# Patient Record
Sex: Male | Born: 1960
Health system: Southern US, Community
[De-identification: ages and names within clinical notes are randomized; demographics above are authoritative.]

## PROBLEM LIST (undated history)

## (undated) DIAGNOSIS — M1990 Unspecified osteoarthritis, unspecified site: Secondary | ICD-10-CM

## (undated) DIAGNOSIS — E119 Type 2 diabetes mellitus without complications: Secondary | ICD-10-CM

## (undated) DIAGNOSIS — F79 Unspecified intellectual disabilities: Secondary | ICD-10-CM

## (undated) DIAGNOSIS — F319 Bipolar disorder, unspecified: Secondary | ICD-10-CM

## (undated) DIAGNOSIS — S0990XA Unspecified injury of head, initial encounter: Secondary | ICD-10-CM

## (undated) DIAGNOSIS — F329 Major depressive disorder, single episode, unspecified: Secondary | ICD-10-CM

## (undated) DIAGNOSIS — F32A Depression, unspecified: Secondary | ICD-10-CM

## (undated) DIAGNOSIS — J189 Pneumonia, unspecified organism: Secondary | ICD-10-CM

## (undated) HISTORY — PX: BRAIN SURGERY: SHX531

## (undated) HISTORY — DX: Unspecified injury of head, initial encounter: S09.90XA

## (undated) HISTORY — PX: TRACHEOSTOMY: SUR1362

## (undated) HISTORY — PX: HERNIA REPAIR: SHX51

---

## 2004-01-03 ENCOUNTER — Emergency Department (HOSPITAL_COMMUNITY): Admission: EM | Admit: 2004-01-03 | Discharge: 2004-01-03 | Payer: Self-pay | Admitting: Emergency Medicine

## 2004-01-11 ENCOUNTER — Emergency Department (HOSPITAL_COMMUNITY): Admission: EM | Admit: 2004-01-11 | Discharge: 2004-01-11 | Payer: Self-pay | Admitting: Emergency Medicine

## 2004-01-14 ENCOUNTER — Emergency Department (HOSPITAL_COMMUNITY): Admission: EM | Admit: 2004-01-14 | Discharge: 2004-01-14 | Payer: Self-pay | Admitting: *Deleted

## 2004-01-18 ENCOUNTER — Emergency Department (HOSPITAL_COMMUNITY): Admission: EM | Admit: 2004-01-18 | Discharge: 2004-01-19 | Payer: Self-pay | Admitting: *Deleted

## 2004-02-04 ENCOUNTER — Emergency Department (HOSPITAL_COMMUNITY): Admission: EM | Admit: 2004-02-04 | Discharge: 2004-02-04 | Payer: Self-pay | Admitting: Emergency Medicine

## 2004-04-09 DIAGNOSIS — S0990XA Unspecified injury of head, initial encounter: Secondary | ICD-10-CM

## 2004-04-09 HISTORY — DX: Unspecified injury of head, initial encounter: S09.90XA

## 2004-11-08 ENCOUNTER — Emergency Department (HOSPITAL_COMMUNITY): Admission: EM | Admit: 2004-11-08 | Discharge: 2004-11-08 | Payer: Self-pay | Admitting: Emergency Medicine

## 2004-11-14 ENCOUNTER — Ambulatory Visit: Payer: Self-pay | Admitting: Psychiatry

## 2004-11-14 ENCOUNTER — Inpatient Hospital Stay (HOSPITAL_COMMUNITY): Admission: RE | Admit: 2004-11-14 | Discharge: 2004-11-17 | Payer: Self-pay | Admitting: Psychiatry

## 2004-11-26 ENCOUNTER — Emergency Department (HOSPITAL_COMMUNITY): Admission: EM | Admit: 2004-11-26 | Discharge: 2004-11-26 | Payer: Self-pay | Admitting: Emergency Medicine

## 2004-11-29 ENCOUNTER — Inpatient Hospital Stay (HOSPITAL_COMMUNITY): Admission: EM | Admit: 2004-11-29 | Discharge: 2004-11-30 | Payer: Self-pay | Admitting: Emergency Medicine

## 2004-12-06 ENCOUNTER — Inpatient Hospital Stay (HOSPITAL_COMMUNITY): Admission: RE | Admit: 2004-12-06 | Discharge: 2004-12-10 | Payer: Self-pay | Admitting: Orthopedic Surgery

## 2004-12-15 ENCOUNTER — Emergency Department (HOSPITAL_COMMUNITY): Admission: EM | Admit: 2004-12-15 | Discharge: 2004-12-15 | Payer: Self-pay | Admitting: Emergency Medicine

## 2004-12-22 ENCOUNTER — Emergency Department (HOSPITAL_COMMUNITY): Admission: EM | Admit: 2004-12-22 | Discharge: 2004-12-22 | Payer: Self-pay | Admitting: Emergency Medicine

## 2005-02-02 ENCOUNTER — Inpatient Hospital Stay (HOSPITAL_COMMUNITY): Admission: EM | Admit: 2005-02-02 | Discharge: 2005-02-16 | Payer: Self-pay | Admitting: Emergency Medicine

## 2005-03-20 ENCOUNTER — Inpatient Hospital Stay (HOSPITAL_COMMUNITY): Admission: AC | Admit: 2005-03-20 | Discharge: 2005-04-10 | Payer: Self-pay

## 2005-03-20 ENCOUNTER — Ambulatory Visit: Payer: Self-pay | Admitting: Physical Medicine & Rehabilitation

## 2005-03-28 ENCOUNTER — Ambulatory Visit: Payer: Self-pay | Admitting: Plastic Surgery

## 2005-04-10 ENCOUNTER — Ambulatory Visit: Payer: Self-pay | Admitting: Physical Medicine & Rehabilitation

## 2005-04-10 ENCOUNTER — Inpatient Hospital Stay (HOSPITAL_COMMUNITY)
Admission: RE | Admit: 2005-04-10 | Discharge: 2005-05-01 | Payer: Self-pay | Admitting: Physical Medicine & Rehabilitation

## 2005-05-01 ENCOUNTER — Inpatient Hospital Stay (HOSPITAL_COMMUNITY): Admission: AD | Admit: 2005-05-01 | Discharge: 2005-05-14 | Payer: Self-pay | Admitting: Plastic Surgery

## 2005-05-14 ENCOUNTER — Ambulatory Visit: Payer: Self-pay | Admitting: Physical Medicine & Rehabilitation

## 2005-05-14 ENCOUNTER — Inpatient Hospital Stay (HOSPITAL_COMMUNITY)
Admission: RE | Admit: 2005-05-14 | Discharge: 2005-05-24 | Payer: Self-pay | Admitting: Physical Medicine & Rehabilitation

## 2005-11-18 ENCOUNTER — Emergency Department (HOSPITAL_COMMUNITY): Admission: EM | Admit: 2005-11-18 | Discharge: 2005-11-18 | Payer: Self-pay | Admitting: Emergency Medicine

## 2005-11-19 ENCOUNTER — Emergency Department (HOSPITAL_COMMUNITY): Admission: EM | Admit: 2005-11-19 | Discharge: 2005-11-19 | Payer: Self-pay | Admitting: Emergency Medicine

## 2005-12-08 ENCOUNTER — Emergency Department (HOSPITAL_COMMUNITY): Admission: EM | Admit: 2005-12-08 | Discharge: 2005-12-08 | Payer: Self-pay | Admitting: Emergency Medicine

## 2006-06-29 ENCOUNTER — Emergency Department (HOSPITAL_COMMUNITY): Admission: EM | Admit: 2006-06-29 | Discharge: 2006-06-29 | Payer: Self-pay | Admitting: Emergency Medicine

## 2007-04-28 ENCOUNTER — Ambulatory Visit (HOSPITAL_COMMUNITY): Admission: RE | Admit: 2007-04-28 | Discharge: 2007-04-28 | Payer: Self-pay | Admitting: Internal Medicine

## 2007-05-05 ENCOUNTER — Emergency Department (HOSPITAL_COMMUNITY): Admission: EM | Admit: 2007-05-05 | Discharge: 2007-05-05 | Payer: Self-pay | Admitting: Emergency Medicine

## 2010-01-23 ENCOUNTER — Ambulatory Visit (HOSPITAL_COMMUNITY): Admission: RE | Admit: 2010-01-23 | Discharge: 2010-01-23 | Payer: Self-pay | Admitting: Internal Medicine

## 2010-01-23 IMAGING — CR DG CHEST 2V
3 series · 3 of 3 positions shown · non-contrast
Comparison: [DATE]

CLINICAL DATA: Cough, bronchitis.

CHEST - 2 VIEW

[view not recorded (1 of 3)]
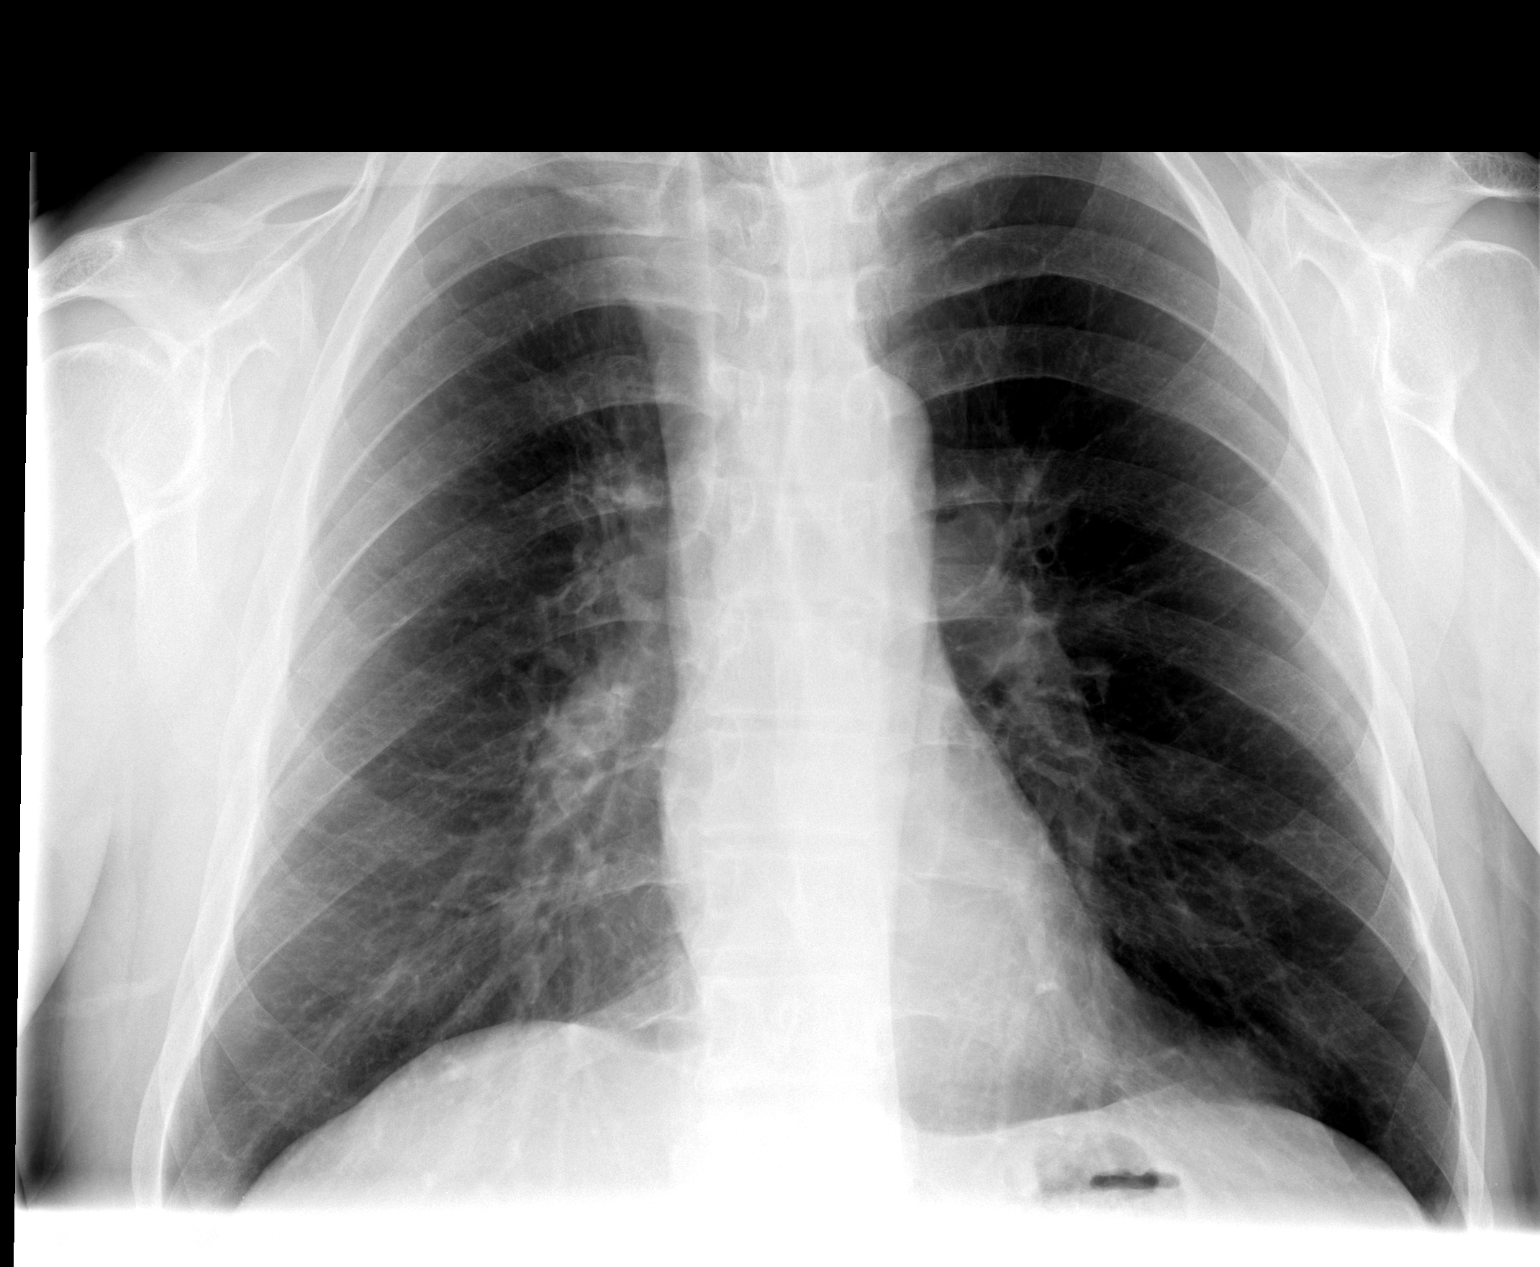

[view not recorded (2 of 3)]
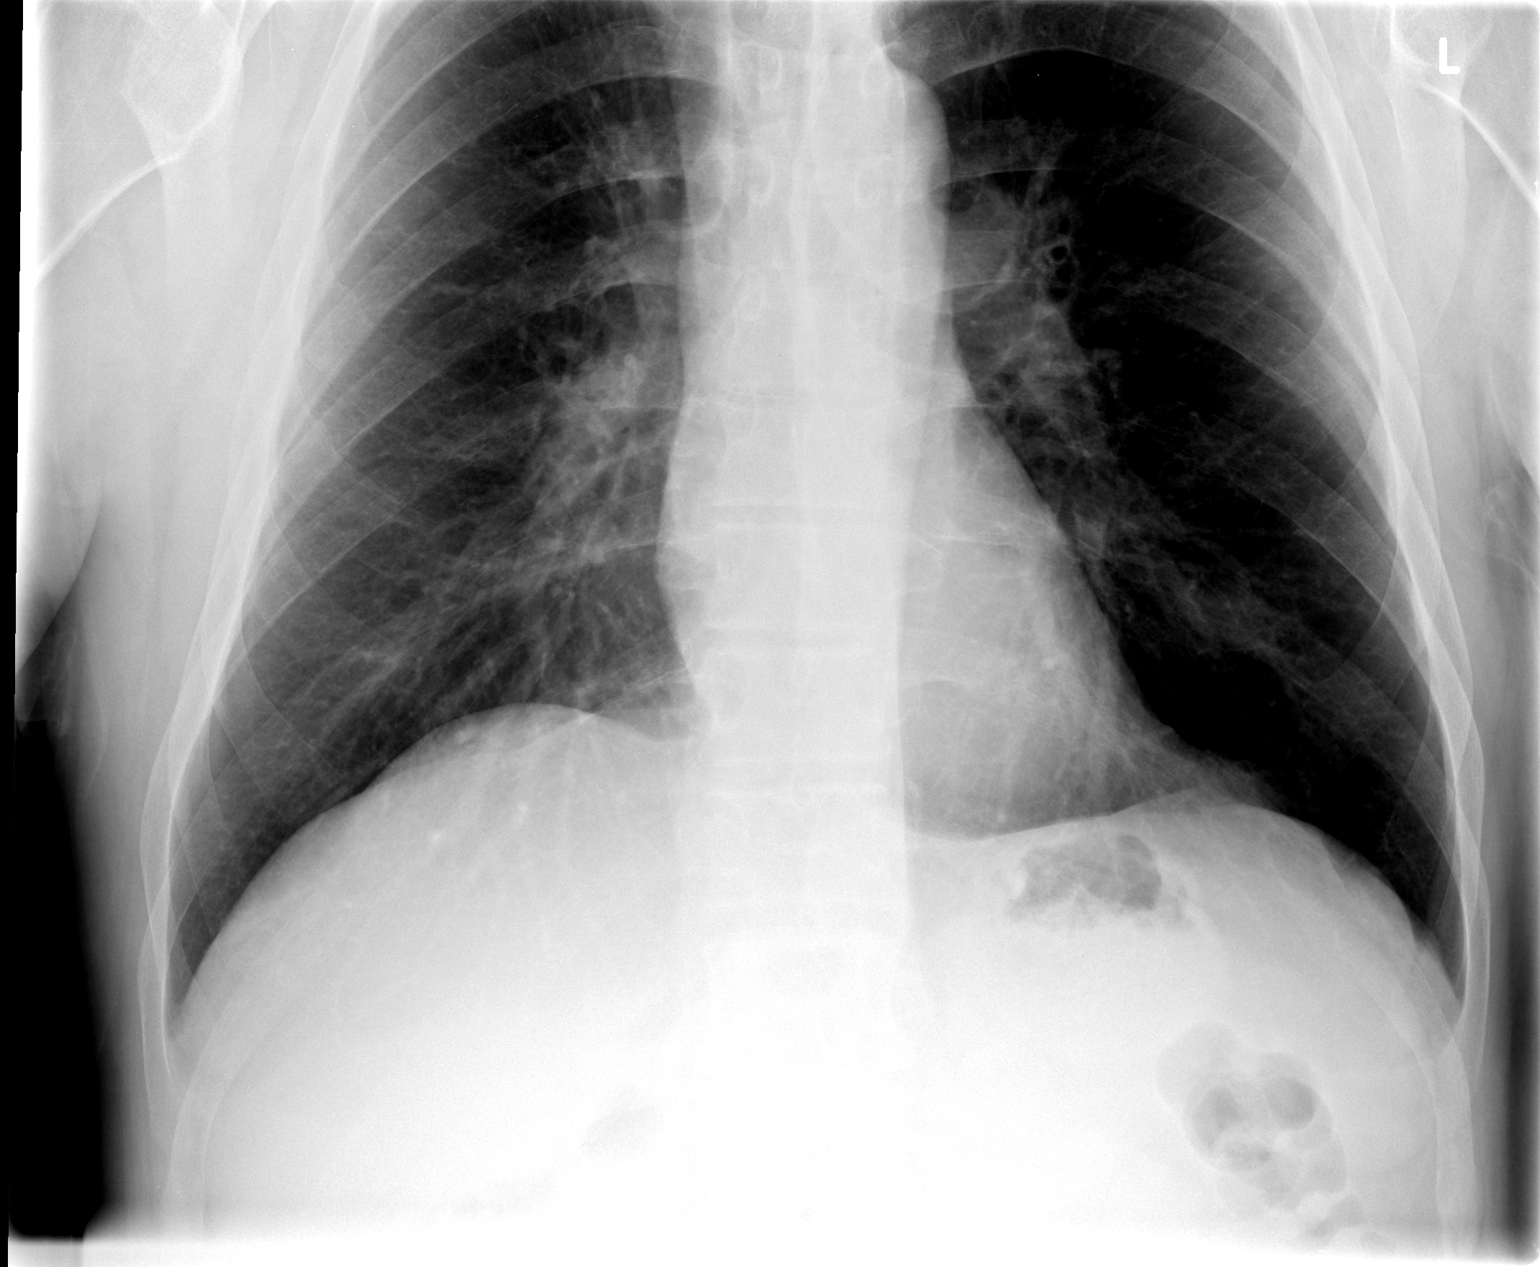

[view not recorded (3 of 3)]
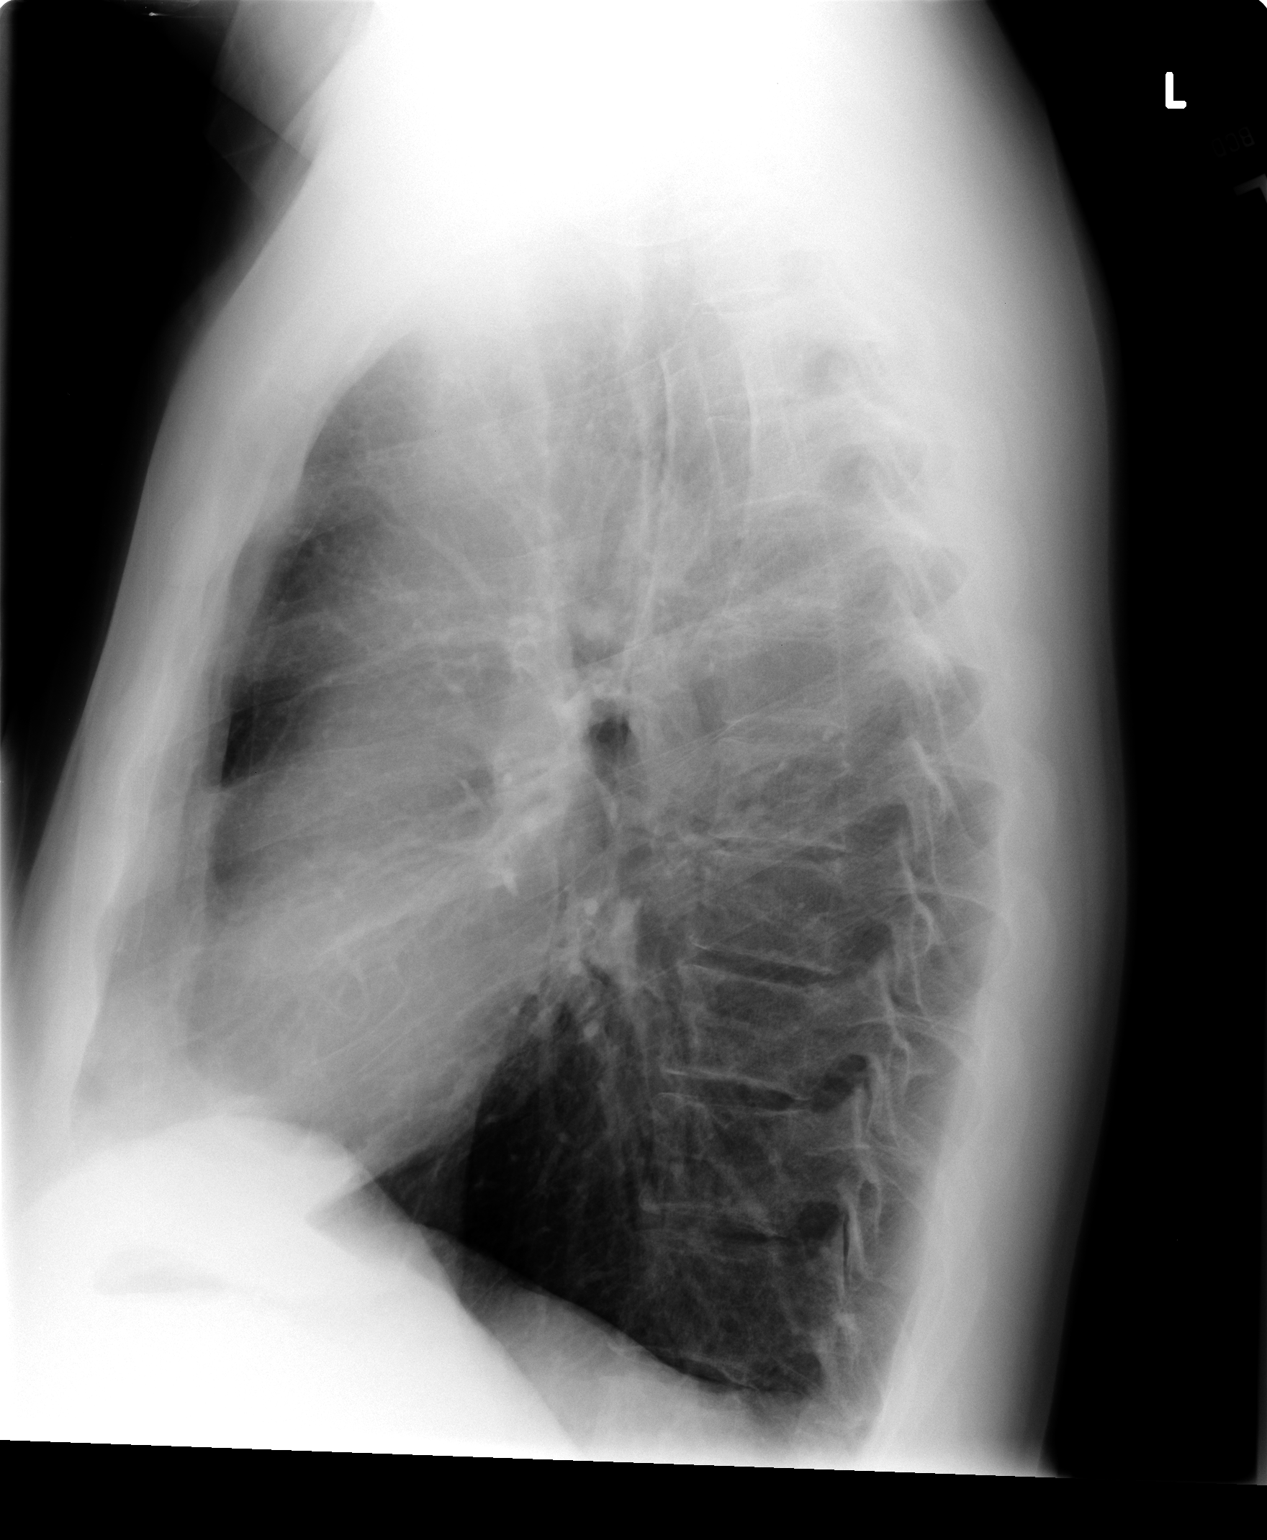

[3 of 3 positions shown; findings below may reference images not displayed]

FINDINGS: Tracheostomy tube no longer visualized.  Lungs are clear.
Heart is normal size.  No effusions or acute bony abnormality.
Scarring noted in the upper lobes bilaterally.
IMPRESSION: No active disease.  Biapical scarring.

## 2010-04-29 ENCOUNTER — Encounter: Payer: Self-pay | Admitting: Surgery

## 2010-04-30 ENCOUNTER — Encounter: Payer: Self-pay | Admitting: Physical Medicine & Rehabilitation
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
## 2010-08-25 NOTE — Op Note (Signed)
NAME:  Scott [name], Scott [name]               ACCOUNT NO.:  [id number]   MEDICAL RECORD NO.:  [id number]          PATIENT TYPE:  INP   LOCATION:  A320                          FACILITY:  [facility]   PHYSICIAN:  J. Darreld Mclean, M.D. DATE OF BIRTH:  [birth year]   DATE OF PROCEDURE:  02/09/2005  DATE OF DISCHARGE:                                 OPERATIVE REPORT   PREOPERATIVE DIAGNOSIS:  Infection, methicillin-resistant staphylococcus  aureus of the right prepatellar knee and right lateral thigh. Wound is  opened. Had been previously packed.   POSTOPERATIVE DIAGNOSIS:  Infection, methicillin-resistant staphylococcus  aureus of the right prepatellar knee and right lateral thigh. Wound is  opened. Had been previously packed.   PROCEDURE:  Irrigation and debridement, right knee prepatellar area and  right lateral thigh. Packing with Betadine-saturated gauze. This procedure  is staged procedure #3.   SURGEON:  J. Darreld Mclean, M.D.   ANESTHESIA:  Spinal.   No tourniquet.   This is the third procedure for methicillin-resistant infection. The patient  has a very serious infection to his right leg and prepatellar area of the  knee. It is prepatellar, it is not in the knee. The wounds today looked  improved. Probably will need one more time and then we can come and close it  next time. Had a very serious infection. He has progressed well with the  vancomycin. It does not appear to be spreading, and as stated, it appears to  look much better today.   DESCRIPTION OF PROCEDURE:  The patient was seen in the holding area.  Identified the right leg and knee as the correct surgical site. I placed a  mark on each site. The patient brought back to the operating room and given  spinal anesthesia. Was placed supine on the operating room table. Was  prepped and draped in the usual manner. Had a time out and again  reidentifying the patient, and then I took the packing that had previously  been removed, and we  used a water pick lavage system and irrigated, this  time only 3 liters of saline between the knee and the right lateral thigh.  The knee looked very well. Much of the redness had gone. We packed both  wounds with Betadine-saturated fine mesh gauze. The prepatellar area had not  as much put in, and I took large #5 Ethilon sutures and tried to bring back  some of the wound together as it had been separated for so long. Sterile  dressings applied. Bulky dressing applied. Ace bandage applied. Kerlix  applied and Ace.   Plan to bring him back on Monday and at that time hopefully we can close the  wounds. It all depends on how he does this weekend and how they look on  Monday. I have explained this to him and his wife.           ______________________________  Shela Commons. Darreld Mclean, M.D.     JWK/MEDQ  D:  02/09/2005  T:  02/09/2005  Job:  [id number]
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
## 2010-08-25 NOTE — Op Note (Signed)
NAME:  Scott [name], Scott [name]               ACCOUNT NO.:  [id number]   MEDICAL RECORD NO.:  [id number]          PATIENT TYPE:  INP   LOCATION:  A320                          FACILITY:  [facility]   PHYSICIAN:  J. Darreld Mclean, M.D. DATE OF BIRTH:  [birth year]   DATE OF PROCEDURE:  02/07/2005  DATE OF DISCHARGE:                                 OPERATIVE REPORT   PREOPERATIVE DIAGNOSIS:  Infection, right lateral thigh, right prepatellar  bursa.   POSTOPERATIVE DIAGNOSIS:  Infection, right lateral thigh, right prepatellar  bursa.   PROCEDURE:  1.  Staged procedure.  2.  Irrigation and debridement of infected areas of the right thigh and      prepatellar bursa, methicillin-resistant staphylococcus aureus.   ANESTHESIA:  Spinal.   SURGEON:  Dr. Hilda Lias.   DRAINS:  No drains.   Water pick was used.   INDICATIONS:  This is the second procedure for [name] for incision and  drainage of his wounds. There are not as red. They were cherry red the other  day, and they are not as red. We saw some discharge. There is still some  slight necrotic tissue present. Overall, is significantly improved and will  need further debridement. He is on IV vancomycin. Cultures have shown  methicillin-resistant staphylococcus aureus. Risks and imponderables of the  procedure were discussed with patient and his family, and they understood  and agreed.   DESCRIPTION OF PROCEDURE:  The patient was seen in the holding area.  Identified the right leg and knee as the correct surgical site. Marks were  placed by me and by the patient. He was brought back to the operating room  and given spinal anesthesia. He was placed supine. He was prepped and draped  in the usual manner. Another time out identified [name] as the patient and  the right leg as the correct surgical site. Previously Betadine impregnated  gauze rolls were removed and then he was irrigated with 6 liters of saline  with a water pick with peroxide in between  3 liter bags. Debridement was  carried out. Wounds were then repacked with fine mesh Betadine gauze.  Sterile dressing and bulky dressing applied. Wounds were of course left  open. I will bring him back on Friday for a third change and probable again  on Monday for the fourth, and maybe at that time, we can try to close the  wounds. He tolerated it well and will resume all other orders that he has  been on.           ______________________________  J. Darreld Mclean, M.D.     JWK/MEDQ  D:  02/07/2005  T:  02/07/2005  Job:  [id number]
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
## 2010-08-25 NOTE — Op Note (Signed)
NAME:  BRINDEN, KINCHELOE               ACCOUNT NO.:  [id number]   MEDICAL RECORD NO.:  [id number]           PATIENT TYPE:  INP   LOCATION:  A320                          FACILITY:  [facility]   PHYSICIAN:  J. Darreld Mclean, M.D. DATE OF BIRTH:  Oct 11, 1960   DATE OF PROCEDURE:  02/12/2005  DATE OF DISCHARGE:                                 OPERATIVE REPORT   PREOPERATIVE DIAGNOSIS:  Infection right lateral thigh, right prepatellar  area with methicillin-resistant Staphylococcus aureus.   POSTOPERATIVE DIAGNOSIS:  Infection right lateral thigh, right prepatellar  area with methicillin-resistant Staphylococcus aureus.   PROCEDURE:  Cleansing of wound and closure of wound right lateral thigh and  right prepatellar bursa area.   ANESTHESIA:  Spinal.   SURGEON:  J. Darreld Mclean, M.D.   INDICATIONS:  The patient is a 50 year old male who has had a significant  and severe infection of his right lateral thigh and prepatellar area which  has shown MRSA.  I have taken him to surgery 3 previous times for incision  and drainage, irrigation, and debridement.  Today I am bringing him back for  possible closure of the wound.  The risks and imponderables have been  discussed with the patient and his family.  He has been on IV vancomycin and  he has tolerated that well.  He has remained afebrile.  The wounds have  improved.   DESCRIPTION OF PROCEDURE:  The patient was seen in the holding area.  The  right thigh and knee were identified as the correct surgical sites.  I  placed a mark on these areas and so did the patient. The patient was brought  back to the operating room and given a spinal; then placed supine.  The  previously applied packings were removed.  The wounds looked very good  without any erythema or redness.  There was no odor.  The edges of the skin  looked good.  I elected, at this time, to close the wound.  It had been  prepped and draped.  I, again, reidentified the patient and the right  thigh  and knee as the correct surgical sites.   The wounds looked very good; and I elected to close the lateral thigh wound,  but first I cleaned it out and then I placed a Hemovac drain in and sewed it  with 2-0 silk.  The wound was reapproximated using 2-0 plain skin staples.  The patella was a jagged wound and I used some dental rolls with #2 Nylon  suture and then I did a running 3-0  Prolene suture for the skin edges.  There was one area of the skin on the  most lateral side that was jagged, and a separate wound and I closed this as  best as possible.  Sterile dressings were then applied.  The patient  tolerated the procedure well and went to recovery in good condition.           ______________________________  Shela Commons. Darreld Mclean, M.D.     JWK/MEDQ  D:  02/12/2005  T:  02/12/2005  Job:  [id number]

## 2010-08-25 NOTE — Discharge Summary (Signed)
NAME:  Scott [name], Scott [name]               ACCOUNT NO.:  [id number]   MEDICAL RECORD NO.:  [id number]          PATIENT TYPE:  INP   LOCATION:  A320                          FACILITY:  [facility]   PHYSICIAN:  J. Darreld Mclean, M.D. DATE OF BIRTH:  05-22-1960   DATE OF ADMISSION:  02/02/2005  DATE OF DISCHARGE:  11/10/2006LH                                 DISCHARGE SUMMARY   DISCHARGE DIAGNOSIS:  Cellulitis infection of the right prepatellar area and  the right lateral thigh.   CONDITION ON DISCHARGE:  Improved.  Prognosis good.   DISPOSITION:  To home.   DISCHARGE MEDICATIONS:  Vicodin ES one every 4 hours as needed for pain.   FOLLOW UP:  I will see him in the office on Monday, February 19, 2005.   WOUND CARE:  Keep the wound dry.   PROCEDURES:  Irrigation and drainage and irrigation and debridement of the  right prepatellar area and incision and drainage of right lateral thigh on  February 05, 2005.  These were repeated again on November 1, November 3 and  repeated again on November 6.   HISTORY OF PRESENT ILLNESS:  The patient was seen by me in the emergency  room on day of admission.  He is a 50 year old male who came in complaining  of pus coming out of his prepatellar area of his right knee.  It had been  coming out for several days.  The patient with serious, extremely poor,  unkempt hygiene and overall appearance.  He has a history of bipolar and  personality disorder and is seen in mental health by Dr. Thomasena Edis.  The  patient has multiple history of mental problems and has been hospitalized  several times at [facility].  The patient has Medicare and Social Security  Disability.  He had recently had a left lower knee surgery and seen by Dr.  Lequita Halt approximately 10 days to 2 weeks prior to this admission.   HOSPITAL COURSE:  The patient was admitted and begun on IV antibiotics.  It  was obvious that the wound needed debridement and would not improve.  He had  an elevated white  count and I had him seen by the hospitalist making sure he  was medically stable and he underwent the above-mentioned procedures.  The  first procedure was on October 30, which he tolerated well.  I packed the  wound and left it opened.  His antibiotics were controlled and regulated  with hospitalists and pharmacist.  He was taken back to surgery on November  1, and tolerated that well.  He was taken back to surgery on November 3, in  which he tolerated well.  The wound continued to improve.  He was taken back  on November 6, when it was much improved, looking much better.  He had a  Hemovac drain that was removed on November 8.  Arrangements were made for  discharge.  I had social services for adults to see him at home and arranged  home health.  Instructions and care were given.   LABORATORY DATA AND X-RAY FINDINGS:  Elevated  white count on admission and  slowly decreased by the time of discharge.   SPECIAL INSTRUCTIONS:  He was kept on his multiple medications for his  psychiatric condition.  He is to continue at home.  If there are any  difficulties, contact me through the office hospital beeper system.                                            ______________________________  Shela Commons. Darreld Mclean, M.D.     JWK/MEDQ  D:  03/12/2005  T:  03/12/2005  Job:  [id number]
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
## 2010-08-25 NOTE — Op Note (Signed)
NAME:  Scott [name], Scott [name]               ACCOUNT NO.:  [id number]   MEDICAL RECORD NO.:  [id number]          PATIENT TYPE:  INP   LOCATION:  A320                          FACILITY:  [facility]   PHYSICIAN:  J. Darreld Mclean, M.D. DATE OF BIRTH:  [birth year]   DATE OF PROCEDURE:  DATE OF DISCHARGE:                                 OPERATIVE REPORT   PREOPERATIVE DIAGNOSES:  1.  Septic right prepatellar bursa.  2.  Infection, right thigh, methicillin-resistant staphylococcus aureus      infection.   POSTOPERATIVE DIAGNOSES:  1.  Septic right prepatellar bursa.  2.  Infection, right thigh, methicillin-resistant staphylococcus aureus      infection.   PROCEDURE:  1.  Stage incision and drainage, irrigation and debridement, right      prepatellar bursa.  2.  Incision and drainage, right lateral thigh, packed open with Betadine      gauze.   SURGEON:  J. Darreld Mclean, M.D.   ANESTHESIA:  Spinal.   COMPLICATIONS:  None apparent.   BLOOD LOSS:  Minimal.   INDICATIONS:  Patient is a 50 year old male who presented to the ER with  infected prepatellar bursa that has been present for several days.  Has had  irrigation and debridement in the ER of that area.  Has since developed an  area on his lateral thigh.  Aspirated that.  It is purulent.  Cultures have  come back Staphylococcus aureus, methicillin-resistant.  Sensitive to  gentamicin, rifampin, and vancomycin.  Clindamycin cultures were not  available yet.   I have talked to the patient.  He has very, very poor hygiene.  I have  talked to his wife.  Recommended surgery.  I told him he is going to need  several procedures to get control of the infection.  This is the first of  several procedures of stage procedure.  He is at high risk.   Patient has psychiatric problems as well.   DESCRIPTION OF PROCEDURE:  The patient was seen in the holding area.  The  right knee and thigh were identified as the correct surgical sites.  Marks  were  placed on these.  The patient placed the marks.  The patient was  brought back to the operating room.  He was given spinal anesthesia.  Prepped and draped in the usual manner.  At the time of identifying the  patient as the correct patient and the right leg and knee as the correct  surgical site.  The leg was elevated.  It was prepped and draped.  I made an  incision over the prepatellar bursa and opened this up.  I took out a  tremendous amount of purulent material and just nasty, necrotic tissue.  I  used the water pick and used 3 liters of saline with the water pick and  irrigated this area out.  We packed the wound with fine mesh Betadine gauze.  Attention was directed to the lateral thigh.  He had a significant amount of  pus here, I would say a good 100 cc of purulent material, nasty, necrotic  tissue.  It  did not go past the fascial layers between the subcu and the  fascia of the tensor fascia lata.  I cleaned this area out, irrigated it,  and used a water pick.  Used 3 liters of saline.  I packed it with fine mesh  and Betadine gauze.  The wounds were left open.  Both of them were packed,  reinforced, and then had Kerlix and an Ace bandage applied loosely.  He will  be brought back to surgery on Wednesday for a repeat procedure.  He will  probably have another repeat procedure on Friday and maybe again next week.  Continue IV vancomycin.  Will continue the MRSA precautions.           ______________________________  Shela Commons. Darreld Mclean, M.D.     JWK/MEDQ  D:  02/05/2005  T:  02/05/2005  Job:  [id number]
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
## 2010-12-28 LAB — BASIC METABOLIC PANEL
BUN: 19
CO2: 25
Calcium: 8.4
Chloride: 101
Creatinine, Ser: 1.37
GFR calc non Af Amer: 56 — ABNORMAL LOW
Glucose, Bld: 108 — ABNORMAL HIGH
Potassium: 3.9

## 2010-12-28 LAB — DIFFERENTIAL
Eosinophils Relative: 0
Lymphocytes Relative: 27
Monocytes Absolute: 0.7

## 2010-12-28 LAB — URINALYSIS, ROUTINE W REFLEX MICROSCOPIC
Bilirubin Urine: NEGATIVE
Glucose, UA: NEGATIVE
Leukocytes, UA: NEGATIVE
Specific Gravity, Urine: 1.025
Urobilinogen, UA: 0.2
pH: 6

## 2010-12-28 LAB — CBC
Hemoglobin: 13.2
MCV: 92.4
RBC: 4.09 — ABNORMAL LOW
RDW: 14.8

## 2011-04-10 DIAGNOSIS — J189 Pneumonia, unspecified organism: Secondary | ICD-10-CM

## 2011-04-10 HISTORY — DX: Pneumonia, unspecified organism: J18.9

## 2011-07-17 ENCOUNTER — Encounter (HOSPITAL_COMMUNITY): Payer: Self-pay | Admitting: General Practice

## 2011-07-17 ENCOUNTER — Inpatient Hospital Stay (HOSPITAL_COMMUNITY)
Admission: AD | Admit: 2011-07-17 | Discharge: 2011-07-23 | DRG: 195 | Disposition: A | Discharge status: Nursing Home (Includes ICF) | Payer: Medicare Other | Source: Ambulatory Visit | Attending: Internal Medicine | Admitting: Internal Medicine

## 2011-07-17 ENCOUNTER — Other Ambulatory Visit (HOSPITAL_COMMUNITY): Payer: Self-pay | Admitting: Internal Medicine

## 2011-07-17 ENCOUNTER — Ambulatory Visit (HOSPITAL_COMMUNITY)
Admission: RE | Admit: 2011-07-17 | Discharge: 2011-07-17 | Disposition: A | Discharge status: Home / Self Care | Payer: Medicare Other | Source: Ambulatory Visit | Attending: Internal Medicine | Admitting: Internal Medicine

## 2011-07-17 DIAGNOSIS — F79 Unspecified intellectual disabilities: Secondary | ICD-10-CM | POA: Diagnosis present

## 2011-07-17 DIAGNOSIS — J40 Bronchitis, not specified as acute or chronic: Secondary | ICD-10-CM

## 2011-07-17 DIAGNOSIS — F17200 Nicotine dependence, unspecified, uncomplicated: Secondary | ICD-10-CM | POA: Diagnosis present

## 2011-07-17 DIAGNOSIS — J449 Chronic obstructive pulmonary disease, unspecified: Secondary | ICD-10-CM | POA: Diagnosis present

## 2011-07-17 DIAGNOSIS — Z7982 Long term (current) use of aspirin: Secondary | ICD-10-CM

## 2011-07-17 DIAGNOSIS — M1990 Unspecified osteoarthritis, unspecified site: Secondary | ICD-10-CM | POA: Diagnosis present

## 2011-07-17 DIAGNOSIS — J4489 Other specified chronic obstructive pulmonary disease: Secondary | ICD-10-CM | POA: Diagnosis present

## 2011-07-17 DIAGNOSIS — Z79899 Other long term (current) drug therapy: Secondary | ICD-10-CM

## 2011-07-17 DIAGNOSIS — J189 Pneumonia, unspecified organism: Principal | ICD-10-CM | POA: Diagnosis present

## 2011-07-17 DIAGNOSIS — F319 Bipolar disorder, unspecified: Secondary | ICD-10-CM | POA: Diagnosis present

## 2011-07-17 HISTORY — DX: Depression, unspecified: F32.A

## 2011-07-17 HISTORY — DX: Unspecified osteoarthritis, unspecified site: M19.90

## 2011-07-17 HISTORY — DX: Bipolar disorder, unspecified: F31.9

## 2011-07-17 HISTORY — DX: Unspecified intellectual disabilities: F79

## 2011-07-17 HISTORY — DX: Major depressive disorder, single episode, unspecified: F32.9

## 2011-07-17 LAB — URINALYSIS, ROUTINE W REFLEX MICROSCOPIC
Glucose, UA: 250 mg/dL — AB
Leukocytes, UA: NEGATIVE
pH: 6 (ref 5.0–8.0)

## 2011-07-17 LAB — CBC
Hemoglobin: 13.6 g/dL (ref 13.0–17.0)
MCV: 95.8 fL (ref 78.0–100.0)
Platelets: 143 10*3/uL — ABNORMAL LOW (ref 150–400)
RBC: 4.26 MIL/uL (ref 4.22–5.81)
WBC: 5.1 10*3/uL (ref 4.0–10.5)

## 2011-07-17 LAB — BASIC METABOLIC PANEL
CO2: 25 mEq/L (ref 19–32)
Chloride: 102 mEq/L (ref 96–112)
Creatinine, Ser: 1.1 mg/dL (ref 0.50–1.35)
Glucose, Bld: 138 mg/dL — ABNORMAL HIGH (ref 70–99)
Sodium: 138 mEq/L (ref 135–145)

## 2011-07-17 LAB — URINE MICROSCOPIC-ADD ON

## 2011-07-17 MED ORDER — DEXTROSE 5 % IV SOLN
1.0000 g | INTRAVENOUS | Status: DC
  Administered 2011-07-17 – 2011-07-22 (×6): 1 g via INTRAVENOUS
  Filled 2011-07-17 (×8): qty 10

## 2011-07-17 MED ORDER — NIACIN ER 500 MG PO TBCR
500.0000 mg | EXTENDED_RELEASE_TABLET | Freq: Every day | ORAL | Status: DC
  Administered 2011-07-17 – 2011-07-22 (×6): 500 mg via ORAL
  Filled 2011-07-17 (×8): qty 1

## 2011-07-17 MED ORDER — DIVALPROEX SODIUM ER 500 MG PO TB24
500.0000 mg | ORAL_TABLET | Freq: Every day | ORAL | Status: DC
  Administered 2011-07-18 – 2011-07-23 (×6): 500 mg via ORAL
  Filled 2011-07-17 (×6): qty 1

## 2011-07-17 MED ORDER — ALBUTEROL SULFATE (5 MG/ML) 0.5% IN NEBU
2.5000 mg | INHALATION_SOLUTION | RESPIRATORY_TRACT | Status: DC
  Administered 2011-07-17 – 2011-07-18 (×5): 2.5 mg via RESPIRATORY_TRACT
  Filled 2011-07-17 (×5): qty 0.5

## 2011-07-17 MED ORDER — ENOXAPARIN SODIUM 40 MG/0.4ML ~~LOC~~ SOLN
40.0000 mg | SUBCUTANEOUS | Status: DC
  Administered 2011-07-17 – 2011-07-22 (×6): 40 mg via SUBCUTANEOUS
  Filled 2011-07-17 (×7): qty 0.4

## 2011-07-17 MED ORDER — ASPIRIN EC 81 MG PO TBEC
81.0000 mg | DELAYED_RELEASE_TABLET | Freq: Every day | ORAL | Status: DC
  Administered 2011-07-18 – 2011-07-23 (×6): 81 mg via ORAL
  Filled 2011-07-17 (×6): qty 1

## 2011-07-17 MED ORDER — IPRATROPIUM BROMIDE 0.02 % IN SOLN
0.5000 mg | RESPIRATORY_TRACT | Status: DC
  Administered 2011-07-17 – 2011-07-18 (×5): 0.5 mg via RESPIRATORY_TRACT
  Filled 2011-07-17 (×5): qty 2.5

## 2011-07-17 MED ORDER — RISPERIDONE 1 MG PO TABS
1.0000 mg | ORAL_TABLET | Freq: Every day | ORAL | Status: DC
  Administered 2011-07-17 – 2011-07-22 (×6): 1 mg via ORAL
  Filled 2011-07-17 (×6): qty 1

## 2011-07-17 MED ORDER — DEXTROSE 5 % IV SOLN
500.0000 mg | INTRAVENOUS | Status: DC
  Administered 2011-07-17 – 2011-07-20 (×4): 500 mg via INTRAVENOUS
  Filled 2011-07-17 (×7): qty 500

## 2011-07-18 MED ORDER — ALBUTEROL SULFATE (5 MG/ML) 0.5% IN NEBU
2.5000 mg | INHALATION_SOLUTION | Freq: Three times a day (TID) | RESPIRATORY_TRACT | Status: DC
  Administered 2011-07-18 – 2011-07-23 (×14): 2.5 mg via RESPIRATORY_TRACT
  Filled 2011-07-18 (×15): qty 0.5

## 2011-07-18 MED ORDER — ALBUTEROL SULFATE (5 MG/ML) 0.5% IN NEBU: 2.5000 mg | INHALATION_SOLUTION | RESPIRATORY_TRACT | Status: DC

## 2011-07-18 MED ORDER — IPRATROPIUM BROMIDE 0.02 % IN SOLN
0.5000 mg | Freq: Three times a day (TID) | RESPIRATORY_TRACT | Status: DC
  Administered 2011-07-18 – 2011-07-23 (×14): 0.5 mg via RESPIRATORY_TRACT
  Filled 2011-07-18 (×14): qty 2.5

## 2011-07-18 MED ORDER — IPRATROPIUM BROMIDE 0.02 % IN SOLN: 0.5000 mg | Freq: Three times a day (TID) | RESPIRATORY_TRACT | Status: DC

## 2011-07-18 NOTE — Progress Notes (Signed)
UR Chart Review Completed  

## 2011-07-18 NOTE — Progress Notes (Signed)
Scott [name], Scott [name]               ACCOUNT NO.:  [id number]  MEDICAL RECORD NO.:  [id number]  LOCATION:  A341                          FACILITY:  [facility]  PHYSICIAN:  Ceola Para D. Felecia Shelling, MD   DATE OF BIRTH:  Feb 23, 1961  DATE OF PROCEDURE:  07/18/2011 DATE OF DISCHARGE:                                PROGRESS NOTE   SUBJECTIVE:  The patient feels slightly better today.  His cough and congestion is less.  No fever or chills.  OBJECTIVE:  GENERAL:  The patient is alert, awake, and sick looking. VITAL SIGNS:  Blood pressure 108/72, pulse 94, respiratory rate 16, temperature 98.3 degrees Fahrenheit. CHEST:  Poor air entry bilaterally, expiratory wheezes and rhonchi. CARDIOVASCULAR SYSTEM:  First and second heart sound heard.  No murmur. No gallop. ABDOMEN:  Soft and lax.  Bowel sound is positive.  No mass or organomegaly.  EXTREMITIES:  No leg edema.  ASSESSMENT: 1. Community-acquired pneumonia. 2. Possibly chronic obstructive pulmonary disease. 3. Bipolar disease.  PLAN:  We will continue the patient on IV antibiotics.  Continue nebulizer treatment.  Continue current treatment and supportive care.     Noelie Renfrow D. Felecia Shelling, MD     TDF/MEDQ  D:  07/18/2011  T:  07/18/2011  Job:  [id number]

## 2011-07-18 NOTE — H&P (Signed)
Scott [name], Scott [name]               ACCOUNT NO.:  [id number]  MEDICAL RECORD NO.:  [id number]  LOCATION:  A341                          FACILITY:  [facility]  PHYSICIAN:  Mantaj Chamberlin D. Felecia Shelling, MD   DATE OF BIRTH:  [birth year]  DATE OF ADMISSION:  07/17/2011 DATE OF DISCHARGE:  LH                             HISTORY & PHYSICAL   CHIEF COMPLAINT:  Cough, congestion, and shortness of breath.  HISTORY OF PRESENT ILLNESS:  This is a 51 year old male patient with history of mental retardation, bipolar disease, and depression disorder. We is currently a resident of [facility], brought to my office due to cough, congestion, and shortness of breath.  The patient smokes about 1 pack of cigarettes daily.  During the evaluation, the patient was found to have expiratory wheezes and poor air entry.  Chest x-ray was ordered which showed patchy infiltrates with a dense right upper lobe consolidation.  The patient was then directly admitted as a case of community-acquired pneumonia and possible acute exacerbation of chronic obstructive pulmonary disease.  He is started on IV antibiotics and nebulizer treatment.  REVIEW OF SYSTEMS:  The patient feels weak and his appetite is poor.  He had no fever or chills.  No chest pain, nausea, vomiting, abdominal pain, dysuria, urgency or frequency of urination.  PAST MEDICAL HISTORY: 1. Mental retardation. 2. Bipolar disease. 3. Depression disorder. 4. Osteoarthritis.  CURRENT MEDICATIONS: 1. Aspirin 81 mg daily. 2. Depakote 500 mg daily. 3. Niaspan 500 mg daily. 4. Risperdal 1 mg daily. 5. Albuterol inhaler daily. 6. Robitussin cough syrup p.r.n.  SOCIAL HISTORY:  The patient is currently a resident of [facility].  He smokes about 1 pack of cigarettes daily.  No history of alcohol or substance abuse.  FAMILY HISTORY:  Not available at this time.  PHYSICAL EXAMINATION:  GENERAL:  The patient is alert, awake, and acutely sick  looking. VITAL SIGNS:  Blood pressure 122/76, pulse 101, respiratory rate 22, temperature 98.9 degrees Fahrenheit. HEENT:  Pupils are equal and reactive. NECK:  Supple. CHEST:  Poor air entry.  Bilateral expiratory wheezes and rhonchi. CARDIOVASCULAR:  First and second heart sounds heard.  No murmur.  No gallop. ABDOMEN:  Soft and lax.  Bowel sound is positive.  No mass or organomegaly. EXTREMITIES:  No leg edema.  LABORATORY DATA:  WBC 5.4, hemoglobin 13.6, hematocrit 40.8, and platelet 143.  BMP:  Sodium 138, potassium 4.0, chloride 102, carbon dioxide 25, glucose 138, BUN 20, creatinine 1.0, and calcium 9.3.  ASSESSMENT: 1. Community-acquired pneumonia. 2. Chronic obstructive pulmonary disease. 3. History of tobacco abuse. 4. Bipolar disease. 5. History of depression disorder.  PLAN:  We will continue the patient on combination IV antibiotics.  We will continue nebulizer treatment.  Continue regular medications.     Nannette Zill D. Felecia Shelling, MD     TDF/MEDQ  D:  07/18/2011  T:  07/18/2011  Job:  [id number]

## 2011-07-19 NOTE — Progress Notes (Signed)
   CARE MANAGEMENT NOTE 07/19/2011  Patient:  SIR, MALLIS   Account Number:  [id number]  Date Initiated:  07/19/2011  Documentation initiated by:  Sharrie Rothman  Subjective/Objective Assessment:   Pt admitted from [facility]. Pt has a guardian at [facility] DSS- Cecilio Asper. Pt will return to [facility].     Action/Plan:   CM will monitor for any HH or discharge needs. CSW is aware that pt is here and will arrange discharge back to facility when pt is medically ready for discharge.   Anticipated DC Date:  07/24/2011   Anticipated DC Plan:  ASSISTED LIVING / REST HOME  In-house referral  Clinical Social Worker      DC Planning Services  CM consult      Choice offered to / List presented to:             Status of service:  In process, will continue to follow Medicare Important Message given?   (If response is "NO", the following Medicare IM given date fields will be blank) Date Medicare IM given:   Date Additional Medicare IM given:    Discharge Disposition:  ASSISTED LIVING  Per UR Regulation:    If discussed at Long Length of Stay Meetings, dates discussed:    Comments:  07/19/11 1406 Arlyss Queen, RN BSN CM

## 2011-07-19 NOTE — Progress Notes (Signed)
NAMETYRION, GLAUDE               ACCOUNT NO.:  [id number]  MEDICAL RECORD NO.:  [id number]  LOCATION:                                 FACILITY:  PHYSICIAN:  Parneet Glantz D. Felecia Shelling, MD   DATE OF BIRTH:  Oct 18, 1960  DATE OF PROCEDURE:  07/19/2011 DATE OF DISCHARGE:                                PROGRESS NOTE   SUBJECTIVE:  The patient is feeling slightly better.  His cough and congestion is improving.  No fever or chills.  OBJECTIVE:  GENERAL:  The patient is alert, awake, and chronically sick looking. VITAL SIGNS:  Blood pressure 95/62, pulse 86, respiratory rate 20, temperature 97 degrees Fahrenheit. CHEST:  Poor air entry, bilateral rhonchi and crackles. CARDIOVASCULAR SYSTEM:  First and second heart sounds heard.  No murmur. No gallop. ABDOMEN:  Soft and lax.  Bowel sound is positive.  No mass or organomegaly. EXTREMITIES:  No leg edema.  ASSESSMENT: 1. Community-acquired pneumonia. 2. Chronic obstructive pulmonary disease. 3. History of tobacco abuse. 4. Bipolar disease.  PLAN:  We will continue the patient on IV antibiotics.  Continue nebulizer treatment.  Continue current treatment and supportive care.     Natale Barba D. Felecia Shelling, MD     TDF/MEDQ  D:  07/19/2011  T:  07/19/2011  Job:  [id number]

## 2011-07-20 LAB — CBC
HCT: 40.8 % (ref 39.0–52.0)
Hemoglobin: 13.4 g/dL (ref 13.0–17.0)
RBC: 4.23 MIL/uL (ref 4.22–5.81)
RDW: 13.4 % (ref 11.5–15.5)
WBC: 6.2 10*3/uL (ref 4.0–10.5)

## 2011-07-20 LAB — BASIC METABOLIC PANEL
Chloride: 108 mEq/L (ref 96–112)
Creatinine, Ser: 0.81 mg/dL (ref 0.50–1.35)
GFR calc Af Amer: >90 mL/min (ref >90)
Potassium: 4.6 mEq/L (ref 3.5–5.1)
Sodium: 142 mEq/L (ref 135–145)

## 2011-07-20 NOTE — Progress Notes (Signed)
NAMEKHAMARION, BJELLAND               ACCOUNT NO.:  [id number]  MEDICAL RECORD NO.:  [id number]  LOCATION:  A341                          FACILITY:  [facility]  PHYSICIAN:  Race Latour D. Felecia Shelling, MD   DATE OF BIRTH:  [birth year]  DATE OF PROCEDURE:  07/20/2011 DATE OF DISCHARGE:                                PROGRESS NOTE   SUBJECTIVE:  The patient is still coughing and wheezing.  However, he is feeling better than before.  No fever or chills.  OBJECTIVE:  GENERAL:  The patient is alert, awake, and sick looking. VITAL SIGNS:  Blood pressure 125/86 pulse 85, respiratory rate 20, temperature 97.5 degrees Fahrenheit. CHEST:  Decreased air entry, few rhonchi. CARDIOVASCULAR SYSTEM:  First and second heart sounds heard.  No murmur. No gallop. ABDOMEN:  Soft and lax.  Bowel sound is positive.  No mass or organomegaly. EXTREMITIES:  No leg edema.  LABS:  Sodium 142, potassium 4.6, chloride 108, carbon dioxide 26, glucose 123, BUN 11, creatinine 0.81, calcium 9.2.  CBC:  WBC 6.2, hemoglobin 13.4, hematocrit 40.8, and platelets 176.  ASSESSMENT: 1. Community-acquired pneumonia. 2. Chronic obstructive pulmonary disease. 3. Mental retardation. 4. Bipolar disease.  PLAN:  We will continue the patient on IV antibiotics.  Continue nebulizer treatment.  Continue current treatment and supportive care.     Huie Ghuman D. Felecia Shelling, MD     TDF/MEDQ  D:  07/20/2011  T:  07/20/2011  Job:  [id number]

## 2011-07-20 NOTE — Progress Notes (Signed)
Clinical Social Work Department BRIEF PSYCHOSOCIAL ASSESSMENT 07/20/2011  Patient:  Scott [name], Scott [name]     Account Number:  [id number]     Admit date:  07/17/2011  Clinical Social Worker:  Robin Searing  Date/Time:  07/20/2011 12:20 PM  Referred by:  Physician  Date Referred:  07/19/2011 Referred for  ALF Placement   Other Referral:   Interview type:  Other - See comment Other interview type:    PSYCHOSOCIAL DATA Living Status:  FACILITY Admitted from facility:  [facility] Level of care:  Assisted Living Primary support name:  guardian/DSS Primary support relationship to patient:   Degree of support available:    CURRENT CONCERNS Current Concerns  Post-Acute Placement   Other Concerns:    SOCIAL WORK ASSESSMENT / PLAN Patient admitted from [facility] ALF- anticipate return at d/c.   Assessment/plan status:  Other - See comment Other assessment/ plan:   Patient admitted from [facility]F- anticipate return at d/c.   Information/referral to community resources:    PATIENT'S/FAMILY'S RESPONSE TO PLAN OF CARE: Guardian/DSS agree with plans for return to ALF at d/c.      Reece Levy, MSW, LCSWA [phone]

## 2011-07-21 MED ORDER — AZITHROMYCIN 250 MG PO TABS
500.0000 mg | ORAL_TABLET | Freq: Every day | ORAL | Status: DC
  Administered 2011-07-21 – 2011-07-23 (×3): 500 mg via ORAL
  Filled 2011-07-21 (×3): qty 2

## 2011-07-21 NOTE — Progress Notes (Signed)
PHARMACIST - PHYSICIAN COMMUNICATION DR:   Felecia Shelling CONCERNING: Antibiotic IV to Oral Route Change Policy  RECOMMENDATION: This patient is receiving Zithromax by the intravenous route.  Based on criteria approved by the Pharmacy and Therapeutics Committee, the antibiotic(s) is/are being converted to the equivalent oral dose form(s).   DESCRIPTION: These criteria include:  Patient being treated for a respiratory tract infection, urinary tract infection, or cellulitis  The patient is not neutropenic and does not exhibit a GI malabsorption state  The patient is eating (either orally or via tube) and/or has been taking other orally administered medications for a least 24 hours  The patient is improving clinically and has a Tmax < 100.5  If you have questions about this conversion, please contact the Pharmacy Department  [x]   [phone] )  [facility] []   [phone] )  [facility]  []   [phone] )  [facility] []   [phone] )  [facility]  Mady Gemma, [facility]  07/21/2011 2:11 PM

## 2011-07-22 NOTE — Progress Notes (Signed)
NAMEHENRIQUE, PAREKH               ACCOUNT NO.:  [id number]  MEDICAL RECORD NO.:  [id number]  LOCATION:  A341                          FACILITY:  [facility]  PHYSICIAN:  Braxxton Stoudt L. Juanetta Gosling, M.D.DATE OF BIRTH:  25-May-1960  DATE OF PROCEDURE: DATE OF DISCHARGE:                                PROGRESS NOTE   The patient of Dr. Felecia Shelling.  Mr. Scott [name] is admitted with pneumonia/COPD.  He also has mental retardation.  He seems to be better and says he feels better.  He has no other new complaints.  Exam shows that he is awake and alert.  He looks relatively comfortable.  His chest is with some rhonchi.  Heart is regular.  Abdomen is soft.  Extremities showed no edema.  Central nervous system exam is grossly intact.  Assessment then is that he is better.  He seems to be improving on a daily basis.  PLAN:  No change in treatment.  He will continue his current meds and follow.  I do not think he is quite ready for discharge yet.     Jekhi Bolin L. Juanetta Gosling, M.D.     ELH/MEDQ  D:  07/21/2011  T:  07/22/2011  Job:  [id number]

## 2011-07-22 NOTE — Progress Notes (Signed)
Per MD note- patient is not yet ready for d/c. Plan return to [facility] when stable per MD. Signed Fl2 placed in front of chart.   Darylene Price, BSW,07/22/2011 11:16 AM

## 2011-07-23 LAB — CULTURE, BLOOD (ROUTINE X 2): Culture: NO GROWTH

## 2011-07-23 MED ORDER — AMOXICILLIN-POT CLAVULANATE 500-125 MG PO TABS: 1.0000 | ORAL_TABLET | Freq: Three times a day (TID) | ORAL | Status: AC

## 2011-07-23 NOTE — Progress Notes (Signed)
[facility] staff here to pick up patient.  Assisted patient to door by nurse tech.  Schonewitz, Candelaria Stagers 07/23/2011

## 2011-07-23 NOTE — Progress Notes (Signed)
Clinical Social Work:  Following for patient who is from an ALF and will return today.  Spoke with Lupita Leash at [facility] [employer].  Agreeable for patient to return today and will pick patient up/provide transportation between 12:30-1:00pm.  Patient aware of discharge and also spoke with patient's guardian: Juliette Alcide who was made aware of discharge and was agreeable.  No other needs at this time. Will sign off.  Ashley Jacobs, MSW LCSW [phone]

## 2011-07-23 NOTE — Progress Notes (Signed)
NAMEMATTHAN, SLEDGE               ACCOUNT NO.:  [id number]  MEDICAL RECORD NO.:  [id number]  LOCATION:                                 FACILITY:  PHYSICIAN:  Bindu Docter L. Juanetta Gosling, M.D.DATE OF BIRTH:  06-16-1960  DATE OF PROCEDURE:  07/22/2011 DATE OF DISCHARGE:                                PROGRESS NOTE   Patient of Dr. Letitia Neri.  Scott [name] has pneumonia and has mental retardation.  He seems to be doing better.  He has no new complaints.  PHYSICAL EXAMINATION:  GENERAL:  He is awake and alert, looks comfortable. CHEST:  Much clearer. HEART:  Regular. ABDOMEN:  Soft. EXTREMITIES:  No edema.  ASSESSMENT:  He is improving.  PLAN:  I suspect he will probably be able to go home tomorrow.  I did not change any of his medicines today.     Dede Dobesh L. Juanetta Gosling, M.D.     ELH/MEDQ  D:  07/22/2011  T:  07/23/2011  Job:  [id number]

## 2011-07-23 NOTE — Discharge Summary (Signed)
Scott [name], Scott [name]               ACCOUNT NO.:  [id number]  MEDICAL RECORD NO.:  [id number]  LOCATION:  A340                          FACILITY:  [facility]  PHYSICIAN:  Caprisha Bridgett D. Felecia Shelling, MD   DATE OF BIRTH:  [birth year]  DATE OF ADMISSION:  07/17/2011 DATE OF DISCHARGE:  04/15/2013LH                              DISCHARGE SUMMARY   DISCHARGE DIAGNOSES: 1. Community-acquired pneumonia. 2. Chronic obstructive pulmonary disease. 3. History of bipolar disease. 4. History of tobacco abuse. 5. Depression disorder.  DISCHARGE MEDICATIONS: 1. Augmentin 500 mg p.o. t.i.d. for 5 more days. 2. Albuterol inhaler 2 puffs q.4 hours p.r.n. 3. Aspirin 81 mg daily. 4. Depakote 500 mg b.i.d. 5. Niacin 500 mg b.i.d. 6. Risperidone 1 mg daily.  DISPOSITION:  The patient will be discharged back to [facility].  DISCHARGE INSTRUCTION:  The patient will continue oral antibiotics.  He will be followed in the office in 1-week duration.  LABORATORY DATA ON DISCHARGE:  BMP:  Sodium 142, potassium 4.6 chloride 108, carbon dioxide 26, sodium 123, BUN 11, creatinine 0.8, and calcium 8.2.  CBC:  WBC 6.2, hemoglobin 13.4, hematocrit 40.8, and platelets 176.  HOSPITAL COURSE:  This is a 51 year old male patient who was a resident of [facility], was admitted due to pneumonia.  He was seen in the office due to cough and congestion.  His chest x-ray showed consolidation of his right lung.  He was also known case of bronchitis with ongoing tobacco use.  He was also treated as superimposed to chronic obstructive pulmonary disease.  The patient received a combination of IV antibiotics.  Over the hospital stay, the patient improved.  His respiratory symptoms have resolved. The patient will be discharged to rest home on oral antibiotics and will be followed in the office in 1-week duration.     Khoury Siemon D. Felecia Shelling, MD     TDF/MEDQ  D:  07/23/2011  T:  07/23/2011  Job:  [id number]

## 2011-07-23 NOTE — Progress Notes (Signed)
   CARE MANAGEMENT NOTE 07/23/2011  Patient:  Scott [name], Scott [name]   Account Number:  [id number]  Date Initiated:  07/19/2011  Documentation initiated by:  Sharrie Rothman  Subjective/Objective Assessment:   Pt admitted from [facility]. Pt has a guardian at [facility] DSS- Cecilio Asper. Pt will return to [facility].     Action/Plan:   CM will monitor for any HH or discharge needs. CSW is aware that pt is here and will arrange discharge back to facility when pt is medically ready for discharge.   Anticipated DC Date:  07/24/2011   Anticipated DC Plan:  ASSISTED LIVING / REST HOME  In-house referral  Clinical Social Worker      DC Planning Services  CM consult      Choice offered to / List presented to:             Status of service:  Completed, signed off Medicare Important Message given?   (If response is "NO", the following Medicare IM given date fields will be blank) Date Medicare IM given:   Date Additional Medicare IM given:    Discharge Disposition:  ASSISTED LIVING  Per UR Regulation:    If discussed at Long Length of Stay Meetings, dates discussed:    Comments:  07/23/11 1318 Arlyss Queen, RN BSN CM Pt discharged back to [facility]. No CM needs noted at this time. CSW is aware and is arranging transfer back to facility. Pt nurse aware of discharge arrangement. 07/19/11 1406 Arlyss Queen, RN BSN CM

## 2011-11-26 ENCOUNTER — Other Ambulatory Visit (HOSPITAL_COMMUNITY): Payer: Self-pay | Admitting: Internal Medicine

## 2011-11-26 ENCOUNTER — Ambulatory Visit (HOSPITAL_COMMUNITY)
Admission: RE | Admit: 2011-11-26 | Discharge: 2011-11-26 | Disposition: A | Discharge status: Home / Self Care | Payer: Medicare Other | Source: Ambulatory Visit | Attending: Internal Medicine | Admitting: Internal Medicine

## 2011-11-26 DIAGNOSIS — J42 Unspecified chronic bronchitis: Secondary | ICD-10-CM | POA: Insufficient documentation

## 2011-11-26 DIAGNOSIS — R05 Cough: Secondary | ICD-10-CM | POA: Insufficient documentation

## 2011-11-26 DIAGNOSIS — R059 Cough, unspecified: Secondary | ICD-10-CM | POA: Insufficient documentation

## 2011-11-26 DIAGNOSIS — F17200 Nicotine dependence, unspecified, uncomplicated: Secondary | ICD-10-CM | POA: Insufficient documentation

## 2011-11-26 DIAGNOSIS — J40 Bronchitis, not specified as acute or chronic: Secondary | ICD-10-CM

## 2012-01-17 ENCOUNTER — Other Ambulatory Visit (HOSPITAL_COMMUNITY): Payer: Self-pay | Admitting: Internal Medicine

## 2012-01-17 DIAGNOSIS — R109 Unspecified abdominal pain: Secondary | ICD-10-CM

## 2012-01-18 ENCOUNTER — Ambulatory Visit (HOSPITAL_COMMUNITY)
Admission: RE | Admit: 2012-01-18 | Discharge: 2012-01-18 | Disposition: A | Discharge status: Home / Self Care | Payer: Medicare Other | Source: Ambulatory Visit | Attending: Internal Medicine | Admitting: Internal Medicine

## 2012-01-18 DIAGNOSIS — R109 Unspecified abdominal pain: Secondary | ICD-10-CM | POA: Insufficient documentation

## 2013-11-19 ENCOUNTER — Ambulatory Visit (HOSPITAL_COMMUNITY)
Admission: RE | Admit: 2013-11-19 | Discharge: 2013-11-19 | Disposition: A | Discharge status: Home / Self Care | Payer: Medicare Other | Source: Ambulatory Visit | Attending: Internal Medicine | Admitting: Internal Medicine

## 2013-11-19 ENCOUNTER — Other Ambulatory Visit (HOSPITAL_COMMUNITY): Payer: Self-pay | Admitting: Internal Medicine

## 2013-11-19 DIAGNOSIS — M545 Low back pain, unspecified: Secondary | ICD-10-CM | POA: Insufficient documentation

## 2014-05-05 DIAGNOSIS — E1165 Type 2 diabetes mellitus with hyperglycemia: Secondary | ICD-10-CM | POA: Diagnosis not present

## 2014-05-05 DIAGNOSIS — J449 Chronic obstructive pulmonary disease, unspecified: Secondary | ICD-10-CM | POA: Diagnosis not present

## 2014-05-13 DIAGNOSIS — L110 Acquired keratosis follicularis: Secondary | ICD-10-CM | POA: Diagnosis not present

## 2014-05-13 DIAGNOSIS — E1142 Type 2 diabetes mellitus with diabetic polyneuropathy: Secondary | ICD-10-CM | POA: Diagnosis not present

## 2014-05-13 DIAGNOSIS — L609 Nail disorder, unspecified: Secondary | ICD-10-CM | POA: Diagnosis not present

## 2014-05-13 DIAGNOSIS — E1140 Type 2 diabetes mellitus with diabetic neuropathy, unspecified: Secondary | ICD-10-CM | POA: Diagnosis not present

## 2014-06-03 DIAGNOSIS — J449 Chronic obstructive pulmonary disease, unspecified: Secondary | ICD-10-CM | POA: Diagnosis not present

## 2014-06-03 DIAGNOSIS — E1165 Type 2 diabetes mellitus with hyperglycemia: Secondary | ICD-10-CM | POA: Diagnosis not present

## 2014-07-05 DIAGNOSIS — J449 Chronic obstructive pulmonary disease, unspecified: Secondary | ICD-10-CM | POA: Diagnosis not present

## 2014-07-05 DIAGNOSIS — E1165 Type 2 diabetes mellitus with hyperglycemia: Secondary | ICD-10-CM | POA: Diagnosis not present

## 2014-07-14 DIAGNOSIS — H25813 Combined forms of age-related cataract, bilateral: Secondary | ICD-10-CM | POA: Diagnosis not present

## 2014-07-14 DIAGNOSIS — E119 Type 2 diabetes mellitus without complications: Secondary | ICD-10-CM | POA: Diagnosis not present

## 2014-07-29 DIAGNOSIS — L110 Acquired keratosis follicularis: Secondary | ICD-10-CM | POA: Diagnosis not present

## 2014-07-29 DIAGNOSIS — E1142 Type 2 diabetes mellitus with diabetic polyneuropathy: Secondary | ICD-10-CM | POA: Diagnosis not present

## 2014-07-29 DIAGNOSIS — E1140 Type 2 diabetes mellitus with diabetic neuropathy, unspecified: Secondary | ICD-10-CM | POA: Diagnosis not present

## 2014-07-29 DIAGNOSIS — L609 Nail disorder, unspecified: Secondary | ICD-10-CM | POA: Diagnosis not present

## 2014-08-06 DIAGNOSIS — E1165 Type 2 diabetes mellitus with hyperglycemia: Secondary | ICD-10-CM | POA: Diagnosis not present

## 2014-08-06 DIAGNOSIS — J449 Chronic obstructive pulmonary disease, unspecified: Secondary | ICD-10-CM | POA: Diagnosis not present

## 2014-09-05 DIAGNOSIS — J449 Chronic obstructive pulmonary disease, unspecified: Secondary | ICD-10-CM | POA: Diagnosis not present

## 2014-09-05 DIAGNOSIS — E1165 Type 2 diabetes mellitus with hyperglycemia: Secondary | ICD-10-CM | POA: Diagnosis not present

## 2014-09-20 DIAGNOSIS — J449 Chronic obstructive pulmonary disease, unspecified: Secondary | ICD-10-CM | POA: Diagnosis not present

## 2014-09-20 DIAGNOSIS — E1165 Type 2 diabetes mellitus with hyperglycemia: Secondary | ICD-10-CM | POA: Diagnosis not present

## 2014-09-24 DIAGNOSIS — E1165 Type 2 diabetes mellitus with hyperglycemia: Secondary | ICD-10-CM | POA: Diagnosis not present

## 2014-09-24 DIAGNOSIS — I10 Essential (primary) hypertension: Secondary | ICD-10-CM | POA: Diagnosis not present

## 2014-10-13 DIAGNOSIS — L609 Nail disorder, unspecified: Secondary | ICD-10-CM | POA: Diagnosis not present

## 2014-10-13 DIAGNOSIS — E1142 Type 2 diabetes mellitus with diabetic polyneuropathy: Secondary | ICD-10-CM | POA: Diagnosis not present

## 2014-10-13 DIAGNOSIS — L110 Acquired keratosis follicularis: Secondary | ICD-10-CM | POA: Diagnosis not present

## 2014-10-13 DIAGNOSIS — E1140 Type 2 diabetes mellitus with diabetic neuropathy, unspecified: Secondary | ICD-10-CM | POA: Diagnosis not present

## 2014-10-28 DIAGNOSIS — Z0000 Encounter for general adult medical examination without abnormal findings: Secondary | ICD-10-CM | POA: Diagnosis not present

## 2014-10-28 DIAGNOSIS — J449 Chronic obstructive pulmonary disease, unspecified: Secondary | ICD-10-CM | POA: Diagnosis not present

## 2014-10-28 DIAGNOSIS — E1165 Type 2 diabetes mellitus with hyperglycemia: Secondary | ICD-10-CM | POA: Diagnosis not present

## 2014-11-28 DIAGNOSIS — J449 Chronic obstructive pulmonary disease, unspecified: Secondary | ICD-10-CM | POA: Diagnosis not present

## 2014-11-28 DIAGNOSIS — E1165 Type 2 diabetes mellitus with hyperglycemia: Secondary | ICD-10-CM | POA: Diagnosis not present

## 2014-12-17 DIAGNOSIS — J449 Chronic obstructive pulmonary disease, unspecified: Secondary | ICD-10-CM | POA: Diagnosis not present

## 2014-12-17 DIAGNOSIS — Z23 Encounter for immunization: Secondary | ICD-10-CM | POA: Diagnosis not present

## 2014-12-23 DIAGNOSIS — J449 Chronic obstructive pulmonary disease, unspecified: Secondary | ICD-10-CM | POA: Diagnosis not present

## 2014-12-23 DIAGNOSIS — E1165 Type 2 diabetes mellitus with hyperglycemia: Secondary | ICD-10-CM | POA: Diagnosis not present

## 2014-12-29 DIAGNOSIS — L609 Nail disorder, unspecified: Secondary | ICD-10-CM | POA: Diagnosis not present

## 2014-12-29 DIAGNOSIS — L110 Acquired keratosis follicularis: Secondary | ICD-10-CM | POA: Diagnosis not present

## 2014-12-29 DIAGNOSIS — E1142 Type 2 diabetes mellitus with diabetic polyneuropathy: Secondary | ICD-10-CM | POA: Diagnosis not present

## 2014-12-29 DIAGNOSIS — E1140 Type 2 diabetes mellitus with diabetic neuropathy, unspecified: Secondary | ICD-10-CM | POA: Diagnosis not present

## 2014-12-30 ENCOUNTER — Telehealth: Payer: Self-pay

## 2014-12-30 NOTE — Telephone Encounter (Signed)
Scott [name] from [facility] called to start the triage process for pt to have colonoscoy. She will fax list of meds and copies of insurance cards to me.

## 2015-01-06 NOTE — Telephone Encounter (Signed)
Info received. Pt has been scheduled an OV with Neil Crouch, PA on 01/27/2015 at 11:30 Am.

## 2015-01-10 NOTE — Telephone Encounter (Signed)
I called [place] [employer] and [name] is pt's Education officer, museum. She can come with him to his appt here on 01/27/2015 at 11:30 AM with Neil Crouch, PA. She is aware the facility will bring the pt and she just needs to meet him here.  She will be able to go with him to procedure.  Stacy at [facility] is aware the social worker will meet the pt here.

## 2015-01-22 DIAGNOSIS — E1165 Type 2 diabetes mellitus with hyperglycemia: Secondary | ICD-10-CM | POA: Diagnosis not present

## 2015-01-22 DIAGNOSIS — J449 Chronic obstructive pulmonary disease, unspecified: Secondary | ICD-10-CM | POA: Diagnosis not present

## 2015-01-27 ENCOUNTER — Other Ambulatory Visit: Payer: Self-pay

## 2015-01-27 ENCOUNTER — Encounter: Payer: Self-pay | Admitting: Gastroenterology

## 2015-01-27 ENCOUNTER — Ambulatory Visit (INDEPENDENT_AMBULATORY_CARE_PROVIDER_SITE_OTHER): Payer: Medicare Other | Admitting: Gastroenterology

## 2015-01-27 VITALS — BP 122/77 | HR 87 | Temp 97.9°F | Ht 74.0 in | Wt 210.4 lb

## 2015-01-27 DIAGNOSIS — Z7984 Long term (current) use of oral hypoglycemic drugs: Secondary | ICD-10-CM | POA: Diagnosis not present

## 2015-01-27 DIAGNOSIS — K760 Fatty (change of) liver, not elsewhere classified: Secondary | ICD-10-CM | POA: Insufficient documentation

## 2015-01-27 DIAGNOSIS — K824 Cholesterolosis of gallbladder: Secondary | ICD-10-CM

## 2015-01-27 DIAGNOSIS — E119 Type 2 diabetes mellitus without complications: Secondary | ICD-10-CM | POA: Diagnosis not present

## 2015-01-27 DIAGNOSIS — Z79899 Other long term (current) drug therapy: Secondary | ICD-10-CM | POA: Diagnosis not present

## 2015-01-27 DIAGNOSIS — Z1211 Encounter for screening for malignant neoplasm of colon: Secondary | ICD-10-CM | POA: Diagnosis not present

## 2015-01-27 MED ORDER — BISACODYL 5 MG PO TBEC: 5.0000 mg | DELAYED_RELEASE_TABLET | Freq: Every day | ORAL | Status: DC | PRN

## 2015-01-27 MED ORDER — PEG 3350-KCL-NA BICARB-NACL 420 G PO SOLR: 4000.0000 mL | Freq: Once | ORAL | Status: DC

## 2015-01-27 MED ORDER — PHOSPHATE LAXATIVE 2.7-7.2 GM/15ML PO SOLN: 15.0000 mL | Freq: Once | ORAL | Status: DC

## 2015-01-27 NOTE — Assessment & Plan Note (Signed)
Surveillance ultrasound for gallbladder polyp. Await preop labs.

## 2015-01-27 NOTE — Assessment & Plan Note (Signed)
Surveillance ultrasound

## 2015-01-27 NOTE — Patient Instructions (Addendum)
Colonoscopy as scheduled. See separate instructions.   Please have abdominal ultrasound done to follow up on fatty liver and gallbladder polyps.

## 2015-01-27 NOTE — Assessment & Plan Note (Signed)
Screening colonoscopy in the near future. Plan for deep sedation given polypharmacy. Patient has a history of prior tracheostomy.  I have discussed the risks, alternatives, benefits with regards to but not limited to the risk of reaction to medication, bleeding, infection, perforation and the patient is agreeable to proceed. Written consent to be obtained.
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
## 2015-01-27 NOTE — Progress Notes (Signed)
CC'D TO PCP °

## 2015-02-04 ENCOUNTER — Other Ambulatory Visit: Payer: Self-pay | Admitting: Gastroenterology

## 2015-02-04 ENCOUNTER — Ambulatory Visit (HOSPITAL_COMMUNITY)
Admission: RE | Admit: 2015-02-04 | Discharge: 2015-02-04 | Disposition: A | Discharge status: Home / Self Care | Payer: Medicare Other | Source: Ambulatory Visit | Attending: Gastroenterology | Admitting: Gastroenterology

## 2015-02-04 DIAGNOSIS — K760 Fatty (change of) liver, not elsewhere classified: Secondary | ICD-10-CM | POA: Diagnosis not present

## 2015-02-04 DIAGNOSIS — K824 Cholesterolosis of gallbladder: Secondary | ICD-10-CM

## 2015-02-08 NOTE — Patient Instructions (Signed)
Scott [name]  02/08/2015     @PREFPERIOPPHARMACY @   Your procedure is scheduled on 02/14/2015.  Report to [facility] at 8:30 A.M.  Call this number if you have problems the morning of surgery:  [phone]   Remember:  Do not eat food or drink liquids after midnight.  Take these medicines the morning of surgery with A SIP OF WATER Albuterol and bring with you, Depakote, Lisinopril, Tramadol   Do not wear jewelry, make-up or nail polish.  Do not wear lotions, powders, or perfumes.  You may wear deodorant.  Do not shave 48 hours prior to surgery.  Men may shave face and neck.  Do not bring valuables to the hospital.  [facility] is not responsible for any belongings or valuables.  Contacts, dentures or bridgework may not be worn into surgery.  Leave your suitcase in the car.  After surgery it may be brought to your room.  For patients admitted to the hospital, discharge time will be determined by your treatment team.  Patients discharged the day of surgery will not be allowed to drive home.    Please read over the following fact sheets that you were given. Anesthesia Post-op Instructions     PATIENT INSTRUCTIONS POST-ANESTHESIA  IMMEDIATELY FOLLOWING SURGERY:  Do not drive or operate machinery for the first twenty four hours after surgery.  Do not make any important decisions for twenty four hours after surgery or while taking narcotic pain medications or sedatives.  If you develop intractable nausea and vomiting or a severe headache please notify your doctor immediately.  FOLLOW-UP:  Please make an appointment with your surgeon as instructed. You do not need to follow up with anesthesia unless specifically instructed to do so.  WOUND CARE INSTRUCTIONS (if applicable):  Keep a dry clean dressing on the anesthesia/puncture wound site if there is drainage.  Once the wound has quit draining you may leave it open to air.  Generally you should leave the bandage intact for  twenty four hours unless there is drainage.  If the epidural site drains for more than 36-48 hours please call the anesthesia department.  QUESTIONS?:  Please feel free to call your physician or the hospital operator if you have any questions, and they will be happy to assist you.      Colonoscopy A colonoscopy is an exam to look at the entire large intestine (colon). This exam can help find problems such as tumors, polyps, inflammation, and areas of bleeding. The exam takes about 1 hour.  LET [facility] CARE PROVIDER KNOW ABOUT:   Any allergies you have.  All medicines you are taking, including vitamins, herbs, eye drops, creams, and over-the-counter medicines.  Previous problems you or members of your family have had with the use of anesthetics.  Any blood disorders you have.  Previous surgeries you have had.  Medical conditions you have. RISKS AND COMPLICATIONS  Generally, this is a safe procedure. However, as with any procedure, complications can occur. Possible complications include:  Bleeding.  Tearing or rupture of the colon wall.  Reaction to medicines given during the exam.  Infection (rare). BEFORE THE PROCEDURE   Ask your health care provider about changing or stopping your regular medicines.  You may be prescribed an oral bowel prep. This involves drinking a large amount of medicated liquid, starting the day before your procedure. The liquid will cause you to have multiple loose stools until your stool is almost clear or light green. This  cleans out your colon in preparation for the procedure.  Do not eat or drink anything else once you have started the bowel prep, unless your health care provider tells you it is safe to do so.  Arrange for someone to drive you home after the procedure. PROCEDURE   You will be given medicine to help you relax (sedative).  You will lie on your side with your knees bent.  A long, flexible tube with a light and camera on the end  (colonoscope) will be inserted through the rectum and into the colon. The camera sends video back to a computer screen as it moves through the colon. The colonoscope also releases carbon dioxide gas to inflate the colon. This helps your health care provider see the area better.  During the exam, your health care provider may take a small tissue sample (biopsy) to be examined under a microscope if any abnormalities are found.  The exam is finished when the entire colon has been viewed. AFTER THE PROCEDURE   Do not drive for 24 hours after the exam.  You may have a small amount of blood in your stool.  You may pass moderate amounts of gas and have mild abdominal cramping or bloating. This is caused by the gas used to inflate your colon during the exam.  Ask when your test results will be ready and how you will get your results. Make sure you get your test results.   This information is not intended to replace advice given to you by your health care provider. Make sure you discuss any questions you have with your health care provider.   Document Released: 03/23/2000 Document Revised: 01/14/2013 Document Reviewed: 12/01/2012 Elsevier Interactive Patient Education [employer].

## 2015-02-10 ENCOUNTER — Other Ambulatory Visit: Payer: Self-pay

## 2015-02-10 ENCOUNTER — Encounter (HOSPITAL_COMMUNITY)
Admission: RE | Admit: 2015-02-10 | Discharge: 2015-02-10 | Disposition: A | Discharge status: Home / Self Care | Payer: Medicare Other | Source: Ambulatory Visit | Attending: Internal Medicine | Admitting: Internal Medicine

## 2015-02-10 ENCOUNTER — Encounter (HOSPITAL_COMMUNITY): Payer: Self-pay

## 2015-02-10 DIAGNOSIS — Z01818 Encounter for other preprocedural examination: Secondary | ICD-10-CM | POA: Insufficient documentation

## 2015-02-10 HISTORY — DX: Type 2 diabetes mellitus without complications: E11.9

## 2015-02-10 HISTORY — DX: Pneumonia, unspecified organism: J18.9

## 2015-02-10 LAB — BASIC METABOLIC PANEL
ANION GAP: 10 (ref 5–15)
BUN: 22 mg/dL — ABNORMAL HIGH (ref 6–20)
CHLORIDE: 103 mmol/L (ref 101–111)
CO2: 25 mmol/L (ref 22–32)
Calcium: 9.5 mg/dL (ref 8.9–10.3)
Creatinine, Ser: 1.36 mg/dL — ABNORMAL HIGH (ref 0.61–1.24)
GFR calc Af Amer: >60 mL/min (ref >60)
GFR calc non Af Amer: 58 mL/min — ABNORMAL LOW (ref >60)
GLUCOSE: 120 mg/dL — AB (ref 65–99)
POTASSIUM: 4.8 mmol/L (ref 3.5–5.1)
Sodium: 138 mmol/L (ref 135–145)

## 2015-02-10 LAB — CBC WITH DIFFERENTIAL/PLATELET
BASOS ABS: 0 10*3/uL (ref 0.0–0.1)
BASOS PCT: 0 %
EOS PCT: 2 %
Eosinophils Absolute: 0.2 10*3/uL (ref 0.0–0.7)
HEMATOCRIT: 43.8 % (ref 39.0–52.0)
Hemoglobin: 14.4 g/dL (ref 13.0–17.0)
LYMPHS PCT: 39 %
Lymphs Abs: 4.8 10*3/uL — ABNORMAL HIGH (ref 0.7–4.0)
MCH: 31.2 pg (ref 26.0–34.0)
MCHC: 32.9 g/dL (ref 30.0–36.0)
MCV: 94.8 fL (ref 78.0–100.0)
MONO ABS: 0.7 10*3/uL (ref 0.1–1.0)
Monocytes Relative: 6 %
NEUTROS ABS: 6.5 10*3/uL (ref 1.7–7.7)
Neutrophils Relative %: 53 %
Platelets: 259 10*3/uL (ref 150–400)
RBC: 4.62 MIL/uL (ref 4.22–5.81)
RDW: 13.9 % (ref 11.5–15.5)
WBC: 12.2 10*3/uL — AB (ref 4.0–10.5)

## 2015-02-10 LAB — SURGICAL PCR SCREEN
MRSA, PCR: NEGATIVE
Staphylococcus aureus: NEGATIVE

## 2015-02-14 ENCOUNTER — Encounter (HOSPITAL_COMMUNITY)
Admission: RE | Disposition: A | Discharge status: Home / Self Care | Payer: Self-pay | Source: Ambulatory Visit | Attending: Internal Medicine

## 2015-02-14 ENCOUNTER — Ambulatory Visit (HOSPITAL_COMMUNITY): Payer: Medicare Other | Admitting: Anesthesiology

## 2015-02-14 ENCOUNTER — Encounter (HOSPITAL_COMMUNITY): Payer: Self-pay | Admitting: *Deleted

## 2015-02-14 ENCOUNTER — Ambulatory Visit (HOSPITAL_COMMUNITY)
Admission: RE | Admit: 2015-02-14 | Discharge: 2015-02-14 | Disposition: A | Discharge status: Home / Self Care | Payer: Medicare Other | Source: Ambulatory Visit | Attending: Internal Medicine | Admitting: Internal Medicine

## 2015-02-14 DIAGNOSIS — K5730 Diverticulosis of large intestine without perforation or abscess without bleeding: Secondary | ICD-10-CM | POA: Diagnosis not present

## 2015-02-14 DIAGNOSIS — K635 Polyp of colon: Secondary | ICD-10-CM | POA: Diagnosis not present

## 2015-02-14 DIAGNOSIS — K621 Rectal polyp: Secondary | ICD-10-CM | POA: Diagnosis not present

## 2015-02-14 DIAGNOSIS — F79 Unspecified intellectual disabilities: Secondary | ICD-10-CM | POA: Diagnosis not present

## 2015-02-14 DIAGNOSIS — D125 Benign neoplasm of sigmoid colon: Secondary | ICD-10-CM | POA: Insufficient documentation

## 2015-02-14 DIAGNOSIS — Z7982 Long term (current) use of aspirin: Secondary | ICD-10-CM | POA: Diagnosis not present

## 2015-02-14 DIAGNOSIS — F17210 Nicotine dependence, cigarettes, uncomplicated: Secondary | ICD-10-CM | POA: Insufficient documentation

## 2015-02-14 DIAGNOSIS — Z800 Family history of malignant neoplasm of digestive organs: Secondary | ICD-10-CM | POA: Insufficient documentation

## 2015-02-14 DIAGNOSIS — Z1211 Encounter for screening for malignant neoplasm of colon: Secondary | ICD-10-CM | POA: Diagnosis not present

## 2015-02-14 DIAGNOSIS — Z79899 Other long term (current) drug therapy: Secondary | ICD-10-CM | POA: Insufficient documentation

## 2015-02-14 DIAGNOSIS — F329 Major depressive disorder, single episode, unspecified: Secondary | ICD-10-CM | POA: Insufficient documentation

## 2015-02-14 DIAGNOSIS — D128 Benign neoplasm of rectum: Secondary | ICD-10-CM | POA: Diagnosis not present

## 2015-02-14 DIAGNOSIS — F319 Bipolar disorder, unspecified: Secondary | ICD-10-CM | POA: Diagnosis not present

## 2015-02-14 HISTORY — PX: POLYPECTOMY: SHX5525

## 2015-02-14 HISTORY — PX: COLONOSCOPY WITH PROPOFOL: SHX5780

## 2015-02-14 LAB — GLUCOSE, CAPILLARY: Glucose-Capillary: 118 mg/dL — ABNORMAL HIGH (ref 65–99)

## 2015-02-14 SURGERY — COLONOSCOPY WITH PROPOFOL
Anesthesia: Monitor Anesthesia Care

## 2015-02-14 MED ORDER — MIDAZOLAM HCL 2 MG/2ML IJ SOLN
1.0000 mg | INTRAMUSCULAR | Status: DC | PRN
  Administered 2015-02-14: 2 mg via INTRAVENOUS

## 2015-02-14 MED ORDER — PROPOFOL 10 MG/ML IV BOLUS
INTRAVENOUS | Status: AC
  Filled 2015-02-14: qty 20

## 2015-02-14 MED ORDER — MIDAZOLAM HCL 2 MG/2ML IJ SOLN
INTRAMUSCULAR | Status: AC
  Filled 2015-02-14: qty 4

## 2015-02-14 MED ORDER — ONDANSETRON HCL 4 MG/2ML IJ SOLN: 4.0000 mg | Freq: Once | INTRAMUSCULAR | Status: DC | PRN

## 2015-02-14 MED ORDER — FENTANYL CITRATE (PF) 100 MCG/2ML IJ SOLN
25.0000 ug | INTRAMUSCULAR | Status: AC
  Administered 2015-02-14 (×2): 25 ug via INTRAVENOUS

## 2015-02-14 MED ORDER — MIDAZOLAM HCL 2 MG/2ML IJ SOLN
INTRAMUSCULAR | Status: AC
  Filled 2015-02-14: qty 2

## 2015-02-14 MED ORDER — GLYCOPYRROLATE 0.2 MG/ML IJ SOLN
0.2000 mg | Freq: Once | INTRAMUSCULAR | Status: AC
  Administered 2015-02-14: 0.2 mg via INTRAVENOUS

## 2015-02-14 MED ORDER — GLYCOPYRROLATE 0.2 MG/ML IJ SOLN
INTRAMUSCULAR | Status: AC
  Filled 2015-02-14: qty 1

## 2015-02-14 MED ORDER — LIDOCAINE HCL (PF) 1 % IJ SOLN
INTRAMUSCULAR | Status: AC
  Filled 2015-02-14: qty 5

## 2015-02-14 MED ORDER — ONDANSETRON HCL 4 MG/2ML IJ SOLN
4.0000 mg | Freq: Once | INTRAMUSCULAR | Status: AC
  Administered 2015-02-14: 4 mg via INTRAVENOUS

## 2015-02-14 MED ORDER — PROPOFOL 500 MG/50ML IV EMUL
INTRAVENOUS | Status: DC | PRN
  Administered 2015-02-14: 150 ug/kg/min via INTRAVENOUS

## 2015-02-14 MED ORDER — FENTANYL CITRATE (PF) 100 MCG/2ML IJ SOLN
INTRAMUSCULAR | Status: AC
  Filled 2015-02-14: qty 2

## 2015-02-14 MED ORDER — FENTANYL CITRATE (PF) 100 MCG/2ML IJ SOLN: 25.0000 ug | INTRAMUSCULAR | Status: DC | PRN

## 2015-02-14 MED ORDER — LIDOCAINE HCL (CARDIAC) 10 MG/ML IV SOLN
INTRAVENOUS | Status: DC | PRN
  Administered 2015-02-14: 25 mg via INTRAVENOUS

## 2015-02-14 MED ORDER — LACTATED RINGERS IV SOLN
INTRAVENOUS | Status: DC
  Administered 2015-02-14: 10:00:00 via INTRAVENOUS

## 2015-02-14 MED ORDER — MIDAZOLAM HCL 5 MG/5ML IJ SOLN
INTRAMUSCULAR | Status: DC | PRN
  Administered 2015-02-14 (×2): 1 mg via INTRAVENOUS

## 2015-02-14 MED ORDER — ONDANSETRON HCL 4 MG/2ML IJ SOLN
INTRAMUSCULAR | Status: AC
Start: 2015-02-14 — End: 2015-02-14
  Filled 2015-02-14: qty 2

## 2015-02-14 MED ORDER — STERILE WATER FOR IRRIGATION IR SOLN
Status: DC | PRN
  Administered 2015-02-14: 1000 mL

## 2015-02-14 SURGICAL SUPPLY — 23 items
ELECT REM PT RETURN 9FT ADLT (ELECTROSURGICAL)
ELECTRODE REM PT RTRN 9FT ADLT (ELECTROSURGICAL) IMPLANT
FCP BXJMBJMB 240X2.8X (CUTTING FORCEPS)
FLOOR PAD 36X40 (MISCELLANEOUS) ×3
FORCEPS BIOP RAD 4 LRG CAP 4 (CUTTING FORCEPS) IMPLANT
FORCEPS BIOP RJ4 240 W/NDL (CUTTING FORCEPS)
FORCEPS BXJMBJMB 240X2.8X (CUTTING FORCEPS) IMPLANT
FORMALIN 10 PREFIL 20ML (MISCELLANEOUS) ×4 IMPLANT
INJECTOR/SNARE I SNARE (MISCELLANEOUS) IMPLANT
KIT ENDO PROCEDURE PEN (KITS) ×3 IMPLANT
MANIFOLD NEPTUNE II (INSTRUMENTS) ×2 IMPLANT
NDL SCLEROTHERAPY 25GX240 (NEEDLE) IMPLANT
NEEDLE SCLEROTHERAPY 25GX240 (NEEDLE) IMPLANT
PAD FLOOR 36X40 (MISCELLANEOUS) IMPLANT
PROBE APC STR FIRE (PROBE) IMPLANT
PROBE INJECTION GOLD (MISCELLANEOUS)
PROBE INJECTION GOLD 7FR (MISCELLANEOUS) IMPLANT
SNARE ROTATE MED OVAL 20MM (MISCELLANEOUS) ×2 IMPLANT
SNARE SHORT THROW 13M SML OVAL (MISCELLANEOUS) IMPLANT
SYR 50ML LL SCALE MARK (SYRINGE) ×2 IMPLANT
TRAP SPECIMEN MUCOUS 40CC (MISCELLANEOUS) IMPLANT
TUBING IRRIGATION ENDOGATOR (MISCELLANEOUS) ×2 IMPLANT
WATER STERILE IRR 1000ML POUR (IV SOLUTION) ×2 IMPLANT

## 2015-02-14 NOTE — Anesthesia Preprocedure Evaluation (Signed)
Anesthesia Evaluation  Patient identified by MRN, date of birth, ID band Patient awake    Reviewed: Allergy & Precautions, NPO status , Patient's Chart, lab work & pertinent test results  Airway Mallampati: I  TM Distance: >3 FB     Dental  (+) Edentulous Upper, Poor Dentition   Pulmonary Current Smoker,    breath sounds clear to auscultation       Cardiovascular hypertension, Pt. on medications negative cardio ROS   Rhythm:Regular Rate:Normal     Neuro/Psych PSYCHIATRIC DISORDERS Depression Bipolar Disorder Traumatic brain injury 2006    GI/Hepatic negative GI ROS,   Endo/Other  diabetes, Type 2, Oral Hypoglycemic Agents  Renal/GU      Musculoskeletal   Abdominal   Peds  Hematology   Anesthesia Other Findings   Reproductive/Obstetrics                             Anesthesia Physical Anesthesia Plan  ASA: III  Anesthesia Plan: MAC   Post-op Pain Management:    Induction: Intravenous  Airway Management Planned: Simple Face Mask  Additional Equipment:   Intra-op Plan:   Post-operative Plan:   Informed Consent: I have reviewed the patients History and Physical, chart, labs and discussed the procedure including the risks, benefits and alternatives for the proposed anesthesia with the patient or authorized representative who has indicated his/her understanding and acceptance.     Plan Discussed with:   Anesthesia Plan Comments:         Anesthesia Quick Evaluation
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
## 2015-02-14 NOTE — Anesthesia Postprocedure Evaluation (Signed)
  Anesthesia Post-op Note  Patient: Edras Wilford Cyran  Procedure(s) Performed: Procedure(s) with comments: COLONOSCOPY WITH PROPOFOL (N/A) - cecum time in 0957  time out   total time POLYPECTOMY (N/A) - sigmoid colon, rectal  Patient Location: PACU  Anesthesia Type:MAC  Level of Consciousness: awake, alert , oriented and patient cooperative  Airway and Oxygen Therapy: Patient Spontanous Breathing and Patient connected to face mask oxygen  Post-op Pain: none  Post-op Assessment: Post-op Vital signs reviewed, Patient's Cardiovascular Status Stable, Respiratory Function Stable, Patent Airway, No signs of Nausea or vomiting and Pain level controlled              Post-op Vital Signs: Reviewed and stable  Last Vitals:  Filed Vitals:   02/14/15 0940  BP: 137/88  Temp:   Resp: 17    Complications: No apparent anesthesia complications

## 2015-02-14 NOTE — Discharge Instructions (Signed)
Colonoscopy Discharge Instructions  Read the instructions outlined below and refer to this sheet in the next few weeks. These discharge instructions provide you with general information on caring for yourself after you leave the hospital. Your doctor may also give you specific instructions. While your treatment has been planned according to the most current medical practices available, unavoidable complications occasionally occur. If you have any problems or questions after discharge, call Dr. Gala Romney at [phone]. ACTIVITY  You may resume your regular activity, but move at a slower pace for the next 24 hours.   Take frequent rest periods for the next 24 hours.   Walking will help get rid of the air and reduce the bloated feeling in your belly (abdomen).   No driving for 24 hours (because of the medicine (anesthesia) used during the test).    Do not sign any important legal documents or operate any machinery for 24 hours (because of the anesthesia used during the test).  NUTRITION  Drink plenty of fluids.   You may resume your normal diet as instructed by your doctor.   Begin with a light meal and progress to your normal diet. Heavy or fried foods are harder to digest and may make you feel sick to your stomach (nauseated).   Avoid alcoholic beverages for 24 hours or as instructed.  MEDICATIONS  You may resume your normal medications unless your doctor tells you otherwise.  WHAT YOU CAN EXPECT TODAY  Some feelings of bloating in the abdomen.   Passage of more gas than usual.   Spotting of blood in your stool or on the toilet paper.  IF YOU HAD POLYPS REMOVED DURING THE COLONOSCOPY:  No aspirin products for 7 days or as instructed.   No alcohol for 7 days or as instructed.   Eat a soft diet for the next 24 hours.  FINDING OUT THE RESULTS OF YOUR TEST Not all test results are available during your visit. If your test results are not back during the visit, make an appointment  with your caregiver to find out the results. Do not assume everything is normal if you have not heard from your caregiver or the medical facility. It is important for you to follow up on all of your test results.  SEEK IMMEDIATE MEDICAL ATTENTION IF:  You have more than a spotting of blood in your stool.   Your belly is swollen (abdominal distention).   You are nauseated or vomiting.   You have a temperature over 101.   You have abdominal pain or discomfort that is severe or gets worse throughout the day.   Diverticulosis and colon polyp information provided  Further recommendations to follow pending review of pathology report Colon Polyps Polyps are lumps of extra tissue growing inside the body. Polyps can grow in the large intestine (colon). Most colon polyps are noncancerous (benign). However, some colon polyps can become cancerous over time. Polyps that are larger than a pea may be harmful. To be safe, caregivers remove and test all polyps. CAUSES  Polyps form when mutations in the genes cause your cells to grow and divide even though no more tissue is needed. RISK FACTORS There are a number of risk factors that can increase your chances of getting colon polyps. They include:  Being older than 50 years.  Family history of colon polyps or colon cancer.  Long-term colon diseases, such as colitis or Crohn disease.  Being overweight.  Smoking.  Being inactive.  Drinking too much alcohol.  SYMPTOMS  Most small polyps do not cause symptoms. If symptoms are present, they may include:  Blood in the stool. The stool may look dark red or black.  Constipation or diarrhea that lasts longer than 1 week. DIAGNOSIS People often do not know they have polyps until their caregiver finds them during a regular checkup. Your caregiver can use 4 tests to check for polyps:  Digital rectal exam. The caregiver wears gloves and feels inside the rectum. This test would find polyps only in the  rectum.  Barium enema. The caregiver puts a liquid called barium into your rectum before taking X-rays of your colon. Barium makes your colon look white. Polyps are dark, so they are easy to see in the X-ray pictures.  Sigmoidoscopy. A thin, flexible tube (sigmoidoscope) is placed into your rectum. The sigmoidoscope has a light and tiny camera in it. The caregiver uses the sigmoidoscope to look at the last third of your colon.  Colonoscopy. This test is like sigmoidoscopy, but the caregiver looks at the entire colon. This is the most common method for finding and removing polyps. TREATMENT  Any polyps will be removed during a sigmoidoscopy or colonoscopy. The polyps are then tested for cancer. PREVENTION  To help lower your risk of getting more colon polyps:  Eat plenty of fruits and vegetables. Avoid eating fatty foods.  Do not smoke.  Avoid drinking alcohol.  Exercise every day.  Lose weight if recommended by your caregiver.  Eat plenty of calcium and folate. Foods that are rich in calcium include milk, cheese, and broccoli. Foods that are rich in folate include chickpeas, kidney beans, and spinach. HOME CARE INSTRUCTIONS Keep all follow-up appointments as directed by your caregiver. You may need periodic exams to check for polyps. SEEK MEDICAL CARE IF: You notice bleeding during a bowel movement.   This information is not intended to replace advice given to you by your health care provider. Make sure you discuss any questions you have with your health care provider.   Document Released: 12/21/2003 Document Revised: 04/16/2014 Document Reviewed: 06/05/2011 Elsevier Interactive Patient Education 2016 [employer].   Diverticulosis Diverticulosis is the condition that develops when small pouches (diverticula) form in the wall of your colon. Your colon, or large intestine, is where water is absorbed and stool is formed. The pouches form when the inside layer of your colon pushes  through weak spots in the outer layers of your colon. CAUSES  No one knows exactly what causes diverticulosis. RISK FACTORS  Being older than 50. Your risk for this condition increases with age. Diverticulosis is rare in people younger than 40 years. By age 80, almost everyone has it.  Eating a low-fiber diet.  Being frequently constipated.  Being overweight.  Not getting enough exercise.  Smoking.  Taking over-the-counter pain medicines, like aspirin and ibuprofen. SYMPTOMS  Most people with diverticulosis do not have symptoms. DIAGNOSIS  Because diverticulosis often has no symptoms, health care providers often discover the condition during an exam for other colon problems. In many cases, a health care provider will diagnose diverticulosis while using a flexible scope to examine the colon (colonoscopy). TREATMENT  If you have never developed an infection related to diverticulosis, you may not need treatment. If you have had an infection before, treatment may include:  Eating more fruits, vegetables, and grains.  Taking a fiber supplement.  Taking a live bacteria supplement (probiotic).  Taking medicine to relax your colon. HOME CARE INSTRUCTIONS   Drink at least  6-8 glasses of water each day to prevent constipation.  Try not to strain when you have a bowel movement.  Keep all follow-up appointments. If you have had an infection before:  Increase the fiber in your diet as directed by your health care provider or dietitian.  Take a dietary fiber supplement if your health care provider approves.  Only take medicines as directed by your health care provider. SEEK MEDICAL CARE IF:   You have abdominal pain.  You have bloating.  You have cramps.  You have not gone to the bathroom in 3 days. SEEK IMMEDIATE MEDICAL CARE IF:   Your pain gets worse.  Yourbloating becomes very bad.  You have a fever or chills, and your symptoms suddenly get worse.  You begin  vomiting.  You have bowel movements that are bloody or black. MAKE SURE YOU:  Understand these instructions.  Will watch your condition.  Will get help right away if you are not doing well or get worse.   This information is not intended to replace advice given to you by your health care provider. Make sure you discuss any questions you have with your health care provider.   Document Released: 12/22/2003 Document Revised: 03/31/2013 Document Reviewed: 02/18/2013 Elsevier Interactive Patient Education [employer].

## 2015-02-14 NOTE — Transfer of Care (Signed)
Immediate Anesthesia Transfer of Care Note  Patient: Scott [name]  Procedure(s) Performed: Procedure(s) with comments: COLONOSCOPY WITH PROPOFOL (N/A) - cecum time in 0957  time out   total time POLYPECTOMY (N/A) - sigmoid colon, rectal  Patient Location: PACU  Anesthesia Type:MAC  Level of Consciousness: awake, alert , oriented and patient cooperative  Airway & Oxygen Therapy: Patient Spontanous Breathing and Patient connected to face mask oxygen  Post-op Assessment: Report given to RN and Post -op Vital signs reviewed and stable  Post vital signs: Reviewed and stable  Last Vitals:  Filed Vitals:   02/14/15 0940  BP: 137/88  Temp:   Resp: 17    Complications: No apparent anesthesia complications

## 2015-02-14 NOTE — Anesthesia Procedure Notes (Signed)
Procedure Name: MAC Date/Time: 02/14/2015 9:43 AM Performed by: Andree Elk, Trayshawn Durkin A Pre-anesthesia Checklist: Patient identified, Timeout performed, Emergency Drugs available, Suction available and Patient being monitored Oxygen Delivery Method: Simple face mask

## 2015-02-14 NOTE — Op Note (Signed)
[facility] [address] [place], [address]   COLONOSCOPY PROCEDURE REPORT  PATIENT: Scott [name], Scott [name]  MR#: [id number] BIRTHDATE: 09-Apr-1961 , 82  yrs. old GENDER: male ENDOSCOPIST: R.  Garfield Cornea, MD FACP [facility] REFERRED VH:QIONGEXB Legrand Rams, M.D. PROCEDURE DATE:  19-Feb-2015 PROCEDURE:   Colonoscopy with snare polypectomy INDICATIONS:First ever average risk screening colonoscopy. MEDICATIONS: Deep sedation per Dr.  [facility] ASA CLASS:       Class II  CONSENT: The risks, benefits, alternatives and imponderables including but not limited to bleeding, perforation as well as the possibility of a missed lesion have been reviewed.  The potential for biopsy, lesion removal, etc. have also been discussed. Questions have been answered.  All parties agreeable.  Please see the history and physical in the medical record for more information.  DESCRIPTION OF PROCEDURE:   After the risks benefits and alternatives of the procedure were thoroughly explained, informed consent was obtained.  The digital rectal exam revealed no abnormalities of the rectum.   The     endoscope was introduced through the anus and advanced to the cecum, which was identified by both the appendix and ileocecal valve. No adverse events experienced.   The quality of the prep was adequate  The instrument was then slowly withdrawn as the colon was fully examined. Estimated blood loss is zero unless otherwise noted in this procedure report.      COLON FINDINGS: (2) 5 mm polyps in the rectum at 10 cm from the anal verge.  Otherwise, the remainder of the rectum appeared normal. (1) 5 mm polyp in the mid sigmoid segment; scattered left-sided diverticula; otherwise, the remaining colonic mucosa appeared normal.  Retroflexion was performed. The above-mentioned popliteal pulse snare removed.  Withdrawal time=  .  The scope was withdrawn and the procedure completed. COMPLICATIONS: There  were no immediate complications. EBL 3 mL ENDOSCOPIC IMPRESSION: Rectal and colonic polyps?"removed as described above.    Colonic diverticulosis.  RECOMMENDATIONS: Follow up on pathology.  eSigned:  R. Garfield Cornea, MD Rosalita Chessman [facility] 02-19-15 10:19 AM   cc:  CPT CODES: ICD CODES:  The ICD and CPT codes recommended by this software are interpretations from the data that the clinical staff has captured with the software.  The verification of the translation of this report to the ICD and CPT codes and modifiers is the sole responsibility of the health care institution and practicing physician where this report was generated.  [employer] will not be held responsible for the validity of the ICD and CPT codes included on this report.  AMA assumes no liability for data contained or not contained herein. CPT is a Designer, television/film set of the [employer].  PATIENT NAME:  Scott [name], Scott [name] MR#: [id number]

## 2015-02-14 NOTE — Interval H&P Note (Signed)
History and Physical Interval Note:  02/14/2015 8:57 AM  Scott [name]  has presented today for surgery, with the diagnosis of screening  The various methods of treatment have been discussed with the patient and family. After consideration of risks, benefits and other options for treatment, the patient has consented to  Procedure(s) with comments: COLONOSCOPY WITH PROPOFOL (N/A) - 1000 as a surgical intervention .  The patient's history has been reviewed, patient examined, no change in status, stable for surgery.  I have reviewed the patient's chart and labs.  Questions were answered to the patient's satisfaction.     Sebastin Perlmutter  No change. Screening colonoscopy per plan.  The risks, benefits, limitations, alternatives and imponderables have been reviewed with the patient. Questions have been answered. All parties are agreeable.

## 2015-02-15 ENCOUNTER — Encounter (HOSPITAL_COMMUNITY): Payer: Self-pay | Admitting: Internal Medicine

## 2015-02-16 NOTE — Progress Notes (Signed)
Quick Note:  Please let patient know gb lesion stable ? Polyp and based on size should consider elective cholecystectomy.  Fatty liver.   #1 needs LFTs  #2 send to surgery for consideration of cholecystectomy if patient agreeable. ______

## 2015-02-17 ENCOUNTER — Encounter: Payer: Self-pay | Admitting: Internal Medicine

## 2015-02-22 ENCOUNTER — Other Ambulatory Visit: Payer: Self-pay | Admitting: Gastroenterology

## 2015-02-22 ENCOUNTER — Other Ambulatory Visit: Payer: Self-pay

## 2015-02-22 DIAGNOSIS — J449 Chronic obstructive pulmonary disease, unspecified: Secondary | ICD-10-CM | POA: Diagnosis not present

## 2015-02-22 DIAGNOSIS — K760 Fatty (change of) liver, not elsewhere classified: Secondary | ICD-10-CM

## 2015-02-22 DIAGNOSIS — E1165 Type 2 diabetes mellitus with hyperglycemia: Secondary | ICD-10-CM | POA: Diagnosis not present

## 2015-03-01 DIAGNOSIS — R300 Dysuria: Secondary | ICD-10-CM | POA: Diagnosis not present

## 2015-03-09 ENCOUNTER — Other Ambulatory Visit (HOSPITAL_COMMUNITY): Payer: Self-pay | Admitting: Internal Medicine

## 2015-03-09 ENCOUNTER — Ambulatory Visit (HOSPITAL_COMMUNITY)
Admission: RE | Admit: 2015-03-09 | Discharge: 2015-03-09 | Disposition: A | Discharge status: Home / Self Care | Payer: Medicare Other | Source: Ambulatory Visit | Attending: Internal Medicine | Admitting: Internal Medicine

## 2015-03-09 ENCOUNTER — Other Ambulatory Visit: Payer: Self-pay

## 2015-03-09 DIAGNOSIS — M549 Dorsalgia, unspecified: Secondary | ICD-10-CM | POA: Diagnosis not present

## 2015-03-09 DIAGNOSIS — M5136 Other intervertebral disc degeneration, lumbar region: Secondary | ICD-10-CM | POA: Insufficient documentation

## 2015-03-09 DIAGNOSIS — K824 Cholesterolosis of gallbladder: Secondary | ICD-10-CM

## 2015-03-09 DIAGNOSIS — Z719 Counseling, unspecified: Secondary | ICD-10-CM

## 2015-03-09 DIAGNOSIS — M545 Low back pain: Secondary | ICD-10-CM | POA: Diagnosis not present

## 2015-03-09 DIAGNOSIS — N390 Urinary tract infection, site not specified: Secondary | ICD-10-CM | POA: Diagnosis not present

## 2015-03-09 DIAGNOSIS — E1165 Type 2 diabetes mellitus with hyperglycemia: Secondary | ICD-10-CM | POA: Diagnosis not present

## 2015-03-16 DIAGNOSIS — L609 Nail disorder, unspecified: Secondary | ICD-10-CM | POA: Diagnosis not present

## 2015-03-16 DIAGNOSIS — L110 Acquired keratosis follicularis: Secondary | ICD-10-CM | POA: Diagnosis not present

## 2015-03-16 DIAGNOSIS — E1142 Type 2 diabetes mellitus with diabetic polyneuropathy: Secondary | ICD-10-CM | POA: Diagnosis not present

## 2015-03-16 DIAGNOSIS — E1140 Type 2 diabetes mellitus with diabetic neuropathy, unspecified: Secondary | ICD-10-CM | POA: Diagnosis not present

## 2015-03-22 DIAGNOSIS — J449 Chronic obstructive pulmonary disease, unspecified: Secondary | ICD-10-CM | POA: Diagnosis not present

## 2015-03-22 DIAGNOSIS — E1165 Type 2 diabetes mellitus with hyperglycemia: Secondary | ICD-10-CM | POA: Diagnosis not present

## 2015-03-22 DIAGNOSIS — N390 Urinary tract infection, site not specified: Secondary | ICD-10-CM | POA: Diagnosis not present

## 2015-03-22 DIAGNOSIS — R112 Nausea with vomiting, unspecified: Secondary | ICD-10-CM | POA: Diagnosis not present

## 2015-03-23 DIAGNOSIS — Z79899 Other long term (current) drug therapy: Secondary | ICD-10-CM | POA: Diagnosis not present

## 2015-03-24 ENCOUNTER — Emergency Department (HOSPITAL_COMMUNITY): Payer: Medicare Other

## 2015-03-24 ENCOUNTER — Encounter (HOSPITAL_COMMUNITY): Payer: Self-pay | Admitting: Emergency Medicine

## 2015-03-24 ENCOUNTER — Emergency Department (HOSPITAL_COMMUNITY)
Admission: EM | Admit: 2015-03-24 | Discharge: 2015-03-24 | Disposition: A | Discharge status: Home / Self Care | Payer: Medicare Other | Attending: Emergency Medicine | Admitting: Emergency Medicine

## 2015-03-24 DIAGNOSIS — G479 Sleep disorder, unspecified: Secondary | ICD-10-CM | POA: Diagnosis not present

## 2015-03-24 DIAGNOSIS — Z8701 Personal history of pneumonia (recurrent): Secondary | ICD-10-CM | POA: Diagnosis not present

## 2015-03-24 DIAGNOSIS — R509 Fever, unspecified: Secondary | ICD-10-CM | POA: Diagnosis not present

## 2015-03-24 DIAGNOSIS — E119 Type 2 diabetes mellitus without complications: Secondary | ICD-10-CM | POA: Insufficient documentation

## 2015-03-24 DIAGNOSIS — Z79899 Other long term (current) drug therapy: Secondary | ICD-10-CM | POA: Diagnosis not present

## 2015-03-24 DIAGNOSIS — R05 Cough: Secondary | ICD-10-CM | POA: Insufficient documentation

## 2015-03-24 DIAGNOSIS — R059 Cough, unspecified: Secondary | ICD-10-CM

## 2015-03-24 DIAGNOSIS — K824 Cholesterolosis of gallbladder: Secondary | ICD-10-CM | POA: Diagnosis not present

## 2015-03-24 DIAGNOSIS — Z87828 Personal history of other (healed) physical injury and trauma: Secondary | ICD-10-CM | POA: Diagnosis not present

## 2015-03-24 DIAGNOSIS — Z7984 Long term (current) use of oral hypoglycemic drugs: Secondary | ICD-10-CM | POA: Insufficient documentation

## 2015-03-24 DIAGNOSIS — F79 Unspecified intellectual disabilities: Secondary | ICD-10-CM | POA: Diagnosis not present

## 2015-03-24 DIAGNOSIS — F319 Bipolar disorder, unspecified: Secondary | ICD-10-CM | POA: Insufficient documentation

## 2015-03-24 DIAGNOSIS — F17210 Nicotine dependence, cigarettes, uncomplicated: Secondary | ICD-10-CM | POA: Insufficient documentation

## 2015-03-24 LAB — URINALYSIS, ROUTINE W REFLEX MICROSCOPIC
Bilirubin Urine: NEGATIVE
GLUCOSE, UA: NEGATIVE mg/dL
HGB URINE DIPSTICK: NEGATIVE
Ketones, ur: NEGATIVE mg/dL
Leukocytes, UA: NEGATIVE
Nitrite: NEGATIVE
Protein, ur: NEGATIVE mg/dL
SPECIFIC GRAVITY, URINE: 1.01 (ref 1.005–1.030)
pH: 6 (ref 5.0–8.0)

## 2015-03-24 LAB — BASIC METABOLIC PANEL
Anion gap: 7 (ref 5–15)
BUN: 14 mg/dL (ref 6–20)
CHLORIDE: 104 mmol/L (ref 101–111)
CO2: 25 mmol/L (ref 22–32)
Calcium: 8.8 mg/dL — ABNORMAL LOW (ref 8.9–10.3)
Creatinine, Ser: 1.04 mg/dL (ref 0.61–1.24)
GFR calc Af Amer: >60 mL/min (ref >60)
GFR calc non Af Amer: >60 mL/min (ref >60)
GLUCOSE: 129 mg/dL — AB (ref 65–99)
POTASSIUM: 4.1 mmol/L (ref 3.5–5.1)
SODIUM: 136 mmol/L (ref 135–145)

## 2015-03-24 LAB — CBC WITH DIFFERENTIAL/PLATELET
Basophils Absolute: 0 10*3/uL (ref 0.0–0.1)
Basophils Relative: 0 %
EOS PCT: 2 %
Eosinophils Absolute: 0.2 10*3/uL (ref 0.0–0.7)
HCT: 40.7 % (ref 39.0–52.0)
Hemoglobin: 13.6 g/dL (ref 13.0–17.0)
LYMPHS ABS: 4 10*3/uL (ref 0.7–4.0)
LYMPHS PCT: 40 %
MCH: 31.6 pg (ref 26.0–34.0)
MCHC: 33.4 g/dL (ref 30.0–36.0)
MCV: 94.4 fL (ref 78.0–100.0)
MONOS PCT: 10 %
Monocytes Absolute: 1 10*3/uL (ref 0.1–1.0)
Neutro Abs: 4.8 10*3/uL (ref 1.7–7.7)
Neutrophils Relative %: 48 %
PLATELETS: 201 10*3/uL (ref 150–400)
RBC: 4.31 MIL/uL (ref 4.22–5.81)
RDW: 14.4 % (ref 11.5–15.5)
WBC: 10 10*3/uL (ref 4.0–10.5)

## 2015-03-24 NOTE — ED Notes (Signed)
Pt from [facility] with CNA reports cough, fever, altered mental status.  Pt went to pcp 2 days ago and to mental health yesterday to be evaluated.  Pt reports they checked his urine and it was clear.

## 2015-03-24 NOTE — ED Notes (Signed)
[facility] left with pt without signing him out or contacting RN.

## 2015-03-24 NOTE — ED Notes (Signed)
[facility] contacted for pt pick up.

## 2015-03-24 NOTE — Discharge Instructions (Signed)
Workup here without any significant findings no evidence of pneumonia. Labs are normal. Would recommend recheck with the behavioral health provider because his symptoms of not sleeping well and the description of some agitation although not elicited here could be related to his bipolar disorder not being well managed. Return for any new or worse symptoms. Follow-up with his doctor as needed.
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
## 2015-03-29 ENCOUNTER — Emergency Department (HOSPITAL_COMMUNITY)
Admission: EM | Admit: 2015-03-29 | Discharge: 2015-03-29 | Disposition: A | Discharge status: Home / Self Care | Payer: Medicare Other | Attending: Emergency Medicine | Admitting: Emergency Medicine

## 2015-03-29 ENCOUNTER — Encounter (HOSPITAL_COMMUNITY): Payer: Self-pay

## 2015-03-29 ENCOUNTER — Emergency Department (HOSPITAL_COMMUNITY): Payer: Medicare Other

## 2015-03-29 DIAGNOSIS — E119 Type 2 diabetes mellitus without complications: Secondary | ICD-10-CM | POA: Insufficient documentation

## 2015-03-29 DIAGNOSIS — Z79899 Other long term (current) drug therapy: Secondary | ICD-10-CM | POA: Insufficient documentation

## 2015-03-29 DIAGNOSIS — F17210 Nicotine dependence, cigarettes, uncomplicated: Secondary | ICD-10-CM | POA: Insufficient documentation

## 2015-03-29 DIAGNOSIS — Z8709 Personal history of other diseases of the respiratory system: Secondary | ICD-10-CM | POA: Diagnosis not present

## 2015-03-29 DIAGNOSIS — M1990 Unspecified osteoarthritis, unspecified site: Secondary | ICD-10-CM | POA: Insufficient documentation

## 2015-03-29 DIAGNOSIS — M7989 Other specified soft tissue disorders: Secondary | ICD-10-CM | POA: Diagnosis not present

## 2015-03-29 DIAGNOSIS — F319 Bipolar disorder, unspecified: Secondary | ICD-10-CM | POA: Diagnosis not present

## 2015-03-29 DIAGNOSIS — Z87828 Personal history of other (healed) physical injury and trauma: Secondary | ICD-10-CM | POA: Diagnosis not present

## 2015-03-29 LAB — CBC WITH DIFFERENTIAL/PLATELET
Basophils Absolute: 0 10*3/uL (ref 0.0–0.1)
Basophils Relative: 0 %
EOS PCT: 2 %
Eosinophils Absolute: 0.2 10*3/uL (ref 0.0–0.7)
HEMATOCRIT: 37.5 % — AB (ref 39.0–52.0)
Hemoglobin: 12.4 g/dL — ABNORMAL LOW (ref 13.0–17.0)
LYMPHS ABS: 3.2 10*3/uL (ref 0.7–4.0)
LYMPHS PCT: 37 %
MCH: 31.2 pg (ref 26.0–34.0)
MCHC: 33.1 g/dL (ref 30.0–36.0)
MCV: 94.2 fL (ref 78.0–100.0)
MONO ABS: 0.8 10*3/uL (ref 0.1–1.0)
Monocytes Relative: 10 %
Neutro Abs: 4.4 10*3/uL (ref 1.7–7.7)
Neutrophils Relative %: 51 %
PLATELETS: 212 10*3/uL (ref 150–400)
RBC: 3.98 MIL/uL — AB (ref 4.22–5.81)
RDW: 14.5 % (ref 11.5–15.5)
WBC: 8.6 10*3/uL (ref 4.0–10.5)

## 2015-03-29 LAB — BASIC METABOLIC PANEL
Anion gap: 10 (ref 5–15)
BUN: 19 mg/dL (ref 6–20)
CO2: 24 mmol/L (ref 22–32)
Calcium: 8.7 mg/dL — ABNORMAL LOW (ref 8.9–10.3)
Chloride: 101 mmol/L (ref 101–111)
Creatinine, Ser: 0.97 mg/dL (ref 0.61–1.24)
GFR calc Af Amer: >60 mL/min (ref >60)
GLUCOSE: 107 mg/dL — AB (ref 65–99)
POTASSIUM: 4.1 mmol/L (ref 3.5–5.1)
Sodium: 135 mmol/L (ref 135–145)

## 2015-03-29 MED ORDER — OXYCODONE-ACETAMINOPHEN 5-325 MG PO TABS
1.0000 | ORAL_TABLET | Freq: Once | ORAL | Status: AC
  Administered 2015-03-29: 1 via ORAL
  Filled 2015-03-29: qty 1

## 2015-03-29 MED ORDER — SULFAMETHOXAZOLE-TRIMETHOPRIM 800-160 MG PO TABS: 1.0000 | ORAL_TABLET | Freq: Two times a day (BID) | ORAL | Status: DC

## 2015-03-29 NOTE — ED Notes (Signed)
Pt reports bilateral lower extremity swelling for 1-2 weeks - greater on the left - pt concerned today for LLE swelling, warmth, redness. Pt resides at [facility] and a staff member from there dropped him off and instructed [place] to call them when he is ready for discharge. Ulcer present to left 4th toe - pt reports it has been there since 2004.
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
## 2015-03-29 NOTE — ED Notes (Signed)
Report called to Fontaine No at [facility]. Transport to arrive shortly per staff.

## 2015-03-29 NOTE — ED Notes (Signed)
Report called to Wells Guiles, LPN at [facility]. Awaiting transport for patient.

## 2015-04-01 ENCOUNTER — Emergency Department (HOSPITAL_COMMUNITY)
Admission: EM | Admit: 2015-04-01 | Discharge: 2015-04-01 | Disposition: A | Discharge status: Home / Self Care | Payer: Medicare Other | Attending: Emergency Medicine | Admitting: Emergency Medicine

## 2015-04-01 ENCOUNTER — Encounter (HOSPITAL_COMMUNITY): Payer: Self-pay

## 2015-04-01 DIAGNOSIS — M1990 Unspecified osteoarthritis, unspecified site: Secondary | ICD-10-CM | POA: Diagnosis not present

## 2015-04-01 DIAGNOSIS — E119 Type 2 diabetes mellitus without complications: Secondary | ICD-10-CM | POA: Insufficient documentation

## 2015-04-01 DIAGNOSIS — Z8701 Personal history of pneumonia (recurrent): Secondary | ICD-10-CM | POA: Diagnosis not present

## 2015-04-01 DIAGNOSIS — Z79899 Other long term (current) drug therapy: Secondary | ICD-10-CM | POA: Diagnosis not present

## 2015-04-01 DIAGNOSIS — F319 Bipolar disorder, unspecified: Secondary | ICD-10-CM | POA: Insufficient documentation

## 2015-04-01 DIAGNOSIS — Z7984 Long term (current) use of oral hypoglycemic drugs: Secondary | ICD-10-CM | POA: Diagnosis not present

## 2015-04-01 DIAGNOSIS — M7989 Other specified soft tissue disorders: Secondary | ICD-10-CM | POA: Insufficient documentation

## 2015-04-01 DIAGNOSIS — Z87828 Personal history of other (healed) physical injury and trauma: Secondary | ICD-10-CM | POA: Insufficient documentation

## 2015-04-01 DIAGNOSIS — F17210 Nicotine dependence, cigarettes, uncomplicated: Secondary | ICD-10-CM | POA: Diagnosis not present

## 2015-04-01 DIAGNOSIS — Z792 Long term (current) use of antibiotics: Secondary | ICD-10-CM | POA: Insufficient documentation

## 2015-04-01 MED ORDER — HYDROCODONE-ACETAMINOPHEN 5-325 MG PO TABS
1.0000 | ORAL_TABLET | Freq: Once | ORAL | Status: AC
  Administered 2015-04-01: 1 via ORAL
  Filled 2015-04-01: qty 1

## 2015-04-01 MED ORDER — HYDROCODONE-ACETAMINOPHEN 5-325 MG PO TABS: 1.0000 | ORAL_TABLET | Freq: Four times a day (QID) | ORAL | Status: DC | PRN

## 2015-04-01 MED ORDER — CEPHALEXIN 500 MG PO CAPS: 500.0000 mg | ORAL_CAPSULE | Freq: Three times a day (TID) | ORAL | Status: DC

## 2015-04-01 MED ORDER — CEPHALEXIN 500 MG PO CAPS
500.0000 mg | ORAL_CAPSULE | Freq: Once | ORAL | Status: AC
  Administered 2015-04-01: 500 mg via ORAL
  Filled 2015-04-01: qty 1

## 2015-04-01 NOTE — ED Notes (Signed)
Patient from [place]. C/O bilateral leg swelling and redness left > right

## 2015-04-01 NOTE — Discharge Instructions (Signed)
As your leg swelling, redness and pain are similar to 3 days ago I don't feel further workup is needed at this time. Your DVT study was 3 days ago. Occasionally these can be wrong and sometimes need to be repeated in about a week if you're still having symptoms not responding to antibiotics. You have started taking Bactrim, continue that course of Bactrim and add on the Keflex that I am giving you a prescription for. I will also give you a prescription for short course of pain medication however further pain meds will need to be prescribed by your primary doctor at a follow-up appointment.
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
## 2015-04-05 ENCOUNTER — Inpatient Hospital Stay (HOSPITAL_COMMUNITY)
Admission: EM | Admit: 2015-04-05 | Discharge: 2015-04-09 | DRG: 603 | Disposition: A | Discharge status: Skilled Nursing Facility | Payer: Medicare Other | Attending: Internal Medicine | Admitting: Internal Medicine

## 2015-04-05 ENCOUNTER — Encounter (HOSPITAL_COMMUNITY): Payer: Self-pay | Admitting: *Deleted

## 2015-04-05 DIAGNOSIS — K760 Fatty (change of) liver, not elsewhere classified: Secondary | ICD-10-CM | POA: Diagnosis present

## 2015-04-05 DIAGNOSIS — E1142 Type 2 diabetes mellitus with diabetic polyneuropathy: Secondary | ICD-10-CM | POA: Diagnosis not present

## 2015-04-05 DIAGNOSIS — L03116 Cellulitis of left lower limb: Principal | ICD-10-CM | POA: Diagnosis present

## 2015-04-05 DIAGNOSIS — Z7984 Long term (current) use of oral hypoglycemic drugs: Secondary | ICD-10-CM

## 2015-04-05 DIAGNOSIS — Z8701 Personal history of pneumonia (recurrent): Secondary | ICD-10-CM | POA: Diagnosis not present

## 2015-04-05 DIAGNOSIS — M1990 Unspecified osteoarthritis, unspecified site: Secondary | ICD-10-CM | POA: Diagnosis present

## 2015-04-05 DIAGNOSIS — F319 Bipolar disorder, unspecified: Secondary | ICD-10-CM | POA: Diagnosis present

## 2015-04-05 DIAGNOSIS — Z800 Family history of malignant neoplasm of digestive organs: Secondary | ICD-10-CM

## 2015-04-05 DIAGNOSIS — I10 Essential (primary) hypertension: Secondary | ICD-10-CM | POA: Diagnosis not present

## 2015-04-05 DIAGNOSIS — F418 Other specified anxiety disorders: Secondary | ICD-10-CM | POA: Diagnosis present

## 2015-04-05 DIAGNOSIS — F79 Unspecified intellectual disabilities: Secondary | ICD-10-CM | POA: Diagnosis present

## 2015-04-05 DIAGNOSIS — M79662 Pain in left lower leg: Secondary | ICD-10-CM | POA: Diagnosis present

## 2015-04-05 DIAGNOSIS — E119 Type 2 diabetes mellitus without complications: Secondary | ICD-10-CM | POA: Diagnosis present

## 2015-04-05 DIAGNOSIS — F17210 Nicotine dependence, cigarettes, uncomplicated: Secondary | ICD-10-CM | POA: Diagnosis present

## 2015-04-05 DIAGNOSIS — E1165 Type 2 diabetes mellitus with hyperglycemia: Secondary | ICD-10-CM | POA: Diagnosis not present

## 2015-04-05 DIAGNOSIS — L02419 Cutaneous abscess of limb, unspecified: Secondary | ICD-10-CM | POA: Diagnosis not present

## 2015-04-05 DIAGNOSIS — F3130 Bipolar disorder, current episode depressed, mild or moderate severity, unspecified: Secondary | ICD-10-CM | POA: Diagnosis not present

## 2015-04-05 DIAGNOSIS — E08621 Diabetes mellitus due to underlying condition with foot ulcer: Secondary | ICD-10-CM | POA: Diagnosis not present

## 2015-04-05 LAB — CBC WITH DIFFERENTIAL/PLATELET
Basophils Absolute: 0 10*3/uL (ref 0.0–0.1)
Basophils Relative: 0 %
EOS ABS: 0.3 10*3/uL (ref 0.0–0.7)
Eosinophils Relative: 3 %
HEMATOCRIT: 36.6 % — AB (ref 39.0–52.0)
HEMOGLOBIN: 12.6 g/dL — AB (ref 13.0–17.0)
LYMPHS ABS: 3.8 10*3/uL (ref 0.7–4.0)
LYMPHS PCT: 42 %
MCH: 32.3 pg (ref 26.0–34.0)
MCHC: 34.4 g/dL (ref 30.0–36.0)
MCV: 93.8 fL (ref 78.0–100.0)
MONOS PCT: 10 %
Monocytes Absolute: 1 10*3/uL (ref 0.1–1.0)
NEUTROS ABS: 4.1 10*3/uL (ref 1.7–7.7)
NEUTROS PCT: 45 %
Platelets: 318 10*3/uL (ref 150–400)
RBC: 3.9 MIL/uL — AB (ref 4.22–5.81)
RDW: 14.5 % (ref 11.5–15.5)
WBC: 9.2 10*3/uL (ref 4.0–10.5)

## 2015-04-05 LAB — COMPREHENSIVE METABOLIC PANEL
ALK PHOS: 49 U/L (ref 38–126)
ALT: 21 U/L (ref 17–63)
ANION GAP: 8 (ref 5–15)
AST: 25 U/L (ref 15–41)
Albumin: 3.3 g/dL — ABNORMAL LOW (ref 3.5–5.0)
BILIRUBIN TOTAL: 0.4 mg/dL (ref 0.3–1.2)
BUN: 16 mg/dL (ref 6–20)
CALCIUM: 9 mg/dL (ref 8.9–10.3)
CO2: 24 mmol/L (ref 22–32)
CREATININE: 0.94 mg/dL (ref 0.61–1.24)
Chloride: 105 mmol/L (ref 101–111)
Glucose, Bld: 126 mg/dL — ABNORMAL HIGH (ref 65–99)
Potassium: 4.5 mmol/L (ref 3.5–5.1)
Sodium: 137 mmol/L (ref 135–145)
TOTAL PROTEIN: 6.8 g/dL (ref 6.5–8.1)

## 2015-04-05 LAB — GLUCOSE, CAPILLARY: GLUCOSE-CAPILLARY: 127 mg/dL — AB (ref 65–99)

## 2015-04-05 LAB — MRSA PCR SCREENING: MRSA by PCR: NEGATIVE

## 2015-04-05 LAB — CBG MONITORING, ED: Glucose-Capillary: 149 mg/dL — ABNORMAL HIGH (ref 65–99)

## 2015-04-05 MED ORDER — RISPERIDONE 1 MG PO TABS
1.0000 mg | ORAL_TABLET | Freq: Two times a day (BID) | ORAL | Status: DC
  Administered 2015-04-05 – 2015-04-09 (×8): 1 mg via ORAL
  Filled 2015-04-05 (×4): qty 1
  Filled 2015-04-05: qty 2
  Filled 2015-04-05 (×3): qty 1

## 2015-04-05 MED ORDER — ALBUTEROL SULFATE (2.5 MG/3ML) 0.083% IN NEBU
3.0000 mL | INHALATION_SOLUTION | RESPIRATORY_TRACT | Status: DC | PRN
  Administered 2015-04-06: 3 mL via RESPIRATORY_TRACT
  Filled 2015-04-05: qty 3

## 2015-04-05 MED ORDER — VANCOMYCIN HCL 10 G IV SOLR
1500.0000 mg | Freq: Once | INTRAVENOUS | Status: AC
  Administered 2015-04-05: 1500 mg via INTRAVENOUS
  Filled 2015-04-05: qty 1500

## 2015-04-05 MED ORDER — ACETAMINOPHEN 500 MG PO TABS
500.0000 mg | ORAL_TABLET | Freq: Every day | ORAL | Status: DC
  Administered 2015-04-05 – 2015-04-09 (×5): 500 mg via ORAL
  Filled 2015-04-05 (×5): qty 1

## 2015-04-05 MED ORDER — METFORMIN HCL ER 500 MG PO TB24: 1000.0000 mg | ORAL_TABLET | Freq: Two times a day (BID) | ORAL | Status: DC

## 2015-04-05 MED ORDER — ONDANSETRON HCL 4 MG/2ML IJ SOLN
4.0000 mg | Freq: Four times a day (QID) | INTRAMUSCULAR | Status: DC | PRN
  Administered 2015-04-07: 4 mg via INTRAVENOUS
  Filled 2015-04-05: qty 2

## 2015-04-05 MED ORDER — LINAGLIPTIN 5 MG PO TABS
5.0000 mg | ORAL_TABLET | Freq: Every day | ORAL | Status: DC
  Administered 2015-04-06 – 2015-04-09 (×4): 5 mg via ORAL
  Filled 2015-04-05 (×4): qty 1

## 2015-04-05 MED ORDER — LISINOPRIL 5 MG PO TABS
5.0000 mg | ORAL_TABLET | Freq: Every day | ORAL | Status: DC
  Administered 2015-04-05 – 2015-04-09 (×5): 5 mg via ORAL
  Filled 2015-04-05 (×5): qty 1

## 2015-04-05 MED ORDER — ATORVASTATIN CALCIUM 10 MG PO TABS
10.0000 mg | ORAL_TABLET | Freq: Every day | ORAL | Status: DC
Start: 2015-04-05 — End: 2015-04-09
  Administered 2015-04-05 – 2015-04-09 (×5): 10 mg via ORAL
  Filled 2015-04-05 (×5): qty 1

## 2015-04-05 MED ORDER — RISPERIDONE 0.5 MG PO TABS: 0.5000 mg | ORAL_TABLET | Freq: Two times a day (BID) | ORAL | Status: DC

## 2015-04-05 MED ORDER — TRAZODONE HCL 50 MG PO TABS
50.0000 mg | ORAL_TABLET | Freq: Every day | ORAL | Status: DC
  Administered 2015-04-05 – 2015-04-08 (×4): 50 mg via ORAL
  Filled 2015-04-05 (×4): qty 1

## 2015-04-05 MED ORDER — CLINDAMYCIN PHOSPHATE 600 MG/50ML IV SOLN
600.0000 mg | Freq: Once | INTRAVENOUS | Status: AC
  Administered 2015-04-05: 600 mg via INTRAVENOUS
  Filled 2015-04-05: qty 50

## 2015-04-05 MED ORDER — ENOXAPARIN SODIUM 60 MG/0.6ML ~~LOC~~ SOLN
50.0000 mg | SUBCUTANEOUS | Status: DC
  Administered 2015-04-05 – 2015-04-08 (×4): 50 mg via SUBCUTANEOUS
  Filled 2015-04-05 (×4): qty 0.6

## 2015-04-05 MED ORDER — METFORMIN HCL ER 500 MG PO TB24
1000.0000 mg | ORAL_TABLET | Freq: Two times a day (BID) | ORAL | Status: DC
  Administered 2015-04-05 – 2015-04-09 (×8): 1000 mg via ORAL
  Filled 2015-04-05 (×8): qty 2

## 2015-04-05 MED ORDER — INSULIN ASPART 100 UNIT/ML ~~LOC~~ SOLN
0.0000 [IU] | Freq: Three times a day (TID) | SUBCUTANEOUS | Status: DC
  Administered 2015-04-07 – 2015-04-08 (×3): 1 [IU] via SUBCUTANEOUS

## 2015-04-05 MED ORDER — ONDANSETRON HCL 4 MG PO TABS: 4.0000 mg | ORAL_TABLET | Freq: Four times a day (QID) | ORAL | Status: DC | PRN

## 2015-04-05 MED ORDER — TIOTROPIUM BROMIDE MONOHYDRATE 18 MCG IN CAPS
18.0000 ug | ORAL_CAPSULE | Freq: Every day | RESPIRATORY_TRACT | Status: DC
  Administered 2015-04-06 – 2015-04-09 (×4): 18 ug via RESPIRATORY_TRACT
  Filled 2015-04-05: qty 5

## 2015-04-05 MED ORDER — HYDROCODONE-ACETAMINOPHEN 5-325 MG PO TABS
1.0000 | ORAL_TABLET | Freq: Four times a day (QID) | ORAL | Status: DC | PRN
  Administered 2015-04-06 – 2015-04-08 (×4): 1 via ORAL
  Filled 2015-04-05 (×4): qty 1

## 2015-04-05 MED ORDER — DIVALPROEX SODIUM 250 MG PO DR TAB
500.0000 mg | DELAYED_RELEASE_TABLET | Freq: Two times a day (BID) | ORAL | Status: DC
  Administered 2015-04-05 – 2015-04-09 (×8): 500 mg via ORAL
  Filled 2015-04-05 (×8): qty 2

## 2015-04-05 MED ORDER — VANCOMYCIN HCL IN DEXTROSE 1-5 GM/200ML-% IV SOLN
1000.0000 mg | Freq: Two times a day (BID) | INTRAVENOUS | Status: DC
  Administered 2015-04-06 – 2015-04-09 (×7): 1000 mg via INTRAVENOUS
  Filled 2015-04-05 (×5): qty 200

## 2015-04-05 MED ORDER — INSULIN ASPART 100 UNIT/ML ~~LOC~~ SOLN: 0.0000 [IU] | Freq: Every day | SUBCUTANEOUS | Status: DC
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
## 2015-04-05 NOTE — ED Notes (Signed)
Pt reports leg swelling to bilateral legs, states he has been here several times this month for same, no new problems but not improving.

## 2015-04-05 NOTE — Progress Notes (Signed)
Pharmacy Clarification  Adjusted lovenox to 50 mg sq q24h based on BMI>25 (BMI = 27.5 with ht 6'2" and wt 98 kg).  Plan - Lovenox 0.5 mg/kg sq q24h for DVT pxl = 50 mg sq q24h   ThanksAntionette Char PharmD BCPS 04/05/2015 6:28 PM
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
## 2015-04-06 LAB — GLUCOSE, CAPILLARY
GLUCOSE-CAPILLARY: 109 mg/dL — AB (ref 65–99)
GLUCOSE-CAPILLARY: 87 mg/dL (ref 65–99)
GLUCOSE-CAPILLARY: 111 mg/dL — AB (ref 65–99)
Glucose-Capillary: 115 mg/dL — ABNORMAL HIGH (ref 65–99)
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
## 2015-04-06 NOTE — Progress Notes (Signed)
Subjective: Patient was admitted with extensive cellulitis of left leg. He was to ER several times and oral antibiotics was tried but the symptom got worse. He is started on combinations on admission. He feels better this morning.  Objective: Vital signs in last 24 hours: Temp:  [97.7 F (36.5 C)-98.4 F (36.9 C)] 98.4 F (36.9 C) (12/28 0511) Pulse Rate:  [88-101] 90 (12/28 0511) Resp:  [14-18] 18 (12/28 0511) BP: (118-139)/(71-85) 130/85 mmHg (12/28 0511) SpO2:  [94 %-100 %] 99 % (12/28 0511) Weight:  [93.123 kg (205 lb 4.8 oz)-97.977 kg (216 lb)] 93.123 kg (205 lb 4.8 oz) (12/27 1828) Weight change:  Last BM Date: 04/05/15  Intake/Output from previous day: 12/27 0701 - 12/28 0700 In: 1220 [P.O.:720; IV Piggyback:500] Out: 3200 [Urine:3200]  PHYSICAL EXAM General appearance: alert and no distress Resp: clear to auscultation bilaterally Cardio: S1, S2 normal GI: soft, non-tender; bowel sounds normal; no masses,  no organomegaly Extremities: diffuse redness, swelling and tenderness of the left leg from ankl;e to the knee  Lab Results:  Results for orders placed or performed during the hospital encounter of 04/05/15 (from the past 48 hour(s))  CBC with Differential     Status: Abnormal   Collection Time: 04/05/15  1:45 PM  Result Value Ref Range   WBC 9.2 4.0 - 10.5 K/uL   RBC 3.90 (L) 4.22 - 5.81 MIL/uL   Hemoglobin 12.6 (L) 13.0 - 17.0 g/dL   HCT 36.6 (L) 39.0 - 52.0 %   MCV 93.8 78.0 - 100.0 fL   MCH 32.3 26.0 - 34.0 pg   MCHC 34.4 30.0 - 36.0 g/dL   RDW 14.5 11.5 - 15.5 %   Platelets 318 150 - 400 K/uL   Neutrophils Relative % 45 %   Neutro Abs 4.1 1.7 - 7.7 K/uL   Lymphocytes Relative 42 %   Lymphs Abs 3.8 0.7 - 4.0 K/uL   Monocytes Relative 10 %   Monocytes Absolute 1.0 0.1 - 1.0 K/uL   Eosinophils Relative 3 %   Eosinophils Absolute 0.3 0.0 - 0.7 K/uL   Basophils Relative 0 %   Basophils Absolute 0.0 0.0 - 0.1 K/uL  Comprehensive metabolic panel     Status:  Abnormal   Collection Time: 04/05/15  1:45 PM  Result Value Ref Range   Sodium 137 135 - 145 mmol/L   Potassium 4.5 3.5 - 5.1 mmol/L   Chloride 105 101 - 111 mmol/L   CO2 24 22 - 32 mmol/L   Glucose, Bld 126 (H) 65 - 99 mg/dL   BUN 16 6 - 20 mg/dL   Creatinine, Ser 0.94 0.61 - 1.24 mg/dL   Calcium 9.0 8.9 - 10.3 mg/dL   Total Protein 6.8 6.5 - 8.1 g/dL   Albumin 3.3 (L) 3.5 - 5.0 g/dL   AST 25 15 - 41 U/L   ALT 21 17 - 63 U/L   Alkaline Phosphatase 49 38 - 126 U/L   Total Bilirubin 0.4 0.3 - 1.2 mg/dL   GFR calc non Af Amer >60 >60 mL/min   GFR calc Af Amer >60 >60 mL/min    Comment: (NOTE) The eGFR has been calculated using the CKD EPI equation. This calculation has not been validated in all clinical situations. eGFR's persistently <60 mL/min signify possible Chronic Kidney Disease.    Anion gap 8 5 - 15  CBG monitoring, ED     Status: Abnormal   Collection Time: 04/05/15  5:43 PM  Result Value Ref Range  Glucose-Capillary 149 (H) 65 - 99 mg/dL  MRSA PCR Screening     Status: None   Collection Time: 04/05/15  6:30 PM  Result Value Ref Range   MRSA by PCR NEGATIVE NEGATIVE    Comment:        The GeneXpert MRSA Assay (FDA approved for NASAL specimens only), is one component of a comprehensive MRSA colonization surveillance program. It is not intended to diagnose MRSA infection nor to guide or monitor treatment for MRSA infections.   Glucose, capillary     Status: Abnormal   Collection Time: 04/05/15  8:27 PM  Result Value Ref Range   Glucose-Capillary 127 (H) 65 - 99 mg/dL   Comment 1 Notify RN    Comment 2 Document in Chart   Glucose, capillary     Status: Abnormal   Collection Time: 04/06/15  7:47 AM  Result Value Ref Range   Glucose-Capillary 109 (H) 65 - 99 mg/dL   Comment 1 Notify RN     ABGS No results for input(s): PHART, PO2ART, TCO2, HCO3 in the last 72 hours.  Invalid input(s): PCO2 CULTURES Recent Results (from the past 240 hour(s))  MRSA PCR  Screening     Status: None   Collection Time: 04/05/15  6:30 PM  Result Value Ref Range Status   MRSA by PCR NEGATIVE NEGATIVE Final    Comment:        The GeneXpert MRSA Assay (FDA approved for NASAL specimens only), is one component of a comprehensive MRSA colonization surveillance program. It is not intended to diagnose MRSA infection nor to guide or monitor treatment for MRSA infections.    Studies/Results: No results found.  Medications: I have reviewed the patient's current medications.  Assesment:   Principal Problem:   Left leg cellulitis Active Problems:   Fatty liver   Mental retardation   Cellulitis Dibetes Mellitus type 2   Plan:  Medications reviewed Continue combinations IV antibiotics Continue insulin therapy to achieve glycemic control    LOS: 1 day   Vyla Pint 04/06/2015, 7:51 AM

## 2015-04-06 NOTE — Care Management Note (Signed)
Case Management Note  Patient Details  Name: Scott [name] MRN: [id number] Date of Birth: 1960/06/10  Subjective/Objective:                  Pt admitted from [facility] with cellulitis. Pt will return to facility at discharge.  Action/Plan: CSW is aware and will arrange discharge to facility when medically stable.  Expected Discharge Date:                  Expected Discharge Plan:  Assisted Living / Rest Home  In-House Referral:  Clinical Social Work  Discharge planning Services  CM Consult  Post Acute Care Choice:  NA Choice offered to:  NA  DME Arranged:    DME Agency:     HH Arranged:    [place] Agency:     Status of Service:  Completed, signed off  Medicare Important Message Given:    Date Medicare IM Given:    Medicare IM give by:    Date Additional Medicare IM Given:    Additional Medicare Important Message give by:     If discussed at [place] of Stay Meetings, dates discussed:    Additional Comments:  Joylene Draft, RN 04/06/2015, 8:30 AM

## 2015-04-07 LAB — GLUCOSE, CAPILLARY
GLUCOSE-CAPILLARY: 114 mg/dL — AB (ref 65–99)
Glucose-Capillary: 138 mg/dL — ABNORMAL HIGH (ref 65–99)
Glucose-Capillary: 129 mg/dL — ABNORMAL HIGH (ref 65–99)
Glucose-Capillary: 127 mg/dL — ABNORMAL HIGH (ref 65–99)

## 2015-04-07 NOTE — Progress Notes (Signed)
Clothes in med room in a bag with name, in burgundy paper clothes, ambulating in hall, being moved to Room 319 with sitter.

## 2015-04-07 NOTE — Clinical Social Work Note (Signed)
Clinical Social Work Assessment  Patient Details  Name: RAYWOOD AXON MRN: [id number] Date of Birth: [birth year]  Date of referral:  04/07/15               Reason for consult:  Discharge Planning                Permission sought to share information with:  Guardian Permission granted to share information::     Name::     [name]  Agency::  [facility]  Relationship::  guardian  Contact Information:     Housing/Transportation Living arrangements for the past 2 months:  [place] of Information:  Guardian Patient Interpreter Needed:  None Criminal Activity/Legal Involvement Pertinent to Current Situation/Hospitalization:  No - Comment as needed Significant Relationships:  Siblings Lives with:  Self Do you feel safe going back to the place where you live?  Yes Need for family participation in patient care:  No (Coment)  Care giving concerns:  Pt is long term resident at [place].    Social Worker assessment / plan:  CSW spoke with Butch Penny at [place] regarding pt. She reports pt has been a resident there for several years. His guardian is [name] at [place] [phone], [phone]). Pt requires assist with bathing and dressing. He ambulates independently. Okay for return. Per Rip Harbour, they have been guardian since 2009 and placed pt at  [facility] at that time. He has a sister, Helene Kelp who can have information.   Employment status:  Retired Nurse, adult PT Recommendations:  Not assessed at this time Information / Referral to community resources:  Other (Comment Required) (return to [facility])  Patient/Family's Response to care:  Pt's guardian requests return to [facility] when medically stable.   Patient/Family's Understanding of and Emotional Response to Diagnosis, Current Treatment, and Prognosis:  Pt's guardian is aware of medical history.   Emotional Assessment Appearance:  Appears stated  age Attitude/Demeanor/Rapport:  Unable to Assess Affect (typically observed):  Unable to Assess Orientation:    Alcohol / Substance use:  Not Applicable Psych involvement (Current and /or in the community):  No (Comment)  Discharge Needs  Concerns to be addressed:  Discharge Planning Concerns Readmission within the last 30 days:  No Current discharge risk:  None Barriers to Discharge:  Continued Medical Work up   [name], [place] 04/07/2015, 9:42 AM [phone]

## 2015-04-07 NOTE — Progress Notes (Signed)
Subjective: Patient is receiving combinations of IV antibiotics. He is complaining of pain of the left leg.  Objective: Vital signs in last 24 hours: Temp:  [98.4 F (36.9 C)-99.1 F (37.3 C)] 98.4 F (36.9 C) (12/29 0557) Pulse Rate:  [74-101] 74 (12/29 0557) Resp:  [16-18] 18 (12/29 0557) BP: (120-128)/(68-83) 120/74 mmHg (12/29 0557) SpO2:  [94 %-95 %] 94 % (12/29 0557) Weight change:  Last BM Date: 04/05/15  Intake/Output from previous day: 12/28 0701 - 12/29 0700 In: 1320 [P.O.:1320] Out: 4350 [Urine:4350]  PHYSICAL EXAM General appearance: alert and no distress Resp: clear to auscultation bilaterally Cardio: S1, S2 normal GI: soft, non-tender; bowel sounds normal; no masses,  no organomegaly Extremities: diffuse redness, swelling and tenderness of the left leg from ankl;e to the knee  Lab Results:  Results for orders placed or performed during the hospital encounter of 04/05/15 (from the past 48 hour(s))  CBC with Differential     Status: Abnormal   Collection Time: 04/05/15  1:45 PM  Result Value Ref Range   WBC 9.2 4.0 - 10.5 K/uL   RBC 3.90 (L) 4.22 - 5.81 MIL/uL   Hemoglobin 12.6 (L) 13.0 - 17.0 g/dL   HCT 36.6 (L) 39.0 - 52.0 %   MCV 93.8 78.0 - 100.0 fL   MCH 32.3 26.0 - 34.0 pg   MCHC 34.4 30.0 - 36.0 g/dL   RDW 14.5 11.5 - 15.5 %   Platelets 318 150 - 400 K/uL   Neutrophils Relative % 45 %   Neutro Abs 4.1 1.7 - 7.7 K/uL   Lymphocytes Relative 42 %   Lymphs Abs 3.8 0.7 - 4.0 K/uL   Monocytes Relative 10 %   Monocytes Absolute 1.0 0.1 - 1.0 K/uL   Eosinophils Relative 3 %   Eosinophils Absolute 0.3 0.0 - 0.7 K/uL   Basophils Relative 0 %   Basophils Absolute 0.0 0.0 - 0.1 K/uL  Comprehensive metabolic panel     Status: Abnormal   Collection Time: 04/05/15  1:45 PM  Result Value Ref Range   Sodium 137 135 - 145 mmol/L   Potassium 4.5 3.5 - 5.1 mmol/L   Chloride 105 101 - 111 mmol/L   CO2 24 22 - 32 mmol/L   Glucose, Bld 126 (H) 65 - 99 mg/dL   BUN 16 6 - 20 mg/dL   Creatinine, Ser 0.94 0.61 - 1.24 mg/dL   Calcium 9.0 8.9 - 10.3 mg/dL   Total Protein 6.8 6.5 - 8.1 g/dL   Albumin 3.3 (L) 3.5 - 5.0 g/dL   AST 25 15 - 41 U/L   ALT 21 17 - 63 U/L   Alkaline Phosphatase 49 38 - 126 U/L   Total Bilirubin 0.4 0.3 - 1.2 mg/dL   GFR calc non Af Amer >60 >60 mL/min   GFR calc Af Amer >60 >60 mL/min    Comment: (NOTE) The eGFR has been calculated using the CKD EPI equation. This calculation has not been validated in all clinical situations. eGFR's persistently <60 mL/min signify possible Chronic Kidney Disease.    Anion gap 8 5 - 15  CBG monitoring, ED     Status: Abnormal   Collection Time: 04/05/15  5:43 PM  Result Value Ref Range   Glucose-Capillary 149 (H) 65 - 99 mg/dL  MRSA PCR Screening     Status: None   Collection Time: 04/05/15  6:30 PM  Result Value Ref Range   MRSA by PCR NEGATIVE NEGATIVE    Comment:  The GeneXpert MRSA Assay (FDA approved for NASAL specimens only), is one component of a comprehensive MRSA colonization surveillance program. It is not intended to diagnose MRSA infection nor to guide or monitor treatment for MRSA infections.   Glucose, capillary     Status: Abnormal   Collection Time: 04/05/15  8:27 PM  Result Value Ref Range   Glucose-Capillary 127 (H) 65 - 99 mg/dL   Comment 1 Notify RN    Comment 2 Document in Chart   Glucose, capillary     Status: Abnormal   Collection Time: 04/06/15  7:47 AM  Result Value Ref Range   Glucose-Capillary 109 (H) 65 - 99 mg/dL   Comment 1 Notify RN   Glucose, capillary     Status: None   Collection Time: 04/06/15 11:08 AM  Result Value Ref Range   Glucose-Capillary 87 65 - 99 mg/dL   Comment 1 Notify RN   Glucose, capillary     Status: Abnormal   Collection Time: 04/06/15  4:41 PM  Result Value Ref Range   Glucose-Capillary 111 (H) 65 - 99 mg/dL   Comment 1 Notify RN   Glucose, capillary     Status: Abnormal   Collection Time: 04/06/15  8:36  PM  Result Value Ref Range   Glucose-Capillary 115 (H) 65 - 99 mg/dL   Comment 1 Notify RN    Comment 2 Document in Chart   Glucose, capillary     Status: Abnormal   Collection Time: 04/07/15  7:31 AM  Result Value Ref Range   Glucose-Capillary 138 (H) 65 - 99 mg/dL   Comment 1 Notify RN     ABGS No results for input(s): PHART, PO2ART, TCO2, HCO3 in the last 72 hours.  Invalid input(s): PCO2 CULTURES Recent Results (from the past 240 hour(s))  MRSA PCR Screening     Status: None   Collection Time: 04/05/15  6:30 PM  Result Value Ref Range Status   MRSA by PCR NEGATIVE NEGATIVE Final    Comment:        The GeneXpert MRSA Assay (FDA approved for NASAL specimens only), is one component of a comprehensive MRSA colonization surveillance program. It is not intended to diagnose MRSA infection nor to guide or monitor treatment for MRSA infections.    Studies/Results: No results found.  Medications: I have reviewed the patient's current medications.  Assesment:   Principal Problem:   Left leg cellulitis Active Problems:   Fatty liver   Mental retardation   Cellulitis Dibetes Mellitus type 2   Plan:  Medications reviewed Continue combinations IV antibiotics Will start on norco 5/325 1 tab po q 6 hours PRN continue regular medications.    LOS: 2 days   Bonham Zingale 04/07/2015, 8:05 AM

## 2015-04-07 NOTE — Clinical Social Work Note (Signed)
CSW received call from [facility] who is requesting psych consult due to some recent behaviors. Unsure if related to medical condition. MD notified.   Benay Pike, [place]

## 2015-04-08 DIAGNOSIS — F3130 Bipolar disorder, current episode depressed, mild or moderate severity, unspecified: Secondary | ICD-10-CM

## 2015-04-08 LAB — GLUCOSE, CAPILLARY
GLUCOSE-CAPILLARY: 116 mg/dL — AB (ref 65–99)
GLUCOSE-CAPILLARY: 119 mg/dL — AB (ref 65–99)
Glucose-Capillary: 99 mg/dL (ref 65–99)
Glucose-Capillary: 148 mg/dL — ABNORMAL HIGH (ref 65–99)

## 2015-04-08 LAB — VANCOMYCIN, TROUGH: Vancomycin Tr: 14 ug/mL (ref 10.0–20.0)

## 2015-04-08 NOTE — Progress Notes (Signed)
Subjective: Patient is alert and awake but confused and disoriented. He is receiving IV antibiotics. He is complaining of pain in his legs.  Objective: Vital signs in last 24 hours: Temp:  [97.5 F (36.4 C)-98.6 F (37 C)] 97.5 F (36.4 C) (12/30 0440) Pulse Rate:  [101-115] 102 (12/30 0440) Resp:  [18] 18 (12/30 0440) BP: (132-139)/(79-86) 135/79 mmHg (12/30 0440) SpO2:  [93 %-97 %] 96 % (12/30 0440) Weight change:  Last BM Date: 04/06/15  Intake/Output from previous day: 12/29 0701 - 12/30 0700 In: 2280 [P.O.:2280] Out: 1050 [Urine:1050]  PHYSICAL EXAM General appearance: alert and no distress Resp: clear to auscultation bilaterally Cardio: S1, S2 normal GI: soft, non-tender; bowel sounds normal; no masses,  no organomegaly Extremities: diffuse redness, swelling and tenderness of the left leg from ankl;e to the knee  Lab Results:  Results for orders placed or performed during the hospital encounter of 04/05/15 (from the past 48 hour(s))  Glucose, capillary     Status: None   Collection Time: 04/06/15 11:08 AM  Result Value Ref Range   Glucose-Capillary 87 65 - 99 mg/dL   Comment 1 Notify RN   Glucose, capillary     Status: Abnormal   Collection Time: 04/06/15  4:41 PM  Result Value Ref Range   Glucose-Capillary 111 (H) 65 - 99 mg/dL   Comment 1 Notify RN   Glucose, capillary     Status: Abnormal   Collection Time: 04/06/15  8:36 PM  Result Value Ref Range   Glucose-Capillary 115 (H) 65 - 99 mg/dL   Comment 1 Notify RN    Comment 2 Document in Chart   Glucose, capillary     Status: Abnormal   Collection Time: 04/07/15  7:31 AM  Result Value Ref Range   Glucose-Capillary 138 (H) 65 - 99 mg/dL   Comment 1 Notify RN   Glucose, capillary     Status: Abnormal   Collection Time: 04/07/15 11:32 AM  Result Value Ref Range   Glucose-Capillary 114 (H) 65 - 99 mg/dL   Comment 1 Notify RN   Glucose, capillary     Status: Abnormal   Collection Time: 04/07/15  4:59 PM   Result Value Ref Range   Glucose-Capillary 129 (H) 65 - 99 mg/dL  Glucose, capillary     Status: Abnormal   Collection Time: 04/07/15  8:34 PM  Result Value Ref Range   Glucose-Capillary 127 (H) 65 - 99 mg/dL   Comment 1 Notify RN    Comment 2 Document in Chart   Vancomycin, trough     Status: None   Collection Time: 04/08/15  7:27 AM  Result Value Ref Range   Vancomycin Tr 14 10.0 - 20.0 ug/mL  Glucose, capillary     Status: Abnormal   Collection Time: 04/08/15  7:49 AM  Result Value Ref Range   Glucose-Capillary 116 (H) 65 - 99 mg/dL   Comment 1 Document in Chart     ABGS No results for input(s): PHART, PO2ART, TCO2, HCO3 in the last 72 hours.  Invalid input(s): PCO2 CULTURES Recent Results (from the past 240 hour(s))  MRSA PCR Screening     Status: None   Collection Time: 04/05/15  6:30 PM  Result Value Ref Range Status   MRSA by PCR NEGATIVE NEGATIVE Final    Comment:        The GeneXpert MRSA Assay (FDA approved for NASAL specimens only), is one component of a comprehensive MRSA colonization surveillance program. It is not  intended to diagnose MRSA infection nor to guide or monitor treatment for MRSA infections.    Studies/Results: No results found.  Medications: I have reviewed the patient's current medications.  Assesment:   Principal Problem:   Left leg cellulitis Active Problems:   Fatty liver   Mental retardation   Cellulitis Dibetes Mellitus type 2   Plan:  Medications reviewed Continue combinations IV antibiotics Psychiatry consuilt continue regular medications.    LOS: 3 days   Laurenashley Viar 04/08/2015, 8:06 AM

## 2015-04-08 NOTE — Clinical Social Work Placement (Signed)
   CLINICAL SOCIAL WORK PLACEMENT  NOTE  Date:  04/08/2015  Patient Details  Name: Scott [name] MRN: [id number] Date of Birth: April 16, 1960  Clinical Social Work is seeking post-discharge placement for this patient at the [place] level of care (*CSW will initial, date and re-position this form in  chart as items are completed):  Yes   Patient/family provided with [place] Work Department's list of facilities offering this level of care within the geographic area requested by the patient (or if unable, by the patient's family).  Yes   Patient/family informed of their freedom to choose among providers that offer the needed level of care, that participate in Medicare, Medicaid or managed care program needed by the patient, have an available bed and are willing to accept the patient.  Yes   Patient/family informed of [facility]'s ownership interest in [facility] and [facility], as well as of the fact that they are under no obligation to receive care at these facilities.  PASRR submitted to EDS on       PASRR number received on       Existing PASRR number confirmed on 04/08/15     FL2 transmitted to all facilities in geographic area requested by pt/family on 04/08/15     FL2 transmitted to all facilities within larger geographic area on       Patient informed that his/her managed care company has contracts with or will negotiate with certain facilities, including the following:            Patient/family informed of bed offers received.  Patient chooses bed at       Physician recommends and patient chooses bed at      Patient to be transferred to   on  .  Patient to be transferred to facility by       Patient family notified on   of transfer.  Name of family member notified:        PHYSICIAN       Additional Comment:    _______________________________________________ [name], LCSW 04/08/2015, 9:28  AM [phone]

## 2015-04-08 NOTE — Clinical Social Work Note (Signed)
Per CSW disposition, pt is psychiatrically cleared. CSW notified guardian, [name]. Also discussed SNF for IV antibiotics. Referral faxed to SNFs and Rip Harbour chooses bed at [facility]. Facility aware of above and that pt has sitter as he left floor yesterday. Anticipate d/c tomorrow per MD.   Benay Pike, [place]

## 2015-04-08 NOTE — NC FL2 (Signed)
[place] LEVEL OF CARE SCREENING TOOL     IDENTIFICATION  Patient Name: Scott [name] Birthdate: 02-11-1961 Sex: male Admission Date (Current Location): 04/05/2015  [facility] and [place] Number:      Facility and Address:  [facility] [address], [place]      Provider Number: [id number]  Attending Physician Name and Address:  Rosita Fire, MD  Relative Name and Phone Number:       Current Level of Care: Hospital Recommended Level of Care: [place] Prior Approval Number:    Date Approved/Denied:   PASRR Number: [id number]  Discharge Plan: SNF    Current Diagnoses: Patient Active Problem List   Diagnosis Date Noted  . Left leg cellulitis 04/05/2015  . Mental retardation 04/05/2015  . Cellulitis 04/05/2015  . History of colonic polyps   . Diverticulosis of colon without hemorrhage   . Polypharmacy 01/27/2015  . Encounter for screening colonoscopy 01/27/2015  . Fatty liver 01/27/2015  . Gallbladder polyp 01/27/2015    Orientation RESPIRATION BLADDER Height & Weight    Self, Place, Time  Normal Continent 6\' 2"  (188 cm) 205 lbs.  BEHAVIORAL SYMPTOMS/MOOD NEUROLOGICAL BOWEL NUTRITION STATUS  Other (Comment) (n/a)  (n/a) Continent Diet (Heart healthy)  AMBULATORY STATUS COMMUNICATION OF NEEDS Skin   Limited Assist Verbally Other (Comment) (Left leg/foot cellulitis)                       Personal Care Assistance Level of Assistance  Bathing, Feeding, Dressing Bathing Assistance: Limited assistance Feeding assistance: Limited assistance Dressing Assistance: Limited assistance     Functional Limitations Info  Sight, Hearing, Speech Sight Info: Impaired Hearing Info: Adequate Speech Info: Adequate    SPECIAL CARE FACTORS FREQUENCY                       Contractures Contractures Info: Not present    Additional Factors Info  Psychotropic, Insulin Sliding Scale Code Status Info:  Full code Allergies Info: No known allergies Psychotropic Info: Depakote, Risperdal, Trazodone.  Insulin Sliding Scale Info: 4x daily Isolation Precautions Info: 05/09/10 MRSA. 04/05/15 Patient has MRSA in wound from office culture per Dr. Arvil Persons note.     Current Medications (04/08/2015):  This is the current hospital active medication list Current Facility-Administered Medications  Medication Dose Route Frequency Provider Last Rate Last Dose  . acetaminophen (TYLENOL) tablet 500 mg  500 mg Oral Daily Orvan Falconer, MD   500 mg at 04/08/15 0820  . albuterol (PROVENTIL) (2.5 MG/3ML) 0.083% nebulizer solution 3 mL  3 mL Inhalation Q4H PRN Orvan Falconer, MD   3 mL at 04/06/15 [id number]  . atorvastatin (LIPITOR) tablet 10 mg  10 mg Oral Daily Orvan Falconer, MD   10 mg at 04/08/15 [id number]  . divalproex (DEPAKOTE) DR tablet 500 mg  500 mg Oral Q12H Orvan Falconer, MD   500 mg at 04/08/15 0820  . enoxaparin (LOVENOX) injection 50 mg  50 mg Subcutaneous Q24H Orvan Falconer, MD   50 mg at 04/07/15 2223  . HYDROcodone-acetaminophen (NORCO/VICODIN) 5-325 MG per tablet 1 tablet  1 tablet Oral Q6H PRN Orvan Falconer, MD   1 tablet at 04/07/15 2246  . insulin aspart (novoLOG) injection 0-5 Units  0-5 Units Subcutaneous QHS Orvan Falconer, MD   0 Units at 04/05/15 2210  . insulin aspart (novoLOG) injection 0-9 Units  0-9 Units Subcutaneous TID WC Orvan Falconer, MD   1 Units  at 04/07/15 1741  . linagliptin (TRADJENTA) tablet 5 mg  5 mg Oral Daily Orvan Falconer, MD   5 mg at 04/08/15 0820  . lisinopril (PRINIVIL,ZESTRIL) tablet 5 mg  5 mg Oral Daily Orvan Falconer, MD   5 mg at 04/08/15 0820  . metFORMIN (GLUCOPHAGE-XR) 24 hr tablet 1,000 mg  1,000 mg Oral BID WC Rosita Fire, MD   1,000 mg at 04/08/15 0820  . ondansetron (ZOFRAN) tablet 4 mg  4 mg Oral Q6H PRN Orvan Falconer, MD       Or  . ondansetron [facility]) injection 4 mg  4 mg Intravenous Q6H PRN Orvan Falconer, MD   4 mg at 04/07/15 1534  . risperiDONE (RISPERDAL) tablet 1 mg  1 mg Oral BID Rosita Fire, MD   1 mg at  04/08/15 0820  . tiotropium (SPIRIVA) inhalation capsule 18 mcg  18 mcg Inhalation Daily Orvan Falconer, MD   18 mcg at 04/07/15 0800  . traZODone (DESYREL) tablet 50 mg  50 mg Oral QHS Orvan Falconer, MD   50 mg at 04/07/15 2223  . vancomycin (VANCOCIN) IVPB 1000 mg/200 mL premix  1,000 mg Intravenous Q12H Rosita Fire, MD   1,000 mg at 04/08/15 [id number]     Discharge Medications: Please see discharge summary for a list of discharge medications.  Relevant Imaging Results:  Relevant Lab Results:   Additional Information Will have PICC line for about 10 days antibiotics. Pt has [place] guardian- [name] [phone], [phone])  [name], [place]

## 2015-04-08 NOTE — Care Management Important Message (Signed)
Important Message  Patient Details  Name: Scott [name] MRN: [id number] Date of Birth: [birth year]   Medicare Important Message Given:  Yes    Joylene Draft, RN 04/08/2015, 11:09 AM

## 2015-04-08 NOTE — Care Management Note (Signed)
Case Management Note  Patient Details  Name: Scott [name] MRN: [id number] Date of Birth: 02-06-1961  Subjective/Objective:                    Action/Plan:   Expected Discharge Date:                  Expected Discharge Plan:  Skilled Nursing Facility  In-House Referral:  Clinical Social Work  Discharge planning Services  CM Consult  Post Acute Care Choice:  NA Choice offered to:  NA  DME Arranged:    DME Agency:     HH Arranged:    [place] Agency:     Status of Service:  Completed, signed off  Medicare Important Message Given:  Yes Date Medicare IM Given:    Medicare IM give by:    Date Additional Medicare IM Given:    Additional Medicare Important Message give by:     If discussed at [place] of Stay Meetings, dates discussed:    Additional Comments: Pt will need IV AB at discharge. PICC line placed today. Telepsych eval scheduled today. CSW looking for SNF bed for IV AB. Anticipate discharge over the weekend. [name], RN 04/08/2015, 11:09 AM
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
## 2015-04-08 NOTE — Clinical Social Work Placement (Signed)
   CLINICAL SOCIAL WORK PLACEMENT  NOTE  Date:  04/08/2015  Patient Details  Name: Scott [name] MRN: [id number] Date of Birth: [birth year]  Clinical Social Work is seeking post-discharge placement for this patient at the [place] level of care (*CSW will initial, date and re-position this form in  chart as items are completed):  Yes   Patient/family provided with [facility] Work Department's list of facilities offering this level of care within the geographic area requested by the patient (or if unable, by the patient's family).  Yes   Patient/family informed of their freedom to choose among providers that offer the needed level of care, that participate in Medicare, Medicaid or managed care program needed by the patient, have an available bed and are willing to accept the patient.  Yes   Patient/family informed of [facility]'s ownership interest in [facility] and [facility], as well as of the fact that they are under no obligation to receive care at these facilities.  PASRR submitted to EDS on       PASRR number received on       Existing PASRR number confirmed on 04/08/15     FL2 transmitted to all facilities in geographic area requested by pt/family on 04/08/15     FL2 transmitted to all facilities within larger geographic area on       Patient informed that his/her managed care company has contracts with or will negotiate with certain facilities, including the following:        Yes   Patient/family informed of bed offers received.  Patient chooses bed at [facility]     Physician recommends and patient chooses bed at      Patient to be transferred to [facility] on  .  Patient to be transferred to facility by       Patient family notified on   of transfer.  Name of family member notified:        PHYSICIAN       Additional Comment:    _______________________________________________ [name], LCSW 04/08/2015, 12:18 PM [phone]
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
## 2015-04-09 DIAGNOSIS — I10 Essential (primary) hypertension: Secondary | ICD-10-CM | POA: Diagnosis not present

## 2015-04-09 DIAGNOSIS — E1142 Type 2 diabetes mellitus with diabetic polyneuropathy: Secondary | ICD-10-CM | POA: Diagnosis not present

## 2015-04-09 DIAGNOSIS — E1165 Type 2 diabetes mellitus with hyperglycemia: Secondary | ICD-10-CM | POA: Diagnosis not present

## 2015-04-09 DIAGNOSIS — L02419 Cutaneous abscess of limb, unspecified: Secondary | ICD-10-CM | POA: Diagnosis not present

## 2015-04-09 DIAGNOSIS — E08621 Diabetes mellitus due to underlying condition with foot ulcer: Secondary | ICD-10-CM | POA: Diagnosis not present

## 2015-04-09 DIAGNOSIS — L03116 Cellulitis of left lower limb: Secondary | ICD-10-CM | POA: Diagnosis not present

## 2015-04-09 LAB — GLUCOSE, CAPILLARY
GLUCOSE-CAPILLARY: 118 mg/dL — AB (ref 65–99)
Glucose-Capillary: 96 mg/dL (ref 65–99)

## 2015-04-09 MED ORDER — HYDROCODONE-ACETAMINOPHEN 5-325 MG PO TABS: 1.0000 | ORAL_TABLET | Freq: Four times a day (QID) | ORAL | Status: DC | PRN

## 2015-04-09 MED ORDER — VANCOMYCIN HCL IN DEXTROSE 1-5 GM/200ML-% IV SOLN: 1000.0000 mg | Freq: Two times a day (BID) | INTRAVENOUS | Status: DC

## 2015-04-09 NOTE — Progress Notes (Signed)
1400 Report called to Jeani Hawking, Librarian, academic at [facility].  Patient to discharge to room 157 today.

## 2015-04-09 NOTE — Discharge Summary (Signed)
Physician Discharge Summary  Patient ID: JAHOD RADDE MRN: [id number] DOB/AGE: October 15, 1960 54 y.o. Primary Care Physician:Rebekha Diveley, MD Admit date: 04/05/2015 Discharge date: 04/09/2015    Discharge Diagnoses:     Principal Problem:   Bipolar I disorder, most recent episode depressed ([place]) Active Problems:   Fatty liver   Left leg cellulitis   Mental retardation   Cellulitis     Medication List    STOP taking these medications        cephALEXin 500 MG capsule  Commonly known as:  KEFLEX     sulfamethoxazole-trimethoprim 800-160 MG tablet  Commonly known as:  BACTRIM DS,SEPTRA DS      TAKE these medications        acetaminophen 500 MG tablet  Commonly known as:  TYLENOL  Take 500 mg by mouth daily.     albuterol 108 (90 Base) MCG/ACT inhaler  Commonly known as:  PROVENTIL HFA;VENTOLIN HFA  Inhale 1 puff into the lungs every 4 (four) hours as needed. Shortness of breath and wheezing     atorvastatin 10 MG tablet  Commonly known as:  LIPITOR  Take 10 mg by mouth daily.     divalproex 500 MG DR tablet  Commonly known as:  DEPAKOTE  Take 500 mg by mouth 2 (two) times daily. Given every 12 hrs at 0800 hrs and 2000 hrs     HYDROcodone-acetaminophen 5-325 MG tablet  Commonly known as:  NORCO/VICODIN  Take 1 tablet by mouth every 6 (six) hours as needed for moderate pain.     lisinopril 5 MG tablet  Commonly known as:  PRINIVIL,ZESTRIL  Take 5 mg by mouth daily.     metFORMIN 1000 MG (MOD) 24 hr tablet  Commonly known as:  GLUMETZA  Take 1,000 mg by mouth 2 (two) times daily with a meal.     ondansetron 4 MG tablet  Commonly known as:  ZOFRAN  Take 4 mg by mouth every 6 (six) hours as needed for nausea or vomiting.     risperiDONE 1 MG tablet  Commonly known as:  RISPERDAL  Take 1 mg by mouth 2 (two) times daily.     tiotropium 18 MCG inhalation capsule  Commonly known as:  SPIRIVA  Place 18 mcg into inhaler and inhale daily.     TRADJENTA 5 MG  Tabs tablet  Generic drug:  linagliptin  Take 5 mg by mouth daily.     traMADol 50 MG tablet  Commonly known as:  ULTRAM  Take 50 mg by mouth 2 (two) times daily.     traZODone 50 MG tablet  Commonly known as:  DESYREL  Take 50 mg by mouth at bedtime.     vancomycin 1 GM/200ML Soln  Commonly known as:  VANCOCIN  Inject 200 mLs (1,000 mg total) into the vein every 12 (twelve) hours.        Discharged Condition: improving    Consults: psychiatry  Significant Diagnostic Studies: Dg Chest 2 View  03/24/2015  CLINICAL DATA:  Cough, fever. EXAM: CHEST  2 VIEW COMPARISON:  November 26, 2011. FINDINGS: The heart size and mediastinal contours are within normal limits. Both lungs are clear. No pneumothorax or pleural effusion is noted. The visualized skeletal structures are unremarkable. IMPRESSION: No active cardiopulmonary disease. Electronically Signed   By: Marijo Conception, M.D.   On: 03/24/2015 14:19   US Venous Img Lower Unilateral Left  03/29/2015  CLINICAL DATA:  Left lower extremity pain and swelling for the past  2 weeks. Evaluate for DVT. EXAM: LEFT LOWER EXTREMITY VENOUS DOPPLER ULTRASOUND TECHNIQUE: Gray-scale sonography with graded compression, as well as color Doppler and duplex ultrasound were performed to evaluate the lower extremity deep venous systems from the level of the common femoral vein and including the common femoral, femoral, profunda femoral, popliteal and calf veins including the posterior tibial, peroneal and gastrocnemius veins when visible. The superficial great saphenous vein was also interrogated. Spectral Doppler was utilized to evaluate flow at rest and with distal augmentation maneuvers in the common femoral, femoral and popliteal veins. COMPARISON:  None. FINDINGS: Contralateral Common Femoral Vein: Respiratory phasicity is normal and symmetric with the symptomatic side. No evidence of thrombus. Normal compressibility. Common Femoral Vein: No evidence of  thrombus. Normal compressibility, respiratory phasicity and response to augmentation. Saphenofemoral Junction: No evidence of thrombus. Normal compressibility and flow on color Doppler imaging. Profunda Femoral Vein: No evidence of thrombus. Normal compressibility and flow on color Doppler imaging. Femoral Vein: No evidence of thrombus. Normal compressibility, respiratory phasicity and response to augmentation. Popliteal Vein: No evidence of thrombus. Normal compressibility, respiratory phasicity and response to augmentation. Calf Veins: No evidence of thrombus. Normal compressibility and flow on color Doppler imaging. Superficial Great Saphenous Vein: No evidence of thrombus. Normal compressibility and flow on color Doppler imaging. Venous Reflux:  None. Other Findings:  None. IMPRESSION: No evidence of DVT within the left lower extremity. Electronically Signed   By: Sandi Mariscal M.D.   On: 03/29/2015 16:32    Lab Results: Basic Metabolic Panel: No results for input(s): NA, K, CL, CO2, GLUCOSE, BUN, CREATININE, CALCIUM, MG, PHOS in the last 72 hours. Liver Function Tests: No results for input(s): AST, ALT, ALKPHOS, BILITOT, PROT, ALBUMIN in the last 72 hours.   CBC: No results for input(s): WBC, NEUTROABS, HGB, HCT, MCV, PLT in the last 72 hours.  Recent Results (from the past 240 hour(s))  MRSA PCR Screening     Status: None   Collection Time: 04/05/15  6:30 PM  Result Value Ref Range Status   MRSA by PCR NEGATIVE NEGATIVE Final    Comment:        The GeneXpert MRSA Assay (FDA approved for NASAL specimens only), is one component of a comprehensive MRSA colonization surveillance program. It is not intended to diagnose MRSA infection nor to guide or monitor treatment for MRSA infections.      Hospital Course:   This is a 54 years old male with history of multiple medical illnesses was admitted with extensive left leg cellulitis. Patient is gradually improving. He needs 10 days course  IV Vancomycin. He is being transfurred to continue his antibiotic and regular treatment.  Discharge Exam: Blood pressure 122/87, pulse 100, temperature 98 F (36.7 C), temperature source Oral, resp. rate 18, height 6\' 2"  (1.88 m), weight 93.123 kg (205 lb 4.8 oz), SpO2 97 %.    Disposition:  Skilled nursing home.      Signed: Geonna Lockyer   04/09/2015, 9:57 AM

## 2015-04-10 ENCOUNTER — Inpatient Hospital Stay
Admission: RE | Admit: 2015-04-10 | Discharge: 2015-04-21 | Disposition: A | Discharge status: Skilled Nursing Facility | Payer: Medicare Other | Source: Ambulatory Visit | Attending: Internal Medicine | Admitting: Internal Medicine

## 2015-04-10 ENCOUNTER — Emergency Department (HOSPITAL_COMMUNITY)
Admission: EM | Admit: 2015-04-10 | Discharge: 2015-04-10 | Disposition: A | Discharge status: Home / Self Care | Payer: Medicare Other | Attending: Emergency Medicine | Admitting: Emergency Medicine

## 2015-04-10 ENCOUNTER — Encounter (HOSPITAL_COMMUNITY): Payer: Self-pay | Admitting: *Deleted

## 2015-04-10 DIAGNOSIS — L03116 Cellulitis of left lower limb: Secondary | ICD-10-CM | POA: Diagnosis not present

## 2015-04-10 DIAGNOSIS — Z8701 Personal history of pneumonia (recurrent): Secondary | ICD-10-CM | POA: Insufficient documentation

## 2015-04-10 DIAGNOSIS — F319 Bipolar disorder, unspecified: Secondary | ICD-10-CM | POA: Insufficient documentation

## 2015-04-10 DIAGNOSIS — Z87828 Personal history of other (healed) physical injury and trauma: Secondary | ICD-10-CM | POA: Insufficient documentation

## 2015-04-10 DIAGNOSIS — E119 Type 2 diabetes mellitus without complications: Secondary | ICD-10-CM | POA: Insufficient documentation

## 2015-04-10 DIAGNOSIS — Z79899 Other long term (current) drug therapy: Secondary | ICD-10-CM | POA: Diagnosis not present

## 2015-04-10 DIAGNOSIS — M1990 Unspecified osteoarthritis, unspecified site: Secondary | ICD-10-CM | POA: Insufficient documentation

## 2015-04-10 DIAGNOSIS — F17210 Nicotine dependence, cigarettes, uncomplicated: Secondary | ICD-10-CM | POA: Diagnosis not present

## 2015-04-10 DIAGNOSIS — F79 Unspecified intellectual disabilities: Secondary | ICD-10-CM | POA: Insufficient documentation

## 2015-04-10 DIAGNOSIS — M79601 Pain in right arm: Principal | ICD-10-CM

## 2015-04-10 DIAGNOSIS — Z008 Encounter for other general examination: Secondary | ICD-10-CM | POA: Diagnosis present

## 2015-04-10 DIAGNOSIS — Z7984 Long term (current) use of oral hypoglycemic drugs: Secondary | ICD-10-CM | POA: Insufficient documentation

## 2015-04-10 NOTE — ED Notes (Signed)
Pt. Escorted to [facility] in care of sitter.

## 2015-04-10 NOTE — Discharge Instructions (Signed)
We performed a psychiatric evaluation via behavioral health. It was their opinion that he did not meet any criteria for a psychiatric admission.

## 2015-04-10 NOTE — ED Notes (Signed)
Pt sent over for psych eval from [facility]. States pt. Is wandering around looking for clothes. [facility] for cellulits. Pt. Alert and oriented presently. States he did not get his clothes when he left and wants to find them.

## 2015-04-10 NOTE — Progress Notes (Signed)
Patient's PICC line flushed prior to discharge.  Site WNL.  Patient discharged to [facility] via wheelchair escorted by NT.  Packet and belongings sent with patient.
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
## 2015-04-10 NOTE — ED Notes (Signed)
Discharge called to [facility]. Sitter to walk pt. Back to [facility], to his new room 107.
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
## 2015-04-10 NOTE — ED Notes (Signed)
AC called to give update. Pt. Is difficult for staff at [facility], due to taking off his Wander Guard and wandering around without staff knowledge. AC is calling to get a sitter for patient if he goes back to [facility]. Director at [facility] is calling to see if patient's meds can be changed to PO meds and be sent back to [facility] where he is originally from.

## 2015-04-10 NOTE — ED Notes (Signed)
Pt. Standing at door way, easily directed back to room.

## 2015-04-10 NOTE — ED Notes (Signed)
Pt to be discharged back to [facility] after eating his lunch.

## 2015-04-10 NOTE — ED Notes (Signed)
MD Cook at bedside. 

## 2015-04-10 NOTE — ED Notes (Signed)
Safety sitter from [facility] arrived and is at bedside.

## 2015-04-10 NOTE — ED Notes (Signed)
[facility] RN called and states pt. Took off his Building surveyor and left facility without staff knowledge. Also states pulled out his PICC line. Pt. States PICC line came out in his sleep.

## 2015-04-10 NOTE — ED Notes (Signed)
Consult in progress.

## 2015-04-10 NOTE — ED Notes (Signed)
Nurse called from [facility] center, states they will not accept Scott [name] back. EDP notified. AC called.

## 2015-04-10 NOTE — ED Notes (Signed)
EDP updated to patient status.

## 2015-04-11 ENCOUNTER — Encounter (HOSPITAL_COMMUNITY)
Admission: AD | Admit: 2015-04-11 | Discharge: 2015-04-11 | Disposition: A | Discharge status: Home / Self Care | Payer: Medicare Other | Source: Skilled Nursing Facility | Attending: Internal Medicine | Admitting: Internal Medicine

## 2015-04-11 ENCOUNTER — Non-Acute Institutional Stay (SKILLED_NURSING_FACILITY): Payer: Medicare Other | Admitting: Internal Medicine

## 2015-04-11 DIAGNOSIS — E08621 Diabetes mellitus due to underlying condition with foot ulcer: Secondary | ICD-10-CM

## 2015-04-11 DIAGNOSIS — L02419 Cutaneous abscess of limb, unspecified: Secondary | ICD-10-CM | POA: Diagnosis not present

## 2015-04-11 DIAGNOSIS — E1142 Type 2 diabetes mellitus with diabetic polyneuropathy: Secondary | ICD-10-CM

## 2015-04-11 DIAGNOSIS — Z79899 Other long term (current) drug therapy: Secondary | ICD-10-CM | POA: Insufficient documentation

## 2015-04-11 DIAGNOSIS — L97529 Non-pressure chronic ulcer of other part of left foot with unspecified severity: Secondary | ICD-10-CM | POA: Diagnosis not present

## 2015-04-11 DIAGNOSIS — I10 Essential (primary) hypertension: Secondary | ICD-10-CM | POA: Insufficient documentation

## 2015-04-11 DIAGNOSIS — L03119 Cellulitis of unspecified part of limb: Secondary | ICD-10-CM | POA: Diagnosis not present

## 2015-04-11 DIAGNOSIS — F319 Bipolar disorder, unspecified: Secondary | ICD-10-CM | POA: Diagnosis not present

## 2015-04-11 DIAGNOSIS — F79 Unspecified intellectual disabilities: Secondary | ICD-10-CM | POA: Diagnosis not present

## 2015-04-11 DIAGNOSIS — L0390 Cellulitis, unspecified: Secondary | ICD-10-CM | POA: Insufficient documentation

## 2015-04-11 LAB — CREATININE, SERUM
Creatinine, Ser: 1.02 mg/dL (ref 0.61–1.24)
GFR calc Af Amer: >60 mL/min (ref >60)
GFR calc non Af Amer: >60 mL/min (ref >60)

## 2015-04-11 LAB — BUN: BUN: 20 mg/dL (ref 6–20)

## 2015-04-11 LAB — VANCOMYCIN, TROUGH: Vancomycin Tr: 10 ug/mL (ref 10.0–20.0)
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
## 2015-04-13 ENCOUNTER — Encounter (HOSPITAL_COMMUNITY)
Admission: AD | Admit: 2015-04-13 | Discharge: 2015-04-13 | Disposition: A | Discharge status: Home / Self Care | Payer: Medicare Other | Source: Skilled Nursing Facility | Attending: Internal Medicine | Admitting: Internal Medicine

## 2015-04-13 ENCOUNTER — Non-Acute Institutional Stay (SKILLED_NURSING_FACILITY): Payer: Medicare Other | Admitting: Internal Medicine

## 2015-04-13 DIAGNOSIS — L02419 Cutaneous abscess of limb, unspecified: Secondary | ICD-10-CM

## 2015-04-13 DIAGNOSIS — E08621 Diabetes mellitus due to underlying condition with foot ulcer: Secondary | ICD-10-CM | POA: Diagnosis not present

## 2015-04-13 DIAGNOSIS — Z79899 Other long term (current) drug therapy: Secondary | ICD-10-CM | POA: Diagnosis present

## 2015-04-13 DIAGNOSIS — L97521 Non-pressure chronic ulcer of other part of left foot limited to breakdown of skin: Secondary | ICD-10-CM

## 2015-04-13 DIAGNOSIS — I10 Essential (primary) hypertension: Secondary | ICD-10-CM | POA: Diagnosis present

## 2015-04-13 DIAGNOSIS — F319 Bipolar disorder, unspecified: Secondary | ICD-10-CM | POA: Diagnosis not present

## 2015-04-13 DIAGNOSIS — L03119 Cellulitis of unspecified part of limb: Secondary | ICD-10-CM

## 2015-04-13 DIAGNOSIS — L97529 Non-pressure chronic ulcer of other part of left foot with unspecified severity: Secondary | ICD-10-CM

## 2015-04-13 DIAGNOSIS — L0390 Cellulitis, unspecified: Secondary | ICD-10-CM | POA: Diagnosis present

## 2015-04-13 LAB — CBC WITH DIFFERENTIAL/PLATELET
BASOS ABS: 0 10*3/uL (ref 0.0–0.1)
Basophils Relative: 0 %
Eosinophils Absolute: 0.1 10*3/uL (ref 0.0–0.7)
Eosinophils Relative: 1 %
HEMATOCRIT: 35.6 % — AB (ref 39.0–52.0)
Hemoglobin: 11.5 g/dL — ABNORMAL LOW (ref 13.0–17.0)
LYMPHS PCT: 25 %
Lymphs Abs: 2.2 10*3/uL (ref 0.7–4.0)
MCH: 30.7 pg (ref 26.0–34.0)
MCHC: 32.3 g/dL (ref 30.0–36.0)
MCV: 95.2 fL (ref 78.0–100.0)
Monocytes Absolute: 1 10*3/uL (ref 0.1–1.0)
Monocytes Relative: 11 %
NEUTROS ABS: 5.6 10*3/uL (ref 1.7–7.7)
Neutrophils Relative %: 63 %
PLATELETS: 351 10*3/uL (ref 150–400)
RBC: 3.74 MIL/uL — AB (ref 4.22–5.81)
RDW: 14 % (ref 11.5–15.5)
WBC: 8.9 10*3/uL (ref 4.0–10.5)

## 2015-04-13 LAB — TSH: TSH: 1.712 u[IU]/mL (ref 0.350–4.500)

## 2015-04-13 LAB — BASIC METABOLIC PANEL
ANION GAP: 8 (ref 5–15)
BUN: 19 mg/dL (ref 6–20)
CO2: 28 mmol/L (ref 22–32)
Calcium: 9.2 mg/dL (ref 8.9–10.3)
Chloride: 104 mmol/L (ref 101–111)
Creatinine, Ser: 0.96 mg/dL (ref 0.61–1.24)
GFR calc Af Amer: >60 mL/min (ref >60)
Glucose, Bld: 136 mg/dL — ABNORMAL HIGH (ref 65–99)
POTASSIUM: 4.2 mmol/L (ref 3.5–5.1)
SODIUM: 140 mmol/L (ref 135–145)

## 2015-04-13 LAB — VALPROIC ACID LEVEL: VALPROIC ACID LVL: 29 ug/mL — AB (ref 50.0–100.0)

## 2015-04-13 LAB — CREATININE, SERUM
Creatinine, Ser: 0.97 mg/dL (ref 0.61–1.24)
GFR calc Af Amer: >60 mL/min (ref >60)

## 2015-04-13 LAB — VANCOMYCIN, TROUGH: Vancomycin Tr: 12 ug/mL (ref 10.0–20.0)

## 2015-04-14 LAB — HEMOGLOBIN A1C
Hgb A1c MFr Bld: 6.5 % — ABNORMAL HIGH (ref 4.8–5.6)
MEAN PLASMA GLUCOSE: 140 mg/dL
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
## 2015-04-15 ENCOUNTER — Other Ambulatory Visit (HOSPITAL_COMMUNITY)
Admission: RE | Admit: 2015-04-15 | Discharge: 2015-04-15 | Disposition: A | Discharge status: Home / Self Care | Payer: Medicare Other | Source: Skilled Nursing Facility | Attending: Internal Medicine | Admitting: Internal Medicine

## 2015-04-15 DIAGNOSIS — L97529 Non-pressure chronic ulcer of other part of left foot with unspecified severity: Secondary | ICD-10-CM | POA: Insufficient documentation

## 2015-04-15 DIAGNOSIS — L02419 Cutaneous abscess of limb, unspecified: Secondary | ICD-10-CM | POA: Insufficient documentation

## 2015-04-15 LAB — BASIC METABOLIC PANEL
Anion gap: 8 (ref 5–15)
BUN: 17 mg/dL (ref 6–20)
CHLORIDE: 101 mmol/L (ref 101–111)
CO2: 27 mmol/L (ref 22–32)
CREATININE: 0.94 mg/dL (ref 0.61–1.24)
Calcium: 8.7 mg/dL — ABNORMAL LOW (ref 8.9–10.3)
GFR calc Af Amer: >60 mL/min (ref >60)
Glucose, Bld: 217 mg/dL — ABNORMAL HIGH (ref 65–99)
Potassium: 4.5 mmol/L (ref 3.5–5.1)
Sodium: 136 mmol/L (ref 135–145)

## 2015-04-15 LAB — VANCOMYCIN, TROUGH: VANCOMYCIN TR: 12 ug/mL (ref 10.0–20.0)

## 2015-04-17 ENCOUNTER — Inpatient Hospital Stay (HOSPITAL_COMMUNITY): Payer: Medicare Other | Attending: Internal Medicine

## 2015-04-18 ENCOUNTER — Other Ambulatory Visit: Payer: Self-pay | Admitting: *Deleted

## 2015-04-18 MED ORDER — HYDROCODONE-ACETAMINOPHEN 5-325 MG PO TABS: 1.0000 | ORAL_TABLET | Freq: Four times a day (QID) | ORAL | Status: DC | PRN

## 2015-04-18 NOTE — Telephone Encounter (Signed)
[facility] 

## 2015-04-19 ENCOUNTER — Non-Acute Institutional Stay (SKILLED_NURSING_FACILITY): Payer: Medicare Other | Admitting: Internal Medicine

## 2015-04-19 ENCOUNTER — Encounter: Payer: Self-pay | Admitting: Internal Medicine

## 2015-04-19 DIAGNOSIS — F3130 Bipolar disorder, current episode depressed, mild or moderate severity, unspecified: Secondary | ICD-10-CM

## 2015-04-19 DIAGNOSIS — F79 Unspecified intellectual disabilities: Secondary | ICD-10-CM

## 2015-04-19 DIAGNOSIS — L03116 Cellulitis of left lower limb: Secondary | ICD-10-CM | POA: Diagnosis not present

## 2015-04-19 DIAGNOSIS — I10 Essential (primary) hypertension: Secondary | ICD-10-CM
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
## 2015-04-20 ENCOUNTER — Encounter (HOSPITAL_COMMUNITY)
Admission: AD | Admit: 2015-04-20 | Discharge: 2015-04-20 | Disposition: A | Discharge status: Home / Self Care | Payer: Medicare Other | Source: Skilled Nursing Facility | Attending: Internal Medicine | Admitting: Internal Medicine

## 2015-04-20 LAB — CBC WITH DIFFERENTIAL/PLATELET
BASOS PCT: 0 %
Basophils Absolute: 0 10*3/uL (ref 0.0–0.1)
Eosinophils Absolute: 0.2 10*3/uL (ref 0.0–0.7)
Eosinophils Relative: 2 %
HCT: 36.5 % — ABNORMAL LOW (ref 39.0–52.0)
HEMOGLOBIN: 11.9 g/dL — AB (ref 13.0–17.0)
Lymphocytes Relative: 26 %
Lymphs Abs: 2.5 10*3/uL (ref 0.7–4.0)
MCH: 31 pg (ref 26.0–34.0)
MCHC: 32.6 g/dL (ref 30.0–36.0)
MCV: 95.1 fL (ref 78.0–100.0)
MONO ABS: 0.9 10*3/uL (ref 0.1–1.0)
Monocytes Relative: 9 %
NEUTROS ABS: 6.2 10*3/uL (ref 1.7–7.7)
NEUTROS PCT: 63 %
Platelets: 309 10*3/uL (ref 150–400)
RBC: 3.84 MIL/uL — AB (ref 4.22–5.81)
RDW: 14.2 % (ref 11.5–15.5)
WBC: 9.8 10*3/uL (ref 4.0–10.5)

## 2015-04-20 LAB — BASIC METABOLIC PANEL
ANION GAP: 10 (ref 5–15)
BUN: 25 mg/dL — ABNORMAL HIGH (ref 6–20)
CALCIUM: 9.3 mg/dL (ref 8.9–10.3)
CO2: 29 mmol/L (ref 22–32)
Chloride: 103 mmol/L (ref 101–111)
Creatinine, Ser: 1.01 mg/dL (ref 0.61–1.24)
GLUCOSE: 144 mg/dL — AB (ref 65–99)
Potassium: 4.5 mmol/L (ref 3.5–5.1)
Sodium: 142 mmol/L (ref 135–145)

## 2015-04-26 ENCOUNTER — Encounter: Payer: Self-pay | Admitting: Emergency Medicine

## 2015-04-26 ENCOUNTER — Emergency Department
Admission: EM | Admit: 2015-04-26 | Discharge: 2015-04-27 | Disposition: A | Discharge status: Other Acute Inpatient Hospital | Payer: Medicare Other | Attending: Emergency Medicine | Admitting: Emergency Medicine

## 2015-04-26 ENCOUNTER — Emergency Department (HOSPITAL_COMMUNITY)
Admission: EM | Admit: 2015-04-26 | Discharge: 2015-04-26 | Disposition: A | Discharge status: Home / Self Care | Payer: Medicare Other | Attending: Emergency Medicine | Admitting: Emergency Medicine

## 2015-04-26 DIAGNOSIS — Z008 Encounter for other general examination: Secondary | ICD-10-CM | POA: Diagnosis present

## 2015-04-26 DIAGNOSIS — Z7984 Long term (current) use of oral hypoglycemic drugs: Secondary | ICD-10-CM | POA: Diagnosis not present

## 2015-04-26 DIAGNOSIS — Z79899 Other long term (current) drug therapy: Secondary | ICD-10-CM | POA: Diagnosis not present

## 2015-04-26 DIAGNOSIS — L03116 Cellulitis of left lower limb: Secondary | ICD-10-CM | POA: Diagnosis not present

## 2015-04-26 DIAGNOSIS — E119 Type 2 diabetes mellitus without complications: Secondary | ICD-10-CM

## 2015-04-26 DIAGNOSIS — F319 Bipolar disorder, unspecified: Secondary | ICD-10-CM | POA: Insufficient documentation

## 2015-04-26 DIAGNOSIS — F17210 Nicotine dependence, cigarettes, uncomplicated: Secondary | ICD-10-CM | POA: Diagnosis not present

## 2015-04-26 DIAGNOSIS — F3160 Bipolar disorder, current episode mixed, unspecified: Secondary | ICD-10-CM | POA: Diagnosis not present

## 2015-04-26 LAB — CBC
HEMATOCRIT: 34.3 % — AB (ref 40.0–52.0)
Hemoglobin: 11.4 g/dL — ABNORMAL LOW (ref 13.0–18.0)
MCH: 30.1 pg (ref 26.0–34.0)
MCHC: 33.1 g/dL (ref 32.0–36.0)
MCV: 91.1 fL (ref 80.0–100.0)
PLATELETS: 228 10*3/uL (ref 150–440)
RBC: 3.77 MIL/uL — AB (ref 4.40–5.90)
RDW: 14.8 % — ABNORMAL HIGH (ref 11.5–14.5)
WBC: 8.1 10*3/uL (ref 3.8–10.6)

## 2015-04-26 LAB — COMPREHENSIVE METABOLIC PANEL
ALT: 14 U/L — ABNORMAL LOW (ref 17–63)
ANION GAP: 9 (ref 5–15)
AST: 15 U/L (ref 15–41)
Albumin: 3.2 g/dL — ABNORMAL LOW (ref 3.5–5.0)
Alkaline Phosphatase: 58 U/L (ref 38–126)
BUN: 14 mg/dL (ref 6–20)
CHLORIDE: 105 mmol/L (ref 101–111)
CO2: 24 mmol/L (ref 22–32)
Calcium: 8.9 mg/dL (ref 8.9–10.3)
Creatinine, Ser: 0.95 mg/dL (ref 0.61–1.24)
Glucose, Bld: 133 mg/dL — ABNORMAL HIGH (ref 65–99)
Potassium: 4.3 mmol/L (ref 3.5–5.1)
SODIUM: 138 mmol/L (ref 135–145)
Total Bilirubin: 0.5 mg/dL (ref 0.3–1.2)
Total Protein: 7 g/dL (ref 6.5–8.1)

## 2015-04-26 LAB — VALPROIC ACID LEVEL: VALPROIC ACID LVL: 56 ug/mL (ref 50.0–100.0)

## 2015-04-26 LAB — URINE DRUG SCREEN, QUALITATIVE (ARMC ONLY)
AMPHETAMINES, UR SCREEN: NOT DETECTED
BENZODIAZEPINE, UR SCRN: NOT DETECTED
Barbiturates, Ur Screen: NOT DETECTED
Cannabinoid 50 Ng, Ur ~~LOC~~: NOT DETECTED
Cocaine Metabolite,Ur ~~LOC~~: NOT DETECTED
MDMA (ECSTASY) UR SCREEN: NOT DETECTED
METHADONE SCREEN, URINE: NOT DETECTED
OPIATE, UR SCREEN: NOT DETECTED
PHENCYCLIDINE (PCP) UR S: NOT DETECTED
Tricyclic, Ur Screen: NOT DETECTED

## 2015-04-26 LAB — ETHANOL: ALCOHOL ETHYL (B): 11 mg/dL — AB (ref <5)

## 2015-04-26 MED ORDER — CLINDAMYCIN HCL 300 MG PO CAPS: 300.0000 mg | ORAL_CAPSULE | Freq: Three times a day (TID) | ORAL | Status: DC

## 2015-04-26 NOTE — ED Notes (Signed)
Called name for triage.  Unable to find pt.

## 2015-04-26 NOTE — ED Notes (Signed)
Pt reminded to remain in room.

## 2015-04-26 NOTE — ED Notes (Signed)
Lab called and notified about add on valproic acid level. States they will run it.

## 2015-04-26 NOTE — ED Notes (Signed)
No answer when called name for triage

## 2015-04-26 NOTE — ED Notes (Addendum)
[facility] called. This RN spoke with Charleston Ropes who states patient was brought it because he was jittery, "walking around", and c/o pain to his leg. Daisy suggested to call Tammy the supervisor as regards to sending patient back to facility.   Home: [phone] Cell: [phone]

## 2015-04-26 NOTE — ED Notes (Signed)
Patient states he is here to "get his leg looked at". Patient states he has a mental health hx of anxiety and depression. Patient denies either of these feelings at this time. Denies SI or HI.

## 2015-04-26 NOTE — ED Notes (Signed)
Attempted to call both numbers to reach Tammy, supervisor of [facility]. No answer at this time. Will try again later.
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
## 2015-04-26 NOTE — ED Notes (Signed)
TTS made this RN aware that Behavioral Medicine is not going to accept patient until patient is seen by psychiatry.

## 2015-04-26 NOTE — ED Notes (Signed)
Attempted to call [facility] to speak with caregiver in regards to why patient was sent for behavioral concerns. Line is ringing busy at this time. Will call back later.

## 2015-04-26 NOTE — Discharge Instructions (Signed)
Please seek medical attention for any high fevers, chest pain, shortness of breath, change in behavior, persistent vomiting, bloody stool or any other new or concerning symptoms. ° ° °Cellulitis °Cellulitis is an infection of the skin and the tissue beneath it. The infected area is usually red and tender. Cellulitis occurs most often in the arms and lower legs.  °CAUSES  °Cellulitis is caused by bacteria that enter the skin through cracks or cuts in the skin. The most common types of bacteria that cause cellulitis are staphylococci and streptococci. °SIGNS AND SYMPTOMS  °· Redness and warmth. °· Swelling. °· Tenderness or pain. °· Fever. °DIAGNOSIS  °Your health care provider can usually determine what is wrong based on a physical exam. Blood tests may also be done. °TREATMENT  °Treatment usually involves taking an antibiotic medicine. °HOME CARE INSTRUCTIONS  °· Take your antibiotic medicine as directed by your health care provider. Finish the antibiotic even if you start to feel better. °· Keep the infected arm or leg elevated to reduce swelling. °· Apply a warm cloth to the affected area up to 4 times per day to relieve pain. °· Take medicines only as directed by your health care provider. °· Keep all follow-up visits as directed by your health care provider. °SEEK MEDICAL CARE IF:  °· You notice red streaks coming from the infected area. °· Your red area gets larger or turns dark in color. °· Your bone or joint underneath the infected area becomes painful after the skin has healed. °· Your infection returns in the same area or another area. °· You notice a swollen bump in the infected area. °· You develop new symptoms. °· You have a fever. °SEEK IMMEDIATE MEDICAL CARE IF:  °· You feel very sleepy. °· You develop vomiting or diarrhea. °· You have a general ill feeling (malaise) with muscle aches and pains. °  °This information is not intended to replace advice given to you by your health care provider. Make sure  you discuss any questions you have with your health care provider. °  °Document Released: 01/03/2005 Document Revised: 12/15/2014 Document Reviewed: 06/11/2011 °Elsevier Interactive Patient Education ©2016 Elsevier Inc. ° °

## 2015-04-27 ENCOUNTER — Inpatient Hospital Stay
Admission: EM | Admit: 2015-04-27 | Discharge: 2015-05-04 | DRG: 885 | Disposition: A | Discharge status: Home / Self Care | Payer: 59 | Source: Intra-hospital | Attending: Psychiatry | Admitting: Psychiatry

## 2015-04-27 ENCOUNTER — Inpatient Hospital Stay: Admission: EM | Admit: 2015-04-27 | Payer: 59 | Source: Intra-hospital | Attending: Psychiatry | Admitting: Psychiatry

## 2015-04-27 DIAGNOSIS — F17210 Nicotine dependence, cigarettes, uncomplicated: Secondary | ICD-10-CM | POA: Diagnosis present

## 2015-04-27 DIAGNOSIS — Z9889 Other specified postprocedural states: Secondary | ICD-10-CM

## 2015-04-27 DIAGNOSIS — I10 Essential (primary) hypertension: Secondary | ICD-10-CM | POA: Diagnosis present

## 2015-04-27 DIAGNOSIS — G4700 Insomnia, unspecified: Secondary | ICD-10-CM | POA: Diagnosis present

## 2015-04-27 DIAGNOSIS — Z79899 Other long term (current) drug therapy: Secondary | ICD-10-CM | POA: Diagnosis not present

## 2015-04-27 DIAGNOSIS — F79 Unspecified intellectual disabilities: Secondary | ICD-10-CM | POA: Diagnosis present

## 2015-04-27 DIAGNOSIS — S069XAA Unspecified intracranial injury with loss of consciousness status unknown, initial encounter: Secondary | ICD-10-CM

## 2015-04-27 DIAGNOSIS — Z87820 Personal history of traumatic brain injury: Secondary | ICD-10-CM | POA: Diagnosis not present

## 2015-04-27 DIAGNOSIS — J449 Chronic obstructive pulmonary disease, unspecified: Secondary | ICD-10-CM

## 2015-04-27 DIAGNOSIS — F419 Anxiety disorder, unspecified: Secondary | ICD-10-CM | POA: Diagnosis present

## 2015-04-27 DIAGNOSIS — Z794 Long term (current) use of insulin: Secondary | ICD-10-CM

## 2015-04-27 DIAGNOSIS — E785 Hyperlipidemia, unspecified: Secondary | ICD-10-CM | POA: Diagnosis present

## 2015-04-27 DIAGNOSIS — F17200 Nicotine dependence, unspecified, uncomplicated: Secondary | ICD-10-CM

## 2015-04-27 DIAGNOSIS — I872 Venous insufficiency (chronic) (peripheral): Secondary | ICD-10-CM | POA: Diagnosis present

## 2015-04-27 DIAGNOSIS — M1990 Unspecified osteoarthritis, unspecified site: Secondary | ICD-10-CM | POA: Diagnosis present

## 2015-04-27 DIAGNOSIS — Z800 Family history of malignant neoplasm of digestive organs: Secondary | ICD-10-CM | POA: Diagnosis not present

## 2015-04-27 DIAGNOSIS — Z8701 Personal history of pneumonia (recurrent): Secondary | ICD-10-CM | POA: Diagnosis not present

## 2015-04-27 DIAGNOSIS — E11622 Type 2 diabetes mellitus with other skin ulcer: Secondary | ICD-10-CM | POA: Diagnosis present

## 2015-04-27 DIAGNOSIS — B9562 Methicillin resistant Staphylococcus aureus infection as the cause of diseases classified elsewhere: Secondary | ICD-10-CM | POA: Diagnosis present

## 2015-04-27 DIAGNOSIS — F3160 Bipolar disorder, current episode mixed, unspecified: Secondary | ICD-10-CM | POA: Diagnosis present

## 2015-04-27 DIAGNOSIS — S069X9A Unspecified intracranial injury with loss of consciousness of unspecified duration, initial encounter: Secondary | ICD-10-CM

## 2015-04-27 DIAGNOSIS — L03116 Cellulitis of left lower limb: Secondary | ICD-10-CM | POA: Diagnosis present

## 2015-04-27 DIAGNOSIS — E119 Type 2 diabetes mellitus without complications: Secondary | ICD-10-CM

## 2015-04-27 DIAGNOSIS — L98499 Non-pressure chronic ulcer of skin of other sites with unspecified severity: Secondary | ICD-10-CM | POA: Diagnosis present

## 2015-04-27 LAB — URINALYSIS COMPLETE WITH MICROSCOPIC (ARMC ONLY)
BILIRUBIN URINE: NEGATIVE
Bacteria, UA: NONE SEEN
Glucose, UA: NEGATIVE mg/dL
Hgb urine dipstick: NEGATIVE
KETONES UR: NEGATIVE mg/dL
Leukocytes, UA: NEGATIVE
Nitrite: NEGATIVE
PROTEIN: NEGATIVE mg/dL
SPECIFIC GRAVITY, URINE: 1.005 (ref 1.005–1.030)
SQUAMOUS EPITHELIAL / LPF: NONE SEEN
pH: 6 (ref 5.0–8.0)

## 2015-04-27 LAB — LIPID PANEL
CHOL/HDL RATIO: 2.3 ratio
Cholesterol: 85 mg/dL (ref 0–200)
HDL: 37 mg/dL — ABNORMAL LOW (ref >40)
LDL CALC: 27 mg/dL (ref 0–99)
Triglycerides: 104 mg/dL (ref <150)
VLDL: 21 mg/dL (ref 0–40)

## 2015-04-27 LAB — TSH: TSH: 2.1 u[IU]/mL (ref 0.350–4.500)

## 2015-04-27 MED ORDER — LISINOPRIL 10 MG PO TABS
ORAL_TABLET | ORAL | Status: AC
  Administered 2015-04-27: 5 mg via ORAL
  Filled 2015-04-27: qty 1

## 2015-04-27 MED ORDER — MUPIROCIN CALCIUM 2 % EX CREA
TOPICAL_CREAM | Freq: Two times a day (BID) | CUTANEOUS | Status: DC
  Administered 2015-04-27: 1 via TOPICAL
  Administered 2015-04-28: 10:00:00 via TOPICAL
  Administered 2015-04-28: 1 via TOPICAL
  Administered 2015-04-29 – 2015-05-01 (×6): via TOPICAL
  Administered 2015-05-02: 1 via TOPICAL
  Administered 2015-05-03: 21:00:00 via TOPICAL
  Filled 2015-04-27 (×2): qty 15

## 2015-04-27 MED ORDER — TIOTROPIUM BROMIDE MONOHYDRATE 18 MCG IN CAPS
18.0000 ug | ORAL_CAPSULE | Freq: Every day | RESPIRATORY_TRACT | Status: DC
  Administered 2015-04-28 – 2015-05-04 (×7): 18 ug via RESPIRATORY_TRACT
  Filled 2015-04-27 (×2): qty 5

## 2015-04-27 MED ORDER — METFORMIN HCL 500 MG PO TABS
1000.0000 mg | ORAL_TABLET | Freq: Two times a day (BID) | ORAL | Status: DC
  Administered 2015-04-28: 1000 mg via ORAL
  Filled 2015-04-27: qty 2

## 2015-04-27 MED ORDER — ATORVASTATIN CALCIUM 20 MG PO TABS
10.0000 mg | ORAL_TABLET | Freq: Every day | ORAL | Status: DC
  Administered 2015-04-27 – 2015-05-03 (×7): 10 mg via ORAL
  Filled 2015-04-27 (×4): qty 1
  Filled 2015-04-27: qty 2
  Filled 2015-04-27: qty 1

## 2015-04-27 MED ORDER — ACETAMINOPHEN 325 MG PO TABS
650.0000 mg | ORAL_TABLET | Freq: Four times a day (QID) | ORAL | Status: DC | PRN
  Administered 2015-04-28 – 2015-05-03 (×5): 650 mg via ORAL
  Filled 2015-04-27 (×7): qty 2

## 2015-04-27 MED ORDER — TIOTROPIUM BROMIDE MONOHYDRATE 18 MCG IN CAPS
18.0000 ug | ORAL_CAPSULE | Freq: Every day | RESPIRATORY_TRACT | Status: DC
  Administered 2015-04-27: 18 ug via RESPIRATORY_TRACT
  Filled 2015-04-27: qty 5

## 2015-04-27 MED ORDER — ACETAMINOPHEN 500 MG PO TABS
500.0000 mg | ORAL_TABLET | Freq: Every day | ORAL | Status: DC
Start: 2015-04-27 — End: 2015-04-27
  Administered 2015-04-27: 500 mg via ORAL
  Filled 2015-04-27: qty 1

## 2015-04-27 MED ORDER — TRAZODONE HCL 50 MG PO TABS: 50.0000 mg | ORAL_TABLET | Freq: Every day | ORAL | Status: DC

## 2015-04-27 MED ORDER — DIVALPROEX SODIUM 500 MG PO DR TAB
750.0000 mg | DELAYED_RELEASE_TABLET | Freq: Two times a day (BID) | ORAL | Status: DC
  Administered 2015-04-27 – 2015-04-28 (×2): 750 mg via ORAL
  Filled 2015-04-27 (×2): qty 1

## 2015-04-27 MED ORDER — LORAZEPAM 2 MG PO TABS
2.0000 mg | ORAL_TABLET | Freq: Four times a day (QID) | ORAL | Status: DC | PRN
  Administered 2015-05-03: 2 mg via ORAL
  Filled 2015-04-27: qty 1

## 2015-04-27 MED ORDER — ALUM & MAG HYDROXIDE-SIMETH 200-200-20 MG/5ML PO SUSP: 30.0000 mL | ORAL | Status: DC | PRN

## 2015-04-27 MED ORDER — MUPIROCIN CALCIUM 2 % EX CREA
TOPICAL_CREAM | Freq: Two times a day (BID) | CUTANEOUS | Status: DC
  Administered 2015-04-27: 12:00:00 via TOPICAL
  Filled 2015-04-27: qty 15

## 2015-04-27 MED ORDER — DIVALPROEX SODIUM 500 MG PO DR TAB
500.0000 mg | DELAYED_RELEASE_TABLET | Freq: Two times a day (BID) | ORAL | Status: DC
  Administered 2015-04-27: 500 mg via ORAL
  Filled 2015-04-27: qty 1

## 2015-04-27 MED ORDER — LINAGLIPTIN 5 MG PO TABS
5.0000 mg | ORAL_TABLET | Freq: Every day | ORAL | Status: DC
  Administered 2015-04-28 – 2015-05-04 (×7): 5 mg via ORAL
  Filled 2015-04-27 (×9): qty 1

## 2015-04-27 MED ORDER — RISPERIDONE 1 MG PO TABS
1.5000 mg | ORAL_TABLET | Freq: Once | ORAL | Status: AC
  Administered 2015-04-27: 1.5 mg via ORAL
  Filled 2015-04-27: qty 2

## 2015-04-27 MED ORDER — RISPERIDONE 1 MG PO TABS
2.0000 mg | ORAL_TABLET | Freq: Two times a day (BID) | ORAL | Status: DC
  Administered 2015-04-27: 2 mg via ORAL
  Filled 2015-04-27: qty 2

## 2015-04-27 MED ORDER — DIVALPROEX SODIUM 500 MG PO DR TAB: 750.0000 mg | DELAYED_RELEASE_TABLET | Freq: Two times a day (BID) | ORAL | Status: DC

## 2015-04-27 MED ORDER — TRAMADOL HCL 50 MG PO TABS
50.0000 mg | ORAL_TABLET | Freq: Two times a day (BID) | ORAL | Status: DC
  Administered 2015-04-27: 50 mg via ORAL
  Filled 2015-04-27: qty 1

## 2015-04-27 MED ORDER — TRAMADOL HCL 50 MG PO TABS
50.0000 mg | ORAL_TABLET | Freq: Two times a day (BID) | ORAL | Status: DC
  Administered 2015-04-27 – 2015-05-04 (×12): 50 mg via ORAL
  Filled 2015-04-27 (×13): qty 1

## 2015-04-27 MED ORDER — CLINDAMYCIN HCL 150 MG PO CAPS
300.0000 mg | ORAL_CAPSULE | Freq: Three times a day (TID) | ORAL | Status: DC
  Administered 2015-04-27 – 2015-04-29 (×5): 300 mg via ORAL
  Filled 2015-04-27: qty 2
  Filled 2015-04-27 (×5): qty 1
  Filled 2015-04-27: qty 2
  Filled 2015-04-27: qty 1

## 2015-04-27 MED ORDER — DIVALPROEX SODIUM 500 MG PO DR TAB
DELAYED_RELEASE_TABLET | ORAL | Status: AC
Start: 2015-04-27 — End: 2015-04-27
  Administered 2015-04-27: 500 mg via ORAL
  Filled 2015-04-27: qty 1

## 2015-04-27 MED ORDER — MAGNESIUM HYDROXIDE 400 MG/5ML PO SUSP: 30.0000 mL | Freq: Every day | ORAL | Status: DC | PRN

## 2015-04-27 MED ORDER — METFORMIN HCL 500 MG PO TABS: 1000.0000 mg | ORAL_TABLET | Freq: Two times a day (BID) | ORAL | Status: DC

## 2015-04-27 MED ORDER — ATORVASTATIN CALCIUM 20 MG PO TABS: 10.0000 mg | ORAL_TABLET | Freq: Every day | ORAL | Status: DC

## 2015-04-27 MED ORDER — LORAZEPAM 2 MG PO TABS: 2.0000 mg | ORAL_TABLET | Freq: Four times a day (QID) | ORAL | Status: DC | PRN

## 2015-04-27 MED ORDER — TRAMADOL HCL 50 MG PO TABS
50.0000 mg | ORAL_TABLET | Freq: Once | ORAL | Status: AC
  Administered 2015-04-27: 50 mg via ORAL
  Filled 2015-04-27: qty 1

## 2015-04-27 MED ORDER — CLINDAMYCIN HCL 150 MG PO CAPS
300.0000 mg | ORAL_CAPSULE | Freq: Three times a day (TID) | ORAL | Status: DC
Start: 2015-04-27 — End: 2015-04-27
  Administered 2015-04-27: 300 mg via ORAL
  Filled 2015-04-27 (×2): qty 1

## 2015-04-27 MED ORDER — RISPERIDONE 1 MG PO TABS
2.0000 mg | ORAL_TABLET | Freq: Two times a day (BID) | ORAL | Status: DC
  Administered 2015-04-27 – 2015-05-04 (×14): 2 mg via ORAL
  Filled 2015-04-27 (×15): qty 2

## 2015-04-27 MED ORDER — TRAZODONE HCL 50 MG PO TABS
50.0000 mg | ORAL_TABLET | Freq: Every day | ORAL | Status: DC
  Administered 2015-04-27 – 2015-04-28 (×2): 50 mg via ORAL
  Filled 2015-04-27 (×2): qty 1

## 2015-04-27 MED ORDER — LISINOPRIL 5 MG PO TABS
5.0000 mg | ORAL_TABLET | Freq: Every day | ORAL | Status: DC
  Administered 2015-04-27: 5 mg via ORAL

## 2015-04-27 MED ORDER — LISINOPRIL 5 MG PO TABS
5.0000 mg | ORAL_TABLET | Freq: Every day | ORAL | Status: DC
  Administered 2015-04-28: 5 mg via ORAL
  Filled 2015-04-27: qty 1

## 2015-04-27 MED ORDER — LINAGLIPTIN 5 MG PO TABS
5.0000 mg | ORAL_TABLET | Freq: Every day | ORAL | Status: DC
  Administered 2015-04-27: 5 mg via ORAL
  Filled 2015-04-27: qty 1

## 2015-04-27 MED ORDER — RISPERIDONE 1 MG PO TABS: 1.0000 mg | ORAL_TABLET | Freq: Every day | ORAL | Status: DC

## 2015-04-27 MED ORDER — ACETAMINOPHEN 500 MG PO TABS
500.0000 mg | ORAL_TABLET | Freq: Every day | ORAL | Status: DC
  Filled 2015-04-27: qty 1
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
## 2015-04-27 NOTE — ED Notes (Signed)
Pt continues to be ambulatory at this time. Pt cooperative but noted to be mildly irritable at this time. Pt able to be redirected to his room by staff.

## 2015-04-27 NOTE — ED Notes (Signed)
Attempted to call report to [facility] regarding patients discharge. Daisy, staff at nursing facility states, "I dont know what you want me to do. I'm the only one here, taking care of 60 patients. Call Tammy again." Tammy called, no answer at this time. Will call back later.

## 2015-04-27 NOTE — ED Notes (Signed)
This RN was notified by [employer] that they tried to reach [place], the supervisor, but were unsuccessful. [employer] notified staff at [facility] that by law they are obligated to pick up the patient. Staff told [employer] they would call Tammy and call back.

## 2015-04-27 NOTE — Progress Notes (Signed)
°   04/26/15 1630  Clinical Encounter Type  Visited With Patient;Health care provider  Visit Type Initial  Consult/Referral To Other (Comment)  Spiritual Encounters  Spiritual Needs Emotional  Stress Factors  Patient Stress Factors Health changes;Loss of control  Family Stress Factors Not reviewed  Assisted patient and long-term care facility nurse with navigating hospital and completing registration. [place] [phone]

## 2015-04-27 NOTE — ED Notes (Signed)
Pt waiting for adm to beh med, he is cooperative , i explained to him that he is waiting here for his bed to be ready and he is agreeable with plan, sat down and watched tv , then was talking to a peer . He appears in no acute distress

## 2015-04-27 NOTE — ED Notes (Signed)
Tammy, nursing facility supervisor called. Tammy answered the phone and instructed this RN that they do not have transportation at 3am. When asked what time they would be able to pick up the patient so we can have him ready for discharge she states, "I have no idea. I will call you in the morning when I get into work". When asked what time she gets into work she states, "I don't know. It changes every day. I will call you when I go in to work in the morning". Charge nurse aware.
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
## 2015-04-27 NOTE — Progress Notes (Signed)
Patient transferred to unit in w/c.  Immediately began wailing in discomfort.  Skin search complete.  Patient denies SI/HI. Speech impediment noted.  Walker given and orientation to room and unit.  Report to oncoming shift of need to complete admission.

## 2015-04-27 NOTE — ED Notes (Signed)
Pt alert and standing in the hall at this time. Pt redirected back to his room by security and staff. NAD Noted at this time.
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
## 2015-04-27 NOTE — ED Notes (Signed)
Report given to Ruthie, Therapist, sports.

## 2015-04-27 NOTE — ED Notes (Signed)
NAD noted at this time. Pt alert in room. Pt continues to walk back and forth to the bathroom without reason at this time. Will continue to monitor.

## 2015-04-27 NOTE — ED Notes (Signed)
Pt. Is c/o pain in his leg and he is requesting pain medication.    Told pt that this RN will discuss with provider.   Pt was in agreement with that plan

## 2015-04-27 NOTE — ED Notes (Signed)
Pt instructed to stay in his room at this time. NAD noted. Pt ambulatory without difficulty at this time.

## 2015-04-27 NOTE — ED Notes (Signed)
Pt adm ivc to beh med

## 2015-04-27 NOTE — ED Provider Notes (Signed)
-----------------------------------------   7:33 AM on 04/27/2015 -----------------------------------------   Blood pressure 134/92, pulse 102, temperature 98.3 F (36.8 C), temperature source Oral, resp. rate 20, height 6\' 2"  (1.88 m), weight 200 lb (90.719 kg), SpO2 97 %.  The patient had no acute events since last update.  Calm and cooperative at this time.  Disposition is pending per Psychiatry/Behavioral Medicine team recommendations.     Nena Polio, MD 04/27/15 [phone]

## 2015-04-27 NOTE — ED Notes (Signed)
Tammy called and requesting to speak with Dr. Archie Balboa.

## 2015-04-27 NOTE — ED Notes (Signed)
Pt continues to ask about his medications, MD notified at this time. Pt ambulatory in the hallway. Pt repeatedly states that he "wants to go home" at this time.

## 2015-04-27 NOTE — ED Notes (Signed)
Pt ambulatory to the trashcan and to the bathroom. NAD Noted at this time. Respirations even and unlabored. Pt continues to be irritable at this time.

## 2015-04-27 NOTE — ED Notes (Signed)

## 2015-04-28 DIAGNOSIS — S069XAA Unspecified intracranial injury with loss of consciousness status unknown, initial encounter: Secondary | ICD-10-CM

## 2015-04-28 DIAGNOSIS — E785 Hyperlipidemia, unspecified: Secondary | ICD-10-CM

## 2015-04-28 DIAGNOSIS — I10 Essential (primary) hypertension: Secondary | ICD-10-CM

## 2015-04-28 DIAGNOSIS — J449 Chronic obstructive pulmonary disease, unspecified: Secondary | ICD-10-CM

## 2015-04-28 DIAGNOSIS — S069X9A Unspecified intracranial injury with loss of consciousness of unspecified duration, initial encounter: Secondary | ICD-10-CM

## 2015-04-28 DIAGNOSIS — F17200 Nicotine dependence, unspecified, uncomplicated: Secondary | ICD-10-CM

## 2015-04-28 LAB — CBC WITH DIFFERENTIAL/PLATELET
BASOS ABS: 0.1 10*3/uL (ref 0–0.1)
BASOS PCT: 1 %
EOS ABS: 0.2 10*3/uL (ref 0–0.7)
Eosinophils Relative: 2 %
HCT: 34.9 % — ABNORMAL LOW (ref 40.0–52.0)
HEMOGLOBIN: 11.4 g/dL — AB (ref 13.0–18.0)
Lymphocytes Relative: 27 %
Lymphs Abs: 2.5 10*3/uL (ref 1.0–3.6)
MCH: 29.8 pg (ref 26.0–34.0)
MCHC: 32.6 g/dL (ref 32.0–36.0)
MCV: 91.5 fL (ref 80.0–100.0)
Monocytes Absolute: 1 10*3/uL (ref 0.2–1.0)
Monocytes Relative: 11 %
NEUTROS ABS: 5.4 10*3/uL (ref 1.4–6.5)
NEUTROS PCT: 59 %
Platelets: 245 10*3/uL (ref 150–440)
RBC: 3.81 MIL/uL — AB (ref 4.40–5.90)
RDW: 14.8 % — ABNORMAL HIGH (ref 11.5–14.5)
WBC: 9.2 10*3/uL (ref 3.8–10.6)

## 2015-04-28 LAB — GLUCOSE, CAPILLARY
GLUCOSE-CAPILLARY: 107 mg/dL — AB (ref 65–99)
GLUCOSE-CAPILLARY: 141 mg/dL — AB (ref 65–99)
GLUCOSE-CAPILLARY: 168 mg/dL — AB (ref 65–99)

## 2015-04-28 LAB — AMMONIA: Ammonia: 23 umol/L (ref 9–35)

## 2015-04-28 LAB — HEMOGLOBIN A1C: Hgb A1c MFr Bld: 6.3 % — ABNORMAL HIGH (ref 4.0–6.0)

## 2015-04-28 MED ORDER — ALBUTEROL SULFATE HFA 108 (90 BASE) MCG/ACT IN AERS
2.0000 | INHALATION_SPRAY | RESPIRATORY_TRACT | Status: DC | PRN
  Administered 2015-05-01: 2 via RESPIRATORY_TRACT
  Filled 2015-04-28: qty 6.7

## 2015-04-28 MED ORDER — METOPROLOL TARTRATE 25 MG PO TABS
12.5000 mg | ORAL_TABLET | Freq: Two times a day (BID) | ORAL | Status: DC
  Administered 2015-04-28 – 2015-05-01 (×7): 12.5 mg via ORAL
  Administered 2015-05-01: 09:00:00 via ORAL
  Administered 2015-05-02 – 2015-05-04 (×5): 12.5 mg via ORAL
  Filled 2015-04-28 (×9): qty 1
  Filled 2015-04-28: qty 0.5
  Filled 2015-04-28 (×4): qty 1

## 2015-04-28 MED ORDER — LITHIUM CARBONATE ER 300 MG PO TBCR
300.0000 mg | EXTENDED_RELEASE_TABLET | Freq: Two times a day (BID) | ORAL | Status: DC
  Administered 2015-04-28 – 2015-05-04 (×11): 300 mg via ORAL
  Filled 2015-04-28 (×14): qty 1

## 2015-04-28 MED ORDER — METFORMIN HCL ER 500 MG PO TB24
1000.0000 mg | ORAL_TABLET | Freq: Two times a day (BID) | ORAL | Status: DC
  Administered 2015-04-28 – 2015-05-04 (×12): 1000 mg via ORAL
  Filled 2015-04-28 (×10): qty 2
  Filled 2015-04-28: qty 1
  Filled 2015-04-28 (×2): qty 2

## 2015-04-28 MED ORDER — DIVALPROEX SODIUM 500 MG PO DR TAB
500.0000 mg | DELAYED_RELEASE_TABLET | Freq: Two times a day (BID) | ORAL | Status: DC
  Administered 2015-04-28 – 2015-05-04 (×12): 500 mg via ORAL
  Filled 2015-04-28 (×12): qty 1

## 2015-04-28 NOTE — [facility] Suicide Risk Assessment (Signed)
[facility] INPATIENT:  Family/Significant Other Suicide Prevention Education  Suicide Prevention Education:  Patient Refusal for Family/Significant Other Suicide Prevention Education: The patient Scott [name] has refused to provide written consent for family/significant other to be provided Family/Significant Other Suicide Prevention Education during admission and/or prior to discharge.  Physician notified.  CSW attempted to complete SPE with pt, pt received written SPE, but refused verbal SPE.  Alphonse Guild Kimoni Pickerill 04/28/2015, 2:35 PM

## 2015-04-28 NOTE — Tx Team (Deleted)
Initial Interdisciplinary Treatment Plan   PATIENT STRESSORS: Health problems   PATIENT STRENGTHS: Communication skills Religious Affiliation   PROBLEM LIST: Problem List/Patient Goals Date to be addressed Date deferred Reason deferred Estimated date of resolution                                                         DISCHARGE CRITERIA:  Adequate post-discharge living arrangements Improved stabilization in mood, thinking, and/or behavior Medical problems require only outpatient monitoring  PRELIMINARY DISCHARGE PLAN: Placement in alternative living arrangements  PATIENT/FAMIILY INVOLVEMENT: This treatment plan has been presented to and reviewed with the patient, Scott [name],d make suggestions.  Scott [name] 04/28/2015, 5:14 AM

## 2015-04-28 NOTE — Progress Notes (Signed)
Patient right leg with reddened, dry skin and foot with +2 edema. Cream applied to leg as ordered and MD aware. MD orders consult and consult into assess and considers orders. Patient informed to keep feet elevated in wheelchair and bed. Patient states his legs " have been like prior to admit". Patient non compliant with MD recommendations and noted to ambulate through unit. No distress, no complaint.
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
## 2015-04-28 NOTE — Progress Notes (Signed)
Recreation Therapy Notes  Date: 01.19.17 Time: 3:00 pm Location: Craft Room  Group Topic: Leisure Education  Goal Area(s) Addresses:  Patient will identify activities for each letter of the alphabet. Patient will verbalize ability to integrate positive leisure into life post d/c. Patient will verbalize ability to use leisure as a Technical sales engineer.  Behavioral Response: Did not attend   Intervention: Leisure Alphabet  Activity: Patients were given a Leisure Air traffic controller and instructed to list healthy leisure activities for each letter of the alphabet.  Education: LRT educated patients on what they needed to participate in leisure.  Education Outcome: Patient did not attend group.   Clinical Observations/Feedback: Patient did not attend group.  Leonette Monarch, LRT/CTRS 04/28/2015 4:25 PM

## 2015-04-28 NOTE — Progress Notes (Signed)
Patient denies SI/HI/AVH. Patient is pleasant and cooperative. Patient is medication compliant. Infectious disease paged for consult, still waiting on reply. Patient utilizes walker during shift. No unsafe behaviors noted. Will continue to monitor.
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
## 2015-04-28 NOTE — BHH Group Notes (Signed)
[place] LCSW Group Therapy  04/28/2015 12:57 PM  Type of Therapy:  Group Therapy  Participation Level:  Did Not Attend  Modes of Intervention:  Discussion, Education, Socialization and Support  Summary of Progress/Problems: Emotional Regulation: Patients will identify both negative and positive emotions. They will discuss emotions they have difficulty regulating and how they impact their lives. Patients will be asked to identify healthy coping skills to combat unhealthy reactions to negative emotions.     [place] MSW, [place]  04/28/2015, 12:57 PM

## 2015-04-28 NOTE — Tx Team (Signed)
Initial Interdisciplinary Treatment Plan   PATIENT STRESSORS: Health problems   PATIENT STRENGTHS: Communication skills General fund of knowledge   PROBLEM LIST: Problem List/Patient Goals Date to be addressed Date deferred Reason deferred Estimated date of resolution  Cellulitis 04/28/2015     Bipolar disorder 04/28/2015     Mental Retardation 1/192017                                          DISCHARGE CRITERIA:  Adequate post-discharge living arrangements Improved stabilization in mood, thinking, and/or behavior Medical problems require only outpatient monitoring  PRELIMINARY DISCHARGE PLAN: Placement in alternative living arrangements  PATIENT/FAMIILY INVOLVEMENT: This treatment plan has been presented to and reviewed with the patient, Scott [name].  The patient and family have been given the opportunity to ask questions and make suggestions.  Pete Glatter 04/28/2015, 5:17 AM

## 2015-04-28 NOTE — BHH Group Notes (Signed)
[place] Group Notes:  (Nursing/MHT/Case Management/Adjunct)  Date:  04/28/2015  Time:  12:04 AM  Type of Therapy:  Evening Wrap-up Group  Participation Level:  Active  Participation Quality:  Attentive  Affect:  Appropriate  Cognitive:  Appropriate  Insight:  Appropriate  Engagement in Group:  Developing/Improving and Engaged  Modes of Intervention:  Discussion  Summary of Progress/Problems:  Levonne Spiller 04/28/2015, 12:04 AM

## 2015-04-28 NOTE — BHH Suicide Risk Assessment (Signed)
[facility] Admission Suicide Risk Assessment   Nursing information obtained from:    Demographic factors:    Current Mental Status:    Loss Factors:    Historical Factors:    Risk Reduction Factors:     Total Time spent with patient: 1 hour Principal Problem: Bipolar 1 disorder, mixed ([place]) Diagnosis:   Patient Active Problem List   Diagnosis Date Noted  . Tobacco use disorder [F17.200] 04/28/2015  . Dyslipidemia [E78.5] 04/28/2015  . Essential hypertension [I10] 04/28/2015  . COPD (chronic obstructive pulmonary disease) ([place]) [J44.9] 04/28/2015  . Bipolar 1 disorder, mixed ([place]) [F31.60] 04/27/2015  . Diabetes ([place]) [E11.9] 04/27/2015  . Left leg cellulitis [id number] 04/05/2015  . Mental retardation [F79] 04/05/2015  . Diverticulosis of colon without hemorrhage [K57.30]   . Fatty liver [K76.0] 01/27/2015  . Gallbladder polyp [K82.4] 01/27/2015   Subjective Data: see admission  Continued Clinical Symptoms:  Alcohol Use Disorder Identification Test Final Score (AUDIT): 0 The "Alcohol Use Disorders Identification Test", Guidelines for Use in Primary Care, Second Edition.  World Pharmacologist [facility]). Score between 0-7:  no or low risk or alcohol related problems. Score between 8-15:  moderate risk of alcohol related problems. Score between 16-19:  high risk of alcohol related problems. Score 20 or above:  warrants further diagnostic evaluation for alcohol dependence and treatment.   CLINICAL FACTORS:   Currently Psychotic Previous Psychiatric Diagnoses and Treatments Medical Diagnoses and Treatments/Surgeries    Psychiatric Specialty Exam: ROS                                                          COGNITIVE FEATURES THAT CONTRIBUTE TO RISK:  Closed-mindedness    SUICIDE RISK:   Mild:  Suicidal ideation of limited frequency, intensity, duration, and specificity.  There are no identifiable plans, no associated intent, mild dysphoria and  related symptoms, good self-control (both objective and subjective assessment), few other risk factors, and identifiable protective factors, including available and accessible social support.  PLAN OF CARE: admit to [facility]  I certify that inpatient services furnished can reasonably be expected to improve the patient's condition.   Hildred Priest, MD 04/28/2015, 2:04 PM
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
## 2015-04-28 NOTE — Progress Notes (Signed)
Patient with depressed affect, cooperative behavior with meals, meds and plan of care. No SI/HI/AVH at this time. Verbalizes needs appropriately with staff. Encouraged to attend therapy group to learn and initiate coping skills for management of stressors. No distress, no complaint. Safety maintained.

## 2015-04-28 NOTE — BHH Group Notes (Signed)
[facility] Group Notes:  (Nursing/MHT/Case Management/Adjunct)  Date:  04/28/2015  Time:  1:52 PM  Type of Therapy:  Group Therapy  Participation Level:  Minimal  Participation Quality:  Inattentive  Affect:  Flat  Cognitive:  Lacking  Insight:  Lacking  Engagement in Group:  Lacking  Modes of Intervention:  Activity  Summary of Progress/Problems:  Scott [name] 04/28/2015, 1:52 PM

## 2015-04-28 NOTE — Plan of Care (Signed)
Problem: Consults Goal: [facility] General Treatment Patient Education Outcome: Progressing Calm and cooperative with plan of care.

## 2015-04-29 LAB — GLUCOSE, CAPILLARY
Glucose-Capillary: 117 mg/dL — ABNORMAL HIGH (ref 65–99)
Glucose-Capillary: 118 mg/dL — ABNORMAL HIGH (ref 65–99)

## 2015-04-29 LAB — PROLACTIN: Prolactin: 38 ng/mL — ABNORMAL HIGH (ref 4.0–15.2)

## 2015-04-29 MED ORDER — LINEZOLID 600 MG PO TABS: 600.0000 mg | ORAL_TABLET | Freq: Two times a day (BID) | ORAL | Status: DC

## 2015-04-29 MED ORDER — CEPHALEXIN 500 MG PO CAPS
500.0000 mg | ORAL_CAPSULE | Freq: Two times a day (BID) | ORAL | Status: DC
  Administered 2015-04-29 – 2015-05-04 (×10): 500 mg via ORAL
  Filled 2015-04-29 (×12): qty 1

## 2015-04-29 MED ORDER — LORAZEPAM 2 MG PO TABS
2.0000 mg | ORAL_TABLET | Freq: Every day | ORAL | Status: DC
  Administered 2015-04-29 – 2015-05-01 (×3): 2 mg via ORAL
  Filled 2015-04-29 (×3): qty 1

## 2015-04-29 NOTE — BHH Group Notes (Signed)
[facility] Group Notes:  (Nursing/MHT/Case Management/Adjunct)  Date:  04/29/2015  Time:  12:32 PM  Type of Therapy:  Psychoeducational Skills  Participation Level:  Did Not Attend  Jaivon Vanbeek De'Chelle Kris Burd 04/29/2015, 12:32 PM

## 2015-04-29 NOTE — Plan of Care (Signed)
Problem: Ineffective individual coping Goal: STG: Patient will participate in after care plan Outcome: Not Progressing Patient is not participating in treatment plan.
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
## 2015-04-29 NOTE — Progress Notes (Signed)
Patient was crying this morning for pain,pain medicine given,no pain verbalized later.Denies SI,HI & AV hallucination.Appropriate with staff & peers.Attended groups.Compliant with medications.Infectious disease consulted.Appetite good.

## 2015-04-29 NOTE — Progress Notes (Signed)
Recreation Therapy Notes  INPATIENT RECREATION THERAPY ASSESSMENT  Patient Details Name: Scott [name] MRN: [id number] DOB: March 29, 1961 Today's Date: 2015/05/19  Patient Stressors: Death, Friends, Other (Comment) (Son died 2 years ago; lack of supportive friends; no cigarettes, no sleep)  Coping Skills:   Isolate, Arguments, Exercise, Art/Dance, Music, Sports  Personal Challenges: Anger, Concentration, Social Interaction, Stress Management, Trusting Others  Leisure Interests (2+):  Sports - Basketball, Sports - Other (Comment) (Football)  Awareness of Community Resources:  Yes  Community Resources:  Other (Comment) (Places to do bingo and arts and crafts)  Current Use: Yes  If no, Barriers?:    Patient Strengths:  Living, God loves him  Patient Identified Areas of Improvement:  No  Current Recreation Participation:  Playing basketball  Patient Goal for Hospitalization:  To do better when he is out  [place] of Residence:  [place] of Residence:  [place]   Current [place] (including self-harm):  No  Current HI:  Yes  Consent to Intern Participation: N/A   Leonette Monarch, LRT/CTRS 19-May-2015, 4:47 PM

## 2015-04-29 NOTE — Progress Notes (Signed)
Recreation Therapy Notes  Date: 01.20.17 Time: 3:00 pm Location: Craft Room  Group Topic: Coping Skills  Goal Area(s) Addresses:  Patient will participate in healthy coping skill.  Patient will verbalize one emotion experienced in group.  Behavioral Response: Did not attend  Intervention: Coloring  Activity: Patients were given coloring sheets and instructed to color.  Education: LRT educated patients to color.  Education Outcome: Patient did not attend group.  Clinical Observations/Feedback: Patient did not attend group.  Leonette Monarch, LRT/CTRS 04/29/2015 4:33 PM
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
## 2015-04-29 NOTE — Progress Notes (Signed)
PT Cancellation Note  Patient Details Name: Scott [name] MRN: [id number] DOB: 12-28-1960   Cancelled Treatment:    Reason Eval/Treat Not Completed:  (spoke with NA & charge nurse, has been walking w/o issue) Spoke with NA who has been with pt and charge nurse and they both confirm that the patient has been able to ambulate around the unit w/o assist, w/o AD and generally appears to be safe and at his baseline.  He is not described as having instability and it does not appear that he requires PT intervention at this time.  Will d/c PT orders per this conversation.  If pt is significantly different than his baseline or exhibits unstable gait that would cause safety concerns please feel free to re-order PT.  Will sign off.   Wayne Both, PT, DPT [phone]  Kreg Shropshire 04/29/2015, 1:23 PM
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
## 2015-04-29 NOTE — Progress Notes (Signed)
D: Pt denies SI/HI/AVH. Pt is angry, irritable, unwillingly to participate and cooperative with plan of care. Patient  appears apprehensive and suspicious of staff. As the evening approached patient was noted to have increased confusion and verbal aggression towards staff. Patient is not interacting appropriately with peers and staff.  A: Pt was offered support and encouragement. Pt was given scheduled medications. Pt was encouraged to attend groups. Q 15 minute checks were done for safety.  R:Pt did not attend group.Marland Kitchen Pt is taking medication. Pt is not  receptive to treatment. Safety maintained on unit.

## 2015-04-29 NOTE — Plan of Care (Signed)
Problem: Ineffective individual coping Goal: STG: Patient will remain free from self harm Outcome: Progressing Denies suicidal ideations.     

## 2015-04-30 DIAGNOSIS — F3160 Bipolar disorder, current episode mixed, unspecified: Principal | ICD-10-CM

## 2015-04-30 LAB — GLUCOSE, CAPILLARY
GLUCOSE-CAPILLARY: 110 mg/dL — AB (ref 65–99)
Glucose-Capillary: 112 mg/dL — ABNORMAL HIGH (ref 65–99)

## 2015-04-30 NOTE — Plan of Care (Signed)
Problem: Ineffective individual coping Goal: STG-Increase in ability to manage activities of daily living Outcome: Progressing Patient is managing his activities of daily living on his own

## 2015-04-30 NOTE — Plan of Care (Signed)
Problem: Ineffective individual coping Goal: LTG: Patient will report a decrease in negative feelings Outcome: Progressing Patient reports feeling better today than he did yesterday

## 2015-04-30 NOTE — BHH Group Notes (Signed)
[facility] LCSW Aftercare Discharge Planning Group Note   04/30/2015 10:51 AM Late entry for 04/29/15  Participation Quality:  Did not attend   [place] MSW, [place]

## 2015-04-30 NOTE — BHH Group Notes (Signed)
[place] LCSW Group Therapy  04/30/2015 1:57 PM  Type of Therapy: Group Therapy  Participation Level: Minimal  Participation Quality: Attentive  Affect: Flat  Cognitive: Alert  Insight: Limited  Engagement in Therapy: Limited  Modes of Intervention: Discussion, Education, Socialization and Support  Summary of Progress/Problems: Feelings around Relapse. Group members discussed the meaning of relapse and shared personal stories of relapse, how it affected them and others, and how they perceived themselves during this time. Group members were encouraged to identify triggers, warning signs and coping skills used when facing the possibility of relapse. Social supports were discussed and explored in detail. Scott [name] attended group and stayed the entire time. He sat quietly and listened to other group members.   [place] MSW, [place]  04/30/2015, 1:57 PM

## 2015-04-30 NOTE — Plan of Care (Signed)
Problem: Ineffective individual coping Goal: STG: Patient will remain free from self harm Outcome: Progressing Patient denies SI/HI .      

## 2015-04-30 NOTE — Progress Notes (Signed)
D:  Patient is alert and oriented on the unit this shift.  Patient attends groups and minimally participates today.  Patient denies suicidal ideation, homicidal ideation, auditory or visual hallucinations currently.   A:  Scheduled medications administered to patient per MD orders.  Emotional support and encouragement were provided.  Patient is maintained on q.15 minute safety checks.  Patient informed to notify staff with any questions or concerns.  Unna boot was placed on patient's left foot. R:  No adverse medication reactions were noted.  Patient was cooperative with medication administration and treatment plan.  Patient was receptive, calm and cooperative today.  Patient interacts with others on the unit.  Patient contracts for safety on the unit today.  Patient remains safe at this time.

## 2015-04-30 NOTE — BHH Group Notes (Signed)
[place] LCSW Group Therapy  04/30/2015 10:51 AM Late entry for 04/29/15  Type of Therapy:  Group Therapy  Participation Level:  Did Not Attend  Modes of Intervention:  Discussion, Education, Socialization and Support  Summary of Progress/Problems: Balance in life: Patients will discuss the concept of balance and how it looks and feels to be unbalanced. Pt will identify areas in their life that is unbalanced and ways to become more balanced.    [place] MSW, LCSWA  04/30/2015, 10:51 AM
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
## 2015-04-30 NOTE — Progress Notes (Signed)
D: Pt denies SI/HI/AVH. Pt is irritable and angry towards staff.  Pt's thoughts are disorganized, and he is interacting not with peers and staff appropriately.  A: Pt was offered support and encouragement. Pt was given scheduled medications. Pt was encouraged to attend groups. Q 15 minute checks were done for safety.  R:Pt attends groups and does not interacts well with peers and staff. Pt is taking medication. Pt is not receptive to treatment,  safety maintained on unit.

## 2015-05-01 LAB — GLUCOSE, CAPILLARY
GLUCOSE-CAPILLARY: 122 mg/dL — AB (ref 65–99)
Glucose-Capillary: 113 mg/dL — ABNORMAL HIGH (ref 65–99)
Glucose-Capillary: 155 mg/dL — ABNORMAL HIGH (ref 65–99)
Glucose-Capillary: 118 mg/dL — ABNORMAL HIGH (ref 65–99)

## 2015-05-01 NOTE — BHH Group Notes (Signed)
[place] LCSW Group Therapy  05/01/2015 3:05 PM  Type of Therapy:  Group Therapy  Participation Level:  Minimal  Participation Quality:  Attentive  Affect:  Appropriate  Cognitive:  Alert  Insight:  Limited  Engagement in Therapy:  Limited  Modes of Intervention:  Discussion, Education, Socialization and Support  Summary of Progress/Problems:  Mindfulness: Patient discussed mindfulness and relaxing techniques and why they are beneficial. Pt discussed ways to incorporate mindfulness in their lives. Pt practiced a mindfulness techique and discussed how it made them feel. Pt attended group and stayed most of the time. Pt sat quietly and listened to group members share.   [place] MSW, [place]  05/01/2015, 3:05 PM

## 2015-05-01 NOTE — BHH Group Notes (Signed)
[place] Group Notes:  (Nursing/MHT/Case Management/Adjunct)  Date:  05/01/2015  Time:  12:06 AM  Type of Therapy:  Group Therapy  Participation Level:  Active  Participation Quality:  Appropriate  Affect:  Appropriate  Cognitive:  Appropriate  Insight:  Appropriate  Engagement in Group:  Engaged  Modes of Intervention:  Support  Summary of Progress/Problems:  Lavera Guise [place] 05/01/2015, 12:06 AM
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
## 2015-05-01 NOTE — Progress Notes (Signed)
Patient with sad affect, cooperative with meals and meds. Resistive to recommendations. Patient noted to be drowsy this am. Recommended patient rests in bed while drowsy. Patient noted to be ambulating in hall to dayroom. Recommended patient keeps legs elevated while resting in bed or when in wheelchair. Patient noted to ambulate hall and be in dayroom. RLE remains with ace wrap by MD, patient states MD to assess tomorrow and does not want ace wrap removed. Cream applied to slight redness to right knee cap and LLE. Takes tylenol, refuses Ultram stating maybe it is making me drowsy. No distress, no complaint. Safety maintained.

## 2015-05-01 NOTE — Plan of Care (Signed)
Problem: Ineffective individual coping Goal: LTG: Patient will report a decrease in negative feelings Outcome: Progressing Patient denies SI/HI.      
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
## 2015-05-01 NOTE — Plan of Care (Signed)
Problem: Consults Goal: [facility] General Treatment Patient Education Outcome: Progressing Patient cooperative with plan of care. Not compliant with recommendations. Patient not compliant with keeping legs elevated, using wheelchair and resting in bed when drowsy.

## 2015-05-01 NOTE — Progress Notes (Signed)
D: Pt denies SI/HI/AVH. Pt is blunted and sometimes irritable,but cooperative with treatment plan. Patient appears less anxious and he is interacting with peers and staff appropriately.  A: Pt was offered support and encouragement. Pt was given scheduled medications. Pt was encouraged to attend groups. Q 15 minute checks were done for safety.  R:Pt attends groups and interacts well with peers and staff. Pt is taking medication. Pt has no complaints.Pt receptive to treatment and safety maintained on unit.

## 2015-05-02 LAB — BASIC METABOLIC PANEL
ANION GAP: 8 (ref 5–15)
BUN: 25 mg/dL — ABNORMAL HIGH (ref 6–20)
CHLORIDE: 105 mmol/L (ref 101–111)
CO2: 24 mmol/L (ref 22–32)
Calcium: 9 mg/dL (ref 8.9–10.3)
Creatinine, Ser: 1 mg/dL (ref 0.61–1.24)
GFR calc non Af Amer: >60 mL/min (ref >60)
GLUCOSE: 109 mg/dL — AB (ref 65–99)
POTASSIUM: 4.5 mmol/L (ref 3.5–5.1)
Sodium: 137 mmol/L (ref 135–145)

## 2015-05-02 LAB — GLUCOSE, CAPILLARY
GLUCOSE-CAPILLARY: 116 mg/dL — AB (ref 65–99)
GLUCOSE-CAPILLARY: 101 mg/dL — AB (ref 65–99)

## 2015-05-02 LAB — LITHIUM LEVEL: Lithium Lvl: 0.43 mmol/L — ABNORMAL LOW (ref 0.60–1.20)

## 2015-05-02 LAB — VALPROIC ACID LEVEL: Valproic Acid Lvl: 46 ug/mL — ABNORMAL LOW (ref 50.0–100.0)

## 2015-05-02 MED ORDER — TEMAZEPAM 15 MG PO CAPS
30.0000 mg | ORAL_CAPSULE | Freq: Every evening | ORAL | Status: DC | PRN
  Administered 2015-05-02 – 2015-05-04 (×2): 30 mg via ORAL
  Filled 2015-05-02 (×2): qty 2

## 2015-05-02 NOTE — Evaluation (Signed)
Physical Therapy Evaluation Patient Details Name: Scott [name] MRN: [id number] DOB: 08/21/1960 Today's Date: 05/02/2015   History of Present Illness  Patient is a 55 y/o male admitted from a group home with a personal history of TBI and bipolar disorder. Apparently he has been more verbally aggressive with staff recently, with concern regarding his behavior.   Clinical Impression  Patient presents from group home with history of TBI and bipolar disorder. He is noted to have unna boot on LLE for cellulitis and has been using a wheelchair intermittently for mobility while at [facility] for pain control. Patient reports no falls in his recent past and use of SPC while at home, in this session he ambulates both independently and with RW. Modified DGI of 11/12 with both, without AD his major deficit is narrow BOS during gait and decreased step lengths bilaterally though per patient this is baseline for him. Patient reports he feels more comfortable ambulating with RW in this session and would be appropriate for RW use at home if he desires. Otherwise patient appears to be at his baseline level of mobility and does not require any further PT services.     Follow Up Recommendations No PT follow up    Equipment Recommendations  Rolling walker with 5" wheels    Recommendations for Other Services       Precautions / Restrictions Precautions Precautions: None Restrictions Weight Bearing Restrictions: No      Mobility  Bed Mobility               General bed mobility comments: Pt received in wheelchair.   Transfers Overall transfer level: Independent               General transfer comment: No deficits in sit to stand transfer aside from trunk flexed during transfer, no loss of balance noted.   Ambulation/Gait Ambulation/Gait assistance: Supervision Ambulation Distance (Feet): 150 Feet   Gait Pattern/deviations: Decreased step length - left;Decreased step length - right;Narrow base  of support;Trunk flexed   Gait velocity interpretation: Below normal speed for age/gender General Gait Details: Patient ambulates with decreased step length and floor clearance bilaterally which appears to be his baseline. He demonstrates decreased gait speed, though it appears to be within his normal limits. Patient asked to ambulate with RW and demonstrated no deficits (perhaps mild deficit in stride length).   Stairs            Wheelchair Mobility    Modified Rankin (Stroke Patients Only)       Balance Overall balance assessment: Modified Independent (Modified DGI of 11/12 both with and without RW)                                           Pertinent Vitals/Pain Pain Assessment:  (Patient reports pain in L foot with extended ambulation secondary to cellulitis related pain. )    Home Living Family/patient expects to be discharged to:: Group home Living Arrangements: Group Home             Home Equipment: Kasandra Knudsen - single point      Prior Function Level of Independence: Independent with assistive device(s)         Comments: Patient denies any falls stating he typically uses a SPC at home.      Hand Dominance        Extremity/Trunk Assessment  Upper Extremity Assessment: Overall WFL for tasks assessed           Lower Extremity Assessment: Overall WFL for tasks assessed         Communication   Communication: Expressive difficulties  Cognition Arousal/Alertness: Awake/alert Behavior During Therapy: WFL for tasks assessed/performed Overall Cognitive Status: History of cognitive impairments - at baseline (Patient is able to follow all commands and demonstrates appropriate insight into situation. )                      General Comments      Exercises        Assessment/Plan    PT Assessment Patent does not need any further PT services  PT Diagnosis Difficulty walking   PT Problem List    PT Treatment Interventions      PT Goals (Current goals can be found in the Care Plan section) Acute Rehab PT Goals PT Goal Formulation: Patient unable to participate in goal setting Time For Goal Achievement: 05/16/15 Potential to Achieve Goals: Good    Frequency     Barriers to discharge        Co-evaluation               End of Session Equipment Utilized During Treatment: Gait belt Activity Tolerance: Patient tolerated treatment well Patient left: in bed (In wheelchair in hallway as received) Nurse Communication: Mobility status         Time: 1525-1540 PT Time Calculation (min) (ACUTE ONLY): 15 min   Charges:   PT Evaluation $PT Eval Low Complexity: 1 Procedure     PT G Codes:       Kerman Passey, PT, DPT    05/02/2015, 4:32 PM

## 2015-05-02 NOTE — Progress Notes (Signed)
Recreation Therapy Notes  Date: 01.23.17 Time: 3:00 pm Location: Craft Room  Group Topic: Self-expression  Goal Area(s) Addresses:  Patient will effectively use art as a means of self-expression. Patient will recognize positive benefit of self-expression. Patient will be able to identify one emotion experienced during group session. Patient will identify use of art as a coping skill.  Behavioral Response: Arrived late, Inattentive, Left early  Intervention: Two Faces of Me  Activity: Patients were given a blank face worksheet and instructed to draw or write on one half of the face how they were feeling when they were admitted and draw or write on the other half how they want to feel when they are d/c.  Education: LRT educated patients on different forms of self-expression.  Education Outcome: Patient left before LRT educated group.  Clinical Observations/Feedback: Patient arrived to group at approximately 3:25 pm. Patient put his dentures in and appeared to be writing on his hand. Patient left at approximately 3:30 pm when he was called to the nurse's station.  Leonette Monarch, LRT/CTRS 05/02/2015 4:07 PM

## 2015-05-02 NOTE — Plan of Care (Signed)
Problem: [facility] Participation in Recreation Therapeutic Interventions Goal: STG-Other Recreation Therapy Goal (Specify) STG: Stress Management - Within 4 treatment sessions, patient will verbalize understanding of the stress management techniques in each of 2 treatment sessions to increase stress management skills post d/c.  Outcome: Progressing Treatment Session 1; Completed 1 out of 2: At approximately 2:25 pm, LRT met with patient in patient room. LRT educated and provided patient with handouts on stress management techniques. Patient verbalized understanding. LRT encouraged patient to read over and practice the stress management techniques. Intervention Used: Stress Management handouts  Leonette Monarch, LRT/CTRS 01.23.17 2:32 pm
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
## 2015-05-02 NOTE — Progress Notes (Signed)
Patient calm and cooperative during shift. Patient compliant with medications. Medication cream applied to knee and leg exposed from ace bandage on LLE. Patient has changed scrubs several times because he has incontinence or spilling urine on himself while trying to use the urinal. This writer provides assistance with toileting and changing scrubs. Patient states that his legs feel much better and that they are less swollen. Will continue to monitor.

## 2015-05-02 NOTE — Plan of Care (Signed)
Problem: Ineffective individual coping Goal: STG: Patient will remain free from self harm Outcome: Progressing Denies suicidal ideations.     
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
## 2015-05-02 NOTE — Progress Notes (Signed)
Patient is pleasant & cooperative on approach.Denies suicidal or homicidal ideation and AV hallucinations.Visible in the milieu.Move around with wheelchair & without wheelchair at times.Stated that he could not sleep good last night.Compliant with medications.Appropriate with staff & peers.Appetite good.
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
## 2015-05-03 LAB — GLUCOSE, CAPILLARY
GLUCOSE-CAPILLARY: 116 mg/dL — AB (ref 65–99)
GLUCOSE-CAPILLARY: 110 mg/dL — AB (ref 65–99)
Glucose-Capillary: 121 mg/dL — ABNORMAL HIGH (ref 65–99)

## 2015-05-03 LAB — CULTURE, BLOOD (ROUTINE X 2)
CULTURE: NO GROWTH
Culture: NO GROWTH

## 2015-05-03 MED ORDER — LINAGLIPTIN 5 MG PO TABS: 5.0000 mg | ORAL_TABLET | Freq: Every day | ORAL | Status: DC

## 2015-05-03 MED ORDER — CEPHALEXIN 500 MG PO CAPS: 500.0000 mg | ORAL_CAPSULE | Freq: Two times a day (BID) | ORAL | Status: DC

## 2015-05-03 MED ORDER — LITHIUM CARBONATE ER 300 MG PO TBCR: 300.0000 mg | EXTENDED_RELEASE_TABLET | Freq: Two times a day (BID) | ORAL | Status: DC

## 2015-05-03 MED ORDER — DIVALPROEX SODIUM 500 MG PO DR TAB: 500.0000 mg | DELAYED_RELEASE_TABLET | Freq: Two times a day (BID) | ORAL | Status: DC

## 2015-05-03 MED ORDER — LISINOPRIL 5 MG PO TABS: 5.0000 mg | ORAL_TABLET | Freq: Every day | ORAL | Status: DC

## 2015-05-03 MED ORDER — TEMAZEPAM 30 MG PO CAPS: 30.0000 mg | ORAL_CAPSULE | Freq: Every day | ORAL | Status: DC

## 2015-05-03 MED ORDER — TRAMADOL HCL 50 MG PO TABS: 50.0000 mg | ORAL_TABLET | Freq: Two times a day (BID) | ORAL | Status: DC

## 2015-05-03 MED ORDER — MUPIROCIN CALCIUM 2 % EX CREA: TOPICAL_CREAM | Freq: Two times a day (BID) | CUTANEOUS | Status: DC

## 2015-05-03 MED ORDER — ATORVASTATIN CALCIUM 10 MG PO TABS: 10.0000 mg | ORAL_TABLET | Freq: Every day | ORAL | Status: DC

## 2015-05-03 MED ORDER — METOPROLOL TARTRATE 25 MG PO TABS: 12.5000 mg | ORAL_TABLET | Freq: Two times a day (BID) | ORAL | Status: DC

## 2015-05-03 MED ORDER — TIOTROPIUM BROMIDE MONOHYDRATE 18 MCG IN CAPS: 18.0000 ug | ORAL_CAPSULE | Freq: Every day | RESPIRATORY_TRACT | Status: DC

## 2015-05-03 MED ORDER — METOPROLOL TARTRATE 25 MG PO TABS: 12.5000 mg | ORAL_TABLET | Freq: Two times a day (BID) | ORAL | Status: AC

## 2015-05-03 MED ORDER — RISPERIDONE 2 MG PO TABS: 2.0000 mg | ORAL_TABLET | Freq: Two times a day (BID) | ORAL | Status: DC

## 2015-05-03 MED ORDER — ALUM & MAG HYDROXIDE-SIMETH 200-200-20 MG/5ML PO SUSP: 30.0000 mL | ORAL | Status: DC | PRN

## 2015-05-03 MED ORDER — METFORMIN HCL ER (MOD) 1000 MG PO TB24: 1000.0000 mg | ORAL_TABLET | Freq: Two times a day (BID) | ORAL | Status: DC

## 2015-05-03 MED ORDER — ACETAMINOPHEN 500 MG PO TABS: 500.0000 mg | ORAL_TABLET | Freq: Every day | ORAL | Status: DC

## 2015-05-03 MED ORDER — TRIAMCINOLONE ACETONIDE 0.5 % EX OINT
1.0000 "application " | TOPICAL_OINTMENT | Freq: Two times a day (BID) | CUTANEOUS | Status: DC
  Administered 2015-05-03: 1 via TOPICAL
  Filled 2015-05-03: qty 15

## 2015-05-03 MED ORDER — ALBUTEROL SULFATE HFA 108 (90 BASE) MCG/ACT IN AERS: 2.0000 | INHALATION_SPRAY | RESPIRATORY_TRACT | Status: DC | PRN

## 2015-05-03 NOTE — Plan of Care (Signed)
Problem: [facility] Participation in Recreation Therapeutic Interventions Goal: STG-Other Recreation Therapy Goal (Specify) STG: Stress Management - Within 4 treatment sessions, patient will verbalize understanding of the stress management techniques in each of 2 treatment sessions to increase stress management skills post d/c.  Outcome: Completed/Met Date Met:  05/03/15 Treatment Session 2; Completed 2 out of 2: At approximately 2:05 pm, LRT met with patient in hallway. Patient verbalized understanding of the stress management techniques. Intervention Used: Stress Management handouts   M , LRT/CTRS 01.24.17 2:14 pm     

## 2015-05-03 NOTE — Progress Notes (Addendum)
  [facility] Adult Case Management Discharge Plan :  Will you be returning to the same living situation after discharge:  Yes,  pt will be returning to [facility] in [place], [place] At discharge, do you have transportation home?: Yes,  pt will be picked up by [facility] Do you have the ability to pay for your medications: Yes,  pt will be provided precscriptions at discharge  Release of information consent forms completed and in the chart;  Patient's signature needed at discharge.  Patient to Follow up at: Follow-up Information    Follow up with [facility].   Why:  Please bring a copy of your discharge papers and your prescriptions with you in order to be re-admitted   Contact information:   [address] [place], [place] [address] Ph: [phone] Fax: [phone]      Follow up with Dr. Durward Fortes, MD's Internal Medicine clinic.   Why:  Please arrive on Monday January 30th at 9:00am for your hospital follow up with Dr. Durward Fortes, MD.   Contact information:   [address] [place], [place] [address] Ph: [phone] Fax: [phone]      Follow up with [facility] ACT Team.   Why:  Please arrive to to the walk-in clinic on Friday January 27th at 8am for your hospital follow up appointment.   Contact information:   [place], [place] [address] Ph: [place] [phone] Fax: [phone]       Next level of care provider has access to [place] and Suicide Prevention discussed: Yes,  completed with pt  Have you used any form of tobacco in the last 30 days? (Cigarettes, Smokeless Tobacco, Cigars, and/or Pipes): Yes  Has patient been referred to the Quitline?: Patient refused referral  Patient has been referred for addiction treatment: Pt. refused referral  Scott [name] 05/03/2015, 11:58 AM

## 2015-05-03 NOTE — [facility] Group Notes (Signed)
[facility] LCSW Group Therapy  05/03/2015 4:59 PM  Type of Therapy:  Group Therapy  Participation Level:  Did Not Attend   August Saucer, MSW, LCSW 05/03/2015, 4:59 PM
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
## 2015-05-03 NOTE — Progress Notes (Signed)
Patient denies depression & suicidal ideation.Appropriate with staff & peers.Compliant with medications.Attended groups.Infectious Doctor consulted.Patient discharged for tomorrow.

## 2015-05-03 NOTE — BHH Suicide Risk Assessment (Signed)
[facility] Discharge Suicide Risk Assessment   Principal Problem: Bipolar 1 disorder, mixed [facility]) Discharge Diagnoses:  Patient Active Problem List   Diagnosis Date Noted  . Tobacco use disorder [F17.200] 04/28/2015  . Dyslipidemia [E78.5] 04/28/2015  . Essential hypertension [I10] 04/28/2015  . COPD (chronic obstructive pulmonary disease) ([place]) [J44.9] 04/28/2015  . TBI (traumatic brain injury) ([place]) 2009 [place]  [S06.9X9A] 04/28/2015  . Bipolar 1 disorder, mixed ([place]) [F31.60] 04/27/2015  . Diabetes ([place]) [E11.9] 04/27/2015  . Left leg cellulitis [id number] 04/05/2015  . Diverticulosis of colon without hemorrhage [K57.30]   . Fatty liver [K76.0] 01/27/2015  . Gallbladder polyp [K82.4] 01/27/2015    Total Time spent with patient: 30 minutes  Musculoskeletal: Strength & Muscle Tone: within normal limits Gait & Station: normal Patient leans: N/A  Psychiatric Specialty Exam: Review of Systems  Skin:       cellulitis  All other systems reviewed and are negative.   Blood pressure 125/89, pulse 100, temperature 98.3 F (36.8 C), temperature source Oral, resp. rate 18, height 6\' 2"  (1.88 m), weight 90.719 kg (200 lb), SpO2 94 %.Body mass index is 25.67 kg/(m^2).  General Appearance: Casual  Eye Contact::  Good  Speech:  Clear and [id number]  Volume:  Normal  Mood:  Euthymic  Affect:  Appropriate  Thought Process:  Goal Directed  Orientation:  Full (Time, Place, and Person)  Thought Content:  WDL  Suicidal Thoughts:  No  Homicidal Thoughts:  No  Memory:  Immediate;   Fair Recent;   Fair Remote;   Fair  Judgement:  Fair  Insight:  Fair  Psychomotor Activity:  Normal  Concentration:  Fair  Recall:  [employer] [place]  Language: Fair  Akathisia:  No  Handed:  Right  AIMS (if indicated):     Assets:  Communication Skills Desire for Improvement Financial Resources/Insurance Housing Resilience Social Support  Sleep:  Number of Hours: 5.25  Cognition: WNL  ADL's:   Intact   Mental Status Per Nursing Assessment::   On Admission:     Demographic Factors:  Male and Caucasian  Loss Factors: Decline in physical health  Historical Factors: Impulsivity  Risk Reduction Factors:   Sense of responsibility to family, Living with another person, especially a relative, Positive social support and Positive therapeutic relationship  Continued Clinical Symptoms:  Bipolar Disorder:   Depressive phase Chronic Pain  Cognitive Features That Contribute To Risk:  None    Suicide Risk:  Minimal: No identifiable suicidal ideation.  Patients presenting with no risk factors but with morbid ruminations; may be classified as minimal risk based on the severity of the depressive symptoms  Follow-up Information    Follow up with [facility].   Why:  Please bring a copy of your discharge papers and your prescriptions with you in order to be re-admitted   Contact information:   [address] [place], [place] [address] Ph: [phone] Fax: [phone]      Follow up with Dr. Durward Fortes, MD's Internal Medicine clinic.   Why:  Please arrive on Monday January 30th at 9:00am for your hospital follow up with Dr. Durward Fortes, MD.   Contact information:   [address] [place], [place] [address] Ph: [phone] Fax: [phone]      Follow up with [facility] ACT Team.   Why:  Please call Keane Scrape and aftearrive to to the walk-in clinic upon discharge and schedule your assessment your hospital follow up for medication managment  and therapy.  Pleas call Doug Sou to set up your appointment with the doctor ahead of time.   Contact information:   [place], [place] [address] Ph: [place] [phone] [place]      Plan Of Care/Follow-up recommendations:  Activity:  as tolerated. Diet:  low sodium heart healthy. Other:  keep follow up appointments.  Orson Slick, MD 05/03/2015, 3:00 PM
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
## 2015-05-03 NOTE — Progress Notes (Signed)
Recreation Therapy Notes  Date: 01.24.17 Time: 3:00 pm Location: Craft Room  Group Topic: Goal Setting  Goal Area(s) Addresses:  Patient will write at least one goal. Patient will write at least one obstacle.  Behavioral Response: Did not attend  Intervention: Recovery Goal Chart  Activity: Patients were instructed to make a Recovery Goal Chart including goals, obstacles, the date they started working on their goals, and the date they achieved their goal.  Education: LRT educated patients on healthy ways they can celebrate reaching their goals.  Education Outcome: Patient did not attend group.   Clinical Observations/Feedback: Patient did not attend group.  Leonette Monarch, LRT/CTRS 05/03/2015 4:23 PM

## 2015-05-03 NOTE — BHH Group Notes (Signed)
[place] Group Notes:  (Nursing/MHT/Case Management/Adjunct)  Date:  05/03/2015  Time:  1:47 PM  Type of Therapy:  Psychoeducational Skills  Participation Level:  Minimal  Participation Quality:  Drowsy and Inattentive  Affect:  Flat  Cognitive:  Lacking  Insight:  None  Engagement in Group:  Poor  Modes of Intervention:  Discussion, Education and Support  Summary of Progress/Problems:  Scott [name] 05/03/2015, 1:47 PM
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
## 2015-05-04 LAB — GLUCOSE, CAPILLARY: Glucose-Capillary: 119 mg/dL — ABNORMAL HIGH (ref 65–99)

## 2015-05-04 MED ORDER — CEPHALEXIN 500 MG PO CAPS: 500.0000 mg | ORAL_CAPSULE | Freq: Two times a day (BID) | ORAL | Status: DC

## 2015-05-04 MED ORDER — TRIAMCINOLONE ACETONIDE 0.5 % EX OINT: 1.0000 "application " | TOPICAL_OINTMENT | Freq: Two times a day (BID) | CUTANEOUS | Status: DC

## 2015-05-04 NOTE — Progress Notes (Signed)
Pt is pleasant upon approach. Appropriate with staff and peers. Walking and using wheelchair at times. Denies SI, HI and AVH. Seen in milieu and was laughing with staff during med pass. Med compliant. PRN Restoril given at 0037 for sleep; effective. Medications given as prescribed, encouragement and support provided. Q15 minute checks maintained for safety. Will continue to monitor. Pleasant and cooperative with medications and unit routine.  Affect flat but brightens with interactions. Denies Pain, states that his leg is feeling a lot better.

## 2015-05-04 NOTE — BHH Group Notes (Signed)
[facility] LCSW Aftercare Discharge Planning Group Note   05/04/2015 10:56 AM  Participation Quality:  Did not attend   [place] MSW, [place]
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
## 2015-05-04 NOTE — Progress Notes (Signed)
Patient discharged at this time, with transportation provided by group home. Patient acknowledges return of all belongings. Group home staff and patient verbalizes understanding rt recommended discharge plan of care. Safety maintained. Ted hose provided for application.
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
## 2015-05-04 NOTE — BHH Suicide Risk Assessment (Signed)
[facility] Discharge Suicide Risk Assessment   Principal Problem: Bipolar 1 disorder, mixed [facility]) Discharge Diagnoses:  Patient Active Problem List   Diagnosis Date Noted  . Tobacco use disorder [F17.200] 04/28/2015  . Dyslipidemia [E78.5] 04/28/2015  . Essential hypertension [I10] 04/28/2015  . COPD (chronic obstructive pulmonary disease) ([place]) [J44.9] 04/28/2015  . TBI (traumatic brain injury) ([place]) 2009 [place]  [S06.9X9A] 04/28/2015  . Bipolar 1 disorder, mixed ([place]) [F31.60] 04/27/2015  . Diabetes ([place]) [E11.9] 04/27/2015  . Left leg cellulitis [id number] 04/05/2015  . Diverticulosis of colon without hemorrhage [K57.30]   . Fatty liver [K76.0] 01/27/2015  . Gallbladder polyp [K82.4] 01/27/2015    Total Time spent with patient: 30 minutes  Musculoskeletal: Strength & Muscle Tone: within normal limits Gait & Station: normal Patient leans: N/A  Psychiatric Specialty Exam: Review of Systems  Skin:       cellulitis    Blood pressure 118/78, pulse 116, temperature 98.7 F (37.1 C), temperature source Oral, resp. rate 18, height 6\' 2"  (1.88 m), weight 90.719 kg (200 lb), SpO2 94 %.Body mass index is 25.67 kg/(m^2).  General Appearance: Casual  Eye Contact::  Good  Speech:  Clear and [id number]  Volume:  Normal  Mood:  Euthymic  Affect:  Appropriate  Thought Process:  Goal Directed  Orientation:  Full (Time, Place, and Person)  Thought Content:  WDL  Suicidal Thoughts:  No  Homicidal Thoughts:  No  Memory:  Immediate;   Fair Recent;   Fair Remote;   Fair  Judgement:  Fair  Insight:  Fair  Psychomotor Activity:  Normal  Concentration:  Fair  Recall:  [employer] [place]  Language: Fair  Akathisia:  No  Handed:  Right  AIMS (if indicated):     Assets:  Communication Skills Desire for Improvement Financial Resources/Insurance Housing Resilience Social Support  Sleep:  Number of Hours: 4.5  Cognition: WNL  ADL's:  Intact   Mental Status Per Nursing  Assessment::   On Admission:     Demographic Factors:  Male and Caucasian  Loss Factors: NA  Historical Factors: Impulsivity  Risk Reduction Factors:   Sense of responsibility to family, Living with another person, especially a relative, Positive social support and Positive therapeutic relationship  Continued Clinical Symptoms:  Bipolar Disorder:   Depressive phase  Cognitive Features That Contribute To Risk:  None    Suicide Risk:  Minimal: No identifiable suicidal ideation.  Patients presenting with no risk factors but with morbid ruminations; may be classified as minimal risk based on the severity of the depressive symptoms  Follow-up Information    Follow up with [facility].   Why:  Please bring a copy of your discharge papers and your prescriptions with you in order to be re-admitted   Contact information:   [address], [place] [address] Ph: [phone] Fax: [phone]      Follow up with Dr. Durward Fortes, MD's Internal Medicine clinic.   Why:  Please arrive on Monday January 30th at 9:00am for your hospital follow up with Dr. Durward Fortes, MD.   Contact information:   [address] [place], [place] [address] Ph: [phone] Fax: [phone]      Follow up with [facility].   Why:  Please arrive to to the walk-in clinic on Friday January 27th at 8am for your hospital follow up appointment.   Contact information:   [place], [place] [address] Ph: [phone] Fax: [phone]       Plan Of Care/Follow-up recommendations:  Activity:  As tolerated. Diet:  Low sodium heart healthy. Other:  Keep follow-up appointments.  Orson Slick, MD 05/04/2015, 3:46 PM

## 2015-05-04 NOTE — Progress Notes (Signed)
Patient with appropriate affect, cooperative behavior with meals, meds and plan of care. No SI/HI/AVH at this time. Ambulates with slow and steady gait. BLE with redness, dry skin and +2 edema. Patient refuses cream application this am. TEDS hose ordered at discharge and writer ordering from supply chain. Supply chain can not deliver and writer to pick up Hose at time of discharge. Transportation for discharge has arrived. Ambulates with slow steady gait. Safety maintained.

## 2015-05-04 NOTE — Progress Notes (Signed)
Recreation Therapy Notes  INPATIENT RECREATION TR PLAN  Patient Details Name: Scott [name] MRN: [id number] DOB: 1960/08/08 Today's Date: 05/04/2015  Rec Therapy Plan Is patient appropriate for Therapeutic Recreation?: Yes Treatment times per week: At least once a week TR Treatment/Interventions: 1:1 session, Group participation (Comment) (Appropriate participation in daily recreation therapy tx)  Discharge Criteria Pt will be discharged from therapy if:: Treatment goals are met, Discharged Treatment plan/goals/alternatives discussed and agreed upon by:: Patient/family  Discharge Summary Short term goals set: See Care Plan Short term goals met: Complete Progress toward goals comments: One-to-one attended One-to-one attended: Stress Management Reason goals not met: N/A Therapeutic equipment acquired: None Reason patient discharged from therapy: Discharge from hospital Pt/family agrees with progress & goals achieved: Yes Date patient discharged from therapy: 05/04/15   Leonette Monarch, LRT/CTRS 05/04/2015, 1:41 PM

## 2015-05-07 LAB — GLUCOSE, CAPILLARY: Glucose-Capillary: 123 mg/dL — ABNORMAL HIGH (ref 65–99)

## 2016-01-05 ENCOUNTER — Other Ambulatory Visit (HOSPITAL_COMMUNITY): Payer: Self-pay | Admitting: Internal Medicine

## 2016-01-05 DIAGNOSIS — I739 Peripheral vascular disease, unspecified: Secondary | ICD-10-CM

## 2016-01-10 ENCOUNTER — Ambulatory Visit (HOSPITAL_COMMUNITY)
Admission: RE | Admit: 2016-01-10 | Discharge: 2016-01-10 | Disposition: A | Discharge status: Home / Self Care | Payer: Medicare Other | Source: Ambulatory Visit | Attending: Internal Medicine | Admitting: Internal Medicine

## 2016-01-10 DIAGNOSIS — I739 Peripheral vascular disease, unspecified: Secondary | ICD-10-CM | POA: Insufficient documentation

## 2016-01-24 ENCOUNTER — Emergency Department (HOSPITAL_COMMUNITY)
Admission: EM | Admit: 2016-01-24 | Discharge: 2016-01-24 | Disposition: A | Discharge status: Home / Self Care | Payer: Medicare Other | Attending: Emergency Medicine | Admitting: Emergency Medicine

## 2016-01-24 ENCOUNTER — Emergency Department (HOSPITAL_COMMUNITY): Payer: Medicare Other

## 2016-01-24 ENCOUNTER — Encounter (HOSPITAL_COMMUNITY): Payer: Self-pay | Admitting: Emergency Medicine

## 2016-01-24 DIAGNOSIS — J449 Chronic obstructive pulmonary disease, unspecified: Secondary | ICD-10-CM | POA: Diagnosis not present

## 2016-01-24 DIAGNOSIS — Z7984 Long term (current) use of oral hypoglycemic drugs: Secondary | ICD-10-CM | POA: Diagnosis not present

## 2016-01-24 DIAGNOSIS — Z79899 Other long term (current) drug therapy: Secondary | ICD-10-CM | POA: Insufficient documentation

## 2016-01-24 DIAGNOSIS — J0100 Acute maxillary sinusitis, unspecified: Secondary | ICD-10-CM | POA: Insufficient documentation

## 2016-01-24 DIAGNOSIS — F17210 Nicotine dependence, cigarettes, uncomplicated: Secondary | ICD-10-CM | POA: Diagnosis not present

## 2016-01-24 DIAGNOSIS — R220 Localized swelling, mass and lump, head: Secondary | ICD-10-CM

## 2016-01-24 DIAGNOSIS — E119 Type 2 diabetes mellitus without complications: Secondary | ICD-10-CM | POA: Diagnosis not present

## 2016-01-24 DIAGNOSIS — I10 Essential (primary) hypertension: Secondary | ICD-10-CM | POA: Diagnosis not present

## 2016-01-24 MED ORDER — PREDNISONE 10 MG PO TABS
60.0000 mg | ORAL_TABLET | Freq: Once | ORAL | Status: AC
  Administered 2016-01-24: 60 mg via ORAL
  Filled 2016-01-24: qty 1

## 2016-01-24 MED ORDER — ALBUTEROL SULFATE (2.5 MG/3ML) 0.083% IN NEBU
5.0000 mg | INHALATION_SOLUTION | Freq: Once | RESPIRATORY_TRACT | Status: AC
  Administered 2016-01-24: 5 mg via RESPIRATORY_TRACT
  Filled 2016-01-24: qty 6

## 2016-01-24 MED ORDER — LORATADINE 10 MG PO TABS: 10.0000 mg | ORAL_TABLET | Freq: Every day | ORAL | 0 refills | Status: DC

## 2016-01-24 MED ORDER — OXYMETAZOLINE HCL 0.05 % NA SOLN
2.0000 | Freq: Once | NASAL | Status: AC
  Administered 2016-01-24: 2 via NASAL
  Filled 2016-01-24: qty 15

## 2016-01-24 MED ORDER — MOMETASONE FUROATE 50 MCG/ACT NA SUSP: 2.0000 | Freq: Every day | NASAL | 12 refills | Status: DC

## 2016-01-24 MED ORDER — PREDNISONE 20 MG PO TABS: ORAL_TABLET | ORAL | 0 refills | Status: DC

## 2016-01-24 MED ORDER — LORATADINE 10 MG PO TABS
10.0000 mg | ORAL_TABLET | Freq: Every day | ORAL | Status: DC
  Administered 2016-01-24: 10 mg via ORAL
  Filled 2016-01-24: qty 1

## 2016-01-24 NOTE — ED Triage Notes (Signed)
Pt c/o edema to his L face just inferior to his eye, onset today. Pt reports nasal pain.
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
## 2016-01-24 NOTE — ED Notes (Signed)
Called [facility] and gave report to rebecca, rn.  SHe will be here soon to pick up pt.

## 2016-01-24 NOTE — ED Notes (Signed)
Pt made aware to return if symptoms worsen or if any life threatening symptoms occur.   

## 2016-05-03 DIAGNOSIS — J441 Chronic obstructive pulmonary disease with (acute) exacerbation: Secondary | ICD-10-CM | POA: Diagnosis not present

## 2016-06-03 DIAGNOSIS — J449 Chronic obstructive pulmonary disease, unspecified: Secondary | ICD-10-CM | POA: Diagnosis not present

## 2016-06-03 DIAGNOSIS — E1165 Type 2 diabetes mellitus with hyperglycemia: Secondary | ICD-10-CM | POA: Diagnosis not present

## 2016-06-06 DIAGNOSIS — E1140 Type 2 diabetes mellitus with diabetic neuropathy, unspecified: Secondary | ICD-10-CM | POA: Diagnosis not present

## 2016-06-06 DIAGNOSIS — E1142 Type 2 diabetes mellitus with diabetic polyneuropathy: Secondary | ICD-10-CM | POA: Diagnosis not present

## 2016-06-06 DIAGNOSIS — L609 Nail disorder, unspecified: Secondary | ICD-10-CM | POA: Diagnosis not present

## 2016-06-06 DIAGNOSIS — L110 Acquired keratosis follicularis: Secondary | ICD-10-CM | POA: Diagnosis not present

## 2016-07-01 DIAGNOSIS — J449 Chronic obstructive pulmonary disease, unspecified: Secondary | ICD-10-CM | POA: Diagnosis not present

## 2016-07-01 DIAGNOSIS — E1165 Type 2 diabetes mellitus with hyperglycemia: Secondary | ICD-10-CM | POA: Diagnosis not present

## 2016-08-01 DIAGNOSIS — E1165 Type 2 diabetes mellitus with hyperglycemia: Secondary | ICD-10-CM | POA: Diagnosis not present

## 2016-08-01 DIAGNOSIS — J449 Chronic obstructive pulmonary disease, unspecified: Secondary | ICD-10-CM | POA: Diagnosis not present

## 2016-08-10 DIAGNOSIS — E119 Type 2 diabetes mellitus without complications: Secondary | ICD-10-CM | POA: Diagnosis not present

## 2016-08-10 DIAGNOSIS — Z7984 Long term (current) use of oral hypoglycemic drugs: Secondary | ICD-10-CM | POA: Diagnosis not present

## 2016-08-10 DIAGNOSIS — H25813 Combined forms of age-related cataract, bilateral: Secondary | ICD-10-CM | POA: Diagnosis not present

## 2016-08-22 DIAGNOSIS — E1142 Type 2 diabetes mellitus with diabetic polyneuropathy: Secondary | ICD-10-CM | POA: Diagnosis not present

## 2016-08-22 DIAGNOSIS — L609 Nail disorder, unspecified: Secondary | ICD-10-CM | POA: Diagnosis not present

## 2016-08-22 DIAGNOSIS — L110 Acquired keratosis follicularis: Secondary | ICD-10-CM | POA: Diagnosis not present

## 2016-08-22 DIAGNOSIS — E1140 Type 2 diabetes mellitus with diabetic neuropathy, unspecified: Secondary | ICD-10-CM | POA: Diagnosis not present

## 2016-08-31 DIAGNOSIS — E1165 Type 2 diabetes mellitus with hyperglycemia: Secondary | ICD-10-CM | POA: Diagnosis not present

## 2016-08-31 DIAGNOSIS — J449 Chronic obstructive pulmonary disease, unspecified: Secondary | ICD-10-CM | POA: Diagnosis not present

## 2016-09-28 DIAGNOSIS — I10 Essential (primary) hypertension: Secondary | ICD-10-CM | POA: Diagnosis not present

## 2016-09-28 DIAGNOSIS — E1165 Type 2 diabetes mellitus with hyperglycemia: Secondary | ICD-10-CM | POA: Diagnosis not present

## 2016-09-28 DIAGNOSIS — J449 Chronic obstructive pulmonary disease, unspecified: Secondary | ICD-10-CM | POA: Diagnosis not present

## 2016-09-28 DIAGNOSIS — Z1389 Encounter for screening for other disorder: Secondary | ICD-10-CM | POA: Diagnosis not present

## 2016-09-28 DIAGNOSIS — E785 Hyperlipidemia, unspecified: Secondary | ICD-10-CM | POA: Diagnosis not present

## 2016-09-28 DIAGNOSIS — E119 Type 2 diabetes mellitus without complications: Secondary | ICD-10-CM | POA: Diagnosis not present

## 2016-10-28 DIAGNOSIS — E1165 Type 2 diabetes mellitus with hyperglycemia: Secondary | ICD-10-CM | POA: Diagnosis not present

## 2016-10-28 DIAGNOSIS — J449 Chronic obstructive pulmonary disease, unspecified: Secondary | ICD-10-CM | POA: Diagnosis not present

## 2016-11-07 DIAGNOSIS — E1140 Type 2 diabetes mellitus with diabetic neuropathy, unspecified: Secondary | ICD-10-CM | POA: Diagnosis not present

## 2016-11-07 DIAGNOSIS — E1142 Type 2 diabetes mellitus with diabetic polyneuropathy: Secondary | ICD-10-CM | POA: Diagnosis not present

## 2016-11-07 DIAGNOSIS — L110 Acquired keratosis follicularis: Secondary | ICD-10-CM | POA: Diagnosis not present

## 2016-11-07 DIAGNOSIS — L609 Nail disorder, unspecified: Secondary | ICD-10-CM | POA: Diagnosis not present

## 2016-11-28 DIAGNOSIS — E1165 Type 2 diabetes mellitus with hyperglycemia: Secondary | ICD-10-CM | POA: Diagnosis not present

## 2016-11-28 DIAGNOSIS — J449 Chronic obstructive pulmonary disease, unspecified: Secondary | ICD-10-CM | POA: Diagnosis not present

## 2017-01-08 DIAGNOSIS — Z23 Encounter for immunization: Secondary | ICD-10-CM | POA: Diagnosis not present

## 2017-01-10 DIAGNOSIS — E1165 Type 2 diabetes mellitus with hyperglycemia: Secondary | ICD-10-CM | POA: Diagnosis not present

## 2017-01-10 DIAGNOSIS — J449 Chronic obstructive pulmonary disease, unspecified: Secondary | ICD-10-CM | POA: Diagnosis not present

## 2017-01-10 DIAGNOSIS — M549 Dorsalgia, unspecified: Secondary | ICD-10-CM | POA: Diagnosis not present

## 2017-02-06 ENCOUNTER — Encounter (HOSPITAL_COMMUNITY): Payer: Self-pay | Admitting: Emergency Medicine

## 2017-02-06 ENCOUNTER — Emergency Department (HOSPITAL_COMMUNITY)
Admission: EM | Admit: 2017-02-06 | Discharge: 2017-02-06 | Disposition: A | Discharge status: Home / Self Care | Payer: Medicare Other | Attending: Emergency Medicine | Admitting: Emergency Medicine

## 2017-02-06 DIAGNOSIS — F71 Moderate intellectual disabilities: Secondary | ICD-10-CM | POA: Diagnosis not present

## 2017-02-06 DIAGNOSIS — E119 Type 2 diabetes mellitus without complications: Secondary | ICD-10-CM | POA: Diagnosis not present

## 2017-02-06 DIAGNOSIS — Z7984 Long term (current) use of oral hypoglycemic drugs: Secondary | ICD-10-CM | POA: Diagnosis not present

## 2017-02-06 DIAGNOSIS — Z79899 Other long term (current) drug therapy: Secondary | ICD-10-CM | POA: Diagnosis not present

## 2017-02-06 DIAGNOSIS — J449 Chronic obstructive pulmonary disease, unspecified: Secondary | ICD-10-CM | POA: Diagnosis not present

## 2017-02-06 DIAGNOSIS — F17210 Nicotine dependence, cigarettes, uncomplicated: Secondary | ICD-10-CM | POA: Diagnosis not present

## 2017-02-06 DIAGNOSIS — F319 Bipolar disorder, unspecified: Secondary | ICD-10-CM | POA: Insufficient documentation

## 2017-02-06 DIAGNOSIS — R040 Epistaxis: Secondary | ICD-10-CM | POA: Diagnosis not present

## 2017-02-06 LAB — CBC
HCT: 41.6 % (ref 39.0–52.0)
Hemoglobin: 13.4 g/dL (ref 13.0–17.0)
MCH: 31.2 pg (ref 26.0–34.0)
MCHC: 32.2 g/dL (ref 30.0–36.0)
MCV: 97 fL (ref 78.0–100.0)
PLATELETS: 222 10*3/uL (ref 150–400)
RBC: 4.29 MIL/uL (ref 4.22–5.81)
RDW: 14 % (ref 11.5–15.5)
WBC: 10.6 10*3/uL — ABNORMAL HIGH (ref 4.0–10.5)

## 2017-02-06 MED ORDER — AMOXICILLIN-POT CLAVULANATE 875-125 MG PO TABS: 1.0000 | ORAL_TABLET | Freq: Two times a day (BID) | ORAL | 0 refills | Status: DC

## 2017-02-06 MED ORDER — OXYMETAZOLINE HCL 0.05 % NA SOLN
NASAL | Status: AC
  Administered 2017-02-06: 13:00:00
  Filled 2017-02-06: qty 15

## 2017-02-06 NOTE — ED Notes (Signed)
Report called to [facility]. Transportation initiated for Pts return to facility. Pt stable for transportation.

## 2017-02-06 NOTE — Discharge Instructions (Signed)
Take the prescription as directed. Keep the nasal balloon in place for the next 3 days. Call the ENT today to schedule a follow up appointment within the next 3 days to remove the nasal balloon.  Return to the Emergency Department immediately sooner if worsening.
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
## 2017-02-06 NOTE — ED Triage Notes (Signed)
Per [facility] staff, pt has had nosebleed x2 hours. Minimal bleeding noted at this time. Pt denies pain or known injury.

## 2017-02-10 DIAGNOSIS — E1165 Type 2 diabetes mellitus with hyperglycemia: Secondary | ICD-10-CM | POA: Diagnosis not present

## 2017-02-10 DIAGNOSIS — J449 Chronic obstructive pulmonary disease, unspecified: Secondary | ICD-10-CM | POA: Diagnosis not present

## 2017-02-11 ENCOUNTER — Ambulatory Visit (INDEPENDENT_AMBULATORY_CARE_PROVIDER_SITE_OTHER): Payer: Medicare Other | Admitting: Otolaryngology

## 2017-02-11 DIAGNOSIS — R040 Epistaxis: Secondary | ICD-10-CM

## 2017-02-21 DIAGNOSIS — E119 Type 2 diabetes mellitus without complications: Secondary | ICD-10-CM | POA: Diagnosis not present

## 2017-02-21 DIAGNOSIS — H25813 Combined forms of age-related cataract, bilateral: Secondary | ICD-10-CM | POA: Diagnosis not present

## 2017-02-21 DIAGNOSIS — Z7984 Long term (current) use of oral hypoglycemic drugs: Secondary | ICD-10-CM | POA: Diagnosis not present

## 2017-03-28 DIAGNOSIS — J449 Chronic obstructive pulmonary disease, unspecified: Secondary | ICD-10-CM | POA: Diagnosis not present

## 2017-03-28 DIAGNOSIS — E1165 Type 2 diabetes mellitus with hyperglycemia: Secondary | ICD-10-CM | POA: Diagnosis not present

## 2017-04-10 DIAGNOSIS — F310 Bipolar disorder, current episode hypomanic: Secondary | ICD-10-CM | POA: Diagnosis not present

## 2017-04-28 DIAGNOSIS — J449 Chronic obstructive pulmonary disease, unspecified: Secondary | ICD-10-CM | POA: Diagnosis not present

## 2017-04-28 DIAGNOSIS — E1165 Type 2 diabetes mellitus with hyperglycemia: Secondary | ICD-10-CM | POA: Diagnosis not present

## 2017-05-22 DIAGNOSIS — E1142 Type 2 diabetes mellitus with diabetic polyneuropathy: Secondary | ICD-10-CM | POA: Diagnosis not present

## 2017-05-22 DIAGNOSIS — L110 Acquired keratosis follicularis: Secondary | ICD-10-CM | POA: Diagnosis not present

## 2017-05-22 DIAGNOSIS — E1140 Type 2 diabetes mellitus with diabetic neuropathy, unspecified: Secondary | ICD-10-CM | POA: Diagnosis not present

## 2017-05-22 DIAGNOSIS — L609 Nail disorder, unspecified: Secondary | ICD-10-CM | POA: Diagnosis not present

## 2017-05-29 DIAGNOSIS — E1165 Type 2 diabetes mellitus with hyperglycemia: Secondary | ICD-10-CM | POA: Diagnosis not present

## 2017-05-29 DIAGNOSIS — J449 Chronic obstructive pulmonary disease, unspecified: Secondary | ICD-10-CM | POA: Diagnosis not present

## 2017-06-26 DIAGNOSIS — J449 Chronic obstructive pulmonary disease, unspecified: Secondary | ICD-10-CM | POA: Diagnosis not present

## 2017-06-26 DIAGNOSIS — E1165 Type 2 diabetes mellitus with hyperglycemia: Secondary | ICD-10-CM | POA: Diagnosis not present

## 2017-06-26 DIAGNOSIS — F17200 Nicotine dependence, unspecified, uncomplicated: Secondary | ICD-10-CM | POA: Diagnosis not present

## 2017-06-26 DIAGNOSIS — F3163 Bipolar disorder, current episode mixed, severe, without psychotic features: Secondary | ICD-10-CM | POA: Diagnosis not present

## 2017-07-04 DIAGNOSIS — M549 Dorsalgia, unspecified: Secondary | ICD-10-CM | POA: Diagnosis not present

## 2017-07-04 DIAGNOSIS — R300 Dysuria: Secondary | ICD-10-CM | POA: Diagnosis not present

## 2017-07-04 DIAGNOSIS — F3163 Bipolar disorder, current episode mixed, severe, without psychotic features: Secondary | ICD-10-CM | POA: Diagnosis not present

## 2017-07-04 DIAGNOSIS — I10 Essential (primary) hypertension: Secondary | ICD-10-CM | POA: Diagnosis not present

## 2017-07-04 DIAGNOSIS — E1165 Type 2 diabetes mellitus with hyperglycemia: Secondary | ICD-10-CM | POA: Diagnosis not present

## 2017-07-04 DIAGNOSIS — E785 Hyperlipidemia, unspecified: Secondary | ICD-10-CM | POA: Diagnosis not present

## 2017-07-04 DIAGNOSIS — J449 Chronic obstructive pulmonary disease, unspecified: Secondary | ICD-10-CM | POA: Diagnosis not present

## 2017-07-04 DIAGNOSIS — E119 Type 2 diabetes mellitus without complications: Secondary | ICD-10-CM | POA: Diagnosis not present

## 2017-07-31 DIAGNOSIS — F310 Bipolar disorder, current episode hypomanic: Secondary | ICD-10-CM | POA: Diagnosis not present

## 2017-08-04 DIAGNOSIS — J449 Chronic obstructive pulmonary disease, unspecified: Secondary | ICD-10-CM | POA: Diagnosis not present

## 2017-08-04 DIAGNOSIS — E1165 Type 2 diabetes mellitus with hyperglycemia: Secondary | ICD-10-CM | POA: Diagnosis not present

## 2017-08-13 DIAGNOSIS — Z79899 Other long term (current) drug therapy: Secondary | ICD-10-CM | POA: Diagnosis not present

## 2017-08-14 DIAGNOSIS — E1140 Type 2 diabetes mellitus with diabetic neuropathy, unspecified: Secondary | ICD-10-CM | POA: Diagnosis not present

## 2017-08-14 DIAGNOSIS — E1142 Type 2 diabetes mellitus with diabetic polyneuropathy: Secondary | ICD-10-CM | POA: Diagnosis not present

## 2017-08-14 DIAGNOSIS — L110 Acquired keratosis follicularis: Secondary | ICD-10-CM | POA: Diagnosis not present

## 2017-08-14 DIAGNOSIS — L609 Nail disorder, unspecified: Secondary | ICD-10-CM | POA: Diagnosis not present

## 2017-08-29 DIAGNOSIS — E119 Type 2 diabetes mellitus without complications: Secondary | ICD-10-CM | POA: Diagnosis not present

## 2017-08-29 DIAGNOSIS — H25813 Combined forms of age-related cataract, bilateral: Secondary | ICD-10-CM | POA: Diagnosis not present

## 2017-08-29 DIAGNOSIS — Z7984 Long term (current) use of oral hypoglycemic drugs: Secondary | ICD-10-CM | POA: Diagnosis not present

## 2017-10-03 DIAGNOSIS — E785 Hyperlipidemia, unspecified: Secondary | ICD-10-CM | POA: Diagnosis not present

## 2017-10-03 DIAGNOSIS — J449 Chronic obstructive pulmonary disease, unspecified: Secondary | ICD-10-CM | POA: Diagnosis not present

## 2017-10-03 DIAGNOSIS — F17200 Nicotine dependence, unspecified, uncomplicated: Secondary | ICD-10-CM | POA: Diagnosis not present

## 2017-10-03 DIAGNOSIS — F17210 Nicotine dependence, cigarettes, uncomplicated: Secondary | ICD-10-CM | POA: Diagnosis not present

## 2017-10-03 DIAGNOSIS — F3163 Bipolar disorder, current episode mixed, severe, without psychotic features: Secondary | ICD-10-CM | POA: Diagnosis not present

## 2017-10-03 DIAGNOSIS — I10 Essential (primary) hypertension: Secondary | ICD-10-CM | POA: Diagnosis not present

## 2017-10-03 DIAGNOSIS — Z1331 Encounter for screening for depression: Secondary | ICD-10-CM | POA: Diagnosis not present

## 2017-10-03 DIAGNOSIS — R300 Dysuria: Secondary | ICD-10-CM | POA: Diagnosis not present

## 2017-10-03 DIAGNOSIS — E119 Type 2 diabetes mellitus without complications: Secondary | ICD-10-CM | POA: Diagnosis not present

## 2017-10-03 DIAGNOSIS — M549 Dorsalgia, unspecified: Secondary | ICD-10-CM | POA: Diagnosis not present

## 2017-10-03 DIAGNOSIS — Z0000 Encounter for general adult medical examination without abnormal findings: Secondary | ICD-10-CM | POA: Diagnosis not present

## 2017-10-03 DIAGNOSIS — E1165 Type 2 diabetes mellitus with hyperglycemia: Secondary | ICD-10-CM | POA: Diagnosis not present

## 2017-10-03 DIAGNOSIS — Z1389 Encounter for screening for other disorder: Secondary | ICD-10-CM | POA: Diagnosis not present

## 2017-10-30 DIAGNOSIS — E1142 Type 2 diabetes mellitus with diabetic polyneuropathy: Secondary | ICD-10-CM | POA: Diagnosis not present

## 2017-10-30 DIAGNOSIS — E1140 Type 2 diabetes mellitus with diabetic neuropathy, unspecified: Secondary | ICD-10-CM | POA: Diagnosis not present

## 2017-10-30 DIAGNOSIS — L609 Nail disorder, unspecified: Secondary | ICD-10-CM | POA: Diagnosis not present

## 2017-10-30 DIAGNOSIS — L110 Acquired keratosis follicularis: Secondary | ICD-10-CM | POA: Diagnosis not present

## 2017-11-02 DIAGNOSIS — J449 Chronic obstructive pulmonary disease, unspecified: Secondary | ICD-10-CM | POA: Diagnosis not present

## 2017-11-02 DIAGNOSIS — E1165 Type 2 diabetes mellitus with hyperglycemia: Secondary | ICD-10-CM | POA: Diagnosis not present

## 2017-12-03 DIAGNOSIS — J449 Chronic obstructive pulmonary disease, unspecified: Secondary | ICD-10-CM | POA: Diagnosis not present

## 2017-12-03 DIAGNOSIS — E1165 Type 2 diabetes mellitus with hyperglycemia: Secondary | ICD-10-CM | POA: Diagnosis not present

## 2018-01-08 DIAGNOSIS — Z23 Encounter for immunization: Secondary | ICD-10-CM | POA: Diagnosis not present

## 2018-01-08 DIAGNOSIS — F310 Bipolar disorder, current episode hypomanic: Secondary | ICD-10-CM | POA: Diagnosis not present

## 2018-01-15 DIAGNOSIS — L110 Acquired keratosis follicularis: Secondary | ICD-10-CM | POA: Diagnosis not present

## 2018-01-15 DIAGNOSIS — E1140 Type 2 diabetes mellitus with diabetic neuropathy, unspecified: Secondary | ICD-10-CM | POA: Diagnosis not present

## 2018-01-15 DIAGNOSIS — E1142 Type 2 diabetes mellitus with diabetic polyneuropathy: Secondary | ICD-10-CM | POA: Diagnosis not present

## 2018-01-15 DIAGNOSIS — L609 Nail disorder, unspecified: Secondary | ICD-10-CM | POA: Diagnosis not present

## 2018-02-04 DIAGNOSIS — E1165 Type 2 diabetes mellitus with hyperglycemia: Secondary | ICD-10-CM | POA: Diagnosis not present

## 2018-02-04 DIAGNOSIS — F3163 Bipolar disorder, current episode mixed, severe, without psychotic features: Secondary | ICD-10-CM | POA: Diagnosis not present

## 2018-02-04 DIAGNOSIS — R251 Tremor, unspecified: Secondary | ICD-10-CM | POA: Diagnosis not present

## 2018-02-14 DIAGNOSIS — R269 Unspecified abnormalities of gait and mobility: Secondary | ICD-10-CM | POA: Diagnosis not present

## 2018-02-14 DIAGNOSIS — R296 Repeated falls: Secondary | ICD-10-CM | POA: Diagnosis not present

## 2018-02-18 DIAGNOSIS — R269 Unspecified abnormalities of gait and mobility: Secondary | ICD-10-CM | POA: Diagnosis not present

## 2018-02-18 DIAGNOSIS — R296 Repeated falls: Secondary | ICD-10-CM | POA: Diagnosis not present

## 2018-02-20 DIAGNOSIS — F310 Bipolar disorder, current episode hypomanic: Secondary | ICD-10-CM | POA: Diagnosis not present

## 2018-02-21 DIAGNOSIS — R269 Unspecified abnormalities of gait and mobility: Secondary | ICD-10-CM | POA: Diagnosis not present

## 2018-02-21 DIAGNOSIS — R296 Repeated falls: Secondary | ICD-10-CM | POA: Diagnosis not present

## 2018-02-25 DIAGNOSIS — R269 Unspecified abnormalities of gait and mobility: Secondary | ICD-10-CM | POA: Diagnosis not present

## 2018-02-25 DIAGNOSIS — R296 Repeated falls: Secondary | ICD-10-CM | POA: Diagnosis not present

## 2018-02-26 DIAGNOSIS — H25813 Combined forms of age-related cataract, bilateral: Secondary | ICD-10-CM | POA: Diagnosis not present

## 2018-02-26 DIAGNOSIS — Z7984 Long term (current) use of oral hypoglycemic drugs: Secondary | ICD-10-CM | POA: Diagnosis not present

## 2018-02-26 DIAGNOSIS — E119 Type 2 diabetes mellitus without complications: Secondary | ICD-10-CM | POA: Diagnosis not present

## 2018-02-27 DIAGNOSIS — R269 Unspecified abnormalities of gait and mobility: Secondary | ICD-10-CM | POA: Diagnosis not present

## 2018-02-27 DIAGNOSIS — R296 Repeated falls: Secondary | ICD-10-CM | POA: Diagnosis not present

## 2018-03-04 DIAGNOSIS — R296 Repeated falls: Secondary | ICD-10-CM | POA: Diagnosis not present

## 2018-03-04 DIAGNOSIS — R269 Unspecified abnormalities of gait and mobility: Secondary | ICD-10-CM | POA: Diagnosis not present

## 2018-03-07 DIAGNOSIS — J449 Chronic obstructive pulmonary disease, unspecified: Secondary | ICD-10-CM | POA: Diagnosis not present

## 2018-03-07 DIAGNOSIS — E1165 Type 2 diabetes mellitus with hyperglycemia: Secondary | ICD-10-CM | POA: Diagnosis not present

## 2018-03-07 DIAGNOSIS — R296 Repeated falls: Secondary | ICD-10-CM | POA: Diagnosis not present

## 2018-03-07 DIAGNOSIS — R269 Unspecified abnormalities of gait and mobility: Secondary | ICD-10-CM | POA: Diagnosis not present

## 2018-03-11 DIAGNOSIS — R269 Unspecified abnormalities of gait and mobility: Secondary | ICD-10-CM | POA: Diagnosis not present

## 2018-03-11 DIAGNOSIS — R296 Repeated falls: Secondary | ICD-10-CM | POA: Diagnosis not present

## 2018-03-13 DIAGNOSIS — R296 Repeated falls: Secondary | ICD-10-CM | POA: Diagnosis not present

## 2018-03-13 DIAGNOSIS — R269 Unspecified abnormalities of gait and mobility: Secondary | ICD-10-CM | POA: Diagnosis not present

## 2018-03-15 DIAGNOSIS — H524 Presbyopia: Secondary | ICD-10-CM | POA: Diagnosis not present

## 2018-03-15 DIAGNOSIS — H5213 Myopia, bilateral: Secondary | ICD-10-CM | POA: Diagnosis not present

## 2018-03-18 DIAGNOSIS — R296 Repeated falls: Secondary | ICD-10-CM | POA: Diagnosis not present

## 2018-03-18 DIAGNOSIS — R269 Unspecified abnormalities of gait and mobility: Secondary | ICD-10-CM | POA: Diagnosis not present

## 2018-03-20 DIAGNOSIS — R269 Unspecified abnormalities of gait and mobility: Secondary | ICD-10-CM | POA: Diagnosis not present

## 2018-03-20 DIAGNOSIS — R296 Repeated falls: Secondary | ICD-10-CM | POA: Diagnosis not present

## 2018-03-25 DIAGNOSIS — R269 Unspecified abnormalities of gait and mobility: Secondary | ICD-10-CM | POA: Diagnosis not present

## 2018-03-25 DIAGNOSIS — R296 Repeated falls: Secondary | ICD-10-CM | POA: Diagnosis not present

## 2018-03-26 DIAGNOSIS — E1140 Type 2 diabetes mellitus with diabetic neuropathy, unspecified: Secondary | ICD-10-CM | POA: Diagnosis not present

## 2018-03-26 DIAGNOSIS — L609 Nail disorder, unspecified: Secondary | ICD-10-CM | POA: Diagnosis not present

## 2018-03-26 DIAGNOSIS — L110 Acquired keratosis follicularis: Secondary | ICD-10-CM | POA: Diagnosis not present

## 2018-03-26 DIAGNOSIS — E1142 Type 2 diabetes mellitus with diabetic polyneuropathy: Secondary | ICD-10-CM | POA: Diagnosis not present

## 2018-03-27 DIAGNOSIS — R269 Unspecified abnormalities of gait and mobility: Secondary | ICD-10-CM | POA: Diagnosis not present

## 2018-03-27 DIAGNOSIS — R296 Repeated falls: Secondary | ICD-10-CM | POA: Diagnosis not present

## 2018-03-31 DIAGNOSIS — R269 Unspecified abnormalities of gait and mobility: Secondary | ICD-10-CM | POA: Diagnosis not present

## 2018-03-31 DIAGNOSIS — R296 Repeated falls: Secondary | ICD-10-CM | POA: Diagnosis not present

## 2018-04-03 DIAGNOSIS — R269 Unspecified abnormalities of gait and mobility: Secondary | ICD-10-CM | POA: Diagnosis not present

## 2018-04-03 DIAGNOSIS — R296 Repeated falls: Secondary | ICD-10-CM | POA: Diagnosis not present

## 2018-04-08 DIAGNOSIS — I10 Essential (primary) hypertension: Secondary | ICD-10-CM | POA: Diagnosis not present

## 2018-04-08 DIAGNOSIS — R251 Tremor, unspecified: Secondary | ICD-10-CM | POA: Diagnosis not present

## 2018-04-08 DIAGNOSIS — F3163 Bipolar disorder, current episode mixed, severe, without psychotic features: Secondary | ICD-10-CM | POA: Diagnosis not present

## 2018-04-08 DIAGNOSIS — E1165 Type 2 diabetes mellitus with hyperglycemia: Secondary | ICD-10-CM | POA: Diagnosis not present

## 2018-04-21 DIAGNOSIS — Z79899 Other long term (current) drug therapy: Secondary | ICD-10-CM | POA: Diagnosis not present

## 2018-04-21 DIAGNOSIS — G20 Parkinson's disease: Secondary | ICD-10-CM | POA: Diagnosis not present

## 2018-04-21 DIAGNOSIS — F315 Bipolar disorder, current episode depressed, severe, with psychotic features: Secondary | ICD-10-CM | POA: Diagnosis not present

## 2018-04-21 DIAGNOSIS — R430 Anosmia: Secondary | ICD-10-CM | POA: Diagnosis not present

## 2018-04-29 DIAGNOSIS — R296 Repeated falls: Secondary | ICD-10-CM | POA: Diagnosis not present

## 2018-04-29 DIAGNOSIS — R269 Unspecified abnormalities of gait and mobility: Secondary | ICD-10-CM | POA: Diagnosis not present

## 2018-05-06 DIAGNOSIS — R296 Repeated falls: Secondary | ICD-10-CM | POA: Diagnosis not present

## 2018-05-06 DIAGNOSIS — R269 Unspecified abnormalities of gait and mobility: Secondary | ICD-10-CM | POA: Diagnosis not present

## 2018-05-08 DIAGNOSIS — R269 Unspecified abnormalities of gait and mobility: Secondary | ICD-10-CM | POA: Diagnosis not present

## 2018-05-08 DIAGNOSIS — R296 Repeated falls: Secondary | ICD-10-CM | POA: Diagnosis not present

## 2018-05-13 DIAGNOSIS — R296 Repeated falls: Secondary | ICD-10-CM | POA: Diagnosis not present

## 2018-05-13 DIAGNOSIS — R269 Unspecified abnormalities of gait and mobility: Secondary | ICD-10-CM | POA: Diagnosis not present

## 2018-05-15 DIAGNOSIS — R269 Unspecified abnormalities of gait and mobility: Secondary | ICD-10-CM | POA: Diagnosis not present

## 2018-05-15 DIAGNOSIS — R296 Repeated falls: Secondary | ICD-10-CM | POA: Diagnosis not present

## 2018-05-17 DIAGNOSIS — J449 Chronic obstructive pulmonary disease, unspecified: Secondary | ICD-10-CM | POA: Diagnosis not present

## 2018-05-17 DIAGNOSIS — E1165 Type 2 diabetes mellitus with hyperglycemia: Secondary | ICD-10-CM | POA: Diagnosis not present

## 2018-05-20 DIAGNOSIS — R269 Unspecified abnormalities of gait and mobility: Secondary | ICD-10-CM | POA: Diagnosis not present

## 2018-05-20 DIAGNOSIS — R296 Repeated falls: Secondary | ICD-10-CM | POA: Diagnosis not present

## 2018-05-22 DIAGNOSIS — R296 Repeated falls: Secondary | ICD-10-CM | POA: Diagnosis not present

## 2018-05-22 DIAGNOSIS — R269 Unspecified abnormalities of gait and mobility: Secondary | ICD-10-CM | POA: Diagnosis not present

## 2018-05-27 DIAGNOSIS — R269 Unspecified abnormalities of gait and mobility: Secondary | ICD-10-CM | POA: Diagnosis not present

## 2018-05-27 DIAGNOSIS — R296 Repeated falls: Secondary | ICD-10-CM | POA: Diagnosis not present

## 2018-05-29 DIAGNOSIS — R296 Repeated falls: Secondary | ICD-10-CM | POA: Diagnosis not present

## 2018-05-29 DIAGNOSIS — R269 Unspecified abnormalities of gait and mobility: Secondary | ICD-10-CM | POA: Diagnosis not present

## 2018-06-03 DIAGNOSIS — F315 Bipolar disorder, current episode depressed, severe, with psychotic features: Secondary | ICD-10-CM | POA: Diagnosis not present

## 2018-06-03 DIAGNOSIS — R430 Anosmia: Secondary | ICD-10-CM | POA: Diagnosis not present

## 2018-06-03 DIAGNOSIS — G20 Parkinson's disease: Secondary | ICD-10-CM | POA: Diagnosis not present

## 2018-06-03 DIAGNOSIS — Z79899 Other long term (current) drug therapy: Secondary | ICD-10-CM | POA: Diagnosis not present

## 2018-06-03 DIAGNOSIS — R269 Unspecified abnormalities of gait and mobility: Secondary | ICD-10-CM | POA: Diagnosis not present

## 2018-06-03 DIAGNOSIS — R296 Repeated falls: Secondary | ICD-10-CM | POA: Diagnosis not present

## 2018-06-05 DIAGNOSIS — R269 Unspecified abnormalities of gait and mobility: Secondary | ICD-10-CM | POA: Diagnosis not present

## 2018-06-05 DIAGNOSIS — R296 Repeated falls: Secondary | ICD-10-CM | POA: Diagnosis not present

## 2018-06-05 DIAGNOSIS — F310 Bipolar disorder, current episode hypomanic: Secondary | ICD-10-CM | POA: Diagnosis not present

## 2018-06-10 DIAGNOSIS — R269 Unspecified abnormalities of gait and mobility: Secondary | ICD-10-CM | POA: Diagnosis not present

## 2018-06-10 DIAGNOSIS — R296 Repeated falls: Secondary | ICD-10-CM | POA: Diagnosis not present

## 2018-06-12 DIAGNOSIS — R269 Unspecified abnormalities of gait and mobility: Secondary | ICD-10-CM | POA: Diagnosis not present

## 2018-06-12 DIAGNOSIS — R296 Repeated falls: Secondary | ICD-10-CM | POA: Diagnosis not present

## 2018-06-15 DIAGNOSIS — E1165 Type 2 diabetes mellitus with hyperglycemia: Secondary | ICD-10-CM | POA: Diagnosis not present

## 2018-06-15 DIAGNOSIS — J449 Chronic obstructive pulmonary disease, unspecified: Secondary | ICD-10-CM | POA: Diagnosis not present

## 2018-06-17 DIAGNOSIS — L609 Nail disorder, unspecified: Secondary | ICD-10-CM | POA: Diagnosis not present

## 2018-06-17 DIAGNOSIS — L110 Acquired keratosis follicularis: Secondary | ICD-10-CM | POA: Diagnosis not present

## 2018-06-17 DIAGNOSIS — E1142 Type 2 diabetes mellitus with diabetic polyneuropathy: Secondary | ICD-10-CM | POA: Diagnosis not present

## 2018-06-17 DIAGNOSIS — E1140 Type 2 diabetes mellitus with diabetic neuropathy, unspecified: Secondary | ICD-10-CM | POA: Diagnosis not present

## 2018-06-19 DIAGNOSIS — F17200 Nicotine dependence, unspecified, uncomplicated: Secondary | ICD-10-CM | POA: Diagnosis not present

## 2018-06-19 DIAGNOSIS — F17210 Nicotine dependence, cigarettes, uncomplicated: Secondary | ICD-10-CM | POA: Diagnosis not present

## 2018-06-19 DIAGNOSIS — J441 Chronic obstructive pulmonary disease with (acute) exacerbation: Secondary | ICD-10-CM | POA: Diagnosis not present

## 2018-06-19 DIAGNOSIS — E1165 Type 2 diabetes mellitus with hyperglycemia: Secondary | ICD-10-CM | POA: Diagnosis not present

## 2018-06-19 DIAGNOSIS — F3163 Bipolar disorder, current episode mixed, severe, without psychotic features: Secondary | ICD-10-CM | POA: Diagnosis not present

## 2018-07-11 DIAGNOSIS — J441 Chronic obstructive pulmonary disease with (acute) exacerbation: Secondary | ICD-10-CM | POA: Diagnosis not present

## 2018-08-10 DIAGNOSIS — J449 Chronic obstructive pulmonary disease, unspecified: Secondary | ICD-10-CM | POA: Diagnosis not present

## 2018-08-10 DIAGNOSIS — E1165 Type 2 diabetes mellitus with hyperglycemia: Secondary | ICD-10-CM | POA: Diagnosis not present

## 2018-09-02 DIAGNOSIS — L609 Nail disorder, unspecified: Secondary | ICD-10-CM | POA: Diagnosis not present

## 2018-09-02 DIAGNOSIS — E1140 Type 2 diabetes mellitus with diabetic neuropathy, unspecified: Secondary | ICD-10-CM | POA: Diagnosis not present

## 2018-09-02 DIAGNOSIS — E1142 Type 2 diabetes mellitus with diabetic polyneuropathy: Secondary | ICD-10-CM | POA: Diagnosis not present

## 2018-09-02 DIAGNOSIS — L110 Acquired keratosis follicularis: Secondary | ICD-10-CM | POA: Diagnosis not present

## 2018-10-02 DIAGNOSIS — F3163 Bipolar disorder, current episode mixed, severe, without psychotic features: Secondary | ICD-10-CM | POA: Diagnosis not present

## 2018-10-02 DIAGNOSIS — I739 Peripheral vascular disease, unspecified: Secondary | ICD-10-CM | POA: Diagnosis not present

## 2018-10-02 DIAGNOSIS — E1165 Type 2 diabetes mellitus with hyperglycemia: Secondary | ICD-10-CM | POA: Diagnosis not present

## 2018-10-02 DIAGNOSIS — J449 Chronic obstructive pulmonary disease, unspecified: Secondary | ICD-10-CM | POA: Diagnosis not present

## 2018-10-03 DIAGNOSIS — Z0001 Encounter for general adult medical examination with abnormal findings: Secondary | ICD-10-CM | POA: Diagnosis not present

## 2018-10-03 DIAGNOSIS — F3163 Bipolar disorder, current episode mixed, severe, without psychotic features: Secondary | ICD-10-CM | POA: Diagnosis not present

## 2018-10-03 DIAGNOSIS — E1165 Type 2 diabetes mellitus with hyperglycemia: Secondary | ICD-10-CM | POA: Diagnosis not present

## 2018-10-03 DIAGNOSIS — I739 Peripheral vascular disease, unspecified: Secondary | ICD-10-CM | POA: Diagnosis not present

## 2018-10-03 DIAGNOSIS — J449 Chronic obstructive pulmonary disease, unspecified: Secondary | ICD-10-CM | POA: Diagnosis not present

## 2018-10-24 DIAGNOSIS — Z7984 Long term (current) use of oral hypoglycemic drugs: Secondary | ICD-10-CM | POA: Diagnosis not present

## 2018-10-24 DIAGNOSIS — E119 Type 2 diabetes mellitus without complications: Secondary | ICD-10-CM | POA: Diagnosis not present

## 2018-10-24 DIAGNOSIS — H25813 Combined forms of age-related cataract, bilateral: Secondary | ICD-10-CM | POA: Diagnosis not present

## 2018-11-01 DIAGNOSIS — J449 Chronic obstructive pulmonary disease, unspecified: Secondary | ICD-10-CM | POA: Diagnosis not present

## 2018-11-01 DIAGNOSIS — E1165 Type 2 diabetes mellitus with hyperglycemia: Secondary | ICD-10-CM | POA: Diagnosis not present

## 2018-11-05 DIAGNOSIS — F310 Bipolar disorder, current episode hypomanic: Secondary | ICD-10-CM | POA: Diagnosis not present

## 2018-11-18 DIAGNOSIS — L609 Nail disorder, unspecified: Secondary | ICD-10-CM | POA: Diagnosis not present

## 2018-11-18 DIAGNOSIS — E1140 Type 2 diabetes mellitus with diabetic neuropathy, unspecified: Secondary | ICD-10-CM | POA: Diagnosis not present

## 2018-11-18 DIAGNOSIS — L110 Acquired keratosis follicularis: Secondary | ICD-10-CM | POA: Diagnosis not present

## 2018-11-18 DIAGNOSIS — E1142 Type 2 diabetes mellitus with diabetic polyneuropathy: Secondary | ICD-10-CM | POA: Diagnosis not present

## 2018-11-19 ENCOUNTER — Other Ambulatory Visit: Payer: Self-pay

## 2018-11-19 ENCOUNTER — Ambulatory Visit (INDEPENDENT_AMBULATORY_CARE_PROVIDER_SITE_OTHER): Payer: Medicare Other | Admitting: "Endocrinology

## 2018-11-19 ENCOUNTER — Encounter: Payer: Self-pay | Admitting: "Endocrinology

## 2018-11-19 VITALS — BP 125/86 | HR 75 | Ht 69.0 in | Wt 212.0 lb

## 2018-11-19 DIAGNOSIS — E1165 Type 2 diabetes mellitus with hyperglycemia: Secondary | ICD-10-CM | POA: Diagnosis not present

## 2018-11-19 DIAGNOSIS — I10 Essential (primary) hypertension: Secondary | ICD-10-CM | POA: Diagnosis not present

## 2018-11-19 LAB — HEMOGLOBIN A1C: Hemoglobin A1C: 9.4

## 2018-11-19 MED ORDER — LANTUS SOLOSTAR 100 UNIT/ML ~~LOC~~ SOPN: 30.0000 [IU] | PEN_INJECTOR | Freq: Every day | SUBCUTANEOUS | 2 refills | Status: DC
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
## 2018-11-19 NOTE — Patient Instructions (Signed)

## 2018-11-27 DIAGNOSIS — J449 Chronic obstructive pulmonary disease, unspecified: Secondary | ICD-10-CM | POA: Diagnosis not present

## 2018-11-27 DIAGNOSIS — E1165 Type 2 diabetes mellitus with hyperglycemia: Secondary | ICD-10-CM | POA: Diagnosis not present

## 2018-12-03 ENCOUNTER — Encounter: Payer: Self-pay | Admitting: "Endocrinology

## 2018-12-03 ENCOUNTER — Other Ambulatory Visit: Payer: Self-pay

## 2018-12-03 ENCOUNTER — Ambulatory Visit (INDEPENDENT_AMBULATORY_CARE_PROVIDER_SITE_OTHER): Payer: Medicare Other | Admitting: "Endocrinology

## 2018-12-03 DIAGNOSIS — E1165 Type 2 diabetes mellitus with hyperglycemia: Secondary | ICD-10-CM | POA: Diagnosis not present

## 2018-12-03 DIAGNOSIS — I10 Essential (primary) hypertension: Secondary | ICD-10-CM

## 2018-12-03 MED ORDER — LANTUS SOLOSTAR 100 UNIT/ML ~~LOC~~ SOPN: 40.0000 [IU] | PEN_INJECTOR | Freq: Every day | SUBCUTANEOUS | 2 refills | Status: DC
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
## 2018-12-04 ENCOUNTER — Ambulatory Visit: Payer: Medicare Other | Admitting: "Endocrinology

## 2018-12-13 DIAGNOSIS — E1165 Type 2 diabetes mellitus with hyperglycemia: Secondary | ICD-10-CM | POA: Diagnosis not present

## 2018-12-13 DIAGNOSIS — J449 Chronic obstructive pulmonary disease, unspecified: Secondary | ICD-10-CM | POA: Diagnosis not present

## 2019-01-12 DIAGNOSIS — M549 Dorsalgia, unspecified: Secondary | ICD-10-CM | POA: Diagnosis not present

## 2019-01-12 DIAGNOSIS — Z23 Encounter for immunization: Secondary | ICD-10-CM | POA: Diagnosis not present

## 2019-01-12 DIAGNOSIS — E1165 Type 2 diabetes mellitus with hyperglycemia: Secondary | ICD-10-CM | POA: Diagnosis not present

## 2019-01-20 DIAGNOSIS — Z79899 Other long term (current) drug therapy: Secondary | ICD-10-CM | POA: Diagnosis not present

## 2019-01-26 DIAGNOSIS — R269 Unspecified abnormalities of gait and mobility: Secondary | ICD-10-CM | POA: Diagnosis not present

## 2019-01-26 DIAGNOSIS — R296 Repeated falls: Secondary | ICD-10-CM | POA: Diagnosis not present

## 2019-01-28 DIAGNOSIS — R296 Repeated falls: Secondary | ICD-10-CM | POA: Diagnosis not present

## 2019-01-28 DIAGNOSIS — R269 Unspecified abnormalities of gait and mobility: Secondary | ICD-10-CM | POA: Diagnosis not present

## 2019-02-02 DIAGNOSIS — R269 Unspecified abnormalities of gait and mobility: Secondary | ICD-10-CM | POA: Diagnosis not present

## 2019-02-02 DIAGNOSIS — R296 Repeated falls: Secondary | ICD-10-CM | POA: Diagnosis not present

## 2019-02-03 DIAGNOSIS — L110 Acquired keratosis follicularis: Secondary | ICD-10-CM | POA: Diagnosis not present

## 2019-02-03 DIAGNOSIS — L609 Nail disorder, unspecified: Secondary | ICD-10-CM | POA: Diagnosis not present

## 2019-02-03 DIAGNOSIS — E1142 Type 2 diabetes mellitus with diabetic polyneuropathy: Secondary | ICD-10-CM | POA: Diagnosis not present

## 2019-02-03 DIAGNOSIS — E1140 Type 2 diabetes mellitus with diabetic neuropathy, unspecified: Secondary | ICD-10-CM | POA: Diagnosis not present

## 2019-02-04 DIAGNOSIS — R269 Unspecified abnormalities of gait and mobility: Secondary | ICD-10-CM | POA: Diagnosis not present

## 2019-02-04 DIAGNOSIS — R296 Repeated falls: Secondary | ICD-10-CM | POA: Diagnosis not present

## 2019-02-09 DIAGNOSIS — R269 Unspecified abnormalities of gait and mobility: Secondary | ICD-10-CM | POA: Diagnosis not present

## 2019-02-09 DIAGNOSIS — R296 Repeated falls: Secondary | ICD-10-CM | POA: Diagnosis not present

## 2019-02-11 DIAGNOSIS — R269 Unspecified abnormalities of gait and mobility: Secondary | ICD-10-CM | POA: Diagnosis not present

## 2019-02-11 DIAGNOSIS — R296 Repeated falls: Secondary | ICD-10-CM | POA: Diagnosis not present

## 2019-02-17 DIAGNOSIS — R269 Unspecified abnormalities of gait and mobility: Secondary | ICD-10-CM | POA: Diagnosis not present

## 2019-02-17 DIAGNOSIS — R296 Repeated falls: Secondary | ICD-10-CM | POA: Diagnosis not present

## 2019-02-19 DIAGNOSIS — R269 Unspecified abnormalities of gait and mobility: Secondary | ICD-10-CM | POA: Diagnosis not present

## 2019-02-19 DIAGNOSIS — R296 Repeated falls: Secondary | ICD-10-CM | POA: Diagnosis not present

## 2019-03-09 ENCOUNTER — Emergency Department (HOSPITAL_COMMUNITY): Payer: Medicare Other

## 2019-03-09 ENCOUNTER — Inpatient Hospital Stay (HOSPITAL_COMMUNITY)
Admission: EM | Admit: 2019-03-09 | Discharge: 2019-03-13 | DRG: 177 | Disposition: A | Discharge status: Nursing Home (Includes ICF) | Payer: Medicare Other | Attending: Internal Medicine | Admitting: Internal Medicine

## 2019-03-09 ENCOUNTER — Other Ambulatory Visit: Payer: Self-pay

## 2019-03-09 ENCOUNTER — Encounter (HOSPITAL_COMMUNITY): Payer: Self-pay | Admitting: Emergency Medicine

## 2019-03-09 DIAGNOSIS — J449 Chronic obstructive pulmonary disease, unspecified: Secondary | ICD-10-CM | POA: Diagnosis not present

## 2019-03-09 DIAGNOSIS — E1165 Type 2 diabetes mellitus with hyperglycemia: Secondary | ICD-10-CM | POA: Diagnosis present

## 2019-03-09 DIAGNOSIS — Z79899 Other long term (current) drug therapy: Secondary | ICD-10-CM | POA: Diagnosis not present

## 2019-03-09 DIAGNOSIS — Z794 Long term (current) use of insulin: Secondary | ICD-10-CM | POA: Diagnosis not present

## 2019-03-09 DIAGNOSIS — Z79891 Long term (current) use of opiate analgesic: Secondary | ICD-10-CM

## 2019-03-09 DIAGNOSIS — J1289 Other viral pneumonia: Secondary | ICD-10-CM | POA: Diagnosis present

## 2019-03-09 DIAGNOSIS — I10 Essential (primary) hypertension: Secondary | ICD-10-CM | POA: Diagnosis present

## 2019-03-09 DIAGNOSIS — F17200 Nicotine dependence, unspecified, uncomplicated: Secondary | ICD-10-CM | POA: Diagnosis present

## 2019-03-09 DIAGNOSIS — E785 Hyperlipidemia, unspecified: Secondary | ICD-10-CM | POA: Diagnosis present

## 2019-03-09 DIAGNOSIS — R05 Cough: Secondary | ICD-10-CM | POA: Diagnosis not present

## 2019-03-09 DIAGNOSIS — R0602 Shortness of breath: Secondary | ICD-10-CM | POA: Diagnosis not present

## 2019-03-09 DIAGNOSIS — Z87820 Personal history of traumatic brain injury: Secondary | ICD-10-CM | POA: Diagnosis not present

## 2019-03-09 DIAGNOSIS — S069X9A Unspecified intracranial injury with loss of consciousness of unspecified duration, initial encounter: Secondary | ICD-10-CM | POA: Diagnosis present

## 2019-03-09 DIAGNOSIS — J9601 Acute respiratory failure with hypoxia: Secondary | ICD-10-CM | POA: Diagnosis present

## 2019-03-09 DIAGNOSIS — K760 Fatty (change of) liver, not elsewhere classified: Secondary | ICD-10-CM | POA: Diagnosis not present

## 2019-03-09 DIAGNOSIS — F17210 Nicotine dependence, cigarettes, uncomplicated: Secondary | ICD-10-CM | POA: Diagnosis present

## 2019-03-09 DIAGNOSIS — F3160 Bipolar disorder, current episode mixed, unspecified: Secondary | ICD-10-CM | POA: Diagnosis present

## 2019-03-09 DIAGNOSIS — F79 Unspecified intellectual disabilities: Secondary | ICD-10-CM | POA: Diagnosis not present

## 2019-03-09 DIAGNOSIS — E875 Hyperkalemia: Secondary | ICD-10-CM | POA: Diagnosis present

## 2019-03-09 DIAGNOSIS — U071 COVID-19: Secondary | ICD-10-CM | POA: Diagnosis not present

## 2019-03-09 DIAGNOSIS — B349 Viral infection, unspecified: Secondary | ICD-10-CM | POA: Diagnosis present

## 2019-03-09 DIAGNOSIS — R509 Fever, unspecified: Secondary | ICD-10-CM | POA: Diagnosis not present

## 2019-03-09 DIAGNOSIS — S069XAA Unspecified intracranial injury with loss of consciousness status unknown, initial encounter: Secondary | ICD-10-CM | POA: Diagnosis present

## 2019-03-09 LAB — COMPREHENSIVE METABOLIC PANEL
ALT: 13 U/L (ref 0–44)
ALT: 13 U/L (ref 0–44)
AST: 27 U/L (ref 15–41)
AST: 27 U/L (ref 15–41)
Albumin: 3.2 g/dL — ABNORMAL LOW (ref 3.5–5.0)
Albumin: 3.1 g/dL — ABNORMAL LOW (ref 3.5–5.0)
Alkaline Phosphatase: 98 U/L (ref 38–126)
Alkaline Phosphatase: 91 U/L (ref 38–126)
Anion gap: 11 (ref 5–15)
Anion gap: 11 (ref 5–15)
BUN: 17 mg/dL (ref 6–20)
BUN: 17 mg/dL (ref 6–20)
CO2: 17 mmol/L — ABNORMAL LOW (ref 22–32)
CO2: 18 mmol/L — ABNORMAL LOW (ref 22–32)
Calcium: 8.9 mg/dL (ref 8.9–10.3)
Calcium: 8.9 mg/dL (ref 8.9–10.3)
Chloride: 108 mmol/L (ref 98–111)
Chloride: 108 mmol/L (ref 98–111)
Creatinine, Ser: 1.1 mg/dL (ref 0.61–1.24)
Creatinine, Ser: 1.02 mg/dL (ref 0.61–1.24)
GFR calc Af Amer: >60 mL/min (ref >60)
GFR calc Af Amer: >60 mL/min (ref >60)
GFR calc non Af Amer: >60 mL/min (ref >60)
GFR calc non Af Amer: >60 mL/min (ref >60)
Glucose, Bld: 129 mg/dL — ABNORMAL HIGH (ref 70–99)
Glucose, Bld: 119 mg/dL — ABNORMAL HIGH (ref 70–99)
Potassium: 4.6 mmol/L (ref 3.5–5.1)
Potassium: 4.7 mmol/L (ref 3.5–5.1)
Sodium: 136 mmol/L (ref 135–145)
Sodium: 137 mmol/L (ref 135–145)
Total Bilirubin: 0.5 mg/dL (ref 0.3–1.2)
Total Bilirubin: 0.4 mg/dL (ref 0.3–1.2)
Total Protein: 7.4 g/dL (ref 6.5–8.1)
Total Protein: 7.2 g/dL (ref 6.5–8.1)

## 2019-03-09 LAB — GLUCOSE, CAPILLARY
Glucose-Capillary: 319 mg/dL — ABNORMAL HIGH (ref 70–99)
Glucose-Capillary: 415 mg/dL — ABNORMAL HIGH (ref 70–99)

## 2019-03-09 LAB — CBC WITH DIFFERENTIAL/PLATELET
Abs Immature Granulocytes: 0.03 10*3/uL (ref 0.00–0.07)
Basophils Absolute: 0 10*3/uL (ref 0.0–0.1)
Basophils Relative: 0 %
Eosinophils Absolute: 0 10*3/uL (ref 0.0–0.5)
Eosinophils Relative: 0 %
HCT: 38.1 % — ABNORMAL LOW (ref 39.0–52.0)
Hemoglobin: 12.1 g/dL — ABNORMAL LOW (ref 13.0–17.0)
Immature Granulocytes: 1 %
Lymphocytes Relative: 29 %
Lymphs Abs: 1.6 10*3/uL (ref 0.7–4.0)
MCH: 30.9 pg (ref 26.0–34.0)
MCHC: 31.8 g/dL (ref 30.0–36.0)
MCV: 97.4 fL (ref 80.0–100.0)
Monocytes Absolute: 0.6 10*3/uL (ref 0.1–1.0)
Monocytes Relative: 11 %
Neutro Abs: 3.3 10*3/uL (ref 1.7–7.7)
Neutrophils Relative %: 59 %
Platelets: 227 10*3/uL (ref 150–400)
RBC: 3.91 MIL/uL — ABNORMAL LOW (ref 4.22–5.81)
RDW: 14.5 % (ref 11.5–15.5)
WBC: 5.5 10*3/uL (ref 4.0–10.5)
nRBC: 0 % (ref 0.0–0.2)

## 2019-03-09 LAB — PROCALCITONIN: Procalcitonin: 0.24 ng/mL

## 2019-03-09 LAB — FIBRINOGEN: Fibrinogen: 711 mg/dL — ABNORMAL HIGH (ref 210–475)

## 2019-03-09 LAB — MAGNESIUM: Magnesium: 1.8 mg/dL (ref 1.7–2.4)

## 2019-03-09 LAB — FERRITIN: Ferritin: 221 ng/mL (ref 24–336)

## 2019-03-09 LAB — D-DIMER, QUANTITATIVE: D-Dimer, Quant: 0.49 ug/mL-FEU (ref 0.00–0.50)

## 2019-03-09 LAB — SARS CORONAVIRUS 2 BY RT PCR (HOSPITAL ORDER, PERFORMED IN ~~LOC~~ HOSPITAL LAB): SARS Coronavirus 2: POSITIVE — AB

## 2019-03-09 LAB — LACTATE DEHYDROGENASE: LDH: 124 U/L (ref 98–192)

## 2019-03-09 LAB — C-REACTIVE PROTEIN: CRP: 9.6 mg/dL — ABNORMAL HIGH (ref <1.0)

## 2019-03-09 LAB — PHOSPHORUS: Phosphorus: 2.7 mg/dL (ref 2.5–4.6)

## 2019-03-09 LAB — CBC
HCT: 37.7 % — ABNORMAL LOW (ref 39.0–52.0)
Hemoglobin: 11.9 g/dL — ABNORMAL LOW (ref 13.0–17.0)
MCH: 30.7 pg (ref 26.0–34.0)
MCHC: 31.6 g/dL (ref 30.0–36.0)
MCV: 97.4 fL (ref 80.0–100.0)
Platelets: 234 10*3/uL (ref 150–400)
RBC: 3.87 MIL/uL — ABNORMAL LOW (ref 4.22–5.81)
RDW: 14.4 % (ref 11.5–15.5)
WBC: 5.6 10*3/uL (ref 4.0–10.5)
nRBC: 0 % (ref 0.0–0.2)

## 2019-03-09 LAB — TRIGLYCERIDES: Triglycerides: 177 mg/dL — ABNORMAL HIGH (ref <150)

## 2019-03-09 LAB — ABO/RH: ABO/RH(D): A POS

## 2019-03-09 LAB — HEMOGLOBIN A1C
Hgb A1c MFr Bld: 7 % — ABNORMAL HIGH (ref 4.8–5.6)
Mean Plasma Glucose: 154.2 mg/dL

## 2019-03-09 LAB — LACTIC ACID, PLASMA
Lactic Acid, Venous: 3.1 mmol/L (ref 0.5–1.9)
Lactic Acid, Venous: 2.4 mmol/L (ref 0.5–1.9)
Lactic Acid, Venous: 2.6 mmol/L (ref 0.5–1.9)

## 2019-03-09 LAB — POC SARS CORONAVIRUS 2 AG -  ED: SARS Coronavirus 2 Ag: NEGATIVE

## 2019-03-09 LAB — BRAIN NATRIURETIC PEPTIDE: B Natriuretic Peptide: 43 pg/mL (ref 0.0–100.0)

## 2019-03-09 MED ORDER — SODIUM CHLORIDE 0.9 % IV BOLUS
500.0000 mL | Freq: Once | INTRAVENOUS | Status: AC
  Administered 2019-03-09: 500 mL via INTRAVENOUS

## 2019-03-09 MED ORDER — LACTATED RINGERS IV SOLN
INTRAVENOUS | Status: AC
  Administered 2019-03-09: 16:00:00 via INTRAVENOUS

## 2019-03-09 MED ORDER — TRAZODONE HCL 50 MG PO TABS
50.0000 mg | ORAL_TABLET | Freq: Every day | ORAL | Status: DC
  Administered 2019-03-09 – 2019-03-12 (×4): 50 mg via ORAL
  Filled 2019-03-09 (×4): qty 1

## 2019-03-09 MED ORDER — ACETAMINOPHEN 325 MG PO TABS
650.0000 mg | ORAL_TABLET | Freq: Four times a day (QID) | ORAL | Status: DC | PRN
  Filled 2019-03-09: qty 2

## 2019-03-09 MED ORDER — LISINOPRIL 10 MG PO TABS: 5.0000 mg | ORAL_TABLET | Freq: Every day | ORAL | Status: DC

## 2019-03-09 MED ORDER — METHYLPREDNISOLONE SODIUM SUCC 125 MG IJ SOLR: 0.5000 mg/kg | Freq: Two times a day (BID) | INTRAMUSCULAR | Status: DC

## 2019-03-09 MED ORDER — UMECLIDINIUM BROMIDE 62.5 MCG/INH IN AEPB
1.0000 | INHALATION_SPRAY | Freq: Every day | RESPIRATORY_TRACT | Status: DC
  Administered 2019-03-10 – 2019-03-13 (×4): 1 via RESPIRATORY_TRACT
  Filled 2019-03-09: qty 7

## 2019-03-09 MED ORDER — ENOXAPARIN SODIUM 40 MG/0.4ML ~~LOC~~ SOLN
40.0000 mg | SUBCUTANEOUS | Status: DC
  Administered 2019-03-09: 40 mg via SUBCUTANEOUS
  Filled 2019-03-09: qty 0.4

## 2019-03-09 MED ORDER — ENOXAPARIN SODIUM 40 MG/0.4ML ~~LOC~~ SOLN
40.0000 mg | Freq: Every day | SUBCUTANEOUS | Status: DC
  Administered 2019-03-10 – 2019-03-13 (×4): 40 mg via SUBCUTANEOUS
  Filled 2019-03-09 (×4): qty 0.4

## 2019-03-09 MED ORDER — METHYLPREDNISOLONE SODIUM SUCC 125 MG IJ SOLR
125.0000 mg | Freq: Once | INTRAMUSCULAR | Status: AC
  Administered 2019-03-09: 125 mg via INTRAVENOUS
  Filled 2019-03-09: qty 2

## 2019-03-09 MED ORDER — INSULIN ASPART 100 UNIT/ML ~~LOC~~ SOLN
0.0000 [IU] | Freq: Three times a day (TID) | SUBCUTANEOUS | Status: DC
  Administered 2019-03-09: 7 [IU] via SUBCUTANEOUS
  Administered 2019-03-10: 09:00:00 5 [IU] via SUBCUTANEOUS

## 2019-03-09 MED ORDER — ACETAMINOPHEN 325 MG PO TABS
650.0000 mg | ORAL_TABLET | Freq: Once | ORAL | Status: AC
  Administered 2019-03-09: 650 mg via ORAL
  Filled 2019-03-09: qty 2

## 2019-03-09 MED ORDER — BENZTROPINE MESYLATE 0.5 MG PO TABS
1.0000 mg | ORAL_TABLET | Freq: Two times a day (BID) | ORAL | Status: DC
  Administered 2019-03-09 – 2019-03-13 (×8): 1 mg via ORAL
  Filled 2019-03-09 (×2): qty 1
  Filled 2019-03-09: qty 2
  Filled 2019-03-09: qty 1
  Filled 2019-03-09: qty 2
  Filled 2019-03-09: qty 1
  Filled 2019-03-09: qty 2
  Filled 2019-03-09 (×2): qty 1

## 2019-03-09 MED ORDER — IPRATROPIUM-ALBUTEROL 20-100 MCG/ACT IN AERS
2.0000 | INHALATION_SPRAY | Freq: Four times a day (QID) | RESPIRATORY_TRACT | Status: DC
  Administered 2019-03-09 – 2019-03-13 (×11): 2 via RESPIRATORY_TRACT
  Filled 2019-03-09 (×2): qty 4

## 2019-03-09 MED ORDER — METHYLPREDNISOLONE SODIUM SUCC 125 MG IJ SOLR
60.0000 mg | Freq: Two times a day (BID) | INTRAMUSCULAR | Status: DC
  Administered 2019-03-09 – 2019-03-10 (×2): 60 mg via INTRAVENOUS
  Filled 2019-03-09 (×2): qty 2

## 2019-03-09 MED ORDER — DIVALPROEX SODIUM 500 MG PO DR TAB: 500.0000 mg | DELAYED_RELEASE_TABLET | Freq: Two times a day (BID) | ORAL | Status: DC

## 2019-03-09 MED ORDER — TIOTROPIUM BROMIDE MONOHYDRATE 18 MCG IN CAPS: 18.0000 ug | ORAL_CAPSULE | Freq: Every day | RESPIRATORY_TRACT | Status: DC

## 2019-03-09 MED ORDER — METOPROLOL TARTRATE 25 MG PO TABS
12.5000 mg | ORAL_TABLET | Freq: Two times a day (BID) | ORAL | Status: DC
  Administered 2019-03-09 – 2019-03-13 (×8): 12.5 mg via ORAL
  Filled 2019-03-09 (×8): qty 1

## 2019-03-09 MED ORDER — PROCHLORPERAZINE EDISYLATE 10 MG/2ML IJ SOLN: 10.0000 mg | Freq: Four times a day (QID) | INTRAMUSCULAR | Status: DC | PRN

## 2019-03-09 MED ORDER — INSULIN GLARGINE 100 UNIT/ML SOLOSTAR PEN: 40.0000 [IU] | PEN_INJECTOR | Freq: Every day | SUBCUTANEOUS | Status: DC

## 2019-03-09 MED ORDER — INSULIN ASPART 100 UNIT/ML ~~LOC~~ SOLN
0.0000 [IU] | Freq: Every day | SUBCUTANEOUS | Status: DC
  Administered 2019-03-09: 9 [IU] via SUBCUTANEOUS

## 2019-03-09 MED ORDER — ACETAMINOPHEN 650 MG RE SUPP: 650.0000 mg | Freq: Four times a day (QID) | RECTAL | Status: DC | PRN

## 2019-03-09 MED ORDER — DIVALPROEX SODIUM 500 MG PO DR TAB
500.0000 mg | DELAYED_RELEASE_TABLET | Freq: Every day | ORAL | Status: DC
  Administered 2019-03-10 – 2019-03-13 (×4): 500 mg via ORAL
  Filled 2019-03-09 (×4): qty 1

## 2019-03-09 MED ORDER — INSULIN GLARGINE 100 UNIT/ML ~~LOC~~ SOLN
40.0000 [IU] | Freq: Every day | SUBCUTANEOUS | Status: DC
  Administered 2019-03-09: 40 [IU] via SUBCUTANEOUS
  Filled 2019-03-09: qty 0.4

## 2019-03-09 MED ORDER — RISPERIDONE 2 MG PO TABS
2.0000 mg | ORAL_TABLET | Freq: Every day | ORAL | Status: DC
  Administered 2019-03-09 – 2019-03-13 (×5): 2 mg via ORAL
  Filled 2019-03-09 (×5): qty 1

## 2019-03-09 MED ORDER — ENOXAPARIN SODIUM 40 MG/0.4ML ~~LOC~~ SOLN: 40.0000 mg | SUBCUTANEOUS | Status: DC

## 2019-03-09 MED ORDER — SODIUM CHLORIDE 0.9 % IV SOLN
200.0000 mg | Freq: Once | INTRAVENOUS | Status: AC
  Administered 2019-03-09: 200 mg via INTRAVENOUS
  Filled 2019-03-09: qty 40

## 2019-03-09 MED ORDER — SODIUM CHLORIDE 0.9 % IV SOLN
100.0000 mg | INTRAVENOUS | Status: AC
  Administered 2019-03-10 – 2019-03-13 (×4): 100 mg via INTRAVENOUS
  Filled 2019-03-09 (×3): qty 100
  Filled 2019-03-09: qty 20

## 2019-03-09 MED ORDER — LITHIUM CARBONATE ER 300 MG PO TBCR
300.0000 mg | EXTENDED_RELEASE_TABLET | Freq: Two times a day (BID) | ORAL | Status: DC
  Administered 2019-03-09 – 2019-03-13 (×8): 300 mg via ORAL
  Filled 2019-03-09 (×9): qty 1

## 2019-03-09 MED ORDER — DIVALPROEX SODIUM 500 MG PO DR TAB
1000.0000 mg | DELAYED_RELEASE_TABLET | Freq: Every day | ORAL | Status: DC
  Administered 2019-03-09 – 2019-03-12 (×4): 1000 mg via ORAL
  Filled 2019-03-09 (×5): qty 2

## 2019-03-09 MED ORDER — MELATONIN 3 MG PO TABS
3.0000 mg | ORAL_TABLET | Freq: Every day | ORAL | Status: DC
  Administered 2019-03-09 – 2019-03-12 (×4): 3 mg via ORAL
  Filled 2019-03-09 (×5): qty 1
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
## 2019-03-09 NOTE — ED Notes (Signed)
Date and time results received: 03/09/19 05:20 (use smartphrase ".now" to insert current time)  Test: covid Critical Value: positive  Name of Provider Notified: Dr Betsey Holiday  Orders Received? Or Actions Taken?: see emr

## 2019-03-09 NOTE — ED Triage Notes (Signed)
Pt from [facility] with c/o sore throat, shob, fever x3 days.

## 2019-03-09 NOTE — ED Notes (Signed)
Pt placed on 4 L [place]  

## 2019-03-09 NOTE — ED Notes (Signed)
CRITICAL VALUE ALERT  Critical Value: Lactic Acid 3.1  Date & Time Notied: 03/09/19 [id number]  Provider Notified: Dr. Betsey Holiday  Orders Received/Actions taken: n/a

## 2019-03-09 NOTE — Progress Notes (Signed)
CRITICAL VALUE ALERT  Critical Value:  CBG 438  Date & Time Notied:  11/30 2042  Provider Notified: Dr. Vanita Ingles  Orders Received/Actions taken: will order lantus and 9 units SSI

## 2019-03-09 NOTE — Progress Notes (Signed)
  Patient seen and evaluated, chart reviewed, please see EMR for updated orders. Please see full H&P dictated by admitting physician Dr Olevia Bowens for same date of service.   Pt was transferred to [facility] prior to being seen here at [facility]   Discussed with Dr. Candiss Norse.....  Who is the attending at [place], MD

## 2019-03-09 NOTE — Progress Notes (Signed)
Actemra off label use - patient was told that if COVID-19 pneumonitis gets worse we might potentially use Actemra off label, she denies any known history of tuberculosis or hepatitis, understands the risks and benefits and wants to proceed with Actemra treatment if required.  Patient was also given information on convalescent plasma and he agreed for its use if needed.  Nurse [place] witnessed this conversation over the phone.  I also talked to patient's assisted living facility who said that patient was in good spirits and is able to make his basic decisions.   Signature  Lala Lund M.D on 03/09/2019 at 1:27 PM   -  To page go to [email or web address]

## 2019-03-10 DIAGNOSIS — E785 Hyperlipidemia, unspecified: Secondary | ICD-10-CM | POA: Diagnosis present

## 2019-03-10 DIAGNOSIS — I10 Essential (primary) hypertension: Secondary | ICD-10-CM | POA: Diagnosis present

## 2019-03-10 DIAGNOSIS — Z743 Need for continuous supervision: Secondary | ICD-10-CM | POA: Diagnosis not present

## 2019-03-10 DIAGNOSIS — J449 Chronic obstructive pulmonary disease, unspecified: Secondary | ICD-10-CM | POA: Diagnosis present

## 2019-03-10 DIAGNOSIS — F3160 Bipolar disorder, current episode mixed, unspecified: Secondary | ICD-10-CM | POA: Diagnosis present

## 2019-03-10 DIAGNOSIS — Z7401 Bed confinement status: Secondary | ICD-10-CM | POA: Diagnosis not present

## 2019-03-10 DIAGNOSIS — E1165 Type 2 diabetes mellitus with hyperglycemia: Secondary | ICD-10-CM | POA: Diagnosis present

## 2019-03-10 DIAGNOSIS — F79 Unspecified intellectual disabilities: Secondary | ICD-10-CM | POA: Diagnosis present

## 2019-03-10 DIAGNOSIS — E875 Hyperkalemia: Secondary | ICD-10-CM | POA: Diagnosis present

## 2019-03-10 DIAGNOSIS — Z794 Long term (current) use of insulin: Secondary | ICD-10-CM | POA: Diagnosis not present

## 2019-03-10 DIAGNOSIS — U071 COVID-19: Secondary | ICD-10-CM | POA: Diagnosis present

## 2019-03-10 DIAGNOSIS — J189 Pneumonia, unspecified organism: Secondary | ICD-10-CM | POA: Diagnosis not present

## 2019-03-10 DIAGNOSIS — K760 Fatty (change of) liver, not elsewhere classified: Secondary | ICD-10-CM | POA: Diagnosis present

## 2019-03-10 DIAGNOSIS — R5381 Other malaise: Secondary | ICD-10-CM | POA: Diagnosis not present

## 2019-03-10 DIAGNOSIS — J9601 Acute respiratory failure with hypoxia: Secondary | ICD-10-CM | POA: Diagnosis present

## 2019-03-10 DIAGNOSIS — Z79891 Long term (current) use of opiate analgesic: Secondary | ICD-10-CM | POA: Diagnosis not present

## 2019-03-10 DIAGNOSIS — F17210 Nicotine dependence, cigarettes, uncomplicated: Secondary | ICD-10-CM | POA: Diagnosis present

## 2019-03-10 DIAGNOSIS — Z87820 Personal history of traumatic brain injury: Secondary | ICD-10-CM | POA: Diagnosis not present

## 2019-03-10 DIAGNOSIS — Z79899 Other long term (current) drug therapy: Secondary | ICD-10-CM | POA: Diagnosis not present

## 2019-03-10 DIAGNOSIS — M2550 Pain in unspecified joint: Secondary | ICD-10-CM | POA: Diagnosis not present

## 2019-03-10 DIAGNOSIS — J1289 Other viral pneumonia: Secondary | ICD-10-CM | POA: Diagnosis present

## 2019-03-10 LAB — CBC WITH DIFFERENTIAL/PLATELET
Abs Immature Granulocytes: 0.03 10*3/uL (ref 0.00–0.07)
Basophils Absolute: 0 10*3/uL (ref 0.0–0.1)
Basophils Relative: 0 %
Eosinophils Absolute: 0 10*3/uL (ref 0.0–0.5)
Eosinophils Relative: 0 %
HCT: 36.2 % — ABNORMAL LOW (ref 39.0–52.0)
Hemoglobin: 11.6 g/dL — ABNORMAL LOW (ref 13.0–17.0)
Immature Granulocytes: 1 %
Lymphocytes Relative: 21 %
Lymphs Abs: 0.9 10*3/uL (ref 0.7–4.0)
MCH: 30.8 pg (ref 26.0–34.0)
MCHC: 32 g/dL (ref 30.0–36.0)
MCV: 96 fL (ref 80.0–100.0)
Monocytes Absolute: 0.4 10*3/uL (ref 0.1–1.0)
Monocytes Relative: 8 %
Neutro Abs: 3.1 10*3/uL (ref 1.7–7.7)
Neutrophils Relative %: 70 %
Platelets: 250 10*3/uL (ref 150–400)
RBC: 3.77 MIL/uL — ABNORMAL LOW (ref 4.22–5.81)
RDW: 13.9 % (ref 11.5–15.5)
WBC: 4.5 10*3/uL (ref 4.0–10.5)
nRBC: 0 % (ref 0.0–0.2)

## 2019-03-10 LAB — COMPREHENSIVE METABOLIC PANEL
ALT: 9 U/L (ref 0–44)
AST: 25 U/L (ref 15–41)
Albumin: 3 g/dL — ABNORMAL LOW (ref 3.5–5.0)
Alkaline Phosphatase: 82 U/L (ref 38–126)
Anion gap: 10 (ref 5–15)
BUN: 32 mg/dL — ABNORMAL HIGH (ref 6–20)
CO2: 16 mmol/L — ABNORMAL LOW (ref 22–32)
Calcium: 8.8 mg/dL — ABNORMAL LOW (ref 8.9–10.3)
Chloride: 112 mmol/L — ABNORMAL HIGH (ref 98–111)
Creatinine, Ser: 1 mg/dL (ref 0.61–1.24)
GFR calc Af Amer: >60 mL/min (ref >60)
GFR calc non Af Amer: >60 mL/min (ref >60)
Glucose, Bld: 343 mg/dL — ABNORMAL HIGH (ref 70–99)
Potassium: 5.4 mmol/L — ABNORMAL HIGH (ref 3.5–5.1)
Sodium: 138 mmol/L (ref 135–145)
Total Bilirubin: 1 mg/dL (ref 0.3–1.2)
Total Protein: 6.9 g/dL (ref 6.5–8.1)

## 2019-03-10 LAB — TYPE AND SCREEN
ABO/RH(D): A POS
Antibody Screen: NEGATIVE

## 2019-03-10 LAB — MAGNESIUM: Magnesium: 2.4 mg/dL (ref 1.7–2.4)

## 2019-03-10 LAB — GLUCOSE, CAPILLARY
Glucose-Capillary: 270 mg/dL — ABNORMAL HIGH (ref 70–99)
Glucose-Capillary: 283 mg/dL — ABNORMAL HIGH (ref 70–99)
Glucose-Capillary: 401 mg/dL — ABNORMAL HIGH (ref 70–99)
Glucose-Capillary: 455 mg/dL — ABNORMAL HIGH (ref 70–99)
Glucose-Capillary: 460 mg/dL — ABNORMAL HIGH (ref 70–99)

## 2019-03-10 LAB — PROCALCITONIN: Procalcitonin: 0.1 ng/mL

## 2019-03-10 LAB — HIV ANTIBODY (ROUTINE TESTING W REFLEX): HIV Screen 4th Generation wRfx: NONREACTIVE

## 2019-03-10 LAB — BRAIN NATRIURETIC PEPTIDE: B Natriuretic Peptide: 83.7 pg/mL (ref 0.0–100.0)

## 2019-03-10 LAB — D-DIMER, QUANTITATIVE: D-Dimer, Quant: 0.44 ug/mL-FEU (ref 0.00–0.50)

## 2019-03-10 LAB — C-REACTIVE PROTEIN: CRP: 10.1 mg/dL — ABNORMAL HIGH (ref <1.0)

## 2019-03-10 LAB — ABO/RH: ABO/RH(D): A POS

## 2019-03-10 MED ORDER — INSULIN ASPART 100 UNIT/ML ~~LOC~~ SOLN
4.0000 [IU] | Freq: Three times a day (TID) | SUBCUTANEOUS | Status: DC
  Administered 2019-03-10 – 2019-03-11 (×2): 4 [IU] via SUBCUTANEOUS

## 2019-03-10 MED ORDER — SODIUM POLYSTYRENE SULFONATE 15 GM/60ML PO SUSP
30.0000 g | Freq: Once | ORAL | Status: AC
  Administered 2019-03-10: 09:00:00 30 g via ORAL
  Filled 2019-03-10: qty 120

## 2019-03-10 MED ORDER — INSULIN GLARGINE 100 UNIT/ML ~~LOC~~ SOLN
25.0000 [IU] | Freq: Two times a day (BID) | SUBCUTANEOUS | Status: DC
  Filled 2019-03-10: qty 0.25

## 2019-03-10 MED ORDER — INSULIN ASPART 100 UNIT/ML ~~LOC~~ SOLN
20.0000 [IU] | Freq: Once | SUBCUTANEOUS | Status: AC
  Administered 2019-03-10: 17:00:00 20 [IU] via SUBCUTANEOUS

## 2019-03-10 MED ORDER — INSULIN GLARGINE 100 UNIT/ML ~~LOC~~ SOLN
25.0000 [IU] | Freq: Two times a day (BID) | SUBCUTANEOUS | Status: DC
  Administered 2019-03-11: 09:00:00 25 [IU] via SUBCUTANEOUS
  Filled 2019-03-10 (×2): qty 0.25

## 2019-03-10 MED ORDER — INSULIN ASPART 100 UNIT/ML ~~LOC~~ SOLN
30.0000 [IU] | Freq: Once | SUBCUTANEOUS | Status: AC
  Administered 2019-03-10: 13:00:00 30 [IU] via SUBCUTANEOUS

## 2019-03-10 MED ORDER — INSULIN GLARGINE 100 UNIT/ML ~~LOC~~ SOLN
25.0000 [IU] | Freq: Two times a day (BID) | SUBCUTANEOUS | Status: DC
  Administered 2019-03-10: 13:00:00 25 [IU] via SUBCUTANEOUS
  Filled 2019-03-10 (×2): qty 0.25

## 2019-03-10 MED ORDER — SODIUM CHLORIDE 0.9 % IV SOLN
INTRAVENOUS | Status: DC | PRN
  Administered 2019-03-10: 10:00:00 250 mL via INTRAVENOUS

## 2019-03-10 MED ORDER — AMLODIPINE BESYLATE 10 MG PO TABS: 10.0000 mg | ORAL_TABLET | Freq: Every day | ORAL | Status: DC

## 2019-03-10 MED ORDER — LACTATED RINGERS IV SOLN
INTRAVENOUS | Status: AC
  Administered 2019-03-10 (×3): via INTRAVENOUS

## 2019-03-10 MED ORDER — INSULIN ASPART 100 UNIT/ML ~~LOC~~ SOLN
0.0000 [IU] | Freq: Three times a day (TID) | SUBCUTANEOUS | Status: DC
  Administered 2019-03-11: 17:00:00 11 [IU] via SUBCUTANEOUS
  Administered 2019-03-11: 7 [IU] via SUBCUTANEOUS
  Administered 2019-03-12 (×2): 20 [IU] via SUBCUTANEOUS
  Administered 2019-03-13: 11 [IU] via SUBCUTANEOUS

## 2019-03-10 MED ORDER — INSULIN ASPART 100 UNIT/ML ~~LOC~~ SOLN
0.0000 [IU] | Freq: Every day | SUBCUTANEOUS | Status: DC
  Administered 2019-03-10 – 2019-03-11 (×2): 3 [IU] via SUBCUTANEOUS

## 2019-03-10 MED ORDER — METHYLPREDNISOLONE SODIUM SUCC 40 MG IJ SOLR
40.0000 mg | Freq: Two times a day (BID) | INTRAMUSCULAR | Status: DC
  Administered 2019-03-10 – 2019-03-12 (×4): 40 mg via INTRAVENOUS
  Filled 2019-03-10 (×4): qty 1

## 2019-03-10 NOTE — Progress Notes (Addendum)
   03/10/19 0513  MEWS Assessment  Is this an acute change? Yes  MEWS guidelines implemented *See Row Information* Yellow  Provider Notification  Provider Name/Title Peyton Bottoms MD  Date Provider Notified 03/10/19  Time Provider Notified [phone]  Notification Type Page  Notification Reason Change in status (mews yellow BP soft)     Vital Signs MEWS/VS Documentation      03/10/2019 0200 03/10/2019 0300 03/10/2019 0400 03/10/2019 0458   MEWS Score:  0  0  0  2   MEWS Score Color:  Green  Green  Green  Yellow   Resp:  -  -  -  11   Pulse:  69  75  65  75   BP:  -  -  -  99/64   Temp:  -  -  -  (!) 97.2 F (36.2 C)   O2 Device:  Nasal Cannula  Nasal Cannula  Nasal Cannula  Nasal Cannula   O2 Flow Rate (L/min):  3 L/min  3 L/min  3 L/min  -           Duanne Limerick Layne 03/10/2019,5:14 AM

## 2019-03-10 NOTE — Progress Notes (Signed)
PROGRESS NOTE                                                                                                                                                                                                             Patient Demographics:    Scott [name], is a 58 y.o. male, DOB - 04-12-1960, YBF:[id number]  Outpatient Primary MD for the patient is Rosita Fire, MD    LOS - 0  Admit date - 03/09/2019    CC - Cough    Brief Narrative  Scott [name] is a 58 y.o. male with medical history significant of osteoarthritis, bipolar 1 disorder, depression, type 2 diabetes, MR/traumatic brain injury, history of pneumonia who is sent from [place] home for evaluation of fever, sore throat and shortness of breath for a few days.  He presented to [facility] ER from where he was sent to [facility] for COVID-19 pneumonia.   Subjective:    Scott [name] today has, No headache, No chest pain, No abdominal pain - No Nausea, No new weakness tingling or numbness, he denies any cough or shortness of breath at this time.   Assessment  & Plan :     1. Acute Hypoxic Resp. Failure due to Acute Covid 19 Viral Pneumonitis during the ongoing 2020 Covid 19 Pandemic - mild to moderate disease clinically, stable on RA in bed, symptom-free, denies any distress.  Continue on IV steroids and remdesivir.  Continue monitoring clinically along with his inflammatory markers.  Consent has been obtained for both plasma and Actemra if needed.  Encouraged him to sit up in chair in the daytime use I-S and flutter valve for pulmonary toiletry and then prone in bed when at night.  Actemra off label use - patient was told that if COVID-19 pneumonitis gets worse we might potentially use Actemra off label, she denies any known history of tuberculosis or hepatitis, understands the risks and benefits and wants to proceed with Actemra treatment if required.   Convalescent plasma  - Patient was also informed in detail about convalescent plasma use, he has consented for the same.   Hepatic Function Latest Ref Rng & Units 03/10/2019 03/09/2019 03/09/2019  Total Protein 6.5 - 8.1 g/dL 6.9 7.2 7.4  Albumin 3.5 - 5.0 g/dL 3.0(L) 3.1(L) 3.2(L)  AST 15 - 41 U/L 25  27 27  ALT 0 - 44 U/L '9 13 13  '$ Alk Phosphatase 38 - 126 U/L 82 91 98  Total Bilirubin 0.3 - 1.2 mg/dL 1.0 0.4 0.5    COVID-19 Labs  Recent Labs    03/09/19 0255 03/10/19 0239  DDIMER 0.49 0.44  FERRITIN 221  --   LDH 124  --   CRP 9.6* 10.1*    Lab Results  Component Value Date   SARSCOV2NAA POSITIVE (A) 03/09/2019        Component Value Date/Time   BNP 83.7 03/10/2019 0239    Recent Labs  Lab 03/09/19 0255 03/10/19 0239  PROCALCITON 0.24 0.10    2.  TBI.  Able to answer basic questions and perform basic tasks.  Stable.  Supportive care.  Discharge back to ALF once stable medically.  3.  Hypertension.  Stable on beta-blocker.  ACE inhibitor held due to hyperkalemia.  4.  History of seizures due to TBI.  Continue Depakote.  5.  Bipolar disorder.  Continue home medications.   6.  Hyperkalemia.  Kayexalate, DC ACE inhibitor.    Condition - Fair  Family Communication  :  None  Code Status :  Full  Diet :    Diet Order            DIET DYS 3 Room service appropriate? Yes; Fluid consistency: Thin  Diet effective now               Disposition Plan  : ALF  Consults  :  None  Procedures  :      PUD Prophylaxis :   DVT Prophylaxis  :  Lovenox   Lab Results  Component Value Date   PLT 250 03/10/2019    Inpatient Medications  Scheduled Meds: . benztropine  1 mg Oral BID  . divalproex  1,000 mg Oral QHS   And  . divalproex  500 mg Oral Daily  . enoxaparin (LOVENOX) injection  40 mg Subcutaneous Daily  . insulin aspart  0-5 Units Subcutaneous QHS  . insulin aspart  0-9 Units Subcutaneous TID WC  . insulin glargine  40 Units Subcutaneous  QHS  . Ipratropium-Albuterol  2 puff Inhalation Q6H  . lithium carbonate  300 mg Oral Q12H  . Melatonin  3 mg Oral QHS  . methylPREDNISolone (SOLU-MEDROL) injection  40 mg Intravenous Q12H  . metoprolol tartrate  12.5 mg Oral BID  . risperiDONE  2 mg Oral Daily  . traZODone  50 mg Oral QHS  . umeclidinium bromide  1 puff Inhalation Daily   Continuous Infusions: . sodium chloride 250 mL (03/10/19 1007)  . lactated ringers Stopped (03/10/19 1003)  . remdesivir 100 mg in NS 250 mL 100 mg (03/10/19 1008)   PRN Meds:.sodium chloride, acetaminophen **OR** acetaminophen, prochlorperazine  Antibiotics  :    Anti-infectives (From admission, onward)   Start     Dose/Rate Route Frequency Ordered Stop   03/10/19 1000  remdesivir 100 mg in sodium chloride 0.9 % 250 mL IVPB     100 mg 500 mL/hr over 30 Minutes Intravenous Every 24 hours 03/09/19 1352 03/14/19 0959   03/09/19 1600  remdesivir 200 mg in sodium chloride 0.9 % 250 mL IVPB     200 mg 500 mL/hr over 30 Minutes Intravenous Once 03/09/19 1352 03/10/19 0747       Time Spent in minutes  30   Lala Lund M.D on 03/10/2019 at 11:52 AM  To page go to [email or web address] - password  [email or web address]  [phone]  See all Orders from today for further details    Objective:   Vitals:   03/10/19 0458 03/10/19 0708 03/10/19 0801 03/10/19 1016  BP: '99/64 99/63 95/65 '$ 110/70  Pulse: 75 67 67 76  Resp: '11 12 18 16  '$ Temp: (!) 97.2 F (36.2 C) (!) 97.5 F (36.4 C) 97.9 F (36.6 C) 97.7 F (36.5 C)  TempSrc: Axillary Oral Oral Axillary  SpO2: 96% 97% 98% 94%  Weight:      Height:        Wt Readings from Last 3 Encounters:  03/09/19 98 kg  11/19/18 96.2 kg  02/06/17 93 kg     Intake/Output Summary (Last 24 hours) at 03/10/2019 1152 Last data filed at 03/10/2019 0855 Gross per 24 hour  Intake -  Output 1575 ml  Net -1575 ml     Physical Exam  Awake Alert,  No new F.N deficits,   [place].AT,PERRAL Supple  Neck,No JVD, No cervical lymphadenopathy appriciated.  Symmetrical Chest wall movement, Good air movement bilaterally, CTAB RRR,No Gallops,Rubs or new Murmurs, No Parasternal Heave +ve B.Sounds, Abd Soft, No tenderness, No organomegaly appriciated, No rebound - guarding or rigidity. No Cyanosis, Clubbing or edema, No new Rash or bruise      Data Review:    CBC Recent Labs  Lab 03/09/19 0255 03/09/19 0422 03/10/19 0239  WBC 5.5 5.6 4.5  HGB 12.1* 11.9* 11.6*  HCT 38.1* 37.7* 36.2*  PLT 227 234 250  MCV 97.4 97.4 96.0  MCH 30.9 30.7 30.8  MCHC 31.8 31.6 32.0  RDW 14.5 14.4 13.9  LYMPHSABS 1.6  --  0.9  MONOABS 0.6  --  0.4  EOSABS 0.0  --  0.0  BASOSABS 0.0  --  0.0    Chemistries  Recent Labs  Lab 03/09/19 0255 03/09/19 0417 03/09/19 0422 03/10/19 0239  NA 136  --  137 138  K 4.6  --  4.7 5.4*  CL 108  --  108 112*  CO2 17*  --  18* 16*  GLUCOSE 129*  --  119* 343*  BUN 17  --  17 32*  CREATININE 1.10  --  1.02 1.00  CALCIUM 8.9  --  8.9 8.8*  MG  --  1.8  --  2.4  AST 27  --  27 25  ALT 13  --  13 9  ALKPHOS 98  --  91 82  BILITOT 0.5  --  0.4 1.0   ------------------------------------------------------------------------------------------------------------------ Recent Labs    03/09/19 0255  TRIG 177*    Lab Results  Component Value Date   HGBA1C 7.0 (H) 03/09/2019   ------------------------------------------------------------------------------------------------------------------ No results for input(s): TSH, T4TOTAL, T3FREE, THYROIDAB in the last 72 hours.  Invalid input(s): FREET3  Cardiac Enzymes No results for input(s): CKMB, TROPONINI, MYOGLOBIN in the last 168 hours.  Invalid input(s): CK ------------------------------------------------------------------------------------------------------------------    Component Value Date/Time   BNP 83.7 03/10/2019 0239    Micro Results Recent Results (from the past 240 hour(s))  Blood Culture  (routine x 2)     Status: None (Preliminary result)   Collection Time: 03/09/19  2:55 AM   Specimen: Left Antecubital; Blood  Result Value Ref Range Status   Specimen Description LEFT ANTECUBITAL  Final   Special Requests   Final    BOTTLES DRAWN AEROBIC AND ANAEROBIC Blood Culture adequate volume   Culture   Final    NO GROWTH 1 DAY Performed at  [facility], [address], [place], [place] [address]    Report Status PENDING  Incomplete  SARS Coronavirus 2 by RT PCR (hospital order, performed in [facility] hospital lab) Nasopharyngeal Nasopharyngeal Swab     Status: Abnormal   Collection Time: 03/09/19  3:35 AM   Specimen: Nasopharyngeal Swab  Result Value Ref Range Status   SARS Coronavirus 2 POSITIVE (A) NEGATIVE Final    Comment: CRITICAL RESULT CALLED TO, READ BACK BY AND VERIFIED WITH: POINDEXTER,M @ 0506 ON 03/09/19 BY JUW Performed at [facility], [address] [place], [place] [address]   Blood Culture (routine x 2)     Status: None (Preliminary result)   Collection Time: 03/09/19  4:18 AM   Specimen: BLOOD  Result Value Ref Range Status   Specimen Description BLOOD RIGHT ANTECUBITAL  Final   Special Requests   Final    BOTTLES DRAWN AEROBIC AND ANAEROBIC Blood Culture adequate volume   Culture   Final    NO GROWTH 1 DAY Performed at [facility], [address], [place], [place] [address]    Report Status PENDING  Incomplete    Radiology Reports Dg Chest Port 1 View  Result Date: 03/09/2019 CLINICAL DATA:  Fever, cough, shortness of breath EXAM: PORTABLE CHEST 1 VIEW COMPARISON:  01/24/2016 FINDINGS: Heart and mediastinal contours are within normal limits. No focal opacities or effusions. No acute bony abnormality. IMPRESSION: No active disease. Electronically Signed   By: Rolm Baptise M.D.   On: 03/09/2019 03:25

## 2019-03-10 NOTE — Progress Notes (Signed)
MD notified of CBG 401 after novolog and lantus administered per order. Hortencia Conradi RN

## 2019-03-10 NOTE — Progress Notes (Signed)
CBG this AM 270 and lunch 460. Not uploading into Epic, noted on machine and verified with NT.

## 2019-03-10 NOTE — Evaluation (Signed)
Physical Therapy Evaluation Patient Details Name: Scott [name] MRN: [id number] DOB: [birth year] Today's Date: 03/10/2019   History of Present Illness  58 y.o. male with medical history significant of osteoarthritis, bipolar 1 disorder, depression, type 2 diabetes, MR/traumatic brain injury, history of pneumonia who is sent from [place] home for evaluation of fever, sore throat and shortness of breath for a few days.  He presented to [facility] ER from where he was sent to [facility] for COVID-19 pneumonia.  Clinical Impression  The patient is very pleasant and able to participate in functional mobil;ity. Patient ambulated  X 80' on 3 L [place] with SPO2 88-94%. Noted HR up to 120's after return to sit down. Patient instructed in use f flutter  Device and demonstrated back. \ Per Case management, plans are for patient to return to group home if patient is near his baseline of ambulatory with RW. Pt admitted with above diagnosis.  Pt currently with functional limitations due to the deficits listed below (see PT Problem List). Pt will benefit from skilled PT to increase their independence and safety with mobility to allow discharge to the venue listed below.       Follow Up Recommendations Home health PT    Equipment Recommendations  None recommended by PT    Recommendations for Other Services       Precautions / Restrictions Precautions Precautions: Fall      Mobility  Bed Mobility Overal bed mobility: Needs Assistance Bed Mobility: Supine to Sit     Supine to sit: Supervision     General bed mobility comments: OOB with OT  Transfers Overall transfer level: Needs assistance Equipment used: Rolling walker (2 wheeled) Transfers: Sit to/from Stand Sit to Stand: Supervision         General transfer comment: cues for hand placement to sit down  Ambulation/Gait Ambulation/Gait assistance: Min assist Gait Distance (Feet): 80 Feet Assistive device: Rolling walker (2  wheeled) Gait Pattern/deviations: Step-through pattern;Shuffle;Trunk flexed;Decreased stride length Gait velocity: decr   General Gait Details: Steady assist for ambulation in room and hall  Stairs            Wheelchair Mobility    Modified Rankin (Stroke Patients Only)       Balance Overall balance assessment: Needs assistance   Sitting balance-Leahy Scale: Good     Standing balance support: Bilateral upper extremity supported;During functional activity Standing balance-Leahy Scale: Poor Standing balance comment: appears dependent on external support; able ot release RW but swaying noted                             Pertinent Vitals/Pain Pain Assessment: No/denies pain    Home Living Family/patient expects to be discharged to:: Group home               Home Equipment: Walker - 2 wheels;Shower seat      Prior Function Level of [place]: Needs assistance;Independent with assistive device(s)   Gait / Transfers Assistance Needed: modified independent with RW  ADL's / Homemaking Assistance Needed: assisted by staff as needed for ADL        Hand Dominance   Dominant Hand: Right    Extremity/Trunk Assessment   Upper Extremity Assessment Upper Extremity Assessment: Generalized weakness    Lower Extremity Assessment Lower Extremity Assessment: Generalized weakness    Cervical / Trunk Assessment Cervical / Trunk Assessment: Normal  Communication   Communication: Expressive difficulties  Cognition  Arousal/Alertness: Awake/alert Behavior During Therapy: WFL for tasks assessed/performed Overall Cognitive Status: History of cognitive impairments - at baseline                                        General Comments      Exercises Other Exercises Other Exercises: flutter valve x 5   Assessment/Plan    PT Assessment Patient needs continued PT services  PT Problem List Decreased strength;Decreased  mobility;Decreased safety awareness;Decreased knowledge of precautions;Decreased activity tolerance;Decreased cognition;Cardiopulmonary status limiting activity;Decreased balance;Decreased knowledge of use of DME       PT Treatment Interventions DME instruction;Therapeutic activities;Cognitive remediation;Gait training;Therapeutic exercise;Patient/family education;Functional mobility training    PT Goals (Current goals can be found in the Care Plan section)  Acute Rehab PT Goals Patient Stated Goal: to go back home PT Goal Formulation: With patient Time For Goal Achievement: 03/24/19 Potential to Achieve Goals: Good    Frequency Min 3X/week   Barriers to discharge   hope to return to group home    Co-evaluation               AM-PAC PT "6 Clicks" Mobility  Outcome Measure Help needed turning from your back to your side while in a flat bed without using bedrails?: A Little Help needed moving from lying on your back to sitting on the side of a flat bed without using bedrails?: A Little Help needed moving to and from a bed to a chair (including a wheelchair)?: A Little Help needed standing up from a chair using your arms (e.g., wheelchair or bedside chair)?: A Little Help needed to walk in hospital room?: A Little Help needed climbing 3-5 steps with a railing? : A Lot 6 Click Score: 17    End of Session Equipment Utilized During Treatment: Gait belt;Oxygen Activity Tolerance: Patient tolerated treatment well Patient left: in chair;with call bell/phone within reach;with chair alarm set Nurse Communication: Mobility status PT Visit Diagnosis: Unsteadiness on feet (R26.81);Difficulty in walking, not elsewhere classified (R26.2)    Time: [id number] PT Time Calculation (min) (ACUTE ONLY): 25 min   Charges:   PT Evaluation $PT Eval Low Complexity: 1 Low PT Treatments $Gait Training: 8-22 mins        Tresa Endo PT Acute Rehabilitation Services Pager [phone] Office  [phone]   Claretha Cooper 03/10/2019, 1:19 PM

## 2019-03-10 NOTE — Progress Notes (Signed)
Inpatient Diabetes Program Recommendations  AACE/ADA: New Consensus Statement on Inpatient Glycemic Control (2015)  Target Ranges:  Prepandial:   less than 140 mg/dL      Peak postprandial:   less than 180 mg/dL (1-2 hours)      Critically ill patients:  140 - 180 mg/dL   Lab Results  Component Value Date   GLUCAP 270 (H) 03/10/2019   HGBA1C 7.0 (H) 03/09/2019    Review of Glycemic Control Results for Scott [name], Scott [name] (MRN [id number]) as of 03/10/2019 10:42  Ref. Range 03/09/2019 16:38 03/09/2019 20:36 03/10/2019 00:02 03/10/2019 08:04  Glucose-Capillary Latest Ref Range: 70 - 99 mg/dL 319 (H) 415 (H) 401 (H) 270 (H)   Diabetes history: DM 2 Outpatient Diabetes medications:  Lantus 40 units q HS, Tradjenta 5 mg daily, Metformin 1000 mg bid Current orders for Inpatient glycemic control:  Novolog sensitive tid with meals and HS Lantus 40 units q HS Solumedrol 40 mg IV q 12 hours Inpatient Diabetes Program Recommendations:    -May consider increasing Lantus to 50 units q HS.  -Consider restarting home dose of Tradjenta 5 mg daily. -Consider adding Novolog 6 units tid with meals (hold if patient eats less than 50%).   Thanks,  Adah Perl, RN, BC-ADM Inpatient Diabetes Coordinator Pager [phone] (8a-5p)

## 2019-03-10 NOTE — Progress Notes (Signed)
Occupational Therapy Evaluation Patient Details Name: Scott [name] MRN: [id number] DOB: March 21, 1961 Today's Date: 03/10/2019    History of Present Illness 58 y.o. male with medical history significant of osteoarthritis, bipolar 1 disorder, depression, type 2 diabetes, MR/traumatic brain injury, history of pneumonia who is sent from [place] home for evaluation of fever, sore throat and shortness of breath for a few days.  He presented to [facility] ER from where he was sent to [facility] for COVID-19 pneumonia.   Clinical Impression   PTA, pt living at [facility] and was modified independent with mobility and required occasional assist with ADL by staff @ RW level. Pt seen on 3L O2 with SpO2 dropping to 86 with rebound to low 90s during standing grooming task at sink. DOE 1/4. Feel pt will be appropriate to DC back to nursing home. Recommend follow up OT at facility. Will follow acutely to facilitate safe DC to next venue of care.     Follow Up Recommendations  Home health OT;Supervision/Assistance - 24 hour(Return to ALF)    Equipment Recommendations  None recommended by OT    Recommendations for Other Services       Precautions / Restrictions Precautions Precautions: Fall      Mobility Bed Mobility Overal bed mobility: Needs Assistance Bed Mobility: Supine to Sit     Supine to sit: Supervision        Transfers Overall transfer level: Needs assistance Equipment used: Rolling walker (2 wheeled) Transfers: Sit to/from Stand Sit to Stand: Min guard              Balance Overall balance assessment: Needs assistance   Sitting balance-Leahy Scale: Good       Standing balance-Leahy Scale: Poor Standing balance comment: appears dependent on external support; able ot release RW but swaying noted                           ADL either performed or assessed with clinical judgement   ADL Overall ADL's : Needs assistance/impaired      Grooming: Set up;Sitting;Standing;Min guard   Upper Body Bathing: Set up;Sitting   Lower Body Bathing: Minimal assistance;Sit to/from stand   Upper Body Dressing : Set up;Sitting   Lower Body Dressing: Minimal assistance;Sit to/from stand   Toilet Transfer: Min guard;RW   Toileting- Water quality scientist and Hygiene: Minimal assistance Toileting - Clothing Manipulation Details (indicate cue type and reason): clothing managemetn     Functional mobility during ADLs: Min guard;Rolling walker;Cueing for safety       Vision         Perception     Praxis      Pertinent Vitals/Pain Pain Assessment: No/denies pain     Hand Dominance Right   Extremity/Trunk Assessment Upper Extremity Assessment Upper Extremity Assessment: Generalized weakness   Lower Extremity Assessment Lower Extremity Assessment: Defer to PT evaluation   Cervical / Trunk Assessment Cervical / Trunk Assessment: Other exceptions;Kyphotic(forward head)   Communication     Cognition Arousal/Alertness: Awake/alert Behavior During Therapy: WFL for tasks assessed/performed Overall Cognitive Status: History of cognitive impairments - at baseline                                     General Comments       Exercises     Shoulder Instructions      Home  Living Family/patient expects to be discharged to:: Group home                             Home Equipment: Gilford Rile - 2 wheels;Shower seat          Prior Functioning/Environment Level of [place]: Needs assistance;Independent with assistive device(s)  Gait / Transfers Assistance Needed: modified independent with RW ADL's / Homemaking Assistance Needed: assisted by staff as needed for ADL            OT Problem List: Decreased strength;Decreased activity tolerance;Impaired balance (sitting and/or standing);Decreased safety awareness;Decreased knowledge of use of DME or AE;Cardiopulmonary status limiting activity       OT Treatment/Interventions: Self-care/ADL training;Therapeutic exercise;Neuromuscular education;Energy conservation;DME and/or AE instruction;Therapeutic activities;Cognitive remediation/compensation;Patient/family education;Balance training    OT Goals(Current goals can be found in the care plan section) Acute Rehab OT Goals Patient Stated Goal: to go back home OT Goal Formulation: With patient Time For Goal Achievement: 03/24/19 Potential to Achieve Goals: Good  OT Frequency: Min 2X/week   Barriers to D/C:            Co-evaluation              AM-PAC OT "6 Clicks" Daily Activity     Outcome Measure Help from another person eating meals?: A Little Help from another person taking care of personal grooming?: A Little Help from another person toileting, which includes using toliet, bedpan, or urinal?: A Little Help from another person bathing (including washing, rinsing, drying)?: A Little Help from another person to put on and taking off regular upper body clothing?: A Little Help from another person to put on and taking off regular lower body clothing?: A Lot 6 Click Score: 17   End of Session Equipment Utilized During Treatment: Rolling walker;Gait belt;Oxygen(3L) Nurse Communication: Mobility status  Activity Tolerance: Patient tolerated treatment well Patient left: Other (comment)(walking with PT)  OT Visit Diagnosis: Unsteadiness on feet (R26.81);Muscle weakness (generalized) (M62.81);Other symptoms and signs involving cognitive function                Time: 1020-1045 OT Time Calculation (min): 25 min Charges:  OT General Charges $OT Visit: 1 Visit OT Evaluation $OT Eval Moderate Complexity: 1 Mod OT Treatments $Self Care/Home Management : 8-22 mins  Maurie Boettcher, OT/L   Acute OT Clinical Specialist [place] Pager [phone] Office [phone]   [facility] 03/10/2019, 12:45 PM

## 2019-03-11 LAB — CBC WITH DIFFERENTIAL/PLATELET
Abs Immature Granulocytes: 0.07 10*3/uL (ref 0.00–0.07)
Basophils Absolute: 0 10*3/uL (ref 0.0–0.1)
Basophils Relative: 0 %
Eosinophils Absolute: 0 10*3/uL (ref 0.0–0.5)
Eosinophils Relative: 0 %
HCT: 34.2 % — ABNORMAL LOW (ref 39.0–52.0)
Hemoglobin: 10.8 g/dL — ABNORMAL LOW (ref 13.0–17.0)
Immature Granulocytes: 1 %
Lymphocytes Relative: 15 %
Lymphs Abs: 1.3 10*3/uL (ref 0.7–4.0)
MCH: 30.8 pg (ref 26.0–34.0)
MCHC: 31.6 g/dL (ref 30.0–36.0)
MCV: 97.4 fL (ref 80.0–100.0)
Monocytes Absolute: 0.6 10*3/uL (ref 0.1–1.0)
Monocytes Relative: 7 %
Neutro Abs: 6.8 10*3/uL (ref 1.7–7.7)
Neutrophils Relative %: 77 %
Platelets: 258 10*3/uL (ref 150–400)
RBC: 3.51 MIL/uL — ABNORMAL LOW (ref 4.22–5.81)
RDW: 14 % (ref 11.5–15.5)
WBC: 8.7 10*3/uL (ref 4.0–10.5)
nRBC: 0 % (ref 0.0–0.2)

## 2019-03-11 LAB — GLUCOSE, CAPILLARY
Glucose-Capillary: 208 mg/dL — ABNORMAL HIGH (ref 70–99)
Glucose-Capillary: 416 mg/dL — ABNORMAL HIGH (ref 70–99)
Glucose-Capillary: 279 mg/dL — ABNORMAL HIGH (ref 70–99)

## 2019-03-11 LAB — COMPREHENSIVE METABOLIC PANEL
ALT: 13 U/L (ref 0–44)
AST: 18 U/L (ref 15–41)
Albumin: 2.6 g/dL — ABNORMAL LOW (ref 3.5–5.0)
Alkaline Phosphatase: 73 U/L (ref 38–126)
Anion gap: 12 (ref 5–15)
BUN: 38 mg/dL — ABNORMAL HIGH (ref 6–20)
CO2: 16 mmol/L — ABNORMAL LOW (ref 22–32)
Calcium: 8.2 mg/dL — ABNORMAL LOW (ref 8.9–10.3)
Chloride: 110 mmol/L (ref 98–111)
Creatinine, Ser: 1.11 mg/dL (ref 0.61–1.24)
GFR calc Af Amer: >60 mL/min (ref >60)
GFR calc non Af Amer: >60 mL/min (ref >60)
Glucose, Bld: 290 mg/dL — ABNORMAL HIGH (ref 70–99)
Potassium: 4 mmol/L (ref 3.5–5.1)
Sodium: 138 mmol/L (ref 135–145)
Total Bilirubin: 0.2 mg/dL — ABNORMAL LOW (ref 0.3–1.2)
Total Protein: 6.5 g/dL (ref 6.5–8.1)

## 2019-03-11 LAB — PROCALCITONIN: Procalcitonin: <0.1 ng/mL

## 2019-03-11 LAB — BRAIN NATRIURETIC PEPTIDE: B Natriuretic Peptide: 107.1 pg/mL — ABNORMAL HIGH (ref 0.0–100.0)

## 2019-03-11 LAB — MAGNESIUM: Magnesium: 2.2 mg/dL (ref 1.7–2.4)

## 2019-03-11 LAB — C-REACTIVE PROTEIN: CRP: 5.5 mg/dL — ABNORMAL HIGH (ref <1.0)

## 2019-03-11 LAB — D-DIMER, QUANTITATIVE: D-Dimer, Quant: 0.4 ug/mL-FEU (ref 0.00–0.50)

## 2019-03-11 MED ORDER — INSULIN GLARGINE 100 UNIT/ML ~~LOC~~ SOLN
32.0000 [IU] | Freq: Two times a day (BID) | SUBCUTANEOUS | Status: DC
  Administered 2019-03-11 – 2019-03-13 (×4): 32 [IU] via SUBCUTANEOUS
  Filled 2019-03-11 (×5): qty 0.32

## 2019-03-11 MED ORDER — INSULIN ASPART 100 UNIT/ML ~~LOC~~ SOLN
10.0000 [IU] | Freq: Three times a day (TID) | SUBCUTANEOUS | Status: DC
  Administered 2019-03-11 – 2019-03-12 (×4): 10 [IU] via SUBCUTANEOUS

## 2019-03-11 MED ORDER — INSULIN ASPART 100 UNIT/ML ~~LOC~~ SOLN
30.0000 [IU] | Freq: Once | SUBCUTANEOUS | Status: AC
  Administered 2019-03-11: 12:00:00 30 [IU] via SUBCUTANEOUS

## 2019-03-11 MED ORDER — LINAGLIPTIN 5 MG PO TABS
5.0000 mg | ORAL_TABLET | Freq: Every day | ORAL | Status: DC
  Administered 2019-03-11 – 2019-03-13 (×3): 5 mg via ORAL
  Filled 2019-03-11 (×3): qty 1

## 2019-03-11 NOTE — Progress Notes (Signed)
Scott [name]  [id number] DOB: Aug 29, 1960 DOA: 03/09/2019 PCP: Rosita Fire, MD    Brief Narrative:  58 year old with a history of osteoarthritis, bipolar 1 disorder, depression, DM 2, and traumatic brain injury who was [facility] from [facility] with fever, sore throat, and shortness of breath for a few days.  He was diagnosed with Covid pneumonia and transferred to [facility].  Significant Events:   COVID-19 specific Treatment: Remdesivir 11/30 >  Subjective: Resting comfortably in a bedside chair.  Is pleasant and in good spirits.  Denies chest pain nausea or vomiting.  Assessment & Plan:  Covid pneumonia -acute hypoxic respiratory failure Continue remdesivir and steroids - appears relatively stable for now  Recent Labs  Lab 03/09/19 0255 03/09/19 0422 03/10/19 0239 03/11/19 0105  DDIMER 0.49  --  0.44 0.40  FERRITIN 221  --   --   --   CRP 9.6*  --  10.1* 5.5*  ALT 13 13 9 13   PROCALCITON 0.24  --  0.10 <0.10    DM2 uncontrolled w/ Hyperglycemia CBG significantly elevated due to Decadron -adjust insulin and follow trend  Traumatic brain injury At baseline is able to answer basic questions and perform basic tasks -lives in a supportive facility -appears to be at his baseline presently  HTN Stable on beta-blocker  History of seizure Continue Depakote  Bipolar disorder Continue usual home medications  Hyperkalemia ACE inhibitor discontinued -required transient dosing of Kayexalate   DVT prophylaxis: Lovenox Code Status: FULL CODE Family Communication:  Disposition Plan: med/surg - ambulate - complete course of remdesivir   Consultants:  none  Antimicrobials:  None  Objective: Blood pressure 118/78, pulse 68, temperature 97.9 F (36.6 C), temperature source Axillary, resp. rate 16, height 6\' 2"  (1.88 m), weight 98 kg, SpO2 93 %.  Intake/Output Summary (Last 24 hours) at 03/11/2019 0953 Last data filed at 03/11/2019 0553 Gross per 24  hour  Intake 960.35 ml  Output 2000 ml  Net -1039.65 ml   Filed Weights   03/09/19 0236  Weight: 98 kg    Examination: General: No acute respiratory distress Lungs: Scattered fine crackles throughout Cardiovascular: Regular rate and rhythm without murmur gallop or rub normal S1 and S2 Abdomen: Nontender, nondistended, soft, bowel sounds positive, no rebound, no ascites, no appreciable mass Extremities: No significant cyanosis, clubbing, or edema bilateral lower extremities  CBC: Recent Labs  Lab 03/09/19 0255 03/09/19 0422 03/10/19 0239 03/11/19 0105  WBC 5.5 5.6 4.5 8.7  NEUTROABS 3.3  --  3.1 6.8  HGB 12.1* 11.9* 11.6* 10.8*  HCT 38.1* 37.7* 36.2* 34.2*  MCV 97.4 97.4 96.0 97.4  PLT 227 234 250 0000000   Basic Metabolic Panel: Recent Labs  Lab 03/09/19 0417 03/09/19 0422 03/10/19 0239 03/11/19 0105  NA  --  137 138 138  K  --  4.7 5.4* 4.0  CL  --  108 112* 110  CO2  --  18* 16* 16*  GLUCOSE  --  119* 343* 290*  BUN  --  17 32* 38*  CREATININE  --  1.02 1.00 1.11  CALCIUM  --  8.9 8.8* 8.2*  MG 1.8  --  2.4 2.2  PHOS 2.7  --   --   --    GFR: Estimated Creatinine Clearance: 85.4 mL/min (by C-G formula based on SCr of 1.11 mg/dL).  Liver Function Tests: Recent Labs  Lab 03/09/19 0255 03/09/19 0422 03/10/19 0239 03/11/19 0105  AST 27 27 25  18  ALT 13 13 9 13   ALKPHOS 98 91 82 73  BILITOT 0.5 0.4 1.0 0.2*  PROT 7.4 7.2 6.9 6.5  ALBUMIN 3.2* 3.1* 3.0* 2.6*    HbA1C: Hemoglobin A1C  Date/Time Value Ref Range Status  11/19/2018 9.4  Final   Hgb A1c MFr Bld  Date/Time Value Ref Range Status  03/09/2019 02:55 AM 7.0 (H) 4.8 - 5.6 % Final    Comment:    (NOTE) Pre diabetes:          5.7%-6.4% Diabetes:              >6.4% Glycemic control for   <7.0% adults with diabetes   04/27/2015 09:34 PM 6.3 (H) 4.0 - 6.0 % Final    CBG: Recent Labs  Lab 03/10/19 0804 03/10/19 1213 03/10/19 1621 03/10/19 2202 03/11/19 0809  GLUCAP 270* 460* 455*  283* 208*    Recent Results (from the past 240 hour(s))  Blood Culture (routine x 2)     Status: None (Preliminary result)   Collection Time: 03/09/19  2:55 AM   Specimen: Left Antecubital; Blood  Result Value Ref Range Status   Specimen Description LEFT ANTECUBITAL  Final   Special Requests   Final    BOTTLES DRAWN AEROBIC AND ANAEROBIC Blood Culture adequate volume   Culture   Final    NO GROWTH 2 DAYS Performed at [facility], [address], [place], [place] [address]    Report Status PENDING  Incomplete  SARS Coronavirus 2 by RT PCR (hospital order, performed in [place] hospital lab) Nasopharyngeal Nasopharyngeal Swab     Status: Abnormal   Collection Time: 03/09/19  3:35 AM   Specimen: Nasopharyngeal Swab  Result Value Ref Range Status   SARS Coronavirus 2 POSITIVE (A) NEGATIVE Final    Comment: CRITICAL RESULT CALLED TO, READ BACK BY AND VERIFIED WITH: POINDEXTER,M @ 0506 ON 03/09/19 BY JUW Performed at [facility], [address], [place], [place] [address]   Blood Culture (routine x 2)     Status: None (Preliminary result)   Collection Time: 03/09/19  4:18 AM   Specimen: BLOOD  Result Value Ref Range Status   Specimen Description BLOOD RIGHT ANTECUBITAL  Final   Special Requests   Final    BOTTLES DRAWN AEROBIC AND ANAEROBIC Blood Culture adequate volume   Culture   Final    NO GROWTH 2 DAYS Performed at [facility], [address] [place], [place] [address]    Report Status PENDING  Incomplete     Scheduled Meds: . benztropine  1 mg Oral BID  . divalproex  1,000 mg Oral QHS   And  . divalproex  500 mg Oral Daily  . enoxaparin (LOVENOX) injection  40 mg Subcutaneous Daily  . insulin aspart  0-20 Units Subcutaneous TID WC  . insulin aspart  0-5 Units Subcutaneous QHS  . insulin aspart  4 Units Subcutaneous TID WC  . insulin glargine  25 Units Subcutaneous BID  . Ipratropium-Albuterol  2 puff Inhalation Q6H  . lithium carbonate  300 mg Oral Q12H  .  Melatonin  3 mg Oral QHS  . methylPREDNISolone (SOLU-MEDROL) injection  40 mg Intravenous Q12H  . metoprolol tartrate  12.5 mg Oral BID  . risperiDONE  2 mg Oral Daily  . traZODone  50 mg Oral QHS  . umeclidinium bromide  1 puff Inhalation Daily   Continuous Infusions: . sodium chloride 250 mL (03/10/19 1007)  . remdesivir 100 mg in  NS 250 mL Stopped (03/10/19 1039)     LOS: 1 day   Cherene Altes, MD [facility] Office  [phone] Pager - Text Page per Amion  If 7PM-7AM, please contact night-coverage per Amion 03/11/2019, 9:53 AM

## 2019-03-11 NOTE — Progress Notes (Signed)
Inpatient Diabetes Program Recommendations  AACE/ADA: New Consensus Statement on Inpatient Glycemic Control (2015)  Target Ranges:  Prepandial:   less than 140 mg/dL      Peak postprandial:   less than 180 mg/dL (1-2 hours)      Critically ill patients:  140 - 180 mg/dL   Lab Results  Component Value Date   GLUCAP 208 (H) 03/11/2019   HGBA1C 7.0 (H) 03/09/2019    Review of Glycemic Control Results for Scott [name], Scott [name] (MRN [id number]) as of 03/11/2019 09:51  Ref. Range 03/10/2019 08:04 03/10/2019 12:13 03/10/2019 16:21 03/10/2019 22:02 03/11/2019 08:09  Glucose-Capillary Latest Ref Range: 70 - 99 mg/dL 270 (H)  Novolog 5 units  Solumedrol 60 mg  460 (H)  Novolog 30 units     Lantus 25 units given at 1300 455 (H)  Novolog 24 units  Solumedrol 40 mg  283 (H)  Novolog 3 units 208 (H)  Novolog 11 units  Solumedrol 40 mg given at 0546 am   Lantus 25 units    Diabetes history: DM 2 Outpatient Diabetes medications:  Lantus 40 units q HS, Tradjenta 5 mg daily, Metformin 1000 mg bid Current orders for Inpatient glycemic control:  Novolog resistant tid with meals and HS Lantus 25 units bid Novolog 4 units tid meal coverage  Solumedrol 40 mg IV q 12 hours BUN/Creat:  38/1.11 No PO supplementation at this time  Inpatient Diabetes Program Recommendations:    -Consider restarting home dose of Tradjenta 5 mg daily. -Consider increasing Novolog meal coverage to 10 units tid.    Thanks,  Tama Headings RN, MSN, BC-ADM Inpatient Diabetes Coordinator Team Pager [phone] (8a-5p)

## 2019-03-12 LAB — MAGNESIUM: Magnesium: 2 mg/dL (ref 1.7–2.4)

## 2019-03-12 LAB — COMPREHENSIVE METABOLIC PANEL
ALT: 11 U/L (ref 0–44)
AST: 16 U/L (ref 15–41)
Albumin: 2.6 g/dL — ABNORMAL LOW (ref 3.5–5.0)
Alkaline Phosphatase: 68 U/L (ref 38–126)
Anion gap: 13 (ref 5–15)
BUN: 39 mg/dL — ABNORMAL HIGH (ref 6–20)
CO2: 17 mmol/L — ABNORMAL LOW (ref 22–32)
Calcium: 8.4 mg/dL — ABNORMAL LOW (ref 8.9–10.3)
Chloride: 110 mmol/L (ref 98–111)
Creatinine, Ser: 1.01 mg/dL (ref 0.61–1.24)
GFR calc Af Amer: >60 mL/min (ref >60)
GFR calc non Af Amer: >60 mL/min (ref >60)
Glucose, Bld: 169 mg/dL — ABNORMAL HIGH (ref 70–99)
Potassium: 4 mmol/L (ref 3.5–5.1)
Sodium: 140 mmol/L (ref 135–145)
Total Bilirubin: 0.3 mg/dL (ref 0.3–1.2)
Total Protein: 6.2 g/dL — ABNORMAL LOW (ref 6.5–8.1)

## 2019-03-12 LAB — CBC WITH DIFFERENTIAL/PLATELET
Abs Immature Granulocytes: 0.22 10*3/uL — ABNORMAL HIGH (ref 0.00–0.07)
Basophils Absolute: 0 10*3/uL (ref 0.0–0.1)
Basophils Relative: 0 %
Eosinophils Absolute: 0 10*3/uL (ref 0.0–0.5)
Eosinophils Relative: 0 %
HCT: 33.3 % — ABNORMAL LOW (ref 39.0–52.0)
Hemoglobin: 10.7 g/dL — ABNORMAL LOW (ref 13.0–17.0)
Immature Granulocytes: 2 %
Lymphocytes Relative: 23 %
Lymphs Abs: 2.2 10*3/uL (ref 0.7–4.0)
MCH: 31.3 pg (ref 26.0–34.0)
MCHC: 32.1 g/dL (ref 30.0–36.0)
MCV: 97.4 fL (ref 80.0–100.0)
Monocytes Absolute: 0.8 10*3/uL (ref 0.1–1.0)
Monocytes Relative: 8 %
Neutro Abs: 6.4 10*3/uL (ref 1.7–7.7)
Neutrophils Relative %: 67 %
Platelets: 288 10*3/uL (ref 150–400)
RBC: 3.42 MIL/uL — ABNORMAL LOW (ref 4.22–5.81)
RDW: 14 % (ref 11.5–15.5)
WBC: 9.7 10*3/uL (ref 4.0–10.5)
nRBC: 0 % (ref 0.0–0.2)

## 2019-03-12 LAB — D-DIMER, QUANTITATIVE: D-Dimer, Quant: 0.47 ug/mL-FEU (ref 0.00–0.50)

## 2019-03-12 LAB — GLUCOSE, CAPILLARY
Glucose-Capillary: 281 mg/dL — ABNORMAL HIGH (ref 70–99)
Glucose-Capillary: 105 mg/dL — ABNORMAL HIGH (ref 70–99)
Glucose-Capillary: 424 mg/dL — ABNORMAL HIGH (ref 70–99)
Glucose-Capillary: 360 mg/dL — ABNORMAL HIGH (ref 70–99)

## 2019-03-12 LAB — PROCALCITONIN: Procalcitonin: <0.1 ng/mL

## 2019-03-12 LAB — C-REACTIVE PROTEIN: CRP: 1.9 mg/dL — ABNORMAL HIGH (ref <1.0)

## 2019-03-12 MED ORDER — INSULIN ASPART 100 UNIT/ML ~~LOC~~ SOLN
15.0000 [IU] | Freq: Three times a day (TID) | SUBCUTANEOUS | Status: DC
  Administered 2019-03-12 – 2019-03-13 (×3): 15 [IU] via SUBCUTANEOUS

## 2019-03-12 MED ORDER — DEXAMETHASONE 6 MG PO TABS
6.0000 mg | ORAL_TABLET | Freq: Every day | ORAL | Status: DC
  Administered 2019-03-12 – 2019-03-13 (×2): 6 mg via ORAL
  Filled 2019-03-12 (×2): qty 1

## 2019-03-12 NOTE — Progress Notes (Signed)
Scott [name]  [id number] DOB: [birth year] DOA: 03/09/2019 PCP: Rosita Fire, MD    Brief Narrative:  58 year old with a history of osteoarthritis, bipolar 1 disorder, depression, DM 2, and traumatic brain injury who was [facility] from his [facility]F with fever, sore throat, and shortness of breath for a few days.  He was diagnosed with Covid pneumonia and transferred to [facility].  COVID-19 specific Treatment: Remdesivir 11/30 > 12/4 Solu-Medrol 11/30 >  Subjective: Resting comfortably in a bedside chair.  Very pleasant and calm.  Denies any complaints.  Reports that he is ready to get back to his normal living facility.  Assessment & Plan:  Covid pneumonia -acute hypoxic respiratory failure Continue remdesivir and steroids - appears relatively stable for now  Recent Labs  Lab 03/09/19 0255 03/09/19 0422 03/10/19 0239 03/11/19 0105 03/12/19 0105  DDIMER 0.49  --  0.44 0.40 0.47  FERRITIN 221  --   --   --   --   CRP 9.6*  --  10.1* 5.5* 1.9*  ALT 13 13 9 13 11   PROCALCITON 0.24  --  0.10 <0.10 <0.10    DM2 uncontrolled w/ Hyperglycemia CBG improved with significant adjustments in insulin dosing yesterday -continue to monitor with ongoing steroid dosing and frequently adjusting dose further as required  Traumatic brain injury At baseline is able to answer basic questions and perform basic tasks -lives in a supportive facility -appears to be at his baseline presently  HTN Stable on beta-blocker  History of seizure Continue Depakote  Bipolar disorder Continue usual home medications  Hyperkalemia ACE inhibitor discontinued -required transient dosing of Kayexalate   DVT prophylaxis: Lovenox Code Status: FULL CODE Family Communication:  Disposition Plan: med/surg - ambulate - complete course of remdesivir -possible return to ALF 12/4  Consultants:  none  Antimicrobials:  None  Objective: Blood pressure 120/76, pulse 88, temperature 97.9 F (36.6  C), temperature source Oral, resp. rate 20, height 6\' 2"  (1.88 m), weight 98 kg, SpO2 92 %.  Intake/Output Summary (Last 24 hours) at 03/12/2019 0957 Last data filed at 03/12/2019 0914 Gross per 24 hour  Intake 880 ml  Output 1100 ml  Net -220 ml   Filed Weights   03/09/19 0236  Weight: 98 kg    Examination: General: No acute respiratory distress Lungs: Scattered fine crackles throughout without change Cardiovascular: RRR without murmur or rub Abdomen: NT/ND, soft, BS positive Extremities: No edema B LE  CBC: Recent Labs  Lab 03/10/19 0239 03/11/19 0105 03/12/19 0105  WBC 4.5 8.7 9.7  NEUTROABS 3.1 6.8 6.4  HGB 11.6* 10.8* 10.7*  HCT 36.2* 34.2* 33.3*  MCV 96.0 97.4 97.4  PLT 250 258 [id number]   Basic Metabolic Panel: Recent Labs  Lab 03/09/19 0417  03/10/19 0239 03/11/19 0105 03/12/19 0105  NA  --    < > 138 138 140  K  --    < > 5.4* 4.0 4.0  CL  --    < > 112* 110 110  CO2  --    < > 16* 16* 17*  GLUCOSE  --    < > 343* 290* 169*  BUN  --    < > 32* 38* 39*  CREATININE  --    < > 1.00 1.11 1.01  CALCIUM  --    < > 8.8* 8.2* 8.4*  MG 1.8  --  2.4 2.2 2.0  PHOS 2.7  --   --   --   --    < > =  values in this interval not displayed.   GFR: Estimated Creatinine Clearance: 93.8 mL/min (by C-G formula based on SCr of 1.01 mg/dL).  Liver Function Tests: Recent Labs  Lab 03/09/19 0422 03/10/19 0239 03/11/19 0105 03/12/19 0105  AST 27 25 18 16   ALT 13 9 13 11   ALKPHOS 91 82 73 68  BILITOT 0.4 1.0 0.2* 0.3  PROT 7.2 6.9 6.5 6.2*  ALBUMIN 3.1* 3.0* 2.6* 2.6*    HbA1C: Hemoglobin A1C  Date/Time Value Ref Range Status  11/19/2018 9.4  Final   Hgb A1c MFr Bld  Date/Time Value Ref Range Status  03/09/2019 02:55 AM 7.0 (H) 4.8 - 5.6 % Final    Comment:    (NOTE) Pre diabetes:          5.7%-6.4% Diabetes:              >6.4% Glycemic control for   <7.0% adults with diabetes   04/27/2015 09:34 PM 6.3 (H) 4.0 - 6.0 % Final    CBG: Recent Labs  Lab  03/11/19 0809 03/11/19 1140 03/11/19 1642 03/11/19 2127 03/12/19 0733  GLUCAP 208* 416* 279* 281* 105*    Recent Results (from the past 240 hour(s))  Blood Culture (routine x 2)     Status: None (Preliminary result)   Collection Time: 03/09/19  2:55 AM   Specimen: Left Antecubital; Blood  Result Value Ref Range Status   Specimen Description LEFT ANTECUBITAL  Final   Special Requests   Final    BOTTLES DRAWN AEROBIC AND ANAEROBIC Blood Culture adequate volume   Culture   Final    NO GROWTH 3 DAYS Performed at [facility], [address], [place], [place] [address]    Report Status PENDING  Incomplete  SARS Coronavirus 2 by RT PCR (hospital order, performed in [place] hospital lab) Nasopharyngeal Nasopharyngeal Swab     Status: Abnormal   Collection Time: 03/09/19  3:35 AM   Specimen: Nasopharyngeal Swab  Result Value Ref Range Status   SARS Coronavirus 2 POSITIVE (A) NEGATIVE Final    Comment: CRITICAL RESULT CALLED TO, READ BACK BY AND VERIFIED WITH: POINDEXTER,M @ 0506 ON 03/09/19 BY JUW Performed at [facility], [address] [place], [place] [address]   Blood Culture (routine x 2)     Status: None (Preliminary result)   Collection Time: 03/09/19  4:18 AM   Specimen: BLOOD  Result Value Ref Range Status   Specimen Description BLOOD RIGHT ANTECUBITAL  Final   Special Requests   Final    BOTTLES DRAWN AEROBIC AND ANAEROBIC Blood Culture adequate volume   Culture   Final    NO GROWTH 3 DAYS Performed at [facility], [address], [place], [place] [address]    Report Status PENDING  Incomplete     Scheduled Meds: . benztropine  1 mg Oral BID  . divalproex  1,000 mg Oral QHS   And  . divalproex  500 mg Oral Daily  . enoxaparin (LOVENOX) injection  40 mg Subcutaneous Daily  . insulin aspart  0-20 Units Subcutaneous TID WC  . insulin aspart  0-5 Units Subcutaneous QHS  . insulin aspart  10 Units Subcutaneous TID WC  . insulin glargine  32 Units Subcutaneous  BID  . Ipratropium-Albuterol  2 puff Inhalation Q6H  . linagliptin  5 mg Oral Daily  . lithium carbonate  300 mg Oral Q12H  . Melatonin  3 mg Oral QHS  . methylPREDNISolone (SOLU-MEDROL) injection  40 mg Intravenous  Q12H  . metoprolol tartrate  12.5 mg Oral BID  . risperiDONE  2 mg Oral Daily  . traZODone  50 mg Oral QHS  . umeclidinium bromide  1 puff Inhalation Daily   Continuous Infusions: . sodium chloride 250 mL (03/10/19 1007)  . remdesivir 100 mg in NS 250 mL 100 mg (03/12/19 0936)     LOS: 2 days   Cherene Altes, MD [facility] Office  [phone] Pager - Text Page per Amion  If 7PM-7AM, please contact night-coverage per Amion 03/12/2019, 9:57 AM

## 2019-03-12 NOTE — Plan of Care (Signed)
Pt updated on POC, verbalized understanding

## 2019-03-12 NOTE — Progress Notes (Signed)
Spoke with MD Thereasa Solo about BG 424, MD stated to give 20 for SSI and the scheduled 10, 30 of insulin in total.  No other orders at this time. 03/12/2019 [place], RN

## 2019-03-12 NOTE — Progress Notes (Signed)
Physical Therapy Treatment Patient Details Name: Scott [name] MRN: [id number] DOB: January 16, 1961 Today's Date: 03/12/2019    History of Present Illness 58 y.o. male with medical history significant of osteoarthritis, bipolar 1 disorder, depression, type 2 diabetes, MR/traumatic brain injury, history of pneumonia who is sent from [place] home for evaluation of fever, sore throat and shortness of breath for a few days.  He presented to [facility] ER from where he was sent to [facility] for COVID-19 pneumonia.    PT Comments    The patient ambulated x  250' using RW on RA with SPO2 > 92%. RN notified, left patient on RA in room at 95%. Plans to return to his group home tomorrow.  Follow Up Recommendations  Home health PT     Equipment Recommendations  Rolling walker with 5" wheels(may need initially, does facility have one?)    Recommendations for Other Services       Precautions / Restrictions Precautions Precautions: Fall    Mobility  Bed Mobility               General bed mobility comments: OOB  Transfers   Equipment used: Rolling walker (2 wheeled)   Sit to Stand: Supervision            Ambulation/Gait Ambulation/Gait assistance: Min guard Gait Distance (Feet): 250 Feet Assistive device: Rolling walker (2 wheeled) Gait Pattern/deviations: Step-through pattern     General Gait Details: pt had no balance loss, had his shoes on   Stairs             Wheelchair Mobility    Modified Rankin (Stroke Patients Only)       Balance Overall balance assessment: Needs assistance   Sitting balance-Leahy Scale: Good     Standing balance support: Bilateral upper extremity supported;During functional activity Standing balance-Leahy Scale: Fair                              Cognition Arousal/Alertness: Awake/alert Behavior During Therapy: WFL for tasks assessed/performed Overall Cognitive Status: History of cognitive  impairments - at baseline                                        Exercises      General Comments        Pertinent Vitals/Pain      Home Living                      Prior Function            PT Goals (current goals can now be found in the care plan section) Progress towards PT goals: Progressing toward goals    Frequency    Min 3X/week      PT Plan Current plan remains appropriate    Co-evaluation              AM-PAC PT "6 Clicks" Mobility   Outcome Measure  Help needed turning from your back to your side while in a flat bed without using bedrails?: None Help needed moving from lying on your back to sitting on the side of a flat bed without using bedrails?: None Help needed moving to and from a bed to a chair (including a wheelchair)?: A Little Help needed standing up from a chair using your arms (e.g., wheelchair  or bedside chair)?: A Little Help needed to walk in hospital room?: A Little Help needed climbing 3-5 steps with a railing? : A Lot 6 Click Score: 19    End of Session Equipment Utilized During Treatment: Gait belt Activity Tolerance: Patient tolerated treatment well Patient left: in chair;with call bell/phone within reach;with chair alarm set Nurse Communication: Mobility status       Time: [id number] PT Time Calculation (min) (ACUTE ONLY): 17 min  Charges:  $Gait Training: 8-22 mins                     [place]  Office [phone]    Claretha Cooper 03/12/2019, 4:00 PM

## 2019-03-12 NOTE — Progress Notes (Signed)
Inpatient Diabetes Program Recommendations  AACE/ADA: New Consensus Statement on Inpatient Glycemic Control (2015)  Target Ranges:  Prepandial:   less than 140 mg/dL      Peak postprandial:   less than 180 mg/dL (1-2 hours)      Critically ill patients:  140 - 180 mg/dL   Lab Results  Component Value Date   GLUCAP 105 (H) 03/12/2019   HGBA1C 7.0 (H) 03/09/2019    Review of Glycemic Control  Results for Scott [name], Scott [name] (MRN [id number]) as of 03/12/2019 13:46  Ref. Range 03/11/2019 08:09 03/11/2019 11:40 03/11/2019 16:42 03/11/2019 21:27 03/12/2019 07:33  Glucose-Capillary Latest Ref Range: 70 - 99 mg/dL 208 (H) 416 (H) 279 (H) 281 (H) 105 (H)    Diabetes history: DM 2 Outpatient Diabetes medications:  Lantus 40 units q HS, Tradjenta 5 mg daily, Metformin 1000 mg bid Current orders for Inpatient glycemic control:  Novolog resistant tid with meals and HS Lantus 32 units bid Novolog 10 units tid meal coverage  Solumedrol 40 mg IV q 12 hours BUN/Creat:  39/1.01 No PO supplementation at this time  Inpatient Diabetes Program Recommendations:      Noted glucose increased from 105 up into the 400 range at lunchtime with Novolog 10 meal coverage.  -Consider increasing Novolog meal coverage to 15 units tid.    Thanks,  Tama Headings RN, MSN, BC-ADM Inpatient Diabetes Coordinator Team Pager [phone] (8a-5p)
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
## 2019-03-13 ENCOUNTER — Inpatient Hospital Stay (HOSPITAL_COMMUNITY): Payer: Medicare Other

## 2019-03-13 LAB — CBC WITH DIFFERENTIAL/PLATELET
Abs Immature Granulocytes: 0.38 10*3/uL — ABNORMAL HIGH (ref 0.00–0.07)
Basophils Absolute: 0.1 10*3/uL (ref 0.0–0.1)
Basophils Relative: 1 %
Eosinophils Absolute: 0 10*3/uL (ref 0.0–0.5)
Eosinophils Relative: 0 %
HCT: 33.2 % — ABNORMAL LOW (ref 39.0–52.0)
Hemoglobin: 10.7 g/dL — ABNORMAL LOW (ref 13.0–17.0)
Immature Granulocytes: 5 %
Lymphocytes Relative: 19 %
Lymphs Abs: 1.6 10*3/uL (ref 0.7–4.0)
MCH: 30.7 pg (ref 26.0–34.0)
MCHC: 32.2 g/dL (ref 30.0–36.0)
MCV: 95.1 fL (ref 80.0–100.0)
Monocytes Absolute: 0.9 10*3/uL (ref 0.1–1.0)
Monocytes Relative: 10 %
Neutro Abs: 5.5 10*3/uL (ref 1.7–7.7)
Neutrophils Relative %: 65 %
Platelets: 294 10*3/uL (ref 150–400)
RBC: 3.49 MIL/uL — ABNORMAL LOW (ref 4.22–5.81)
RDW: 13.9 % (ref 11.5–15.5)
WBC: 8.4 10*3/uL (ref 4.0–10.5)
nRBC: 0 % (ref 0.0–0.2)

## 2019-03-13 LAB — COMPREHENSIVE METABOLIC PANEL
ALT: 12 U/L (ref 0–44)
AST: 19 U/L (ref 15–41)
Albumin: 2.5 g/dL — ABNORMAL LOW (ref 3.5–5.0)
Alkaline Phosphatase: 66 U/L (ref 38–126)
Anion gap: 12 (ref 5–15)
BUN: 36 mg/dL — ABNORMAL HIGH (ref 6–20)
CO2: 18 mmol/L — ABNORMAL LOW (ref 22–32)
Calcium: 8.4 mg/dL — ABNORMAL LOW (ref 8.9–10.3)
Chloride: 111 mmol/L (ref 98–111)
Creatinine, Ser: 1.14 mg/dL (ref 0.61–1.24)
GFR calc Af Amer: >60 mL/min (ref >60)
GFR calc non Af Amer: >60 mL/min (ref >60)
Glucose, Bld: 187 mg/dL — ABNORMAL HIGH (ref 70–99)
Potassium: 4.2 mmol/L (ref 3.5–5.1)
Sodium: 141 mmol/L (ref 135–145)
Total Bilirubin: 0.5 mg/dL (ref 0.3–1.2)
Total Protein: 6.2 g/dL — ABNORMAL LOW (ref 6.5–8.1)

## 2019-03-13 LAB — MAGNESIUM: Magnesium: 2 mg/dL (ref 1.7–2.4)

## 2019-03-13 LAB — C-REACTIVE PROTEIN: CRP: 1.8 mg/dL — ABNORMAL HIGH (ref <1.0)

## 2019-03-13 LAB — GLUCOSE, CAPILLARY
Glucose-Capillary: 192 mg/dL — ABNORMAL HIGH (ref 70–99)
Glucose-Capillary: 117 mg/dL — ABNORMAL HIGH (ref 70–99)
Glucose-Capillary: 283 mg/dL — ABNORMAL HIGH (ref 70–99)
Glucose-Capillary: 231 mg/dL — ABNORMAL HIGH (ref 70–99)

## 2019-03-13 LAB — D-DIMER, QUANTITATIVE: D-Dimer, Quant: 0.45 ug/mL-FEU (ref 0.00–0.50)

## 2019-03-13 MED ORDER — ACETAMINOPHEN 325 MG PO TABS: 650.0000 mg | ORAL_TABLET | Freq: Four times a day (QID) | ORAL | Status: DC | PRN

## 2019-03-13 MED ORDER — DEXAMETHASONE 6 MG PO TABS: 6.0000 mg | ORAL_TABLET | Freq: Every day | ORAL | 0 refills | Status: AC
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
## 2019-03-13 NOTE — Plan of Care (Signed)

## 2019-03-13 NOTE — TOC Transition Note (Signed)
Transition of Care [facility]) - CM/SW Discharge Note   Patient Details  Name: Scott [name] MRN: [id number] Date of Birth: 1960-08-15  Transition of Care [facility]) CM/SW Contact:  Geralynn Ochs, LCSW Phone Number: 03/13/2019, 1:35 PM   Clinical Narrative:   Nurse to call report to [phone]    Final next level of care: Assisted Living Barriers to Discharge: Barriers Resolved   Patient Goals and CMS Choice Patient states their goals for this hospitalization and ongoing recovery are:: patient unable to participate in goal setting due to confusion; guardian hopeful that patient will recover and return to [place] CMS Medicare.gov Compare Post Acute Care list provided to:: Legal Guardian Choice offered to / list presented to : [place] / [place]  Discharge Placement              Patient chooses bed at: [facility]) Patient to be transferred to facility by: [place] Name of family member notified: Attempted to call legal guardian, phone wouldn't pick up after multiple attempts Patient and family notified of of transfer: 03/13/19  Discharge Plan and Services     Post Acute Care Choice: Home Health                    HH Arranged: PT, OT, RN [facility] Agency: [facility] Date [place]: 03/13/19 Time [place]: [id number] Representative spoke with at [place]: Cassie  Social Determinants of Health ([place]) Interventions     Readmission Risk Interventions No flowsheet data found.

## 2019-03-13 NOTE — Plan of Care (Signed)
Updated pt on POC, pt verbalized understanding, will need reinforcement

## 2019-03-13 NOTE — Discharge Summary (Signed)
DISCHARGE SUMMARY  Scott [name]  MR#: [id number]  DOB:1961/01/11  Date of Admission: 03/09/2019 Date of Discharge: 03/13/2019  Attending Physician:Jeffrey Hennie Duos, MD  Patient's UD:[id number], Scott Melnick, MD  Consults: none  Disposition: D/C to his group home / supportive environment   Date of Positive COVID Test: 11/30  Date Quarantine Ends: 12/14  COVID-19 specific Treatment: Remdesivir 11/30 > 12/4 Solu-Medrol 11/30 > 12/9  Follow-up Appts: Follow-up Information    Rosita Fire, MD Follow up in 1 week(s).   Specialty: Internal Medicine Contact information: [place] [address] [phone]          Follow-up Issues: -f/u CBG control -f/u BP - ACEi stopped due to hyperkalemia   Discharge Diagnoses: Covid Pneumonia Acute hypoxic respiratory failure DM2 uncontrolled w/ Hyperglycemia Traumatic brain injury HTN History of seizure Bipolar disorder Hyperkalemia  Initial presentation: 58 year old with a history of osteoarthritis, bipolar 1 disorder, depression, DM 2, and traumatic brain injury who was sent to [facility] ED from his ALF with fever, sore throat, and shortness of breath for a few days.  He was diagnosed with COVID pneumonia and transferred to [facility].  Hospital Course:  Covid pneumonia -acute hypoxic respiratory failure Completed a course of remdesivir - to complete a 10 day course of steroid tx - stable at time of d/c w/ normal sats on RA  DM2 uncontrolled w/ Hyperglycemia CBG improved with significant adjustments in insulin dosing required during this hospital stay - continue to monitor with ongoing steroid dosing - further titration of DM meds as outpt may be required   Traumatic brain injury At baseline is able to answer basic questions and perform basic tasks -lives in a supportive facility -appears to be at his baseline presently  HTN Stable on beta-blocker - ACEi stopped due to hyperkalemia   History  of seizure Continue Depakote - no seizure activity during this admit   Bipolar disorder Continue usual home medications  Hyperkalemia ACE inhibitor discontinued - required transient dosing of Kayexalate - K+ stable at time of d/c   Allergies as of 03/13/2019   No Known Allergies     Medication List    STOP taking these medications   ipratropium 0.02 % nebulizer solution Commonly known as: ATROVENT   lisinopril 5 MG tablet Commonly known as: ZESTRIL     TAKE these medications   acetaminophen 325 MG tablet Commonly known as: TYLENOL Take 2 tablets (650 mg total) by mouth every 6 (six) hours as needed for mild pain (or Fever >/= 101). What changed:   medication strength  how much to take  when to take this  reasons to take this   albuterol 108 (90 Base) MCG/ACT inhaler Commonly known as: VENTOLIN HFA Inhale 2 puffs into the lungs every 4 (four) hours as needed for wheezing or shortness of breath. What changed: Another medication with the same name was removed. Continue taking this medication, and follow the directions you see here.   benztropine 1 MG tablet Commonly known as: COGENTIN Take 1 mg by mouth 2 (two) times daily.   dexamethasone 6 MG tablet Commonly known as: DECADRON Take 1 tablet (6 mg total) by mouth daily for 5 days. Start taking on: March 14, 2019   divalproex 500 MG DR tablet Commonly known as: DEPAKOTE Take 1 tablet (500 mg total) by mouth 2 (two) times daily. What changed:   how much to take  additional instructions   gemfibrozil 600 MG tablet Commonly known as: LOPID  Take 600 mg by mouth 2 (two) times daily.   Lantus SoloStar 100 UNIT/ML Solostar Pen Generic drug: Insulin Glargine Inject 40 Units into the skin at bedtime.   linagliptin 5 MG Tabs tablet Commonly known as: Tradjenta Take 1 tablet (5 mg total) by mouth daily.   lithium carbonate 300 MG CR tablet Commonly known as: LITHOBID Take 1 tablet (300 mg total) by mouth  every 12 (twelve) hours.   Melatonin 3 MG Tabs Take 3 mg by mouth at bedtime. ODT   metFORMIN 1000 MG tablet Commonly known as: GLUCOPHAGE Take 1,000 mg by mouth 2 (two) times daily.   metoprolol tartrate 25 MG tablet Commonly known as: LOPRESSOR Take 0.5 tablets (12.5 mg total) by mouth 2 (two) times daily.   risperiDONE 2 MG tablet Commonly known as: RISPERDAL Take 1 tablet (2 mg total) by mouth 2 (two) times daily. What changed: when to take this   tiotropium 18 MCG inhalation capsule Commonly known as: SPIRIVA Place 1 capsule (18 mcg total) into inhaler and inhale daily.   traZODone 50 MG tablet Commonly known as: DESYREL Take 50 mg by mouth at bedtime.   triamcinolone ointment 0.5 % Commonly known as: KENALOG Apply 1 application topically 2 (two) times daily. What changed:   when to take this  reasons to take this       Day of Discharge BP 130/78   Pulse 73   Temp (!) 97.3 F (36.3 C) (Oral)   Resp 16   Ht 6\' 2"  (1.88 m)   Wt 98 kg   SpO2 95%   BMI 27.73 kg/m   Physical Exam: General: No acute respiratory distress Lungs: Clear to auscultation bilaterally without wheezes or crackles Cardiovascular: Regular rate and rhythm without murmur gallop or rub normal S1 and S2 Abdomen: Nontender, nondistended, soft, bowel sounds positive, no rebound, no ascites, no appreciable mass Extremities: No significant cyanosis, clubbing, or edema bilateral lower extremities  Basic Metabolic Panel: Recent Labs  Lab 03/09/19 0417 03/09/19 0422 03/10/19 0239 03/11/19 0105 03/12/19 0105 03/13/19 0052  NA  --  137 138 138 140 141  K  --  4.7 5.4* 4.0 4.0 4.2  CL  --  108 112* 110 110 111  CO2  --  18* 16* 16* 17* 18*  GLUCOSE  --  119* 343* 290* 169* 187*  BUN  --  17 32* 38* 39* 36*  CREATININE  --  1.02 1.00 1.11 1.01 1.14  CALCIUM  --  8.9 8.8* 8.2* 8.4* 8.4*  MG 1.8  --  2.4 2.2 2.0 2.0  PHOS 2.7  --   --   --   --   --     Liver Function Tests: Recent  Labs  Lab 03/09/19 0422 03/10/19 0239 03/11/19 0105 03/12/19 0105 03/13/19 0052  AST 27 25 18 16 19   ALT 13 9 13 11 12   ALKPHOS 91 82 73 68 66  BILITOT 0.4 1.0 0.2* 0.3 0.5  PROT 7.2 6.9 6.5 6.2* 6.2*  ALBUMIN 3.1* 3.0* 2.6* 2.6* 2.5*    CBC: Recent Labs  Lab 03/09/19 0255 03/09/19 0422 03/10/19 0239 03/11/19 0105 03/12/19 0105 03/13/19 0052  WBC 5.5 5.6 4.5 8.7 9.7 8.4  NEUTROABS 3.3  --  3.1 6.8 6.4 5.5  HGB 12.1* 11.9* 11.6* 10.8* 10.7* 10.7*  HCT 38.1* 37.7* 36.2* 34.2* 33.3* 33.2*  MCV 97.4 97.4 96.0 97.4 97.4 95.1  PLT 227 234 250 258 288 294    CBG: Recent Labs  Lab 03/12/19 1156 03/12/19 1627 03/12/19 2146 03/13/19 0827 03/13/19 1134  GLUCAP 424* 360* 192* 117* 283*    Recent Results (from the past 240 hour(s))  Blood Culture (routine x 2)     Status: None (Preliminary result)   Collection Time: 03/09/19  2:55 AM   Specimen: Left Antecubital; Blood  Result Value Ref Range Status   Specimen Description LEFT ANTECUBITAL  Final   Special Requests   Final    BOTTLES DRAWN AEROBIC AND ANAEROBIC Blood Culture adequate volume   Culture   Final    NO GROWTH 4 DAYS Performed at [facility], [address], [place], [place] [address]    Report Status PENDING  Incomplete  SARS Coronavirus 2 by RT PCR (hospital order, performed in [place] hospital lab) Nasopharyngeal Nasopharyngeal Swab     Status: Abnormal   Collection Time: 03/09/19  3:35 AM   Specimen: Nasopharyngeal Swab  Result Value Ref Range Status   SARS Coronavirus 2 POSITIVE (A) NEGATIVE Final    Comment: CRITICAL RESULT CALLED TO, READ BACK BY AND VERIFIED WITH: POINDEXTER,M @ 0506 ON 03/09/19 BY JUW Performed at [facility], [address] [place], [place] [address]   Blood Culture (routine x 2)     Status: None (Preliminary result)   Collection Time: 03/09/19  4:18 AM   Specimen: BLOOD  Result Value Ref Range Status   Specimen Description BLOOD RIGHT ANTECUBITAL  Final   Special  Requests   Final    BOTTLES DRAWN AEROBIC AND ANAEROBIC Blood Culture adequate volume   Culture   Final    NO GROWTH 4 DAYS Performed at [facility], [address], [place], [place] [address]    Report Status PENDING  Incomplete     Time spent in discharge (includes decision making & examination of pt): 35 minutes  03/13/2019, 12:18 PM   Cherene Altes, MD [facility] Office  [phone]

## 2019-03-13 NOTE — Progress Notes (Addendum)
Spoke with Tammy, Mudlogger at [employer]. No further questions. P-tar called for pick up, PIV removed. Will continue to monitor. RA @ 95%. No signs of respiratory distress. D/c 1637

## 2019-03-13 NOTE — Care Management Important Message (Signed)
Important Message  Patient Details  Name: MATTIAS PATNODE MRN: [id number] Date of Birth: 09-30-1960   Medicare Important Message Given:  Yes - Important Message mailed due to current National Emergency  Verbal consent obtained due to current National Emergency  Relationship to patient: [place] Name: Foy [place] Call Date: 03/13/19  Time: 1304 Phone: [phone] Outcome: No Answer/Busy Important Message mailed to: Patient address on file    Delorse Lek 03/13/2019, 1:04 PM

## 2019-03-13 NOTE — Discharge Instructions (Signed)
Date of Positive COVID Test: 11/30  Date Quarantine Ends: 12/14    COVID-19 COVID-19 is a respiratory infection that is caused by a virus called severe acute respiratory syndrome coronavirus 2 (SARS-CoV-2). The disease is also known as coronavirus disease or novel coronavirus. In some people, the virus may not cause any symptoms. In others, it may cause a serious infection. The infection can get worse quickly and can lead to complications, such as:  Pneumonia, or infection of the lungs.  Acute respiratory distress syndrome or ARDS. This is fluid build-up in the lungs.  Acute respiratory failure. This is a condition in which there is not enough oxygen passing from the lungs to the body.  Sepsis or septic shock. This is a serious bodily reaction to an infection.  Blood clotting problems.  Secondary infections due to bacteria or fungus. The virus that causes COVID-19 is contagious. This means that it can spread from person to person through droplets from coughs and sneezes (respiratory secretions). What are the causes? This illness is caused by a virus. You may catch the virus by:  Breathing in droplets from an infected person's cough or sneeze.  Touching something, like a table or a doorknob, that was exposed to the virus (contaminated) and then touching your mouth, nose, or eyes. What increases the risk? Risk for infection You are more likely to be infected with this virus if you:  Live in or travel to an area with a COVID-19 outbreak.  Come in contact with a sick person who recently traveled to an area with a COVID-19 outbreak.  Provide care for or live with a person who is infected with COVID-19. Risk for serious illness You are more likely to become seriously ill from the virus if you:  Are 12 years of age or older.  Have a long-term disease that lowers your body's ability to fight infection (immunocompromised).  Live in a nursing home or long-term care  facility.  Have a long-term (chronic) disease such as: ? Chronic lung disease, including chronic obstructive pulmonary disease or asthma ? Heart disease. ? Diabetes. ? Chronic kidney disease. ? Liver disease.  Are obese. What are the signs or symptoms? Symptoms of this condition can range from mild to severe. Symptoms may appear any time from 2 to 14 days after being exposed to the virus. They include:  A fever.  A cough.  Difficulty breathing.  Chills.  Muscle pains.  A sore throat.  Loss of taste or smell. Some people may also have stomach problems, such as nausea, vomiting, or diarrhea. Other people may not have any symptoms of COVID-19. How is this diagnosed? This condition may be diagnosed based on:  Your signs and symptoms, especially if: ? You live in an area with a COVID-19 outbreak. ? You recently traveled to or from an area where the virus is common. ? You provide care for or live with a person who was diagnosed with COVID-19.  A physical exam.  Lab tests, which may include: ? A nasal swab to take a sample of fluid from your nose. ? A throat swab to take a sample of fluid from your throat. ? A sample of mucus from your lungs (sputum). ? Blood tests.  Imaging tests, which may include, X-rays, CT scan, or ultrasound. How is this treated? At present, there is no medicine to treat COVID-19. Medicines that treat other diseases are being used on a trial basis to see if they are effective against COVID-19. Your health  care provider will talk with you about ways to treat your symptoms. For most people, the infection is mild and can be managed at home with rest, fluids, and over-the-counter medicines. Treatment for a serious infection usually takes places in a hospital intensive care unit (ICU). It may include one or more of the following treatments. These treatments are given until your symptoms improve.  Receiving fluids and medicines through an  IV.  Supplemental oxygen. Extra oxygen is given through a tube in the nose, a face mask, or a hood.  Positioning you to lie on your stomach (prone position). This makes it easier for oxygen to get into the lungs.  Continuous positive airway pressure (CPAP) or bi-level positive airway pressure (BPAP) machine. This treatment uses mild air pressure to keep the airways open. A tube that is connected to a motor delivers oxygen to the body.  Ventilator. This treatment moves air into and out of the lungs by using a tube that is placed in your windpipe.  Tracheostomy. This is a procedure to create a hole in the neck so that a breathing tube can be inserted.  Extracorporeal membrane oxygenation (ECMO). This procedure gives the lungs a chance to recover by taking over the functions of the heart and lungs. It supplies oxygen to the body and removes carbon dioxide. Follow these instructions at home: Lifestyle  If you are sick, stay home except to get medical care. Your health care provider will tell you how long to stay home. Call your health care provider before you go for medical care.  Rest at home as told by your health care provider.  Do not use any products that contain nicotine or tobacco, such as cigarettes, e-cigarettes, and chewing tobacco. If you need help quitting, ask your health care provider.  Return to your normal activities as told by your health care provider. Ask your health care provider what activities are safe for you. General instructions  Take over-the-counter and prescription medicines only as told by your health care provider.  Drink enough fluid to keep your urine pale yellow.  Keep all follow-up visits as told by your health care provider. This is important. How is this prevented?  There is no vaccine to help prevent COVID-19 infection. However, there are steps you can take to protect yourself and others from this virus. To protect yourself:   Do not travel to areas  where COVID-19 is a risk. The areas where COVID-19 is reported change often. To identify high-risk areas and travel restrictions, check the CDC travel website: FatFares.com.br  If you live in, or must travel to, an area where COVID-19 is a risk, take precautions to avoid infection. ? Stay away from people who are sick. ? Wash your hands often with soap and water for 20 seconds. If soap and water are not available, use an alcohol-based hand sanitizer. ? Avoid touching your mouth, face, eyes, or nose. ? Avoid going out in public, follow guidance from your state and local health authorities. ? If you must go out in public, wear a cloth face covering or face mask. ? Disinfect objects and surfaces that are frequently touched every day. This may include:  Counters and tables.  Doorknobs and light switches.  Sinks and faucets.  Electronics, such as phones, remote controls, keyboards, computers, and tablets. To protect others: If you have symptoms of COVID-19, take steps to prevent the virus from spreading to others.  If you think you have a COVID-19 infection, contact your health  care provider right away. Tell your health care team that you think you may have a COVID-19 infection.  Stay home. Leave your house only to seek medical care. Do not use public transport.  Do not travel while you are sick.  Wash your hands often with soap and water for 20 seconds. If soap and water are not available, use alcohol-based hand sanitizer.  Stay away from other members of your household. Let healthy household members care for children and pets, if possible. If you have to care for children or pets, wash your hands often and wear a mask. If possible, stay in your own room, separate from others. Use a different bathroom.  Make sure that all people in your household wash their hands well and often.  Cough or sneeze into a tissue or your sleeve or elbow. Do not cough or sneeze into your hand or  into the air.  Wear a cloth face covering or face mask. Where to find more information  Centers for Disease Control and Prevention: PurpleGadgets.be  World Health Organization: https://www.castaneda.info/ Contact a health care provider if:  You live in or have traveled to an area where COVID-19 is a risk and you have symptoms of the infection.  You have had contact with someone who has COVID-19 and you have symptoms of the infection. Get help right away if:  You have trouble breathing.  You have pain or pressure in your chest.  You have confusion.  You have bluish lips and fingernails.  You have difficulty waking from sleep.  You have symptoms that get worse. These symptoms may represent a serious problem that is an emergency. Do not wait to see if the symptoms will go away. Get medical help right away. Call your local emergency services (911 in the [place] Do not drive yourself to the hospital. Let the emergency medical personnel know if you think you have COVID-19. Summary  COVID-19 is a respiratory infection that is caused by a virus. It is also known as coronavirus disease or novel coronavirus. It can cause serious infections, such as pneumonia, acute respiratory distress syndrome, acute respiratory failure, or sepsis.  The virus that causes COVID-19 is contagious. This means that it can spread from person to person through droplets from coughs and sneezes.  You are more likely to develop a serious illness if you are 44 years of age or older, have a weak immunity, live in a nursing home, or have chronic disease.  There is no medicine to treat COVID-19. Your health care provider will talk with you about ways to treat your symptoms.  Take steps to protect yourself and others from infection. Wash your hands often and disinfect objects and surfaces that are frequently touched every day. Stay away from people who are sick and wear a mask if you  are sick. This information is not intended to replace advice given to you by your health care provider. Make sure you discuss any questions you have with your health care provider. Document Released: 05/01/2018 Document Revised: 08/21/2018 Document Reviewed: 05/01/2018 Elsevier Patient Education  2020 [place]: How to Protect Yourself and Others Know how it spreads  There is currently no vaccine to prevent coronavirus disease 2019 (COVID-19).  The best way to prevent illness is to avoid being exposed to this virus.  The virus is thought to spread mainly from person-to-person. ? Between people who are in close contact with one another (within about 6 feet). ? Through respiratory droplets produced  when an infected person coughs, sneezes or talks. ? These droplets can land in the mouths or noses of people who are nearby or possibly be inhaled into the lungs. ? Some recent studies have suggested that COVID-19 may be spread by people who are not showing symptoms. Everyone should Clean your hands often  Wash your hands often with soap and water for at least 20 seconds especially after you have been in a public place, or after blowing your nose, coughing, or sneezing.  If soap and water are not readily available, use a hand sanitizer that contains at least 60% alcohol. Cover all surfaces of your hands and rub them together until they feel dry.  Avoid touching your eyes, nose, and mouth with unwashed hands. Avoid close contact  Stay home if you are sick.  Avoid close contact with people who are sick.  Put distance between yourself and other people. ? Remember that some people without symptoms may be able to spread virus. ? This is especially important for people who are at higher risk of getting very GainPain.com.cy Cover your mouth and nose with a cloth face cover when around others  You could spread  COVID-19 to others even if you do not feel sick.  Everyone should wear a cloth face cover when they have to go out in public, for example to the grocery store or to pick up other necessities. ? Cloth face coverings should not be placed on young children under age 25, anyone who has trouble breathing, or is unconscious, incapacitated or otherwise unable to remove the mask without assistance.  The cloth face cover is meant to protect other people in case you are infected.  Do NOT use a facemask meant for a Dietitian.  Continue to keep about 6 feet between yourself and others. The cloth face cover is not a substitute for social distancing. Cover coughs and sneezes  If you are in a private setting and do not have on your cloth face covering, remember to always cover your mouth and nose with a tissue when you cough or sneeze or use the inside of your elbow.  Throw used tissues in the trash.  Immediately wash your hands with soap and water for at least 20 seconds. If soap and water are not readily available, clean your hands with a hand sanitizer that contains at least 60% alcohol. Clean and disinfect  Clean AND disinfect frequently touched surfaces daily. This includes tables, doorknobs, light switches, countertops, handles, desks, phones, keyboards, toilets, faucets, and sinks. RackRewards.fr  If surfaces are dirty, clean them: Use detergent or soap and water prior to disinfection.  Then, use a household disinfectant. You can see a list of EPA-registered household disinfectants here. michellinders.com 08/12/2018 This information is not intended to replace advice given to you by your health care provider. Make sure you discuss any questions you have with your health care provider. Document Released: 07/22/2018 Document Revised: 08/20/2018 Document Reviewed: 07/22/2018 Elsevier Patient Education  2020 [employer].   COVID-19 Frequently Asked Questions COVID-19 (coronavirus disease) is an infection that is caused by a large family of viruses. Some viruses cause illness in people and others cause illness in animals like camels, cats, and bats. In some cases, the viruses that cause illness in animals can spread to humans. Where did the coronavirus come from? In December 2019, [place] told the [facility]) of several cases of lung disease (human respiratory illness). These cases were linked to an open seafood  and livestock market in the city of [place]. The link to the seafood and livestock market suggests that the virus may have spread from animals to humans. However, since that first outbreak in December, the virus has also been shown to spread from person to person. What is the name of the disease and the virus? Disease name Early on, this disease was called novel coronavirus. This is because scientists determined that the disease was caused by a new (novel) respiratory virus. The World Health Organization [facility]) has now named the disease COVID-19, or coronavirus disease. Virus name The virus that causes the disease is called severe acute respiratory syndrome coronavirus 2 (SARS-CoV-2). More information on disease and virus naming World Health Organization [facility]): www.who.int/emergencies/diseases/novel-coronavirus-2019/technical-guidance/naming-the-coronavirus-disease-(covid-2019)-and-the-virus-that-causes-it Who is at risk for complications from coronavirus disease? Some people may be at higher risk for complications from coronavirus disease. This includes older adults and people who have chronic diseases, such as heart disease, diabetes, and lung disease. If you are at higher risk for complications, take these extra precautions:  Avoid close contact with people who are sick or have a fever or cough. Stay at least 3-6 ft (1-2 m) away from them, if possible.  Wash your hands often with  soap and water for at least 20 seconds.  Avoid touching your face, mouth, nose, or eyes.  Keep supplies on hand at home, such as food, medicine, and cleaning supplies.  Stay home as much as possible.  Avoid social gatherings and travel. How does coronavirus disease spread? The virus that causes coronavirus disease spreads easily from person to person (is contagious). There are also cases of community-spread disease. This means the disease has spread to:  People who have no known contact with other infected people.  People who have not traveled to areas where there are known cases. It appears to spread from one person to another through droplets from coughing or sneezing. Can I get the virus from touching surfaces or objects? There is still a lot that we do not know about the virus that causes coronavirus disease. Scientists are basing a lot of information on what they know about similar viruses, such as:  Viruses cannot generally survive on surfaces for long. They need a human body (host) to survive.  It is more likely that the virus is spread by close contact with people who are sick (direct contact), such as through: ? Shaking hands or hugging. ? Breathing in respiratory droplets that travel through the air. This can happen when an infected person coughs or sneezes on or near other people.  It is less likely that the virus is spread when a person touches a surface or object that has the virus on it (indirect contact). The virus may be able to enter the body if the person touches a surface or object and then touches his or her face, eyes, nose, or mouth. Can a person spread the virus without having symptoms of the disease? It may be possible for the virus to spread before a person has symptoms of the disease, but this is most likely not the main way the virus is spreading. It is more likely for the virus to spread by being in close contact with people who are sick and breathing in the  respiratory droplets of a sick person's cough or sneeze. What are the symptoms of coronavirus disease? Symptoms vary from person to person and can range from mild to severe. Symptoms may include:  Fever.  Cough.  Tiredness, weakness, or fatigue.  Fast breathing or feeling short of breath. These symptoms can appear anywhere from 2 to 14 days after you have been exposed to the virus. If you develop symptoms, call your health care provider. People with severe symptoms may need hospital care. If I am exposed to the virus, how long does it take before symptoms start? Symptoms of coronavirus disease may appear anywhere from 2 to 14 days after a person has been exposed to the virus. If you develop symptoms, call your health care provider. Should I be tested for this virus? Your health care provider will decide whether to test you based on your symptoms, history of exposure, and your risk factors. How does a health care provider test for this virus? Health care providers will collect samples to send for testing. Samples may include:  Taking a swab of fluid from the nose.  Taking fluid from the lungs by having you cough up mucus (sputum) into a sterile cup.  Taking a blood sample.  Taking a stool or urine sample. Is there a treatment or vaccine for this virus? Currently, there is no vaccine to prevent coronavirus disease. Also, there are no medicines like antibiotics or antivirals to treat the virus. A person who becomes sick is given supportive care, which means rest and fluids. A person may also relieve his or her symptoms by using over-the-counter medicines that treat sneezing, coughing, and runny nose. These are the same medicines that a person takes for the common cold. If you develop symptoms, call your health care provider. People with severe symptoms may need hospital care. What can I do to protect myself and my family from this virus?     You can protect yourself and your family by  taking the same actions that you would take to prevent the spread of other viruses. Take the following actions:  Wash your hands often with soap and water for at least 20 seconds. If soap and water are not available, use alcohol-based hand sanitizer.  Avoid touching your face, mouth, nose, or eyes.  Cough or sneeze into a tissue, sleeve, or elbow. Do not cough or sneeze into your hand or the air. ? If you cough or sneeze into a tissue, throw it away immediately and wash your hands.  Disinfect objects and surfaces that you frequently touch every day.  Avoid close contact with people who are sick or have a fever or cough. Stay at least 3-6 ft (1-2 m) away from them, if possible.  Stay home if you are sick, except to get medical care. Call your health care provider before you get medical care.  Make sure your vaccines are up to date. Ask your health care provider what vaccines you need. What should I do if I need to travel? Follow travel recommendations from your local health authority, the CDC, and WHO. Travel information and advice  Centers for Disease Control and Prevention (CDC): BodyEditor.hu  World Health Organization [facility]): ThirdIncome.ca Know the risks and take action to protect your health  You are at higher risk of getting coronavirus disease if you are traveling to areas with an outbreak or if you are exposed to travelers from areas with an outbreak.  Wash your hands often and practice good hygiene to lower the risk of catching or spreading the virus. What should I do if I am sick? General instructions to stop the spread of infection  Wash your hands often with soap and water for at least 20 seconds. If soap and water  are not available, use alcohol-based hand sanitizer.  Cough or sneeze into a tissue, sleeve, or elbow. Do not cough or sneeze into your hand or the air.  If you  cough or sneeze into a tissue, throw it away immediately and wash your hands.  Stay home unless you must get medical care. Call your health care provider or local health authority before you get medical care.  Avoid public areas. Do not take public transportation, if possible.  If you can, wear a mask if you must go out of the house or if you are in close contact with someone who is not sick. Keep your home clean  Disinfect objects and surfaces that are frequently touched every day. This may include: ? Counters and tables. ? Doorknobs and light switches. ? Sinks and faucets. ? Electronics such as phones, remote controls, keyboards, computers, and tablets.  Wash dishes in hot, soapy water or use a dishwasher. Air-dry your dishes.  Wash laundry in hot water. Prevent infecting other household members  Let healthy household members care for children and pets, if possible. If you have to care for children or pets, wash your hands often and wear a mask.  Sleep in a different bedroom or bed, if possible.  Do not share personal items, such as razors, toothbrushes, deodorant, combs, brushes, towels, and washcloths. Where to find more information Centers for Disease Control and Prevention (CDC)  Information and news updates: https://www.butler-gonzalez.com/ World Health Organization [facility])  Information and news updates: MissExecutive.com.ee  Coronavirus health topic: https://www.castaneda.info/  Questions and answers on COVID-19: OpportunityDebt.at  Global tracker: who.sprinklr.com American Academy of Pediatrics (AAP)  Information for families: www.healthychildren.org/English/health-issues/conditions/chest-lungs/Pages/2019-Novel-Coronavirus.aspx The coronavirus situation is changing rapidly. Check your local health authority website or the CDC and [facility] websites for updates and news. When should I contact a  health care provider?  Contact your health care provider if you have symptoms of an infection, such as fever or cough, and you: ? Have been near anyone who is known to have coronavirus disease. ? Have come into contact with a person who is suspected to have coronavirus disease. ? Have traveled outside of the country. When should I get emergency medical care?  Get help right away by calling your local emergency services (911 in the [place] if you have: ? Trouble breathing. ? Pain or pressure in your chest. ? Confusion. ? Blue-tinged lips and fingernails. ? Difficulty waking from sleep. ? Symptoms that get worse. Let the emergency medical personnel know if you think you have coronavirus disease. Summary  A new respiratory virus is spreading from person to person and causing COVID-19 (coronavirus disease).  The virus that causes COVID-19 appears to spread easily. It spreads from one person to another through droplets from coughing or sneezing.  Older adults and those with chronic diseases are at higher risk of disease. If you are at higher risk for complications, take extra precautions.  There is currently no vaccine to prevent coronavirus disease. There are no medicines, such as antibiotics or antivirals, to treat the virus.  You can protect yourself and your family by washing your hands often, avoiding touching your face, and covering your coughs and sneezes. This information is not intended to replace advice given to you by your health care provider. Make sure you discuss any questions you have with your health care provider. Document Released: 07/22/2018 Document Revised: 07/22/2018 Document Reviewed: 07/22/2018 Elsevier Patient Education  [place].

## 2019-03-14 LAB — CULTURE, BLOOD (ROUTINE X 2)
Culture: NO GROWTH
Culture: NO GROWTH
Special Requests: ADEQUATE
Special Requests: ADEQUATE

## 2019-03-16 DIAGNOSIS — E1165 Type 2 diabetes mellitus with hyperglycemia: Secondary | ICD-10-CM | POA: Diagnosis not present

## 2019-03-16 DIAGNOSIS — F319 Bipolar disorder, unspecified: Secondary | ICD-10-CM | POA: Diagnosis not present

## 2019-03-16 DIAGNOSIS — G40909 Epilepsy, unspecified, not intractable, without status epilepticus: Secondary | ICD-10-CM | POA: Diagnosis not present

## 2019-03-16 DIAGNOSIS — Z794 Long term (current) use of insulin: Secondary | ICD-10-CM | POA: Diagnosis not present

## 2019-03-16 DIAGNOSIS — J1289 Other viral pneumonia: Secondary | ICD-10-CM | POA: Diagnosis not present

## 2019-03-16 DIAGNOSIS — Z87820 Personal history of traumatic brain injury: Secondary | ICD-10-CM | POA: Diagnosis not present

## 2019-03-16 DIAGNOSIS — J9601 Acute respiratory failure with hypoxia: Secondary | ICD-10-CM | POA: Diagnosis not present

## 2019-03-16 DIAGNOSIS — U071 COVID-19: Secondary | ICD-10-CM | POA: Diagnosis not present

## 2019-03-16 DIAGNOSIS — I10 Essential (primary) hypertension: Secondary | ICD-10-CM | POA: Diagnosis not present

## 2019-03-17 ENCOUNTER — Telehealth (HOSPITAL_COMMUNITY): Payer: Self-pay

## 2019-03-17 DIAGNOSIS — U071 COVID-19: Secondary | ICD-10-CM | POA: Diagnosis not present

## 2019-03-17 DIAGNOSIS — I10 Essential (primary) hypertension: Secondary | ICD-10-CM | POA: Diagnosis not present

## 2019-03-17 DIAGNOSIS — Z794 Long term (current) use of insulin: Secondary | ICD-10-CM | POA: Diagnosis not present

## 2019-03-17 DIAGNOSIS — J9601 Acute respiratory failure with hypoxia: Secondary | ICD-10-CM | POA: Diagnosis not present

## 2019-03-17 DIAGNOSIS — E1165 Type 2 diabetes mellitus with hyperglycemia: Secondary | ICD-10-CM | POA: Diagnosis not present

## 2019-03-17 DIAGNOSIS — J1289 Other viral pneumonia: Secondary | ICD-10-CM | POA: Diagnosis not present

## 2019-03-18 DIAGNOSIS — U071 COVID-19: Secondary | ICD-10-CM | POA: Diagnosis not present

## 2019-03-18 DIAGNOSIS — J9601 Acute respiratory failure with hypoxia: Secondary | ICD-10-CM | POA: Diagnosis not present

## 2019-03-18 DIAGNOSIS — E1165 Type 2 diabetes mellitus with hyperglycemia: Secondary | ICD-10-CM | POA: Diagnosis not present

## 2019-03-18 DIAGNOSIS — I10 Essential (primary) hypertension: Secondary | ICD-10-CM | POA: Diagnosis not present

## 2019-03-18 DIAGNOSIS — J1289 Other viral pneumonia: Secondary | ICD-10-CM | POA: Diagnosis not present

## 2019-03-18 DIAGNOSIS — Z794 Long term (current) use of insulin: Secondary | ICD-10-CM | POA: Diagnosis not present

## 2019-03-19 DIAGNOSIS — U071 COVID-19: Secondary | ICD-10-CM | POA: Diagnosis not present

## 2019-03-19 DIAGNOSIS — J9601 Acute respiratory failure with hypoxia: Secondary | ICD-10-CM | POA: Diagnosis not present

## 2019-03-19 DIAGNOSIS — Z794 Long term (current) use of insulin: Secondary | ICD-10-CM | POA: Diagnosis not present

## 2019-03-19 DIAGNOSIS — J1289 Other viral pneumonia: Secondary | ICD-10-CM | POA: Diagnosis not present

## 2019-03-19 DIAGNOSIS — I10 Essential (primary) hypertension: Secondary | ICD-10-CM | POA: Diagnosis not present

## 2019-03-19 DIAGNOSIS — E1165 Type 2 diabetes mellitus with hyperglycemia: Secondary | ICD-10-CM | POA: Diagnosis not present

## 2019-03-23 DIAGNOSIS — Z794 Long term (current) use of insulin: Secondary | ICD-10-CM | POA: Diagnosis not present

## 2019-03-23 DIAGNOSIS — J9601 Acute respiratory failure with hypoxia: Secondary | ICD-10-CM | POA: Diagnosis not present

## 2019-03-23 DIAGNOSIS — E1165 Type 2 diabetes mellitus with hyperglycemia: Secondary | ICD-10-CM | POA: Diagnosis not present

## 2019-03-23 DIAGNOSIS — J1289 Other viral pneumonia: Secondary | ICD-10-CM | POA: Diagnosis not present

## 2019-03-23 DIAGNOSIS — I10 Essential (primary) hypertension: Secondary | ICD-10-CM | POA: Diagnosis not present

## 2019-03-23 DIAGNOSIS — U071 COVID-19: Secondary | ICD-10-CM | POA: Diagnosis not present

## 2019-03-24 DIAGNOSIS — J9601 Acute respiratory failure with hypoxia: Secondary | ICD-10-CM | POA: Diagnosis not present

## 2019-03-24 DIAGNOSIS — Z794 Long term (current) use of insulin: Secondary | ICD-10-CM | POA: Diagnosis not present

## 2019-03-24 DIAGNOSIS — U071 COVID-19: Secondary | ICD-10-CM | POA: Diagnosis not present

## 2019-03-24 DIAGNOSIS — J1289 Other viral pneumonia: Secondary | ICD-10-CM | POA: Diagnosis not present

## 2019-03-24 DIAGNOSIS — I10 Essential (primary) hypertension: Secondary | ICD-10-CM | POA: Diagnosis not present

## 2019-03-24 DIAGNOSIS — E1165 Type 2 diabetes mellitus with hyperglycemia: Secondary | ICD-10-CM | POA: Diagnosis not present

## 2019-03-25 ENCOUNTER — Other Ambulatory Visit: Payer: Self-pay | Admitting: Internal Medicine

## 2019-03-25 DIAGNOSIS — U071 COVID-19: Secondary | ICD-10-CM

## 2019-03-25 DIAGNOSIS — I10 Essential (primary) hypertension: Secondary | ICD-10-CM | POA: Diagnosis not present

## 2019-03-25 DIAGNOSIS — J1289 Other viral pneumonia: Secondary | ICD-10-CM | POA: Diagnosis not present

## 2019-03-25 DIAGNOSIS — Z794 Long term (current) use of insulin: Secondary | ICD-10-CM | POA: Diagnosis not present

## 2019-03-25 DIAGNOSIS — E1165 Type 2 diabetes mellitus with hyperglycemia: Secondary | ICD-10-CM | POA: Diagnosis not present

## 2019-03-25 DIAGNOSIS — J9601 Acute respiratory failure with hypoxia: Secondary | ICD-10-CM | POA: Diagnosis not present

## 2019-03-26 ENCOUNTER — Other Ambulatory Visit: Payer: Self-pay | Admitting: "Endocrinology

## 2019-03-26 DIAGNOSIS — E1165 Type 2 diabetes mellitus with hyperglycemia: Secondary | ICD-10-CM | POA: Diagnosis not present

## 2019-03-26 DIAGNOSIS — J1289 Other viral pneumonia: Secondary | ICD-10-CM | POA: Diagnosis not present

## 2019-03-26 DIAGNOSIS — I10 Essential (primary) hypertension: Secondary | ICD-10-CM | POA: Diagnosis not present

## 2019-03-26 DIAGNOSIS — Z794 Long term (current) use of insulin: Secondary | ICD-10-CM | POA: Diagnosis not present

## 2019-03-26 DIAGNOSIS — U071 COVID-19: Secondary | ICD-10-CM | POA: Diagnosis not present

## 2019-03-26 DIAGNOSIS — J9601 Acute respiratory failure with hypoxia: Secondary | ICD-10-CM | POA: Diagnosis not present

## 2019-03-30 DIAGNOSIS — E1165 Type 2 diabetes mellitus with hyperglycemia: Secondary | ICD-10-CM | POA: Diagnosis not present

## 2019-03-30 DIAGNOSIS — I10 Essential (primary) hypertension: Secondary | ICD-10-CM | POA: Diagnosis not present

## 2019-03-30 DIAGNOSIS — U071 COVID-19: Secondary | ICD-10-CM | POA: Diagnosis not present

## 2019-03-30 DIAGNOSIS — J9601 Acute respiratory failure with hypoxia: Secondary | ICD-10-CM | POA: Diagnosis not present

## 2019-03-30 DIAGNOSIS — J1289 Other viral pneumonia: Secondary | ICD-10-CM | POA: Diagnosis not present

## 2019-03-30 DIAGNOSIS — Z794 Long term (current) use of insulin: Secondary | ICD-10-CM | POA: Diagnosis not present

## 2019-03-31 ENCOUNTER — Telehealth: Payer: Self-pay | Admitting: "Endocrinology

## 2019-03-31 ENCOUNTER — Other Ambulatory Visit: Payer: Self-pay | Admitting: "Endocrinology

## 2019-03-31 DIAGNOSIS — J1289 Other viral pneumonia: Secondary | ICD-10-CM | POA: Diagnosis not present

## 2019-03-31 DIAGNOSIS — E1165 Type 2 diabetes mellitus with hyperglycemia: Secondary | ICD-10-CM

## 2019-03-31 DIAGNOSIS — U071 COVID-19: Secondary | ICD-10-CM | POA: Diagnosis not present

## 2019-03-31 DIAGNOSIS — Z794 Long term (current) use of insulin: Secondary | ICD-10-CM | POA: Diagnosis not present

## 2019-03-31 DIAGNOSIS — I10 Essential (primary) hypertension: Secondary | ICD-10-CM | POA: Diagnosis not present

## 2019-03-31 DIAGNOSIS — J9601 Acute respiratory failure with hypoxia: Secondary | ICD-10-CM | POA: Diagnosis not present

## 2019-03-31 NOTE — Telephone Encounter (Signed)
Can you put in a lab order for him to get an A1C so I can fax it to [facility]

## 2019-03-31 NOTE — Telephone Encounter (Signed)
Order placed and faxed

## 2019-04-06 DIAGNOSIS — I10 Essential (primary) hypertension: Secondary | ICD-10-CM | POA: Diagnosis not present

## 2019-04-06 DIAGNOSIS — U071 COVID-19: Secondary | ICD-10-CM | POA: Diagnosis not present

## 2019-04-06 DIAGNOSIS — E1165 Type 2 diabetes mellitus with hyperglycemia: Secondary | ICD-10-CM | POA: Diagnosis not present

## 2019-04-06 DIAGNOSIS — Z794 Long term (current) use of insulin: Secondary | ICD-10-CM | POA: Diagnosis not present

## 2019-04-06 DIAGNOSIS — J9601 Acute respiratory failure with hypoxia: Secondary | ICD-10-CM | POA: Diagnosis not present

## 2019-04-06 DIAGNOSIS — J1289 Other viral pneumonia: Secondary | ICD-10-CM | POA: Diagnosis not present

## 2019-04-06 LAB — HEMOGLOBIN A1C: Hemoglobin A1C: 7.3

## 2019-04-08 ENCOUNTER — Encounter: Payer: Self-pay | Admitting: "Endocrinology

## 2019-04-08 ENCOUNTER — Ambulatory Visit (INDEPENDENT_AMBULATORY_CARE_PROVIDER_SITE_OTHER): Payer: Medicare Other | Admitting: "Endocrinology

## 2019-04-08 DIAGNOSIS — E1165 Type 2 diabetes mellitus with hyperglycemia: Secondary | ICD-10-CM

## 2019-04-08 DIAGNOSIS — I10 Essential (primary) hypertension: Secondary | ICD-10-CM | POA: Diagnosis not present

## 2019-04-08 DIAGNOSIS — E782 Mixed hyperlipidemia: Secondary | ICD-10-CM | POA: Diagnosis not present
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
## 2019-04-09 DIAGNOSIS — I10 Essential (primary) hypertension: Secondary | ICD-10-CM | POA: Diagnosis not present

## 2019-04-09 DIAGNOSIS — E1165 Type 2 diabetes mellitus with hyperglycemia: Secondary | ICD-10-CM | POA: Diagnosis not present

## 2019-04-09 DIAGNOSIS — J1289 Other viral pneumonia: Secondary | ICD-10-CM | POA: Diagnosis not present

## 2019-04-09 DIAGNOSIS — J9601 Acute respiratory failure with hypoxia: Secondary | ICD-10-CM | POA: Diagnosis not present

## 2019-04-09 DIAGNOSIS — U071 COVID-19: Secondary | ICD-10-CM | POA: Diagnosis not present

## 2019-04-09 DIAGNOSIS — Z794 Long term (current) use of insulin: Secondary | ICD-10-CM | POA: Diagnosis not present

## 2019-04-14 DIAGNOSIS — I10 Essential (primary) hypertension: Secondary | ICD-10-CM | POA: Diagnosis not present

## 2019-04-14 DIAGNOSIS — U071 COVID-19: Secondary | ICD-10-CM | POA: Diagnosis not present

## 2019-04-14 DIAGNOSIS — J1289 Other viral pneumonia: Secondary | ICD-10-CM | POA: Diagnosis not present

## 2019-04-14 DIAGNOSIS — E1165 Type 2 diabetes mellitus with hyperglycemia: Secondary | ICD-10-CM | POA: Diagnosis not present

## 2019-04-14 DIAGNOSIS — Z794 Long term (current) use of insulin: Secondary | ICD-10-CM | POA: Diagnosis not present

## 2019-04-14 DIAGNOSIS — J9601 Acute respiratory failure with hypoxia: Secondary | ICD-10-CM | POA: Diagnosis not present

## 2019-04-15 DIAGNOSIS — Z87820 Personal history of traumatic brain injury: Secondary | ICD-10-CM | POA: Diagnosis not present

## 2019-04-15 DIAGNOSIS — E1165 Type 2 diabetes mellitus with hyperglycemia: Secondary | ICD-10-CM | POA: Diagnosis not present

## 2019-04-15 DIAGNOSIS — U071 COVID-19: Secondary | ICD-10-CM | POA: Diagnosis not present

## 2019-04-15 DIAGNOSIS — F319 Bipolar disorder, unspecified: Secondary | ICD-10-CM | POA: Diagnosis not present

## 2019-04-15 DIAGNOSIS — G40909 Epilepsy, unspecified, not intractable, without status epilepticus: Secondary | ICD-10-CM | POA: Diagnosis not present

## 2019-04-15 DIAGNOSIS — J9601 Acute respiratory failure with hypoxia: Secondary | ICD-10-CM | POA: Diagnosis not present

## 2019-04-15 DIAGNOSIS — J1289 Other viral pneumonia: Secondary | ICD-10-CM | POA: Diagnosis not present

## 2019-04-15 DIAGNOSIS — I10 Essential (primary) hypertension: Secondary | ICD-10-CM | POA: Diagnosis not present

## 2019-04-15 DIAGNOSIS — Z794 Long term (current) use of insulin: Secondary | ICD-10-CM | POA: Diagnosis not present

## 2019-04-16 DIAGNOSIS — J1289 Other viral pneumonia: Secondary | ICD-10-CM | POA: Diagnosis not present

## 2019-04-16 DIAGNOSIS — U071 COVID-19: Secondary | ICD-10-CM | POA: Diagnosis not present

## 2019-04-16 DIAGNOSIS — Z794 Long term (current) use of insulin: Secondary | ICD-10-CM | POA: Diagnosis not present

## 2019-04-16 DIAGNOSIS — I10 Essential (primary) hypertension: Secondary | ICD-10-CM | POA: Diagnosis not present

## 2019-04-16 DIAGNOSIS — E1165 Type 2 diabetes mellitus with hyperglycemia: Secondary | ICD-10-CM | POA: Diagnosis not present

## 2019-04-16 DIAGNOSIS — J9601 Acute respiratory failure with hypoxia: Secondary | ICD-10-CM | POA: Diagnosis not present

## 2019-04-20 DIAGNOSIS — Z794 Long term (current) use of insulin: Secondary | ICD-10-CM | POA: Diagnosis not present

## 2019-04-20 DIAGNOSIS — J9601 Acute respiratory failure with hypoxia: Secondary | ICD-10-CM | POA: Diagnosis not present

## 2019-04-20 DIAGNOSIS — E1165 Type 2 diabetes mellitus with hyperglycemia: Secondary | ICD-10-CM | POA: Diagnosis not present

## 2019-04-20 DIAGNOSIS — J1289 Other viral pneumonia: Secondary | ICD-10-CM | POA: Diagnosis not present

## 2019-04-20 DIAGNOSIS — I10 Essential (primary) hypertension: Secondary | ICD-10-CM | POA: Diagnosis not present

## 2019-04-20 DIAGNOSIS — U071 COVID-19: Secondary | ICD-10-CM | POA: Diagnosis not present

## 2019-04-22 DIAGNOSIS — F310 Bipolar disorder, current episode hypomanic: Secondary | ICD-10-CM | POA: Diagnosis not present

## 2019-04-23 DIAGNOSIS — J9601 Acute respiratory failure with hypoxia: Secondary | ICD-10-CM | POA: Diagnosis not present

## 2019-04-23 DIAGNOSIS — E1165 Type 2 diabetes mellitus with hyperglycemia: Secondary | ICD-10-CM | POA: Diagnosis not present

## 2019-04-23 DIAGNOSIS — J1289 Other viral pneumonia: Secondary | ICD-10-CM | POA: Diagnosis not present

## 2019-04-23 DIAGNOSIS — I10 Essential (primary) hypertension: Secondary | ICD-10-CM | POA: Diagnosis not present

## 2019-04-23 DIAGNOSIS — Z794 Long term (current) use of insulin: Secondary | ICD-10-CM | POA: Diagnosis not present

## 2019-04-23 DIAGNOSIS — U071 COVID-19: Secondary | ICD-10-CM | POA: Diagnosis not present

## 2019-04-26 DIAGNOSIS — E1165 Type 2 diabetes mellitus with hyperglycemia: Secondary | ICD-10-CM | POA: Diagnosis not present

## 2019-04-26 DIAGNOSIS — M549 Dorsalgia, unspecified: Secondary | ICD-10-CM | POA: Diagnosis not present

## 2019-04-28 DIAGNOSIS — E1165 Type 2 diabetes mellitus with hyperglycemia: Secondary | ICD-10-CM | POA: Diagnosis not present

## 2019-04-28 DIAGNOSIS — Z794 Long term (current) use of insulin: Secondary | ICD-10-CM | POA: Diagnosis not present

## 2019-04-28 DIAGNOSIS — J1289 Other viral pneumonia: Secondary | ICD-10-CM | POA: Diagnosis not present

## 2019-04-28 DIAGNOSIS — J9601 Acute respiratory failure with hypoxia: Secondary | ICD-10-CM | POA: Diagnosis not present

## 2019-04-28 DIAGNOSIS — U071 COVID-19: Secondary | ICD-10-CM | POA: Diagnosis not present

## 2019-04-28 DIAGNOSIS — I10 Essential (primary) hypertension: Secondary | ICD-10-CM | POA: Diagnosis not present

## 2019-04-29 DIAGNOSIS — E119 Type 2 diabetes mellitus without complications: Secondary | ICD-10-CM | POA: Diagnosis not present

## 2019-04-29 DIAGNOSIS — Z7984 Long term (current) use of oral hypoglycemic drugs: Secondary | ICD-10-CM | POA: Diagnosis not present

## 2019-04-29 DIAGNOSIS — H25813 Combined forms of age-related cataract, bilateral: Secondary | ICD-10-CM | POA: Diagnosis not present

## 2019-05-05 DIAGNOSIS — L609 Nail disorder, unspecified: Secondary | ICD-10-CM | POA: Diagnosis not present

## 2019-05-05 DIAGNOSIS — E1142 Type 2 diabetes mellitus with diabetic polyneuropathy: Secondary | ICD-10-CM | POA: Diagnosis not present

## 2019-05-05 DIAGNOSIS — E1140 Type 2 diabetes mellitus with diabetic neuropathy, unspecified: Secondary | ICD-10-CM | POA: Diagnosis not present

## 2019-05-05 DIAGNOSIS — L110 Acquired keratosis follicularis: Secondary | ICD-10-CM | POA: Diagnosis not present

## 2019-05-06 DIAGNOSIS — E1165 Type 2 diabetes mellitus with hyperglycemia: Secondary | ICD-10-CM | POA: Diagnosis not present

## 2019-05-06 DIAGNOSIS — J9601 Acute respiratory failure with hypoxia: Secondary | ICD-10-CM | POA: Diagnosis not present

## 2019-05-06 DIAGNOSIS — J1289 Other viral pneumonia: Secondary | ICD-10-CM | POA: Diagnosis not present

## 2019-05-06 DIAGNOSIS — U071 COVID-19: Secondary | ICD-10-CM | POA: Diagnosis not present

## 2019-05-06 DIAGNOSIS — Z794 Long term (current) use of insulin: Secondary | ICD-10-CM | POA: Diagnosis not present

## 2019-05-06 DIAGNOSIS — I10 Essential (primary) hypertension: Secondary | ICD-10-CM | POA: Diagnosis not present

## 2019-05-11 DIAGNOSIS — Z23 Encounter for immunization: Secondary | ICD-10-CM | POA: Diagnosis not present

## 2019-05-27 DIAGNOSIS — E1165 Type 2 diabetes mellitus with hyperglycemia: Secondary | ICD-10-CM | POA: Diagnosis not present

## 2019-05-27 DIAGNOSIS — J219 Acute bronchiolitis, unspecified: Secondary | ICD-10-CM | POA: Diagnosis not present

## 2019-06-08 DIAGNOSIS — Z23 Encounter for immunization: Secondary | ICD-10-CM | POA: Diagnosis not present

## 2019-06-09 DIAGNOSIS — M6281 Muscle weakness (generalized): Secondary | ICD-10-CM | POA: Diagnosis not present

## 2019-06-09 DIAGNOSIS — R269 Unspecified abnormalities of gait and mobility: Secondary | ICD-10-CM | POA: Diagnosis not present

## 2019-06-09 DIAGNOSIS — R296 Repeated falls: Secondary | ICD-10-CM | POA: Diagnosis not present

## 2019-06-10 ENCOUNTER — Emergency Department (HOSPITAL_COMMUNITY): Payer: Medicare Other

## 2019-06-10 ENCOUNTER — Encounter (HOSPITAL_COMMUNITY): Payer: Self-pay | Admitting: Emergency Medicine

## 2019-06-10 ENCOUNTER — Other Ambulatory Visit: Payer: Self-pay

## 2019-06-10 ENCOUNTER — Inpatient Hospital Stay (HOSPITAL_COMMUNITY)
Admission: EM | Admit: 2019-06-10 | Discharge: 2019-06-15 | DRG: 558 | Disposition: A | Discharge status: Home Health | Payer: Medicare Other | Source: Skilled Nursing Facility | Attending: Internal Medicine | Admitting: Internal Medicine

## 2019-06-10 DIAGNOSIS — R0902 Hypoxemia: Secondary | ICD-10-CM | POA: Diagnosis not present

## 2019-06-10 DIAGNOSIS — E1165 Type 2 diabetes mellitus with hyperglycemia: Secondary | ICD-10-CM | POA: Diagnosis not present

## 2019-06-10 DIAGNOSIS — Z8616 Personal history of COVID-19: Secondary | ICD-10-CM

## 2019-06-10 DIAGNOSIS — R600 Localized edema: Secondary | ICD-10-CM | POA: Diagnosis not present

## 2019-06-10 DIAGNOSIS — M25562 Pain in left knee: Secondary | ICD-10-CM | POA: Diagnosis not present

## 2019-06-10 DIAGNOSIS — S069XAA Unspecified intracranial injury with loss of consciousness status unknown, initial encounter: Secondary | ICD-10-CM | POA: Diagnosis present

## 2019-06-10 DIAGNOSIS — R0602 Shortness of breath: Secondary | ICD-10-CM | POA: Diagnosis not present

## 2019-06-10 DIAGNOSIS — N179 Acute kidney failure, unspecified: Secondary | ICD-10-CM | POA: Diagnosis present

## 2019-06-10 DIAGNOSIS — S3993XA Unspecified injury of pelvis, initial encounter: Secondary | ICD-10-CM | POA: Diagnosis not present

## 2019-06-10 DIAGNOSIS — M1990 Unspecified osteoarthritis, unspecified site: Secondary | ICD-10-CM | POA: Diagnosis present

## 2019-06-10 DIAGNOSIS — S069X9A Unspecified intracranial injury with loss of consciousness of unspecified duration, initial encounter: Secondary | ICD-10-CM | POA: Diagnosis present

## 2019-06-10 DIAGNOSIS — Z800 Family history of malignant neoplasm of digestive organs: Secondary | ICD-10-CM

## 2019-06-10 DIAGNOSIS — S8992XA Unspecified injury of left lower leg, initial encounter: Secondary | ICD-10-CM | POA: Diagnosis not present

## 2019-06-10 DIAGNOSIS — E872 Acidosis, unspecified: Secondary | ICD-10-CM

## 2019-06-10 DIAGNOSIS — W010XXA Fall on same level from slipping, tripping and stumbling without subsequent striking against object, initial encounter: Secondary | ICD-10-CM | POA: Diagnosis present

## 2019-06-10 DIAGNOSIS — R000 Tachycardia, unspecified: Secondary | ICD-10-CM | POA: Diagnosis not present

## 2019-06-10 DIAGNOSIS — R6510 Systemic inflammatory response syndrome (SIRS) of non-infectious origin without acute organ dysfunction: Secondary | ICD-10-CM | POA: Diagnosis present

## 2019-06-10 DIAGNOSIS — Z79890 Hormone replacement therapy (postmenopausal): Secondary | ICD-10-CM

## 2019-06-10 DIAGNOSIS — I10 Essential (primary) hypertension: Secondary | ICD-10-CM | POA: Diagnosis present

## 2019-06-10 DIAGNOSIS — Z87820 Personal history of traumatic brain injury: Secondary | ICD-10-CM

## 2019-06-10 DIAGNOSIS — W19XXXA Unspecified fall, initial encounter: Secondary | ICD-10-CM | POA: Diagnosis not present

## 2019-06-10 DIAGNOSIS — A419 Sepsis, unspecified organism: Secondary | ICD-10-CM | POA: Diagnosis present

## 2019-06-10 DIAGNOSIS — Z743 Need for continuous supervision: Secondary | ICD-10-CM | POA: Diagnosis not present

## 2019-06-10 DIAGNOSIS — R7989 Other specified abnormal findings of blood chemistry: Secondary | ICD-10-CM | POA: Diagnosis not present

## 2019-06-10 DIAGNOSIS — S299XXA Unspecified injury of thorax, initial encounter: Secondary | ICD-10-CM | POA: Diagnosis not present

## 2019-06-10 DIAGNOSIS — Z20822 Contact with and (suspected) exposure to covid-19: Secondary | ICD-10-CM | POA: Diagnosis not present

## 2019-06-10 DIAGNOSIS — X58XXXA Exposure to other specified factors, initial encounter: Secondary | ICD-10-CM | POA: Diagnosis present

## 2019-06-10 DIAGNOSIS — S0990XA Unspecified injury of head, initial encounter: Secondary | ICD-10-CM | POA: Diagnosis not present

## 2019-06-10 DIAGNOSIS — I248 Other forms of acute ischemic heart disease: Secondary | ICD-10-CM | POA: Diagnosis present

## 2019-06-10 DIAGNOSIS — J449 Chronic obstructive pulmonary disease, unspecified: Secondary | ICD-10-CM | POA: Diagnosis present

## 2019-06-10 DIAGNOSIS — S8991XA Unspecified injury of right lower leg, initial encounter: Secondary | ICD-10-CM | POA: Diagnosis not present

## 2019-06-10 DIAGNOSIS — E782 Mixed hyperlipidemia: Secondary | ICD-10-CM | POA: Diagnosis present

## 2019-06-10 DIAGNOSIS — R05 Cough: Secondary | ICD-10-CM | POA: Diagnosis not present

## 2019-06-10 DIAGNOSIS — E860 Dehydration: Secondary | ICD-10-CM | POA: Diagnosis present

## 2019-06-10 DIAGNOSIS — Z794 Long term (current) use of insulin: Secondary | ICD-10-CM

## 2019-06-10 DIAGNOSIS — Z79899 Other long term (current) drug therapy: Secondary | ICD-10-CM

## 2019-06-10 DIAGNOSIS — F3160 Bipolar disorder, current episode mixed, unspecified: Secondary | ICD-10-CM | POA: Diagnosis present

## 2019-06-10 DIAGNOSIS — F79 Unspecified intellectual disabilities: Secondary | ICD-10-CM | POA: Diagnosis present

## 2019-06-10 DIAGNOSIS — T383X5A Adverse effect of insulin and oral hypoglycemic [antidiabetic] drugs, initial encounter: Secondary | ICD-10-CM | POA: Diagnosis present

## 2019-06-10 DIAGNOSIS — I313 Pericardial effusion (noninflammatory): Secondary | ICD-10-CM | POA: Diagnosis present

## 2019-06-10 DIAGNOSIS — M6282 Rhabdomyolysis: Secondary | ICD-10-CM | POA: Diagnosis not present

## 2019-06-10 DIAGNOSIS — Z8701 Personal history of pneumonia (recurrent): Secondary | ICD-10-CM

## 2019-06-10 DIAGNOSIS — R6520 Severe sepsis without septic shock: Secondary | ICD-10-CM | POA: Diagnosis present

## 2019-06-10 DIAGNOSIS — M25561 Pain in right knee: Secondary | ICD-10-CM | POA: Diagnosis not present

## 2019-06-10 DIAGNOSIS — S199XXA Unspecified injury of neck, initial encounter: Secondary | ICD-10-CM | POA: Diagnosis not present

## 2019-06-10 MED ORDER — SODIUM CHLORIDE 0.9 % IV BOLUS
1000.0000 mL | Freq: Once | INTRAVENOUS | Status: AC
  Administered 2019-06-10: 1000 mL via INTRAVENOUS

## 2019-06-10 MED ORDER — VANCOMYCIN HCL 2000 MG/400ML IV SOLN
2000.0000 mg | Freq: Once | INTRAVENOUS | Status: AC
  Administered 2019-06-11: 2000 mg via INTRAVENOUS
  Filled 2019-06-10: qty 400

## 2019-06-10 MED ORDER — VANCOMYCIN HCL IN DEXTROSE 1-5 GM/200ML-% IV SOLN: 1000.0000 mg | Freq: Once | INTRAVENOUS | Status: DC

## 2019-06-10 MED ORDER — SODIUM CHLORIDE 0.9 % IV SOLN
2.0000 g | Freq: Once | INTRAVENOUS | Status: AC
  Administered 2019-06-10: 2 g via INTRAVENOUS
  Filled 2019-06-10: qty 2

## 2019-06-10 NOTE — ED Triage Notes (Signed)
Pt had fall while ambulating outside at assisted living. He c/o bilateral knee pain. Ems placed c-collar on for precaution.
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
## 2019-06-11 ENCOUNTER — Other Ambulatory Visit: Payer: Self-pay

## 2019-06-11 ENCOUNTER — Emergency Department (HOSPITAL_COMMUNITY): Payer: Medicare Other

## 2019-06-11 DIAGNOSIS — M6282 Rhabdomyolysis: Secondary | ICD-10-CM | POA: Diagnosis present

## 2019-06-11 DIAGNOSIS — E860 Dehydration: Secondary | ICD-10-CM | POA: Diagnosis present

## 2019-06-11 DIAGNOSIS — M25561 Pain in right knee: Secondary | ICD-10-CM | POA: Diagnosis not present

## 2019-06-11 DIAGNOSIS — Z79890 Hormone replacement therapy (postmenopausal): Secondary | ICD-10-CM | POA: Diagnosis not present

## 2019-06-11 DIAGNOSIS — I313 Pericardial effusion (noninflammatory): Secondary | ICD-10-CM | POA: Diagnosis present

## 2019-06-11 DIAGNOSIS — R778 Other specified abnormalities of plasma proteins: Secondary | ICD-10-CM | POA: Diagnosis not present

## 2019-06-11 DIAGNOSIS — Z20822 Contact with and (suspected) exposure to covid-19: Secondary | ICD-10-CM | POA: Diagnosis present

## 2019-06-11 DIAGNOSIS — F79 Unspecified intellectual disabilities: Secondary | ICD-10-CM | POA: Diagnosis present

## 2019-06-11 DIAGNOSIS — M1990 Unspecified osteoarthritis, unspecified site: Secondary | ICD-10-CM | POA: Diagnosis present

## 2019-06-11 DIAGNOSIS — Z87820 Personal history of traumatic brain injury: Secondary | ICD-10-CM | POA: Diagnosis not present

## 2019-06-11 DIAGNOSIS — S199XXA Unspecified injury of neck, initial encounter: Secondary | ICD-10-CM | POA: Diagnosis not present

## 2019-06-11 DIAGNOSIS — S0990XA Unspecified injury of head, initial encounter: Secondary | ICD-10-CM | POA: Diagnosis not present

## 2019-06-11 DIAGNOSIS — R05 Cough: Secondary | ICD-10-CM | POA: Diagnosis not present

## 2019-06-11 DIAGNOSIS — R6510 Systemic inflammatory response syndrome (SIRS) of non-infectious origin without acute organ dysfunction: Secondary | ICD-10-CM | POA: Diagnosis present

## 2019-06-11 DIAGNOSIS — M25562 Pain in left knee: Secondary | ICD-10-CM | POA: Diagnosis present

## 2019-06-11 DIAGNOSIS — I248 Other forms of acute ischemic heart disease: Secondary | ICD-10-CM | POA: Diagnosis present

## 2019-06-11 DIAGNOSIS — S8992XA Unspecified injury of left lower leg, initial encounter: Secondary | ICD-10-CM | POA: Diagnosis not present

## 2019-06-11 DIAGNOSIS — J449 Chronic obstructive pulmonary disease, unspecified: Secondary | ICD-10-CM | POA: Diagnosis present

## 2019-06-11 DIAGNOSIS — Z800 Family history of malignant neoplasm of digestive organs: Secondary | ICD-10-CM | POA: Diagnosis not present

## 2019-06-11 DIAGNOSIS — N179 Acute kidney failure, unspecified: Secondary | ICD-10-CM | POA: Diagnosis present

## 2019-06-11 DIAGNOSIS — M6281 Muscle weakness (generalized): Secondary | ICD-10-CM | POA: Diagnosis not present

## 2019-06-11 DIAGNOSIS — R0902 Hypoxemia: Secondary | ICD-10-CM | POA: Diagnosis present

## 2019-06-11 DIAGNOSIS — Z794 Long term (current) use of insulin: Secondary | ICD-10-CM | POA: Diagnosis not present

## 2019-06-11 DIAGNOSIS — S299XXA Unspecified injury of thorax, initial encounter: Secondary | ICD-10-CM | POA: Diagnosis not present

## 2019-06-11 DIAGNOSIS — R6520 Severe sepsis without septic shock: Secondary | ICD-10-CM | POA: Diagnosis present

## 2019-06-11 DIAGNOSIS — F3160 Bipolar disorder, current episode mixed, unspecified: Secondary | ICD-10-CM | POA: Diagnosis present

## 2019-06-11 DIAGNOSIS — E872 Acidosis: Secondary | ICD-10-CM | POA: Diagnosis present

## 2019-06-11 DIAGNOSIS — A419 Sepsis, unspecified organism: Secondary | ICD-10-CM | POA: Diagnosis not present

## 2019-06-11 DIAGNOSIS — R269 Unspecified abnormalities of gait and mobility: Secondary | ICD-10-CM | POA: Diagnosis not present

## 2019-06-11 DIAGNOSIS — S8991XA Unspecified injury of right lower leg, initial encounter: Secondary | ICD-10-CM | POA: Diagnosis not present

## 2019-06-11 DIAGNOSIS — E1165 Type 2 diabetes mellitus with hyperglycemia: Secondary | ICD-10-CM | POA: Diagnosis present

## 2019-06-11 DIAGNOSIS — R600 Localized edema: Secondary | ICD-10-CM | POA: Diagnosis not present

## 2019-06-11 DIAGNOSIS — R296 Repeated falls: Secondary | ICD-10-CM | POA: Diagnosis not present

## 2019-06-11 DIAGNOSIS — R0602 Shortness of breath: Secondary | ICD-10-CM | POA: Diagnosis not present

## 2019-06-11 DIAGNOSIS — I10 Essential (primary) hypertension: Secondary | ICD-10-CM | POA: Diagnosis present

## 2019-06-11 DIAGNOSIS — S3993XA Unspecified injury of pelvis, initial encounter: Secondary | ICD-10-CM | POA: Diagnosis not present

## 2019-06-11 DIAGNOSIS — E782 Mixed hyperlipidemia: Secondary | ICD-10-CM | POA: Diagnosis present

## 2019-06-11 DIAGNOSIS — X58XXXA Exposure to other specified factors, initial encounter: Secondary | ICD-10-CM | POA: Diagnosis present

## 2019-06-11 DIAGNOSIS — R7989 Other specified abnormal findings of blood chemistry: Secondary | ICD-10-CM | POA: Diagnosis not present

## 2019-06-11 DIAGNOSIS — Z8616 Personal history of COVID-19: Secondary | ICD-10-CM | POA: Diagnosis not present

## 2019-06-11 DIAGNOSIS — Z79899 Other long term (current) drug therapy: Secondary | ICD-10-CM | POA: Diagnosis not present

## 2019-06-11 DIAGNOSIS — W010XXA Fall on same level from slipping, tripping and stumbling without subsequent striking against object, initial encounter: Secondary | ICD-10-CM | POA: Diagnosis present

## 2019-06-11 LAB — COMPREHENSIVE METABOLIC PANEL
ALT: 28 U/L (ref 0–44)
ALT: 14 U/L (ref 0–44)
AST: 28 U/L (ref 15–41)
AST: 45 U/L — ABNORMAL HIGH (ref 15–41)
Albumin: 3.4 g/dL — ABNORMAL LOW (ref 3.5–5.0)
Albumin: 3.1 g/dL — ABNORMAL LOW (ref 3.5–5.0)
Alkaline Phosphatase: 123 U/L (ref 38–126)
Alkaline Phosphatase: 118 U/L (ref 38–126)
Anion gap: 18 — ABNORMAL HIGH (ref 5–15)
Anion gap: 12 (ref 5–15)
BUN: 30 mg/dL — ABNORMAL HIGH (ref 6–20)
BUN: 25 mg/dL — ABNORMAL HIGH (ref 6–20)
CO2: 15 mmol/L — ABNORMAL LOW (ref 22–32)
CO2: 17 mmol/L — ABNORMAL LOW (ref 22–32)
Calcium: 9.4 mg/dL (ref 8.9–10.3)
Calcium: 9 mg/dL (ref 8.9–10.3)
Chloride: 101 mmol/L (ref 98–111)
Chloride: 114 mmol/L — ABNORMAL HIGH (ref 98–111)
Creatinine, Ser: 1.45 mg/dL — ABNORMAL HIGH (ref 0.61–1.24)
Creatinine, Ser: 1.04 mg/dL (ref 0.61–1.24)
GFR calc Af Amer: >60 mL/min (ref >60)
GFR calc Af Amer: >60 mL/min (ref >60)
GFR calc non Af Amer: 53 mL/min — ABNORMAL LOW (ref >60)
GFR calc non Af Amer: >60 mL/min (ref >60)
Glucose, Bld: 216 mg/dL — ABNORMAL HIGH (ref 70–99)
Glucose, Bld: 128 mg/dL — ABNORMAL HIGH (ref 70–99)
Potassium: 3.6 mmol/L (ref 3.5–5.1)
Potassium: 4.4 mmol/L (ref 3.5–5.1)
Sodium: 134 mmol/L — ABNORMAL LOW (ref 135–145)
Sodium: 143 mmol/L (ref 135–145)
Total Bilirubin: 0.6 mg/dL (ref 0.3–1.2)
Total Bilirubin: 0.6 mg/dL (ref 0.3–1.2)
Total Protein: 7.1 g/dL (ref 6.5–8.1)
Total Protein: 6.9 g/dL (ref 6.5–8.1)

## 2019-06-11 LAB — CBC WITH DIFFERENTIAL/PLATELET
Abs Immature Granulocytes: 0.3 10*3/uL — ABNORMAL HIGH (ref 0.00–0.07)
Abs Immature Granulocytes: 0.11 10*3/uL — ABNORMAL HIGH (ref 0.00–0.07)
Basophils Absolute: 0.1 10*3/uL (ref 0.0–0.1)
Basophils Absolute: 0 10*3/uL (ref 0.0–0.1)
Basophils Relative: 0 %
Basophils Relative: 0 %
Eosinophils Absolute: 0 10*3/uL (ref 0.0–0.5)
Eosinophils Absolute: 0.1 10*3/uL (ref 0.0–0.5)
Eosinophils Relative: 0 %
Eosinophils Relative: 1 %
HCT: 41.4 % (ref 39.0–52.0)
HCT: 40 % (ref 39.0–52.0)
Hemoglobin: 13.3 g/dL (ref 13.0–17.0)
Hemoglobin: 12.7 g/dL — ABNORMAL LOW (ref 13.0–17.0)
Immature Granulocytes: 2 %
Immature Granulocytes: 1 %
Lymphocytes Relative: 8 %
Lymphocytes Relative: 27 %
Lymphs Abs: 1.3 10*3/uL (ref 0.7–4.0)
Lymphs Abs: 3 10*3/uL (ref 0.7–4.0)
MCH: 31.1 pg (ref 26.0–34.0)
MCH: 31.4 pg (ref 26.0–34.0)
MCHC: 32.1 g/dL (ref 30.0–36.0)
MCHC: 31.8 g/dL (ref 30.0–36.0)
MCV: 97 fL (ref 80.0–100.0)
MCV: 99 fL (ref 80.0–100.0)
Monocytes Absolute: 1.5 10*3/uL — ABNORMAL HIGH (ref 0.1–1.0)
Monocytes Absolute: 1 10*3/uL (ref 0.1–1.0)
Monocytes Relative: 9 %
Monocytes Relative: 9 %
Neutro Abs: 13.5 10*3/uL — ABNORMAL HIGH (ref 1.7–7.7)
Neutro Abs: 6.7 10*3/uL (ref 1.7–7.7)
Neutrophils Relative %: 81 %
Neutrophils Relative %: 62 %
Platelets: 302 10*3/uL (ref 150–400)
Platelets: 280 10*3/uL (ref 150–400)
RBC: 4.27 MIL/uL (ref 4.22–5.81)
RBC: 4.04 MIL/uL — ABNORMAL LOW (ref 4.22–5.81)
RDW: 13.7 % (ref 11.5–15.5)
RDW: 14.1 % (ref 11.5–15.5)
WBC: 16.7 10*3/uL — ABNORMAL HIGH (ref 4.0–10.5)
WBC: 11 10*3/uL — ABNORMAL HIGH (ref 4.0–10.5)
nRBC: 0 % (ref 0.0–0.2)
nRBC: 0 % (ref 0.0–0.2)

## 2019-06-11 LAB — LACTIC ACID, PLASMA
Lactic Acid, Venous: 1.7 mmol/L (ref 0.5–1.9)
Lactic Acid, Venous: 2.8 mmol/L (ref 0.5–1.9)
Lactic Acid, Venous: 3.7 mmol/L (ref 0.5–1.9)
Lactic Acid, Venous: 4.5 mmol/L (ref 0.5–1.9)
Lactic Acid, Venous: 7.7 mmol/L (ref 0.5–1.9)

## 2019-06-11 LAB — HEMOGLOBIN A1C
Hgb A1c MFr Bld: 7.2 % — ABNORMAL HIGH (ref 4.8–5.6)
Mean Plasma Glucose: 159.94 mg/dL

## 2019-06-11 LAB — URINALYSIS, ROUTINE W REFLEX MICROSCOPIC
Bilirubin Urine: NEGATIVE
Glucose, UA: 50 mg/dL — AB
Ketones, ur: 5 mg/dL — AB
Leukocytes,Ua: NEGATIVE
Nitrite: NEGATIVE
Protein, ur: 100 mg/dL — AB
Specific Gravity, Urine: 1.011 (ref 1.005–1.030)
pH: 5 (ref 5.0–8.0)

## 2019-06-11 LAB — CK: Total CK: 5841 U/L — ABNORMAL HIGH (ref 49–397)

## 2019-06-11 LAB — TROPONIN I (HIGH SENSITIVITY)
Troponin I (High Sensitivity): 55 ng/L — ABNORMAL HIGH (ref <18)
Troponin I (High Sensitivity): 102 ng/L (ref <18)
Troponin I (High Sensitivity): 213 ng/L (ref <18)

## 2019-06-11 LAB — BRAIN NATRIURETIC PEPTIDE: B Natriuretic Peptide: 89 pg/mL (ref 0.0–100.0)

## 2019-06-11 LAB — LITHIUM LEVEL: Lithium Lvl: 0.48 mmol/L — ABNORMAL LOW (ref 0.60–1.20)

## 2019-06-11 LAB — APTT: aPTT: 31 seconds (ref 24–36)

## 2019-06-11 LAB — RESPIRATORY PANEL BY RT PCR (FLU A&B, COVID)
Influenza A by PCR: NEGATIVE
Influenza B by PCR: NEGATIVE
SARS Coronavirus 2 by RT PCR: NEGATIVE

## 2019-06-11 LAB — PROTIME-INR
INR: 1.1 (ref 0.8–1.2)
Prothrombin Time: 14.3 seconds (ref 11.4–15.2)

## 2019-06-11 LAB — PHOSPHORUS: Phosphorus: 3.2 mg/dL (ref 2.5–4.6)

## 2019-06-11 LAB — GLUCOSE, CAPILLARY
Glucose-Capillary: 141 mg/dL — ABNORMAL HIGH (ref 70–99)
Glucose-Capillary: 161 mg/dL — ABNORMAL HIGH (ref 70–99)
Glucose-Capillary: 150 mg/dL — ABNORMAL HIGH (ref 70–99)
Glucose-Capillary: 151 mg/dL — ABNORMAL HIGH (ref 70–99)

## 2019-06-11 LAB — TSH: TSH: 2.257 u[IU]/mL (ref 0.350–4.500)

## 2019-06-11 LAB — VALPROIC ACID LEVEL: Valproic Acid Lvl: 26 ug/mL — ABNORMAL LOW (ref 50.0–100.0)

## 2019-06-11 LAB — LIPASE, BLOOD: Lipase: 40 U/L (ref 11–51)

## 2019-06-11 LAB — D-DIMER, QUANTITATIVE: D-Dimer, Quant: 3.1 ug/mL-FEU — ABNORMAL HIGH (ref 0.00–0.50)

## 2019-06-11 MED ORDER — MAGNESIUM CITRATE PO SOLN: 1.0000 | Freq: Once | ORAL | Status: DC | PRN

## 2019-06-11 MED ORDER — CHLORHEXIDINE GLUCONATE CLOTH 2 % EX PADS
6.0000 | MEDICATED_PAD | Freq: Every day | CUTANEOUS | Status: DC
  Administered 2019-06-11 – 2019-06-13 (×3): 6 via TOPICAL

## 2019-06-11 MED ORDER — INSULIN ASPART 100 UNIT/ML ~~LOC~~ SOLN
0.0000 [IU] | Freq: Three times a day (TID) | SUBCUTANEOUS | Status: DC
  Administered 2019-06-11: 3 [IU] via SUBCUTANEOUS
  Administered 2019-06-11: 2 [IU] via SUBCUTANEOUS
  Administered 2019-06-12 (×2): 5 [IU] via SUBCUTANEOUS
  Administered 2019-06-12: 2 [IU] via SUBCUTANEOUS
  Administered 2019-06-12 – 2019-06-14 (×6): 3 [IU] via SUBCUTANEOUS
  Administered 2019-06-14: 8 [IU] via SUBCUTANEOUS
  Administered 2019-06-14: 3 [IU] via SUBCUTANEOUS
  Administered 2019-06-14: 2 [IU] via SUBCUTANEOUS
  Administered 2019-06-15: 5 [IU] via SUBCUTANEOUS
  Administered 2019-06-15: 2 [IU] via SUBCUTANEOUS

## 2019-06-11 MED ORDER — SODIUM CHLORIDE 0.9 % IV BOLUS
1000.0000 mL | Freq: Once | INTRAVENOUS | Status: AC
  Administered 2019-06-11: 1000 mL via INTRAVENOUS

## 2019-06-11 MED ORDER — METOPROLOL TARTRATE 25 MG PO TABS
12.5000 mg | ORAL_TABLET | Freq: Two times a day (BID) | ORAL | Status: DC
  Administered 2019-06-11 – 2019-06-15 (×9): 12.5 mg via ORAL
  Filled 2019-06-11 (×9): qty 1

## 2019-06-11 MED ORDER — INSULIN ASPART 100 UNIT/ML ~~LOC~~ SOLN
0.0000 [IU] | SUBCUTANEOUS | Status: DC
  Administered 2019-06-11: 2 [IU] via SUBCUTANEOUS
  Administered 2019-06-11: 3 [IU] via SUBCUTANEOUS

## 2019-06-11 MED ORDER — HYDRALAZINE HCL 20 MG/ML IJ SOLN: 10.0000 mg | Freq: Four times a day (QID) | INTRAMUSCULAR | Status: DC | PRN

## 2019-06-11 MED ORDER — LACTATED RINGERS IV BOLUS: 1000.0000 mL | Freq: Once | INTRAVENOUS | Status: DC

## 2019-06-11 MED ORDER — DIVALPROEX SODIUM 250 MG PO DR TAB
500.0000 mg | DELAYED_RELEASE_TABLET | Freq: Two times a day (BID) | ORAL | Status: DC
  Administered 2019-06-11 – 2019-06-15 (×9): 500 mg via ORAL
  Filled 2019-06-11 (×9): qty 2

## 2019-06-11 MED ORDER — ACETAMINOPHEN 650 MG RE SUPP: 650.0000 mg | Freq: Four times a day (QID) | RECTAL | Status: DC | PRN

## 2019-06-11 MED ORDER — IOHEXOL 350 MG/ML SOLN
100.0000 mL | Freq: Once | INTRAVENOUS | Status: AC | PRN
  Administered 2019-06-11: 100 mL via INTRAVENOUS

## 2019-06-11 MED ORDER — ACETAMINOPHEN 325 MG PO TABS: 650.0000 mg | ORAL_TABLET | Freq: Four times a day (QID) | ORAL | Status: DC | PRN

## 2019-06-11 MED ORDER — VANCOMYCIN HCL IN DEXTROSE 1-5 GM/200ML-% IV SOLN
1000.0000 mg | Freq: Two times a day (BID) | INTRAVENOUS | Status: DC
  Administered 2019-06-11 – 2019-06-12 (×2): 1000 mg via INTRAVENOUS
  Filled 2019-06-11 (×2): qty 200

## 2019-06-11 MED ORDER — HEPARIN SODIUM (PORCINE) 5000 UNIT/ML IJ SOLN
5000.0000 [IU] | Freq: Three times a day (TID) | INTRAMUSCULAR | Status: DC
  Administered 2019-06-11 – 2019-06-15 (×13): 5000 [IU] via SUBCUTANEOUS
  Filled 2019-06-11 (×12): qty 1

## 2019-06-11 MED ORDER — SODIUM CHLORIDE 0.9 % IV SOLN
INTRAVENOUS | Status: DC | PRN
  Administered 2019-06-11: 500 mL via INTRAVENOUS

## 2019-06-11 MED ORDER — BISACODYL 10 MG RE SUPP: 10.0000 mg | Freq: Every day | RECTAL | Status: DC | PRN

## 2019-06-11 MED ORDER — BENZTROPINE MESYLATE 1 MG PO TABS
1.0000 mg | ORAL_TABLET | Freq: Two times a day (BID) | ORAL | Status: DC
  Administered 2019-06-11 – 2019-06-15 (×9): 1 mg via ORAL
  Filled 2019-06-11 (×9): qty 1

## 2019-06-11 MED ORDER — KETOROLAC TROMETHAMINE 15 MG/ML IJ SOLN
15.0000 mg | Freq: Four times a day (QID) | INTRAMUSCULAR | Status: DC | PRN
  Administered 2019-06-11: 15 mg via INTRAVENOUS
  Filled 2019-06-11: qty 1

## 2019-06-11 MED ORDER — RISPERIDONE 1 MG PO TABS
2.0000 mg | ORAL_TABLET | Freq: Two times a day (BID) | ORAL | Status: DC
  Administered 2019-06-11 – 2019-06-15 (×9): 2 mg via ORAL
  Filled 2019-06-11 (×2): qty 4
  Filled 2019-06-11 (×5): qty 2
  Filled 2019-06-11: qty 4
  Filled 2019-06-11: qty 2

## 2019-06-11 MED ORDER — ORAL CARE MOUTH RINSE
15.0000 mL | Freq: Two times a day (BID) | OROMUCOSAL | Status: DC
  Administered 2019-06-11 – 2019-06-15 (×7): 15 mL via OROMUCOSAL

## 2019-06-11 MED ORDER — ONDANSETRON HCL 4 MG/2ML IJ SOLN: 4.0000 mg | Freq: Four times a day (QID) | INTRAMUSCULAR | Status: DC | PRN

## 2019-06-11 MED ORDER — LITHIUM CARBONATE ER 300 MG PO TBCR
300.0000 mg | EXTENDED_RELEASE_TABLET | Freq: Two times a day (BID) | ORAL | Status: DC
  Administered 2019-06-11 – 2019-06-15 (×9): 300 mg via ORAL
  Filled 2019-06-11 (×14): qty 1

## 2019-06-11 MED ORDER — METRONIDAZOLE IN NACL 5-0.79 MG/ML-% IV SOLN
500.0000 mg | Freq: Three times a day (TID) | INTRAVENOUS | Status: DC
  Administered 2019-06-11 – 2019-06-12 (×4): 500 mg via INTRAVENOUS
  Filled 2019-06-11 (×4): qty 100

## 2019-06-11 MED ORDER — ONDANSETRON HCL 4 MG PO TABS: 4.0000 mg | ORAL_TABLET | Freq: Four times a day (QID) | ORAL | Status: DC | PRN

## 2019-06-11 MED ORDER — INSULIN GLARGINE 100 UNIT/ML ~~LOC~~ SOLN
40.0000 [IU] | Freq: Every day | SUBCUTANEOUS | Status: DC
  Administered 2019-06-11 – 2019-06-14 (×4): 40 [IU] via SUBCUTANEOUS
  Filled 2019-06-11 (×5): qty 0.4

## 2019-06-11 MED ORDER — SODIUM CHLORIDE 0.9 % IV SOLN
2.0000 g | Freq: Three times a day (TID) | INTRAVENOUS | Status: DC
  Administered 2019-06-11 – 2019-06-12 (×4): 2 g via INTRAVENOUS
  Filled 2019-06-11 (×4): qty 2

## 2019-06-11 NOTE — Progress Notes (Signed)
Pharmacy Antibiotic Note  Scott [name] is a 59 y.o. male admitted on 06/10/2019 with pneumonia.  Pharmacy has been consulted for cefepime and vanomcyin dosing.  Plan: Cefepime 2 Gm IV q8h Vancomycin 2 Gm x1 then 1 Gm IV q12h for est AUC = 483 Goal AUC = 400-550 F/u scr/cultures/levels   Weight: 216 lb 0.8 oz (98 kg)  Temp (24hrs), Avg:98.8 F (37.1 C), Min:97.6 F (36.4 C), Max:99.9 F (37.7 C)  Recent Labs  Lab 06/10/19 2321  WBC 16.7*    CrCl cannot be calculated (Patient's most recent lab result is older than the maximum 21 days allowed.).    No Known Allergies  Antimicrobials this admission: 3/4 cefepime >>  3/4 vancomycin >>   Dose adjustments this admission:   Microbiology results:  BCx:   UCx:    Sputum:    MRSA PCR:   Thank you for allowing pharmacy to be a part of this patient's care.  Dorrene German 06/11/2019 12:30 AM
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
## 2019-06-11 NOTE — ED Notes (Signed)
Date and time results received: 06/11/19 0043 (use smartphrase ".now" to insert current time)  Test: Lactic Acid Critical Value: 7.7  Name of Provider Notified: Dr Wyvonnia Dusky  Orders Received? Or Actions Taken?: Actions Taken: no orders received

## 2019-06-11 NOTE — ED Notes (Signed)
Date and time results received: 06/11/19 0158 (use smartphrase ".now" to insert current time)  Test: lactic acid Critical Value: 4.5  Name of Provider Notified: Dr Wyvonnia Dusky  Orders Received? Or Actions Taken?: Actions Taken: no orders received

## 2019-06-11 NOTE — ED Notes (Signed)
Date and time results received: 06/11/19 0158 (use smartphrase ".now" to insert current time)  Test: troponin Critical Value: 102  Name of Provider Notified: Dr Wyvonnia Dusky  Orders Received? Or Actions Taken?: no orders received

## 2019-06-11 NOTE — ED Notes (Signed)
Pt having urinary retention and bladder scan revealed >588 in bladder. Order received to place foley.
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
## 2019-06-12 ENCOUNTER — Inpatient Hospital Stay (HOSPITAL_COMMUNITY): Payer: Medicare Other

## 2019-06-12 DIAGNOSIS — I10 Essential (primary) hypertension: Secondary | ICD-10-CM

## 2019-06-12 DIAGNOSIS — E872 Acidosis: Secondary | ICD-10-CM

## 2019-06-12 DIAGNOSIS — E782 Mixed hyperlipidemia: Secondary | ICD-10-CM

## 2019-06-12 DIAGNOSIS — E1165 Type 2 diabetes mellitus with hyperglycemia: Secondary | ICD-10-CM

## 2019-06-12 DIAGNOSIS — J449 Chronic obstructive pulmonary disease, unspecified: Secondary | ICD-10-CM

## 2019-06-12 DIAGNOSIS — F3160 Bipolar disorder, current episode mixed, unspecified: Secondary | ICD-10-CM

## 2019-06-12 DIAGNOSIS — R0602 Shortness of breath: Secondary | ICD-10-CM

## 2019-06-12 DIAGNOSIS — R6510 Systemic inflammatory response syndrome (SIRS) of non-infectious origin without acute organ dysfunction: Secondary | ICD-10-CM

## 2019-06-12 LAB — PROTIME-INR
INR: 1.1 (ref 0.8–1.2)
Prothrombin Time: 13.8 seconds (ref 11.4–15.2)

## 2019-06-12 LAB — GLUCOSE, CAPILLARY
Glucose-Capillary: 137 mg/dL — ABNORMAL HIGH (ref 70–99)
Glucose-Capillary: 164 mg/dL — ABNORMAL HIGH (ref 70–99)
Glucose-Capillary: 229 mg/dL — ABNORMAL HIGH (ref 70–99)
Glucose-Capillary: 216 mg/dL — ABNORMAL HIGH (ref 70–99)

## 2019-06-12 LAB — URINE CULTURE: Culture: NO GROWTH

## 2019-06-12 LAB — COMPREHENSIVE METABOLIC PANEL
ALT: 18 U/L (ref 0–44)
AST: 74 U/L — ABNORMAL HIGH (ref 15–41)
Albumin: 3.2 g/dL — ABNORMAL LOW (ref 3.5–5.0)
Alkaline Phosphatase: 113 U/L (ref 38–126)
Anion gap: 11 (ref 5–15)
BUN: 21 mg/dL — ABNORMAL HIGH (ref 6–20)
CO2: 17 mmol/L — ABNORMAL LOW (ref 22–32)
Calcium: 8.4 mg/dL — ABNORMAL LOW (ref 8.9–10.3)
Chloride: 110 mmol/L (ref 98–111)
Creatinine, Ser: 1.01 mg/dL (ref 0.61–1.24)
GFR calc Af Amer: >60 mL/min (ref >60)
GFR calc non Af Amer: >60 mL/min (ref >60)
Glucose, Bld: 169 mg/dL — ABNORMAL HIGH (ref 70–99)
Potassium: 3.8 mmol/L (ref 3.5–5.1)
Sodium: 138 mmol/L (ref 135–145)
Total Bilirubin: 0.9 mg/dL (ref 0.3–1.2)
Total Protein: 7 g/dL (ref 6.5–8.1)

## 2019-06-12 LAB — TROPONIN I (HIGH SENSITIVITY): Troponin I (High Sensitivity): 54 ng/L — ABNORMAL HIGH (ref <18)

## 2019-06-12 LAB — CBC
HCT: 38.8 % — ABNORMAL LOW (ref 39.0–52.0)
Hemoglobin: 12.4 g/dL — ABNORMAL LOW (ref 13.0–17.0)
MCH: 31.2 pg (ref 26.0–34.0)
MCHC: 32 g/dL (ref 30.0–36.0)
MCV: 97.7 fL (ref 80.0–100.0)
Platelets: 256 10*3/uL (ref 150–400)
RBC: 3.97 MIL/uL — ABNORMAL LOW (ref 4.22–5.81)
RDW: 13.9 % (ref 11.5–15.5)
WBC: 7.2 10*3/uL (ref 4.0–10.5)
nRBC: 0 % (ref 0.0–0.2)

## 2019-06-12 LAB — ECHOCARDIOGRAM COMPLETE
Height: 74 in
Weight: 3418.01 oz

## 2019-06-12 LAB — MRSA PCR SCREENING: MRSA by PCR: NEGATIVE

## 2019-06-12 LAB — PROCALCITONIN: Procalcitonin: 1.14 ng/mL

## 2019-06-12 LAB — CORTISOL-AM, BLOOD: Cortisol - AM: 8.5 ug/dL (ref 6.7–22.6)

## 2019-06-12 LAB — CK: Total CK: 4697 U/L — ABNORMAL HIGH (ref 49–397)

## 2019-06-12 MED ORDER — AMLODIPINE BESYLATE 5 MG PO TABS
5.0000 mg | ORAL_TABLET | Freq: Every day | ORAL | Status: DC
  Administered 2019-06-12 – 2019-06-15 (×4): 5 mg via ORAL
  Filled 2019-06-12 (×4): qty 1

## 2019-06-12 MED ORDER — AMLODIPINE BESYLATE 5 MG PO TABS: 5.0000 mg | ORAL_TABLET | Freq: Every day | ORAL | Status: DC

## 2019-06-12 MED ORDER — LACTATED RINGERS IV SOLN: INTRAVENOUS | Status: AC

## 2019-06-12 NOTE — Progress Notes (Signed)
Foley discontinued per order. Pt tolerated well. 950 cc of clear yellow urine emptied from foley bag. Pt instructed on using urinal or BSC when needed to void. Will continue to monitor.
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
## 2019-06-12 NOTE — Care Management Important Message (Signed)
Important Message  Patient Details  Name: Scott [name] MRN: [id number] Date of Birth: 1960/11/27   Medicare Important Message Given:  Yes     Tommy Medal 06/12/2019, 1:55 PM

## 2019-06-12 NOTE — Plan of Care (Signed)
  Problem: Acute Rehab PT Goals(only PT should resolve) Goal: Pt Will Go Supine/Side To Sit Outcome: Progressing Flowsheets (Taken 06/12/2019 1452) Pt will go Supine/Side to Sit: with supervision Goal: Pt Will Go Sit To Supine/Side Outcome: Progressing Flowsheets (Taken 06/12/2019 1452) Pt will go Sit to Supine/Side: with supervision Goal: Patient Will Transfer Sit To/From Stand Outcome: Progressing Flowsheets (Taken 06/12/2019 1452) Patient will transfer sit to/from stand: with supervision Goal: Pt Will Transfer Bed To Chair/Chair To Bed Outcome: Progressing Flowsheets (Taken 06/12/2019 1452) Pt will Transfer Bed to Chair/Chair to Bed: with supervision  2:53 PM, 06/12/19 Mearl Latin PT, DPT Physical Therapist at [facility]

## 2019-06-12 NOTE — Evaluation (Signed)
Physical Therapy Evaluation Patient Details Name: Scott [name] MRN: [id number] DOB: 1961/02/13 Today's Date: 06/12/2019   History of Present Illness  Scott [name] is a 59 y.o. male with medical history significant of multiple past medical history including traumatic brain injury, currently resident of nursing home, bipolar disorder, diabetes mellitus type 2, hyperlipidemia, hypertension, mild MR, recent COVID-19 infection with full recovery presented overnight on 06/11/2019 post fall.Patient cannot recall the mechanism of fall but reports that he did not pass out but he did fell while he was walking outside, he does not use any cane or walker.  Reporting he hit his knees elbows and face but did not loss any consciousness.  He did not have any dizziness, asymmetric weakness.  Recalls the event.  No report of any shaking, loss of bowel or bladder.  Reports side of his face, and his knees he did not sustain any other injuries.    Clinical Impression  Patient limited for functional mobility as stated below secondary to BLE weakness, fatigue and impaired standing balance. Patient able to complete bed mobility and transfer with min assist/guard. He uses RW due to fear of falling again. He is able to ambulate 120 feet with use of RW and without AD but ambulates with same gait pattern each time which consists of shuffled gait with small step length. Patient returned to room and left seated in chair - RN aware. Patient will benefit from continued physical therapy in hospital and recommended venue below to increase strength, balance, endurance for safe ADLs and gait.     Follow Up Recommendations Supervision for mobility/OOB;Other (comment)(Assisted Living)    Equipment Recommendations  None recommended by PT    Recommendations for Other Services       Precautions / Restrictions Precautions Precautions: Fall Restrictions Weight Bearing Restrictions: No      Mobility  Bed Mobility Overal bed  mobility: Needs Assistance Bed Mobility: Supine to Sit     Supine to sit: Min assist     General bed mobility comments: to transition to seated EOB  Transfers Overall transfer level: Needs assistance Equipment used: Rolling walker (2 wheeled) Transfers: Sit to/from [facility] Sit to Stand: Min guard Stand pivot transfers: Min guard       General transfer comment: to transfer to standing with use of RW, guard for safety and balance  Ambulation/Gait Ambulation/Gait assistance: Min guard Gait Distance (Feet): 240 Feet Assistive device: Rolling walker (2 wheeled);None Gait Pattern/deviations: Decreased step length - right;Decreased step length - left;Step-through pattern;Shuffle;Decreased stride length Gait velocity: decreased   General Gait Details: Patient ambulates with slow, shuffled gait, becomes SOB while ambulating, 120 feet with RW, 120 feet no AD  Stairs            Wheelchair Mobility    Modified Rankin (Stroke Patients Only)       Balance Overall balance assessment: Needs assistance Sitting-balance support: Feet supported;No upper extremity supported Sitting balance-Leahy Scale: Good Sitting balance - Comments: seated EOB   Standing balance support: No upper extremity supported;During functional activity Standing balance-Leahy Scale: Fair                               Pertinent Vitals/Pain Pain Assessment: No/denies pain    Home Living Family/patient expects to be discharged to:: Assisted living                 Additional Comments: He can get AD  if needed    Prior Function Level of Independence: Needs assistance   Gait / Transfers Assistance Needed: patient ambulates up an down halls at living facility  ADL's / [place] Needed: assisted by staff as needed for ADL        Hand Dominance   Dominant Hand: Right    Extremity/Trunk Assessment   Upper Extremity Assessment Upper Extremity  Assessment: Generalized weakness    Lower Extremity Assessment Lower Extremity Assessment: Generalized weakness    Cervical / Trunk Assessment Cervical / Trunk Assessment: Normal  Communication   Communication: Expressive difficulties  Cognition Arousal/Alertness: Awake/alert Behavior During Therapy: WFL for tasks assessed/performed Overall Cognitive Status: No family/caregiver present to determine baseline cognitive functioning                                        General Comments      Exercises     Assessment/Plan    PT Assessment Patient needs continued PT services  PT Problem List Decreased strength;Decreased mobility;Decreased activity tolerance;Decreased balance;Decreased knowledge of use of DME       PT Treatment Interventions DME instruction;Therapeutic exercise;Balance training;Gait training;Functional mobility training;Therapeutic activities;Patient/family education    PT Goals (Current goals can be found in the Care Plan section)  Acute Rehab PT Goals Patient Stated Goal: Return to ALF PT Goal Formulation: With patient Time For Goal Achievement: 06/19/19 Potential to Achieve Goals: Good    Frequency Min 3X/week   Barriers to discharge        Co-evaluation               AM-PAC PT "6 Clicks" Mobility  Outcome Measure Help needed turning from your back to your side while in a flat bed without using bedrails?: None Help needed moving from lying on your back to sitting on the side of a flat bed without using bedrails?: A Little Help needed moving to and from a bed to a chair (including a wheelchair)?: A Little Help needed standing up from a chair using your arms (e.g., wheelchair or bedside chair)?: A Little Help needed to walk in hospital room?: A Little Help needed climbing 3-5 steps with a railing? : A Lot 6 Click Score: 18    End of Session Equipment Utilized During Treatment: Gait belt Activity Tolerance: Patient tolerated  treatment well;Patient limited by fatigue Patient left: in chair;with call bell/phone within reach Nurse Communication: Mobility status PT Visit Diagnosis: Unsteadiness on feet (R26.81);Other abnormalities of gait and mobility (R26.89);Muscle weakness (generalized) (M62.81);History of falling (Z91.81)    Time: [id number] PT Time Calculation (min) (ACUTE ONLY): 31 min   Charges:   PT Evaluation $PT Eval Moderate Complexity: 1 Mod PT Treatments $Therapeutic Activity: 23-37 mins        2:51 PM, 06/12/19 Mearl Latin PT, DPT Physical Therapist at [facility]
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
## 2019-06-12 NOTE — Progress Notes (Signed)
Pt arrives on floor via WC accompanied by ICU NT. He is alert and is able to transfer from [facility] to room chair independently with SBA. He denies any pain at this time and requests something to drink.

## 2019-06-12 NOTE — Progress Notes (Addendum)
Progress Note  Patient Name: Scott [name] Date of Encounter: 06/12/2019  Primary Cardiologist: New, Dr Harl Bowie  Subjective   No complaints  Inpatient Medications    Scheduled Meds: . benztropine  1 mg Oral BID  . Chlorhexidine Gluconate Cloth  6 each Topical Daily  . divalproex  500 mg Oral BID  . heparin  5,000 Units Subcutaneous Q8H  . insulin aspart  0-15 Units Subcutaneous TID AC & HS  . insulin glargine  40 Units Subcutaneous QHS  . lithium carbonate  300 mg Oral Q12H  . mouth rinse  15 mL Mouth Rinse BID  . metoprolol tartrate  12.5 mg Oral BID  . risperiDONE  2 mg Oral BID   Continuous Infusions: . sodium chloride Stopped (06/12/19 0822)  . ceFEPime (MAXIPIME) IV 2 g (06/12/19 [id number])  . lactated ringers    . metronidazole Stopped (06/12/19 0315)  . vancomycin Stopped (06/12/19 0148)   PRN Meds: sodium chloride, acetaminophen **OR** acetaminophen, bisacodyl, hydrALAZINE, ketorolac, magnesium citrate, ondansetron **OR** ondansetron (ZOFRAN) IV   Vital Signs    Vitals:   06/12/19 0635 06/12/19 0700 06/12/19 0800 06/12/19 0900  BP:  (!) 141/95 (!) 156/99 (!) 168/94  Pulse:  92 89 (!) 107  Resp:  15 18 (!) 29  Temp:   97.9 F (36.6 C)   TempSrc:   Oral   SpO2:  96% 97% 92%  Weight: 96.9 kg     Height:        Intake/Output Summary (Last 24 hours) at 06/12/2019 0913 Last data filed at 06/12/2019 [id number] Gross per 24 hour  Intake 1791.36 ml  Output 4450 ml  Net -2658.64 ml   Last 3 Weights 06/12/2019 06/11/2019 06/10/2019  Weight (lbs) 213 lb 10 oz 214 lb 4.6 oz 216 lb 0.8 oz  Weight (kg) 96.9 kg 97.2 kg 98 kg  Some encounter information is confidential and restricted. Go to Review Flowsheets activity to see all data.      Telemetry    SR - Personally Reviewed  ECG    n/a - Personally Reviewed  Physical Exam   GEN: No acute distress.   Neck: No JVD Cardiac: RRR, no murmurs, rubs, or gallops.  Respiratory: mild expiratory wheeze GI: Soft, nontender,  non-distended  MS: No edema; No deformity. Neuro:  Nonfocal  Psych: Normal affect   Labs    High Sensitivity Troponin:   Recent Labs  Lab 06/10/19 2321 06/11/19 0113 06/11/19 1131 06/12/19 0450  TROPONINIHS 55* 102* 213* 54*      Chemistry Recent Labs  Lab 06/10/19 2321 06/11/19 1131  NA 134* 143  K 3.6 4.4  CL 101 114*  CO2 15* 17*  GLUCOSE 216* 128*  BUN 30* 25*  CREATININE 1.45* 1.04  CALCIUM 9.4 9.0  PROT 7.1 6.9  ALBUMIN 3.4* 3.1*  AST 28 45*  ALT 28 14  ALKPHOS 123 118  BILITOT 0.6 0.6  GFRNONAA 53* >60  GFRAA >60 >60  ANIONGAP 18* 12     Hematology Recent Labs  Lab 06/10/19 2321 06/11/19 1131  WBC 16.7* 11.0*  RBC 4.27 4.04*  HGB 13.3 12.7*  HCT 41.4 40.0  MCV 97.0 99.0  MCH 31.1 31.4  MCHC 32.1 31.8  RDW 13.7 14.1  PLT 302 280    BNP Recent Labs  Lab 06/11/19 1132  BNP 89.0     DDimer  Recent Labs  Lab 06/10/19 2321  DDIMER 3.10*     Radiology    CT Head  Wo Contrast  Result Date: 06/11/2019 CLINICAL DATA:  Recent fall EXAM: CT HEAD WITHOUT CONTRAST CT CERVICAL SPINE WITHOUT CONTRAST TECHNIQUE: Multidetector CT imaging of the head and cervical spine was performed following the standard protocol without intravenous contrast. Multiplanar CT image reconstructions of the cervical spine were also generated. COMPARISON:  None. FINDINGS: CT HEAD FINDINGS Brain: No evidence of acute infarction, hemorrhage, hydrocephalus, extra-axial collection or mass lesion/mass effect. Vascular: No hyperdense vessel or unexpected calcification. Skull: Normal. Negative for fracture or focal lesion. Sinuses/Orbits: No acute finding. Other: None. CT CERVICAL SPINE FINDINGS Alignment: Within normal limits. Skull base and vertebrae: 7 cervical segments are well visualized. Vertebral body height is well maintained. Disc space narrowing is noted at C5-6 with mild osteophytic changes at C5-6 and C6-7. No acute fracture or acute facet abnormality is noted. Soft  tissues and spinal canal: Surrounding soft tissue structures are within normal limits. Upper chest: Lung apices show scarring emphysematous change. No acute abnormality is noted. Other: None IMPRESSION: CT of the head: Normal head CT. CT of the cervical spine: Degenerative change without acute abnormality. Electronically Signed   By: Inez Catalina M.D.   On: 06/11/2019 02:13   CT Angio Chest PE W and/or Wo Contrast  Result Date: 06/11/2019 CLINICAL DATA:  Fall outside assisted living, positive D-dimer EXAM: CT ANGIOGRAPHY CHEST WITH CONTRAST TECHNIQUE: Multidetector CT imaging of the chest was performed using the standard protocol during bolus administration of intravenous contrast. Multiplanar CT image reconstructions and MIPs were obtained to evaluate the vascular anatomy. CONTRAST:  15mL OMNIPAQUE IOHEXOL 350 MG/ML SOLN COMPARISON:  Radiograph same day FINDINGS: Cardiovascular: There is a optimal opacification of the pulmonary arteries. There is no central,segmental, or subsegmental filling defects within the pulmonary arteries. The heart is normal in size. No pericardial effusion or thickening. No evidence right heart strain. There is normal three-vessel brachiocephalic anatomy without proximal stenosis. The thoracic aorta is normal in appearance. Mediastinum/Nodes: No hilar, mediastinal, or axillary adenopathy. Thyroid gland, trachea, and are unremarkable. There is a small amount of fluid seen refluxed the distal esophagus. Lungs/Pleura: Biapical subpleural bleb formation is seen. There is mild bibasilar dependent subsegmental atelectasis. No large airspace consolidation or pleural effusion. Upper Abdomen: Mildly dilated fluid and food debris filled stomach is seen. Musculoskeletal: No chest wall abnormality. No acute or significant osseous findings. Review of the MIP images confirms the above findings. IMPRESSION: No central, segmental, or subsegmental pulmonary embolism. Bibasilar subsegmental atelectasis.  Small amount of fluid seen reflect in the distal esophagus with a mildly dilated stomach. Electronically Signed   By: Prudencio Pair M.D.   On: 06/11/2019 02:14   CT Cervical Spine Wo Contrast  Result Date: 06/11/2019 CLINICAL DATA:  Recent fall EXAM: CT HEAD WITHOUT CONTRAST CT CERVICAL SPINE WITHOUT CONTRAST TECHNIQUE: Multidetector CT imaging of the head and cervical spine was performed following the standard protocol without intravenous contrast. Multiplanar CT image reconstructions of the cervical spine were also generated. COMPARISON:  None. FINDINGS: CT HEAD FINDINGS Brain: No evidence of acute infarction, hemorrhage, hydrocephalus, extra-axial collection or mass lesion/mass effect. Vascular: No hyperdense vessel or unexpected calcification. Skull: Normal. Negative for fracture or focal lesion. Sinuses/Orbits: No acute finding. Other: None. CT CERVICAL SPINE FINDINGS Alignment: Within normal limits. Skull base and vertebrae: 7 cervical segments are well visualized. Vertebral body height is well maintained. Disc space narrowing is noted at C5-6 with mild osteophytic changes at C5-6 and C6-7. No acute fracture or acute facet abnormality is noted. Soft tissues and spinal  canal: Surrounding soft tissue structures are within normal limits. Upper chest: Lung apices show scarring emphysematous change. No acute abnormality is noted. Other: None IMPRESSION: CT of the head: Normal head CT. CT of the cervical spine: Degenerative change without acute abnormality. Electronically Signed   By: Inez Catalina M.D.   On: 06/11/2019 02:13   DG Pelvis Portable  Result Date: 06/11/2019 CLINICAL DATA:  Recent fall with pelvic pain EXAM: PORTABLE PELVIS 1-2 VIEWS COMPARISON:  03/20/2005 FINDINGS: Pelvic ring is intact. Mild degenerative changes of the hip joints are noted bilaterally. No acute fracture or dislocation is seen. No soft tissue abnormality is noted. IMPRESSION: No acute abnormality noted. Electronically Signed   By:  Inez Catalina M.D.   On: 06/11/2019 00:20   DG Chest Portable 1 View  Result Date: 06/11/2019 CLINICAL DATA:  Recent fall with shortness of breath and cough, initial encounter EXAM: PORTABLE CHEST 1 VIEW COMPARISON:  03/13/2019 FINDINGS: Cardiac shadows within normal limits. The lungs are well aerated bilaterally. Minimal basilar atelectasis is noted. Previously seen infiltrates have resolved. No sizable effusion is seen. No pneumothorax is noted. No acute bony abnormality is seen. IMPRESSION: Minimal bibasilar atelectasis. Previously seen infiltrates have resolved. Electronically Signed   By: Inez Catalina M.D.   On: 06/11/2019 00:17   DG Knee Left Port  Result Date: 06/11/2019 CLINICAL DATA:  Recent fall with knee pain, initial encounter EXAM: PORTABLE LEFT KNEE - 2 VIEW COMPARISON:  03/21/2005 FINDINGS: Changes consistent with prior fixation sideplate and multiple fixation screws are seen within the proximal tibia. No acute fracture or dislocation is noted. Degenerative changes are seen with small joint effusion. No other focal abnormality is noted. IMPRESSION: Degenerative and postsurgical changes.  No acute abnormality noted. Electronically Signed   By: Inez Catalina M.D.   On: 06/11/2019 00:18   DG Knee Right Port  Result Date: 06/11/2019 CLINICAL DATA:  Recent fall with right knee pain, initial encounter EXAM: PORTABLE RIGHT KNEE - 2 VIEW COMPARISON:  None. FINDINGS: No acute fracture or dislocation is noted. No joint effusion is seen. Very mild patellofemoral degenerative changes are seen. IMPRESSION: No acute abnormality noted. Electronically Signed   By: Inez Catalina M.D.   On: 06/11/2019 00:19    Cardiac Studies     Patient Profile     Scott [name] is a 59 y.o. male with no prior cardiac histoyr who is being seen today for the evaluation of elevated troponin at the request of Dr Roger Shelter.    Assessment & Plan    1. SIRS, possible sepsis with unclear source - presented hypoxic,  tachycardic, borderline hypotensive,  leukocytosis, lactic acidosis,  - emperic abx per primary team, no clear source of possible infection at this time.    2. Elevated troponin - in setting of severe SIRS, possibly sepsis. Probable demand ischemic in setting of hypoxia, tachycardia, lactic acidosis, borderline hypotension,and SIRs - no cardiac symptoms - EKG without ischemic changes - peak trop 213, trending down.   - we will f/u echo this AM, if no significant findings then we will sign off. If normal echo will not require outpatient cardiology f/u   3. HTN - presented initially borderline hypotensive in setting of SIRS, bp's have improved and actually hypertenstive -would start norvasc 5mg  daily. Admitted with AKI, overtime if renal function remains stable can transition to ACE ARB given his DM2 history   Addendum: normal echo with normal LVEF and no WMAs, we will sign off inpatient care. Does  not need outpatient cardiology follow up  For questions or updates, please contact [place] Please consult [email or web address] for contact info under        Signed, Carlyle Dolly, MD  06/12/2019, 9:13 AM

## 2019-06-12 NOTE — Progress Notes (Signed)
*  PRELIMINARY RESULTS* Echocardiogram 2D Echocardiogram has been performed.  Scott [name] 06/12/2019, 10:28 AM

## 2019-06-12 NOTE — TOC Progression Note (Signed)
Transition of Care [facility]) - Progression Note    Patient Details  Name: KOUPER BALDELLI MRN: [id number] Date of Birth: February 16, 1961  [facility]) CM/SW Contact  Boneta Lucks, RN Phone Number: 06/12/2019, 2:13 PM  Clinical Narrative:   Patient admitted with SIRS. Patient lives at [facility]. Discharge Planning for patient to return on Saturday.  Meds added to [facility], MD signed and CM faxed to [facility]. CM also called Karoline Caldwell - guardian ([employer]) and faxed her a copy of FL2 for his file.     Expected Discharge Plan: Assisted Living Barriers to Discharge: Continued Medical Work up  Expected Discharge Plan and Services Expected Discharge Plan: Assisted Living       Living arrangements for the past 2 months: [place]

## 2019-06-13 LAB — CBC
HCT: 39.8 % (ref 39.0–52.0)
Hemoglobin: 12.8 g/dL — ABNORMAL LOW (ref 13.0–17.0)
MCH: 31.3 pg (ref 26.0–34.0)
MCHC: 32.2 g/dL (ref 30.0–36.0)
MCV: 97.3 fL (ref 80.0–100.0)
Platelets: 282 10*3/uL (ref 150–400)
RBC: 4.09 MIL/uL — ABNORMAL LOW (ref 4.22–5.81)
RDW: 13.9 % (ref 11.5–15.5)
WBC: 8.6 10*3/uL (ref 4.0–10.5)
nRBC: 0 % (ref 0.0–0.2)

## 2019-06-13 LAB — CK: Total CK: 10414 U/L — ABNORMAL HIGH (ref 49–397)

## 2019-06-13 LAB — BASIC METABOLIC PANEL
Anion gap: 12 (ref 5–15)
BUN: 18 mg/dL (ref 6–20)
CO2: 17 mmol/L — ABNORMAL LOW (ref 22–32)
Calcium: 8.8 mg/dL — ABNORMAL LOW (ref 8.9–10.3)
Chloride: 110 mmol/L (ref 98–111)
Creatinine, Ser: 0.88 mg/dL (ref 0.61–1.24)
GFR calc Af Amer: >60 mL/min (ref >60)
GFR calc non Af Amer: >60 mL/min (ref >60)
Glucose, Bld: 191 mg/dL — ABNORMAL HIGH (ref 70–99)
Potassium: 4.3 mmol/L (ref 3.5–5.1)
Sodium: 139 mmol/L (ref 135–145)

## 2019-06-13 LAB — GLUCOSE, CAPILLARY
Glucose-Capillary: 193 mg/dL — ABNORMAL HIGH (ref 70–99)
Glucose-Capillary: 188 mg/dL — ABNORMAL HIGH (ref 70–99)
Glucose-Capillary: 185 mg/dL — ABNORMAL HIGH (ref 70–99)
Glucose-Capillary: 244 mg/dL — ABNORMAL HIGH (ref 70–99)

## 2019-06-13 MED ORDER — LACTATED RINGERS IV SOLN: INTRAVENOUS | Status: DC
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
## 2019-06-13 NOTE — TOC Progression Note (Signed)
Transition of Care [facility]) - Progression Note    Patient Details  Name: Scott [name] MRN: [id number] Date of Birth: 1960/04/15  Transition of [facility]) CM/SW Contact  Ihor Gully, LCSW Phone Number: 06/13/2019, 3:23 PM  Clinical Narrative:    Ardine Bjork at [facility] ALF that patient's CK level was up per attending and that he would not discharge today. Advised that he could potentially be ready for tomorrow.    Expected Discharge Plan: Assisted Living Barriers to Discharge: Continued Medical Work up  Expected Discharge Plan and Services Expected Discharge Plan: Assisted Living       Living arrangements for the past 2 months: Assisted Living Facility                                       Social Determinants of Health (SDOH) Interventions    Readmission Risk Interventions No flowsheet data found.

## 2019-06-14 LAB — BASIC METABOLIC PANEL
Anion gap: 8 (ref 5–15)
BUN: 13 mg/dL (ref 6–20)
CO2: 21 mmol/L — ABNORMAL LOW (ref 22–32)
Calcium: 8.6 mg/dL — ABNORMAL LOW (ref 8.9–10.3)
Chloride: 112 mmol/L — ABNORMAL HIGH (ref 98–111)
Creatinine, Ser: 0.69 mg/dL (ref 0.61–1.24)
GFR calc Af Amer: >60 mL/min (ref >60)
GFR calc non Af Amer: >60 mL/min (ref >60)
Glucose, Bld: 140 mg/dL — ABNORMAL HIGH (ref 70–99)
Potassium: 3.9 mmol/L (ref 3.5–5.1)
Sodium: 141 mmol/L (ref 135–145)

## 2019-06-14 LAB — GLUCOSE, CAPILLARY
Glucose-Capillary: 145 mg/dL — ABNORMAL HIGH (ref 70–99)
Glucose-Capillary: 179 mg/dL — ABNORMAL HIGH (ref 70–99)
Glucose-Capillary: 255 mg/dL — ABNORMAL HIGH (ref 70–99)
Glucose-Capillary: 184 mg/dL — ABNORMAL HIGH (ref 70–99)

## 2019-06-14 LAB — CK: Total CK: 12439 U/L — ABNORMAL HIGH (ref 49–397)

## 2019-06-14 MED ORDER — FUROSEMIDE 10 MG/ML IJ SOLN
40.0000 mg | Freq: Once | INTRAMUSCULAR | Status: AC
  Administered 2019-06-14: 40 mg via INTRAVENOUS
  Filled 2019-06-14: qty 4
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
## 2019-06-15 DIAGNOSIS — R296 Repeated falls: Secondary | ICD-10-CM | POA: Diagnosis not present

## 2019-06-15 DIAGNOSIS — M6281 Muscle weakness (generalized): Secondary | ICD-10-CM | POA: Diagnosis not present

## 2019-06-15 DIAGNOSIS — R269 Unspecified abnormalities of gait and mobility: Secondary | ICD-10-CM | POA: Diagnosis not present

## 2019-06-15 LAB — BASIC METABOLIC PANEL
Anion gap: 10 (ref 5–15)
BUN: 14 mg/dL (ref 6–20)
CO2: 23 mmol/L (ref 22–32)
Calcium: 8.8 mg/dL — ABNORMAL LOW (ref 8.9–10.3)
Chloride: 112 mmol/L — ABNORMAL HIGH (ref 98–111)
Creatinine, Ser: 0.78 mg/dL (ref 0.61–1.24)
GFR calc Af Amer: >60 mL/min (ref >60)
GFR calc non Af Amer: >60 mL/min (ref >60)
Glucose, Bld: 106 mg/dL — ABNORMAL HIGH (ref 70–99)
Potassium: 3.8 mmol/L (ref 3.5–5.1)
Sodium: 145 mmol/L (ref 135–145)

## 2019-06-15 LAB — GLUCOSE, CAPILLARY
Glucose-Capillary: 141 mg/dL — ABNORMAL HIGH (ref 70–99)
Glucose-Capillary: 209 mg/dL — ABNORMAL HIGH (ref 70–99)

## 2019-06-15 LAB — CK: Total CK: 6656 U/L — ABNORMAL HIGH (ref 49–397)

## 2019-06-15 MED ORDER — FUROSEMIDE 20 MG PO TABS
20.0000 mg | ORAL_TABLET | Freq: Once | ORAL | Status: AC
  Administered 2019-06-15: 20 mg via ORAL
  Filled 2019-06-15: qty 1

## 2019-06-15 MED ORDER — FUROSEMIDE 10 MG/ML IJ SOLN: 20.0000 mg | Freq: Once | INTRAMUSCULAR | Status: DC

## 2019-06-15 MED ORDER — AMLODIPINE BESYLATE 5 MG PO TABS: 5.0000 mg | ORAL_TABLET | Freq: Every day | ORAL | 0 refills | Status: DC

## 2019-06-15 NOTE — Progress Notes (Signed)
IV removed and discharge papers in packet.  To be picked up by [facility] staff.

## 2019-06-15 NOTE — Discharge Summary (Signed)
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 - Nasopharyngeal Swab     Status: None   Collection Time: 06/10/19 11:31 PM   Specimen: Nasopharyngeal Swab  Result Value Ref Range Status   SARS Coronavirus 2 by RT PCR NEGATIVE NEGATIVE Final    Comment: (NOTE) SARS-CoV-2 target nucleic acids are NOT DETECTED. The SARS-CoV-2 RNA is generally detectable in upper respiratoy specimens during the acute phase of infection. The lowest concentration of SARS-CoV-2 viral copies this assay can detect is 131 copies/mL. A negative result does not preclude SARS-Cov-2 infection and should not be used as the sole basis for treatment or other patient management decisions. A negative result may occur with  improper  specimen collection/handling, submission of specimen other than nasopharyngeal swab, presence of viral mutation(s) within the areas targeted by this assay, and inadequate number of viral copies (<131 copies/mL). A negative result must be combined with clinical observations, patient history, and epidemiological information. The expected result is Negative. Fact Sheet for Patients:  PinkCheek.be Fact Sheet for Healthcare Providers:  GravelBags.it This test is not yet ap proved or cleared by the [place] FDA and  has been authorized for detection and/or diagnosis of SARS-CoV-2 by FDA under an Emergency Use Authorization (EUA). This EUA will remain  in effect (meaning this test can be used) for the duration of the COVID-19 declaration under Section 564(b)(1) of the Act, 21 U.S.C. section 360bbb-3(b)(1), unless the authorization is terminated or revoked sooner.    Influenza A by PCR NEGATIVE NEGATIVE Final   Influenza B by PCR NEGATIVE NEGATIVE Final    Comment: (NOTE) The Xpert Xpress SARS-CoV-2/FLU/RSV assay is intended as an aid in  the diagnosis of influenza from Nasopharyngeal swab specimens and  should not be used as a sole basis for treatment. Nasal washings and  aspirates are unacceptable for Xpert Xpress SARS-CoV-2/FLU/RSV  testing. Fact Sheet for Patients: PinkCheek.be Fact Sheet for Healthcare Providers: GravelBags.it This test is not yet approved or cleared by the [place] and  has been authorized for detection and/or diagnosis of SARS-CoV-2 by  FDA under an Emergency Use Authorization (EUA). This EUA will remain  in effect (meaning this test can be used) for the duration of the  Covid-19 declaration under Section 564(b)(1) of the Act, 21  U.S.C. section 360bbb-3(b)(1), unless the authorization is  terminated or revoked. Performed at [facility], [address], [place], [place] [address]   Blood culture (routine x 2)     Status: None (Preliminary result)   Collection Time: 06/10/19 11:32 PM   Specimen: BLOOD LEFT HAND  Result Value Ref Range Status   Specimen Description BLOOD LEFT HAND  Final   Special Requests   Final    BOTTLES DRAWN AEROBIC AND ANAEROBIC Blood Culture adequate volume   Culture   Final    NO GROWTH 4 DAYS Performed at [facility], [address] [place], [place] [address]    Report Status PENDING  Incomplete  Urine Culture     Status: None   Collection Time: 06/11/19  1:08 AM   Specimen: Urine, Catheterized  Result Value Ref Range Status   Specimen Description   Final    URINE, CATHETERIZED Performed at [facility], [address] [place], [place] [address]    Special Requests   Final    NONE Performed at [facility], [address], [place], [place] [address]    Culture   Final    NO GROWTH Performed at [facility], [place] [address], [place], [place] [address]    Report Status 06/12/2019 FINAL  Final  MRSA PCR Screening     Status: None   Collection Time: 06/11/19  8:15 AM   Specimen: Nasopharyngeal  Result Value Ref Range Status   MRSA by PCR NEGATIVE NEGATIVE Final    Comment:        The GeneXpert MRSA Assay (FDA approved for NASAL specimens only), is one component of a comprehensive MRSA colonization surveillance program. It is not intended to diagnose MRSA infection nor to guide or monitor treatment for MRSA infections. Performed at [facility], [address] [place],  [address]      Time coordinating discharge: 12mins  SIGNED:   [name], MD  [facility] 06/15/2019, 10:54 AM   If 7PM-7AM, please contact night-coverage [email or web address]

## 2019-06-15 NOTE — TOC Transition Note (Addendum)
Transition of Care [facility]) - CM/SW Discharge Note   Patient Details  Name: DREZDEN SUFFRIDGE MRN: [id number] Date of Birth: Mar 21, 1961  Transition of Care [facility]) CM/SW Contact:  Shade Flood, LCSW Phone Number: 06/15/2019, 11:27 AM   Clinical Narrative:     TOC following. Pt stable for dc back to [facility] per MD. Damaris Schooner with staff at [facility] and they will come get pt between 11:30 and noon. Faxed dc clinical.  Referred to [facility] at request of Tammy from [facility].  Updated pt's RN. Message left for pt's guardian.  There are no other TOC needs for dc.   Final next level of care: Assisted Living Barriers to Discharge: Barriers Resolved   Patient Goals and CMS Choice Patient states their goals for this hospitalization and ongoing recovery are:: to go back to ALF CMS Medicare.gov Compare Post Acute Care list provided to:: Patient    Discharge Placement                       Discharge Plan and Services                                     Social Determinants of Health (SDOH) Interventions     Readmission Risk Interventions No flowsheet data found.

## 2019-06-15 NOTE — Care Management Important Message (Signed)
Important Message  Patient Details  Name: Scott [name] MRN: [id number] Date of Birth: [birth year]   Medicare Important Message Given:  Yes     Tommy Medal 06/15/2019, 12:06 PM
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
## 2019-06-16 DIAGNOSIS — F3160 Bipolar disorder, current episode mixed, unspecified: Secondary | ICD-10-CM | POA: Diagnosis not present

## 2019-06-16 DIAGNOSIS — Z794 Long term (current) use of insulin: Secondary | ICD-10-CM | POA: Diagnosis not present

## 2019-06-16 DIAGNOSIS — I10 Essential (primary) hypertension: Secondary | ICD-10-CM | POA: Diagnosis not present

## 2019-06-16 DIAGNOSIS — F17210 Nicotine dependence, cigarettes, uncomplicated: Secondary | ICD-10-CM | POA: Diagnosis not present

## 2019-06-16 DIAGNOSIS — M6281 Muscle weakness (generalized): Secondary | ICD-10-CM | POA: Diagnosis not present

## 2019-06-16 DIAGNOSIS — F79 Unspecified intellectual disabilities: Secondary | ICD-10-CM | POA: Diagnosis not present

## 2019-06-16 DIAGNOSIS — J449 Chronic obstructive pulmonary disease, unspecified: Secondary | ICD-10-CM | POA: Diagnosis not present

## 2019-06-16 DIAGNOSIS — S069X9S Unspecified intracranial injury with loss of consciousness of unspecified duration, sequela: Secondary | ICD-10-CM | POA: Diagnosis not present

## 2019-06-16 DIAGNOSIS — E1165 Type 2 diabetes mellitus with hyperglycemia: Secondary | ICD-10-CM | POA: Diagnosis not present

## 2019-06-16 DIAGNOSIS — M150 Primary generalized (osteo)arthritis: Secondary | ICD-10-CM | POA: Diagnosis not present

## 2019-06-16 DIAGNOSIS — Z9181 History of falling: Secondary | ICD-10-CM | POA: Diagnosis not present

## 2019-06-16 DIAGNOSIS — M6282 Rhabdomyolysis: Secondary | ICD-10-CM | POA: Diagnosis not present

## 2019-06-16 DIAGNOSIS — G40909 Epilepsy, unspecified, not intractable, without status epilepticus: Secondary | ICD-10-CM | POA: Diagnosis not present

## 2019-06-16 LAB — CULTURE, BLOOD (ROUTINE X 2)
Culture: NO GROWTH
Culture: NO GROWTH
Special Requests: ADEQUATE
Special Requests: ADEQUATE

## 2019-06-17 DIAGNOSIS — I10 Essential (primary) hypertension: Secondary | ICD-10-CM | POA: Diagnosis not present

## 2019-06-17 DIAGNOSIS — J449 Chronic obstructive pulmonary disease, unspecified: Secondary | ICD-10-CM | POA: Diagnosis not present

## 2019-06-17 DIAGNOSIS — M6282 Rhabdomyolysis: Secondary | ICD-10-CM | POA: Diagnosis not present

## 2019-06-17 DIAGNOSIS — M150 Primary generalized (osteo)arthritis: Secondary | ICD-10-CM | POA: Diagnosis not present

## 2019-06-17 DIAGNOSIS — M6281 Muscle weakness (generalized): Secondary | ICD-10-CM | POA: Diagnosis not present

## 2019-06-17 DIAGNOSIS — F17210 Nicotine dependence, cigarettes, uncomplicated: Secondary | ICD-10-CM | POA: Diagnosis not present

## 2019-06-19 DIAGNOSIS — M6282 Rhabdomyolysis: Secondary | ICD-10-CM | POA: Diagnosis not present

## 2019-06-19 DIAGNOSIS — J449 Chronic obstructive pulmonary disease, unspecified: Secondary | ICD-10-CM | POA: Diagnosis not present

## 2019-06-19 DIAGNOSIS — M150 Primary generalized (osteo)arthritis: Secondary | ICD-10-CM | POA: Diagnosis not present

## 2019-06-19 DIAGNOSIS — I10 Essential (primary) hypertension: Secondary | ICD-10-CM | POA: Diagnosis not present

## 2019-06-19 DIAGNOSIS — M6281 Muscle weakness (generalized): Secondary | ICD-10-CM | POA: Diagnosis not present

## 2019-06-19 DIAGNOSIS — F17210 Nicotine dependence, cigarettes, uncomplicated: Secondary | ICD-10-CM | POA: Diagnosis not present

## 2019-06-22 DIAGNOSIS — I10 Essential (primary) hypertension: Secondary | ICD-10-CM | POA: Diagnosis not present

## 2019-06-22 DIAGNOSIS — M6282 Rhabdomyolysis: Secondary | ICD-10-CM | POA: Diagnosis not present

## 2019-06-22 DIAGNOSIS — M150 Primary generalized (osteo)arthritis: Secondary | ICD-10-CM | POA: Diagnosis not present

## 2019-06-22 DIAGNOSIS — M6281 Muscle weakness (generalized): Secondary | ICD-10-CM | POA: Diagnosis not present

## 2019-06-22 DIAGNOSIS — F3163 Bipolar disorder, current episode mixed, severe, without psychotic features: Secondary | ICD-10-CM | POA: Diagnosis not present

## 2019-06-22 DIAGNOSIS — E872 Acidosis: Secondary | ICD-10-CM | POA: Diagnosis not present

## 2019-06-22 DIAGNOSIS — F17210 Nicotine dependence, cigarettes, uncomplicated: Secondary | ICD-10-CM | POA: Diagnosis not present

## 2019-06-22 DIAGNOSIS — R6510 Systemic inflammatory response syndrome (SIRS) of non-infectious origin without acute organ dysfunction: Secondary | ICD-10-CM | POA: Diagnosis not present

## 2019-06-22 DIAGNOSIS — J449 Chronic obstructive pulmonary disease, unspecified: Secondary | ICD-10-CM | POA: Diagnosis not present

## 2019-06-23 DIAGNOSIS — M6282 Rhabdomyolysis: Secondary | ICD-10-CM | POA: Diagnosis not present

## 2019-06-23 DIAGNOSIS — J449 Chronic obstructive pulmonary disease, unspecified: Secondary | ICD-10-CM | POA: Diagnosis not present

## 2019-06-23 DIAGNOSIS — F17210 Nicotine dependence, cigarettes, uncomplicated: Secondary | ICD-10-CM | POA: Diagnosis not present

## 2019-06-23 DIAGNOSIS — M6281 Muscle weakness (generalized): Secondary | ICD-10-CM | POA: Diagnosis not present

## 2019-06-23 DIAGNOSIS — M150 Primary generalized (osteo)arthritis: Secondary | ICD-10-CM | POA: Diagnosis not present

## 2019-06-23 DIAGNOSIS — I10 Essential (primary) hypertension: Secondary | ICD-10-CM | POA: Diagnosis not present

## 2019-06-25 DIAGNOSIS — M6282 Rhabdomyolysis: Secondary | ICD-10-CM | POA: Diagnosis not present

## 2019-06-25 DIAGNOSIS — F17210 Nicotine dependence, cigarettes, uncomplicated: Secondary | ICD-10-CM | POA: Diagnosis not present

## 2019-06-25 DIAGNOSIS — M6281 Muscle weakness (generalized): Secondary | ICD-10-CM | POA: Diagnosis not present

## 2019-06-25 DIAGNOSIS — I10 Essential (primary) hypertension: Secondary | ICD-10-CM | POA: Diagnosis not present

## 2019-06-25 DIAGNOSIS — E1165 Type 2 diabetes mellitus with hyperglycemia: Secondary | ICD-10-CM | POA: Diagnosis not present

## 2019-06-25 DIAGNOSIS — M150 Primary generalized (osteo)arthritis: Secondary | ICD-10-CM | POA: Diagnosis not present

## 2019-06-25 DIAGNOSIS — J449 Chronic obstructive pulmonary disease, unspecified: Secondary | ICD-10-CM | POA: Diagnosis not present

## 2019-06-29 DIAGNOSIS — M150 Primary generalized (osteo)arthritis: Secondary | ICD-10-CM | POA: Diagnosis not present

## 2019-06-29 DIAGNOSIS — F17210 Nicotine dependence, cigarettes, uncomplicated: Secondary | ICD-10-CM | POA: Diagnosis not present

## 2019-06-29 DIAGNOSIS — J449 Chronic obstructive pulmonary disease, unspecified: Secondary | ICD-10-CM | POA: Diagnosis not present

## 2019-06-29 DIAGNOSIS — I10 Essential (primary) hypertension: Secondary | ICD-10-CM | POA: Diagnosis not present

## 2019-06-29 DIAGNOSIS — M6281 Muscle weakness (generalized): Secondary | ICD-10-CM | POA: Diagnosis not present

## 2019-06-29 DIAGNOSIS — M6282 Rhabdomyolysis: Secondary | ICD-10-CM | POA: Diagnosis not present

## 2019-06-30 DIAGNOSIS — I10 Essential (primary) hypertension: Secondary | ICD-10-CM | POA: Diagnosis not present

## 2019-06-30 DIAGNOSIS — M6282 Rhabdomyolysis: Secondary | ICD-10-CM | POA: Diagnosis not present

## 2019-06-30 DIAGNOSIS — M150 Primary generalized (osteo)arthritis: Secondary | ICD-10-CM | POA: Diagnosis not present

## 2019-06-30 DIAGNOSIS — J449 Chronic obstructive pulmonary disease, unspecified: Secondary | ICD-10-CM | POA: Diagnosis not present

## 2019-06-30 DIAGNOSIS — F17210 Nicotine dependence, cigarettes, uncomplicated: Secondary | ICD-10-CM | POA: Diagnosis not present

## 2019-06-30 DIAGNOSIS — M6281 Muscle weakness (generalized): Secondary | ICD-10-CM | POA: Diagnosis not present

## 2019-07-06 DIAGNOSIS — M150 Primary generalized (osteo)arthritis: Secondary | ICD-10-CM | POA: Diagnosis not present

## 2019-07-06 DIAGNOSIS — M6281 Muscle weakness (generalized): Secondary | ICD-10-CM | POA: Diagnosis not present

## 2019-07-06 DIAGNOSIS — M6282 Rhabdomyolysis: Secondary | ICD-10-CM | POA: Diagnosis not present

## 2019-07-06 DIAGNOSIS — I10 Essential (primary) hypertension: Secondary | ICD-10-CM | POA: Diagnosis not present

## 2019-07-06 DIAGNOSIS — F17210 Nicotine dependence, cigarettes, uncomplicated: Secondary | ICD-10-CM | POA: Diagnosis not present

## 2019-07-06 DIAGNOSIS — J449 Chronic obstructive pulmonary disease, unspecified: Secondary | ICD-10-CM | POA: Diagnosis not present

## 2019-07-07 DIAGNOSIS — Z20828 Contact with and (suspected) exposure to other viral communicable diseases: Secondary | ICD-10-CM | POA: Diagnosis not present

## 2019-07-07 DIAGNOSIS — U071 COVID-19: Secondary | ICD-10-CM | POA: Diagnosis not present

## 2019-07-08 DIAGNOSIS — M150 Primary generalized (osteo)arthritis: Secondary | ICD-10-CM | POA: Diagnosis not present

## 2019-07-08 DIAGNOSIS — M6281 Muscle weakness (generalized): Secondary | ICD-10-CM | POA: Diagnosis not present

## 2019-07-08 DIAGNOSIS — M6282 Rhabdomyolysis: Secondary | ICD-10-CM | POA: Diagnosis not present

## 2019-07-08 DIAGNOSIS — J449 Chronic obstructive pulmonary disease, unspecified: Secondary | ICD-10-CM | POA: Diagnosis not present

## 2019-07-08 DIAGNOSIS — I10 Essential (primary) hypertension: Secondary | ICD-10-CM | POA: Diagnosis not present

## 2019-07-08 DIAGNOSIS — F17210 Nicotine dependence, cigarettes, uncomplicated: Secondary | ICD-10-CM | POA: Diagnosis not present

## 2019-07-10 DIAGNOSIS — J449 Chronic obstructive pulmonary disease, unspecified: Secondary | ICD-10-CM | POA: Diagnosis not present

## 2019-07-10 DIAGNOSIS — M6281 Muscle weakness (generalized): Secondary | ICD-10-CM | POA: Diagnosis not present

## 2019-07-10 DIAGNOSIS — F17210 Nicotine dependence, cigarettes, uncomplicated: Secondary | ICD-10-CM | POA: Diagnosis not present

## 2019-07-10 DIAGNOSIS — M6282 Rhabdomyolysis: Secondary | ICD-10-CM | POA: Diagnosis not present

## 2019-07-10 DIAGNOSIS — M150 Primary generalized (osteo)arthritis: Secondary | ICD-10-CM | POA: Diagnosis not present

## 2019-07-10 DIAGNOSIS — I10 Essential (primary) hypertension: Secondary | ICD-10-CM | POA: Diagnosis not present

## 2019-07-13 DIAGNOSIS — M6282 Rhabdomyolysis: Secondary | ICD-10-CM | POA: Diagnosis not present

## 2019-07-13 DIAGNOSIS — J449 Chronic obstructive pulmonary disease, unspecified: Secondary | ICD-10-CM | POA: Diagnosis not present

## 2019-07-13 DIAGNOSIS — F17210 Nicotine dependence, cigarettes, uncomplicated: Secondary | ICD-10-CM | POA: Diagnosis not present

## 2019-07-13 DIAGNOSIS — I10 Essential (primary) hypertension: Secondary | ICD-10-CM | POA: Diagnosis not present

## 2019-07-13 DIAGNOSIS — M150 Primary generalized (osteo)arthritis: Secondary | ICD-10-CM | POA: Diagnosis not present

## 2019-07-13 DIAGNOSIS — M6281 Muscle weakness (generalized): Secondary | ICD-10-CM | POA: Diagnosis not present

## 2019-07-14 DIAGNOSIS — L110 Acquired keratosis follicularis: Secondary | ICD-10-CM | POA: Diagnosis not present

## 2019-07-14 DIAGNOSIS — L609 Nail disorder, unspecified: Secondary | ICD-10-CM | POA: Diagnosis not present

## 2019-07-14 DIAGNOSIS — E1140 Type 2 diabetes mellitus with diabetic neuropathy, unspecified: Secondary | ICD-10-CM | POA: Diagnosis not present

## 2019-07-14 DIAGNOSIS — E1142 Type 2 diabetes mellitus with diabetic polyneuropathy: Secondary | ICD-10-CM | POA: Diagnosis not present

## 2019-07-16 DIAGNOSIS — F3160 Bipolar disorder, current episode mixed, unspecified: Secondary | ICD-10-CM | POA: Diagnosis not present

## 2019-07-16 DIAGNOSIS — E1165 Type 2 diabetes mellitus with hyperglycemia: Secondary | ICD-10-CM | POA: Diagnosis not present

## 2019-07-16 DIAGNOSIS — S069X9S Unspecified intracranial injury with loss of consciousness of unspecified duration, sequela: Secondary | ICD-10-CM | POA: Diagnosis not present

## 2019-07-16 DIAGNOSIS — Z794 Long term (current) use of insulin: Secondary | ICD-10-CM | POA: Diagnosis not present

## 2019-07-16 DIAGNOSIS — I10 Essential (primary) hypertension: Secondary | ICD-10-CM | POA: Diagnosis not present

## 2019-07-16 DIAGNOSIS — G40909 Epilepsy, unspecified, not intractable, without status epilepticus: Secondary | ICD-10-CM | POA: Diagnosis not present

## 2019-07-16 DIAGNOSIS — M6281 Muscle weakness (generalized): Secondary | ICD-10-CM | POA: Diagnosis not present

## 2019-07-16 DIAGNOSIS — J449 Chronic obstructive pulmonary disease, unspecified: Secondary | ICD-10-CM | POA: Diagnosis not present

## 2019-07-16 DIAGNOSIS — F79 Unspecified intellectual disabilities: Secondary | ICD-10-CM | POA: Diagnosis not present

## 2019-07-16 DIAGNOSIS — M150 Primary generalized (osteo)arthritis: Secondary | ICD-10-CM | POA: Diagnosis not present

## 2019-07-16 DIAGNOSIS — Z9181 History of falling: Secondary | ICD-10-CM | POA: Diagnosis not present

## 2019-07-16 DIAGNOSIS — F17210 Nicotine dependence, cigarettes, uncomplicated: Secondary | ICD-10-CM | POA: Diagnosis not present

## 2019-07-16 DIAGNOSIS — M6282 Rhabdomyolysis: Secondary | ICD-10-CM | POA: Diagnosis not present

## 2019-07-20 DIAGNOSIS — M6282 Rhabdomyolysis: Secondary | ICD-10-CM | POA: Diagnosis not present

## 2019-07-20 DIAGNOSIS — M150 Primary generalized (osteo)arthritis: Secondary | ICD-10-CM | POA: Diagnosis not present

## 2019-07-20 DIAGNOSIS — U071 COVID-19: Secondary | ICD-10-CM | POA: Diagnosis not present

## 2019-07-20 DIAGNOSIS — J449 Chronic obstructive pulmonary disease, unspecified: Secondary | ICD-10-CM | POA: Diagnosis not present

## 2019-07-20 DIAGNOSIS — Z20828 Contact with and (suspected) exposure to other viral communicable diseases: Secondary | ICD-10-CM | POA: Diagnosis not present

## 2019-07-20 DIAGNOSIS — F17210 Nicotine dependence, cigarettes, uncomplicated: Secondary | ICD-10-CM | POA: Diagnosis not present

## 2019-07-20 DIAGNOSIS — M6281 Muscle weakness (generalized): Secondary | ICD-10-CM | POA: Diagnosis not present

## 2019-07-20 DIAGNOSIS — I10 Essential (primary) hypertension: Secondary | ICD-10-CM | POA: Diagnosis not present

## 2019-07-22 DIAGNOSIS — J449 Chronic obstructive pulmonary disease, unspecified: Secondary | ICD-10-CM | POA: Diagnosis not present

## 2019-07-22 DIAGNOSIS — I10 Essential (primary) hypertension: Secondary | ICD-10-CM | POA: Diagnosis not present

## 2019-07-22 DIAGNOSIS — F17210 Nicotine dependence, cigarettes, uncomplicated: Secondary | ICD-10-CM | POA: Diagnosis not present

## 2019-07-22 DIAGNOSIS — M150 Primary generalized (osteo)arthritis: Secondary | ICD-10-CM | POA: Diagnosis not present

## 2019-07-22 DIAGNOSIS — M6281 Muscle weakness (generalized): Secondary | ICD-10-CM | POA: Diagnosis not present

## 2019-07-22 DIAGNOSIS — M6282 Rhabdomyolysis: Secondary | ICD-10-CM | POA: Diagnosis not present

## 2019-07-25 DIAGNOSIS — E1165 Type 2 diabetes mellitus with hyperglycemia: Secondary | ICD-10-CM | POA: Diagnosis not present

## 2019-07-25 DIAGNOSIS — J219 Acute bronchiolitis, unspecified: Secondary | ICD-10-CM | POA: Diagnosis not present

## 2019-07-27 DIAGNOSIS — U071 COVID-19: Secondary | ICD-10-CM | POA: Diagnosis not present

## 2019-07-27 DIAGNOSIS — M6281 Muscle weakness (generalized): Secondary | ICD-10-CM | POA: Diagnosis not present

## 2019-07-27 DIAGNOSIS — Z20828 Contact with and (suspected) exposure to other viral communicable diseases: Secondary | ICD-10-CM | POA: Diagnosis not present

## 2019-07-27 DIAGNOSIS — M6282 Rhabdomyolysis: Secondary | ICD-10-CM | POA: Diagnosis not present

## 2019-07-27 DIAGNOSIS — J449 Chronic obstructive pulmonary disease, unspecified: Secondary | ICD-10-CM | POA: Diagnosis not present

## 2019-07-27 DIAGNOSIS — I10 Essential (primary) hypertension: Secondary | ICD-10-CM | POA: Diagnosis not present

## 2019-07-27 DIAGNOSIS — M150 Primary generalized (osteo)arthritis: Secondary | ICD-10-CM | POA: Diagnosis not present

## 2019-07-27 DIAGNOSIS — F17210 Nicotine dependence, cigarettes, uncomplicated: Secondary | ICD-10-CM | POA: Diagnosis not present

## 2019-07-29 DIAGNOSIS — M6281 Muscle weakness (generalized): Secondary | ICD-10-CM | POA: Diagnosis not present

## 2019-07-29 DIAGNOSIS — M150 Primary generalized (osteo)arthritis: Secondary | ICD-10-CM | POA: Diagnosis not present

## 2019-07-29 DIAGNOSIS — J449 Chronic obstructive pulmonary disease, unspecified: Secondary | ICD-10-CM | POA: Diagnosis not present

## 2019-07-29 DIAGNOSIS — I10 Essential (primary) hypertension: Secondary | ICD-10-CM | POA: Diagnosis not present

## 2019-07-29 DIAGNOSIS — F17210 Nicotine dependence, cigarettes, uncomplicated: Secondary | ICD-10-CM | POA: Diagnosis not present

## 2019-07-29 DIAGNOSIS — M6282 Rhabdomyolysis: Secondary | ICD-10-CM | POA: Diagnosis not present

## 2019-08-07 ENCOUNTER — Ambulatory Visit: Payer: Medicare Other | Admitting: "Endocrinology

## 2019-08-12 DIAGNOSIS — R2689 Other abnormalities of gait and mobility: Secondary | ICD-10-CM | POA: Diagnosis not present

## 2019-08-12 DIAGNOSIS — R278 Other lack of coordination: Secondary | ICD-10-CM | POA: Diagnosis not present

## 2019-08-12 DIAGNOSIS — M6281 Muscle weakness (generalized): Secondary | ICD-10-CM | POA: Diagnosis not present

## 2019-08-17 DIAGNOSIS — R2689 Other abnormalities of gait and mobility: Secondary | ICD-10-CM | POA: Diagnosis not present

## 2019-08-17 DIAGNOSIS — M6281 Muscle weakness (generalized): Secondary | ICD-10-CM | POA: Diagnosis not present

## 2019-08-17 DIAGNOSIS — R278 Other lack of coordination: Secondary | ICD-10-CM | POA: Diagnosis not present

## 2019-08-19 DIAGNOSIS — R2689 Other abnormalities of gait and mobility: Secondary | ICD-10-CM | POA: Diagnosis not present

## 2019-08-19 DIAGNOSIS — R278 Other lack of coordination: Secondary | ICD-10-CM | POA: Diagnosis not present

## 2019-08-19 DIAGNOSIS — M6281 Muscle weakness (generalized): Secondary | ICD-10-CM | POA: Diagnosis not present

## 2019-08-24 DIAGNOSIS — M549 Dorsalgia, unspecified: Secondary | ICD-10-CM | POA: Diagnosis not present

## 2019-08-24 DIAGNOSIS — F319 Bipolar disorder, unspecified: Secondary | ICD-10-CM | POA: Diagnosis not present

## 2019-08-31 DIAGNOSIS — M6281 Muscle weakness (generalized): Secondary | ICD-10-CM | POA: Diagnosis not present

## 2019-08-31 DIAGNOSIS — R2689 Other abnormalities of gait and mobility: Secondary | ICD-10-CM | POA: Diagnosis not present

## 2019-08-31 DIAGNOSIS — R278 Other lack of coordination: Secondary | ICD-10-CM | POA: Diagnosis not present

## 2019-09-02 DIAGNOSIS — M6281 Muscle weakness (generalized): Secondary | ICD-10-CM | POA: Diagnosis not present

## 2019-09-02 DIAGNOSIS — R2689 Other abnormalities of gait and mobility: Secondary | ICD-10-CM | POA: Diagnosis not present

## 2019-09-02 DIAGNOSIS — R278 Other lack of coordination: Secondary | ICD-10-CM | POA: Diagnosis not present

## 2019-09-14 ENCOUNTER — Ambulatory Visit (INDEPENDENT_AMBULATORY_CARE_PROVIDER_SITE_OTHER): Payer: Medicare Other | Admitting: "Endocrinology

## 2019-09-14 ENCOUNTER — Other Ambulatory Visit: Payer: Self-pay

## 2019-09-14 ENCOUNTER — Encounter: Payer: Self-pay | Admitting: "Endocrinology

## 2019-09-14 VITALS — BP 141/98 | HR 87 | Ht 69.0 in | Wt 220.8 lb

## 2019-09-14 DIAGNOSIS — E1165 Type 2 diabetes mellitus with hyperglycemia: Secondary | ICD-10-CM | POA: Diagnosis not present

## 2019-09-14 DIAGNOSIS — I10 Essential (primary) hypertension: Secondary | ICD-10-CM

## 2019-09-14 DIAGNOSIS — E782 Mixed hyperlipidemia: Secondary | ICD-10-CM

## 2019-09-14 LAB — POCT GLYCOSYLATED HEMOGLOBIN (HGB A1C): Hemoglobin A1C: 7.9 % — AB (ref 4.0–5.6)
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
## 2019-09-14 NOTE — Patient Instructions (Signed)

## 2019-09-17 DIAGNOSIS — I739 Peripheral vascular disease, unspecified: Secondary | ICD-10-CM | POA: Diagnosis not present

## 2019-09-17 DIAGNOSIS — F3163 Bipolar disorder, current episode mixed, severe, without psychotic features: Secondary | ICD-10-CM | POA: Diagnosis not present

## 2019-09-17 DIAGNOSIS — Z1331 Encounter for screening for depression: Secondary | ICD-10-CM | POA: Diagnosis not present

## 2019-09-17 DIAGNOSIS — E1165 Type 2 diabetes mellitus with hyperglycemia: Secondary | ICD-10-CM | POA: Diagnosis not present

## 2019-09-17 DIAGNOSIS — Z1389 Encounter for screening for other disorder: Secondary | ICD-10-CM | POA: Diagnosis not present

## 2019-09-17 DIAGNOSIS — J449 Chronic obstructive pulmonary disease, unspecified: Secondary | ICD-10-CM | POA: Diagnosis not present

## 2019-10-21 DIAGNOSIS — F310 Bipolar disorder, current episode hypomanic: Secondary | ICD-10-CM | POA: Diagnosis not present

## 2019-11-16 DIAGNOSIS — I739 Peripheral vascular disease, unspecified: Secondary | ICD-10-CM | POA: Diagnosis not present

## 2019-11-16 DIAGNOSIS — E1165 Type 2 diabetes mellitus with hyperglycemia: Secondary | ICD-10-CM | POA: Diagnosis not present

## 2019-11-16 DIAGNOSIS — R600 Localized edema: Secondary | ICD-10-CM | POA: Diagnosis not present

## 2019-11-20 DIAGNOSIS — E1165 Type 2 diabetes mellitus with hyperglycemia: Secondary | ICD-10-CM | POA: Diagnosis not present

## 2019-11-20 DIAGNOSIS — R600 Localized edema: Secondary | ICD-10-CM | POA: Diagnosis not present

## 2019-11-20 DIAGNOSIS — N390 Urinary tract infection, site not specified: Secondary | ICD-10-CM | POA: Diagnosis not present

## 2019-11-22 ENCOUNTER — Emergency Department (HOSPITAL_COMMUNITY): Payer: Medicare Other

## 2019-11-22 ENCOUNTER — Inpatient Hospital Stay (HOSPITAL_COMMUNITY): Payer: Medicare Other

## 2019-11-22 ENCOUNTER — Encounter (HOSPITAL_COMMUNITY): Payer: Self-pay | Admitting: Emergency Medicine

## 2019-11-22 ENCOUNTER — Other Ambulatory Visit: Payer: Self-pay

## 2019-11-22 ENCOUNTER — Inpatient Hospital Stay (HOSPITAL_COMMUNITY)
Admission: EM | Admit: 2019-11-22 | Discharge: 2019-11-24 | DRG: 189 | Disposition: A | Discharge status: Home Health | Payer: Medicare Other | Source: Skilled Nursing Facility | Attending: Internal Medicine | Admitting: Internal Medicine

## 2019-11-22 DIAGNOSIS — R4781 Slurred speech: Secondary | ICD-10-CM | POA: Diagnosis not present

## 2019-11-22 DIAGNOSIS — R531 Weakness: Secondary | ICD-10-CM

## 2019-11-22 DIAGNOSIS — E782 Mixed hyperlipidemia: Secondary | ICD-10-CM | POA: Diagnosis present

## 2019-11-22 DIAGNOSIS — J9601 Acute respiratory failure with hypoxia: Secondary | ICD-10-CM | POA: Diagnosis not present

## 2019-11-22 DIAGNOSIS — Z20822 Contact with and (suspected) exposure to covid-19: Secondary | ICD-10-CM | POA: Diagnosis present

## 2019-11-22 DIAGNOSIS — Z720 Tobacco use: Secondary | ICD-10-CM

## 2019-11-22 DIAGNOSIS — D649 Anemia, unspecified: Secondary | ICD-10-CM | POA: Diagnosis present

## 2019-11-22 DIAGNOSIS — Z79891 Long term (current) use of opiate analgesic: Secondary | ICD-10-CM | POA: Diagnosis not present

## 2019-11-22 DIAGNOSIS — Z8616 Personal history of COVID-19: Secondary | ICD-10-CM

## 2019-11-22 DIAGNOSIS — K760 Fatty (change of) liver, not elsewhere classified: Secondary | ICD-10-CM | POA: Diagnosis present

## 2019-11-22 DIAGNOSIS — Z794 Long term (current) use of insulin: Secondary | ICD-10-CM

## 2019-11-22 DIAGNOSIS — F79 Unspecified intellectual disabilities: Secondary | ICD-10-CM | POA: Diagnosis present

## 2019-11-22 DIAGNOSIS — E872 Acidosis, unspecified: Secondary | ICD-10-CM

## 2019-11-22 DIAGNOSIS — R0602 Shortness of breath: Secondary | ICD-10-CM

## 2019-11-22 DIAGNOSIS — E1165 Type 2 diabetes mellitus with hyperglycemia: Secondary | ICD-10-CM | POA: Diagnosis present

## 2019-11-22 DIAGNOSIS — J449 Chronic obstructive pulmonary disease, unspecified: Secondary | ICD-10-CM | POA: Diagnosis present

## 2019-11-22 DIAGNOSIS — N390 Urinary tract infection, site not specified: Secondary | ICD-10-CM

## 2019-11-22 DIAGNOSIS — Z79899 Other long term (current) drug therapy: Secondary | ICD-10-CM

## 2019-11-22 DIAGNOSIS — I2510 Atherosclerotic heart disease of native coronary artery without angina pectoris: Secondary | ICD-10-CM | POA: Diagnosis not present

## 2019-11-22 DIAGNOSIS — E860 Dehydration: Secondary | ICD-10-CM | POA: Diagnosis present

## 2019-11-22 DIAGNOSIS — E785 Hyperlipidemia, unspecified: Secondary | ICD-10-CM | POA: Diagnosis not present

## 2019-11-22 DIAGNOSIS — Z743 Need for continuous supervision: Secondary | ICD-10-CM | POA: Diagnosis not present

## 2019-11-22 DIAGNOSIS — R7989 Other specified abnormal findings of blood chemistry: Secondary | ICD-10-CM

## 2019-11-22 DIAGNOSIS — J439 Emphysema, unspecified: Secondary | ICD-10-CM | POA: Diagnosis not present

## 2019-11-22 DIAGNOSIS — R6889 Other general symptoms and signs: Secondary | ICD-10-CM | POA: Diagnosis not present

## 2019-11-22 DIAGNOSIS — M7989 Other specified soft tissue disorders: Secondary | ICD-10-CM

## 2019-11-22 DIAGNOSIS — U071 COVID-19: Secondary | ICD-10-CM | POA: Diagnosis not present

## 2019-11-22 DIAGNOSIS — I10 Essential (primary) hypertension: Secondary | ICD-10-CM | POA: Diagnosis present

## 2019-11-22 DIAGNOSIS — R9431 Abnormal electrocardiogram [ECG] [EKG]: Secondary | ICD-10-CM

## 2019-11-22 DIAGNOSIS — R000 Tachycardia, unspecified: Secondary | ICD-10-CM | POA: Diagnosis not present

## 2019-11-22 DIAGNOSIS — J9811 Atelectasis: Secondary | ICD-10-CM | POA: Diagnosis not present

## 2019-11-22 DIAGNOSIS — R600 Localized edema: Secondary | ICD-10-CM | POA: Diagnosis not present

## 2019-11-22 DIAGNOSIS — M47814 Spondylosis without myelopathy or radiculopathy, thoracic region: Secondary | ICD-10-CM | POA: Diagnosis not present

## 2019-11-22 DIAGNOSIS — F3160 Bipolar disorder, current episode mixed, unspecified: Secondary | ICD-10-CM | POA: Diagnosis present

## 2019-11-22 DIAGNOSIS — S069XAA Unspecified intracranial injury with loss of consciousness status unknown, initial encounter: Secondary | ICD-10-CM | POA: Diagnosis present

## 2019-11-22 DIAGNOSIS — Z87820 Personal history of traumatic brain injury: Secondary | ICD-10-CM | POA: Diagnosis not present

## 2019-11-22 DIAGNOSIS — S069X9A Unspecified intracranial injury with loss of consciousness of unspecified duration, initial encounter: Secondary | ICD-10-CM | POA: Diagnosis present

## 2019-11-22 DIAGNOSIS — R0902 Hypoxemia: Secondary | ICD-10-CM | POA: Diagnosis not present

## 2019-11-22 LAB — VALPROIC ACID LEVEL: Valproic Acid Lvl: 28 ug/mL — ABNORMAL LOW (ref 50.0–100.0)

## 2019-11-22 LAB — LITHIUM LEVEL: Lithium Lvl: 0.54 mmol/L — ABNORMAL LOW (ref 0.60–1.20)

## 2019-11-22 LAB — LACTIC ACID, PLASMA
Lactic Acid, Venous: 3.6 mmol/L (ref 0.5–1.9)
Lactic Acid, Venous: 2.4 mmol/L (ref 0.5–1.9)
Lactic Acid, Venous: 2.5 mmol/L (ref 0.5–1.9)
Lactic Acid, Venous: 2.3 mmol/L (ref 0.5–1.9)
Lactic Acid, Venous: 2.5 mmol/L (ref 0.5–1.9)
Lactic Acid, Venous: 2.3 mmol/L (ref 0.5–1.9)
Lactic Acid, Venous: 2.2 mmol/L (ref 0.5–1.9)

## 2019-11-22 LAB — HEMOGLOBIN A1C
Hgb A1c MFr Bld: 7.5 % — ABNORMAL HIGH (ref 4.8–5.6)
Mean Plasma Glucose: 168.55 mg/dL

## 2019-11-22 LAB — URINALYSIS, ROUTINE W REFLEX MICROSCOPIC
Bilirubin Urine: NEGATIVE
Glucose, UA: 250 mg/dL — AB
Hgb urine dipstick: NEGATIVE
Ketones, ur: NEGATIVE mg/dL
Leukocytes,Ua: NEGATIVE
Nitrite: NEGATIVE
Protein, ur: 30 mg/dL — AB
Specific Gravity, Urine: <1.005 — ABNORMAL LOW (ref 1.005–1.030)
pH: 6.5 (ref 5.0–8.0)

## 2019-11-22 LAB — COMPREHENSIVE METABOLIC PANEL
ALT: 25 U/L (ref 0–44)
ALT: 27 U/L (ref 0–44)
AST: 37 U/L (ref 15–41)
AST: 36 U/L (ref 15–41)
Albumin: 3.6 g/dL (ref 3.5–5.0)
Albumin: 3.7 g/dL (ref 3.5–5.0)
Alkaline Phosphatase: 72 U/L (ref 38–126)
Alkaline Phosphatase: 79 U/L (ref 38–126)
Anion gap: 12 (ref 5–15)
Anion gap: 13 (ref 5–15)
BUN: 26 mg/dL — ABNORMAL HIGH (ref 6–20)
BUN: 16 mg/dL (ref 6–20)
CO2: 23 mmol/L (ref 22–32)
CO2: 21 mmol/L — ABNORMAL LOW (ref 22–32)
Calcium: 9.2 mg/dL (ref 8.9–10.3)
Calcium: 9 mg/dL (ref 8.9–10.3)
Chloride: 105 mmol/L (ref 98–111)
Chloride: 107 mmol/L (ref 98–111)
Creatinine, Ser: 1.23 mg/dL (ref 0.61–1.24)
Creatinine, Ser: 0.98 mg/dL (ref 0.61–1.24)
GFR calc Af Amer: >60 mL/min (ref >60)
GFR calc Af Amer: >60 mL/min (ref >60)
GFR calc non Af Amer: >60 mL/min (ref >60)
GFR calc non Af Amer: >60 mL/min (ref >60)
Glucose, Bld: 235 mg/dL — ABNORMAL HIGH (ref 70–99)
Glucose, Bld: 213 mg/dL — ABNORMAL HIGH (ref 70–99)
Potassium: 4 mmol/L (ref 3.5–5.1)
Potassium: 4 mmol/L (ref 3.5–5.1)
Sodium: 140 mmol/L (ref 135–145)
Sodium: 141 mmol/L (ref 135–145)
Total Bilirubin: 0.4 mg/dL (ref 0.3–1.2)
Total Bilirubin: 0.5 mg/dL (ref 0.3–1.2)
Total Protein: 7.3 g/dL (ref 6.5–8.1)
Total Protein: 7.6 g/dL (ref 6.5–8.1)

## 2019-11-22 LAB — CBC WITH DIFFERENTIAL/PLATELET
Abs Immature Granulocytes: 0.04 10*3/uL (ref 0.00–0.07)
Basophils Absolute: 0.1 10*3/uL (ref 0.0–0.1)
Basophils Relative: 1 %
Eosinophils Absolute: 0.2 10*3/uL (ref 0.0–0.5)
Eosinophils Relative: 2 %
HCT: 40.3 % (ref 39.0–52.0)
Hemoglobin: 12.8 g/dL — ABNORMAL LOW (ref 13.0–17.0)
Immature Granulocytes: 0 %
Lymphocytes Relative: 21 %
Lymphs Abs: 2 10*3/uL (ref 0.7–4.0)
MCH: 30.4 pg (ref 26.0–34.0)
MCHC: 31.8 g/dL (ref 30.0–36.0)
MCV: 95.7 fL (ref 80.0–100.0)
Monocytes Absolute: 0.8 10*3/uL (ref 0.1–1.0)
Monocytes Relative: 8 %
Neutro Abs: 6.5 10*3/uL (ref 1.7–7.7)
Neutrophils Relative %: 68 %
Platelets: 246 10*3/uL (ref 150–400)
RBC: 4.21 MIL/uL — ABNORMAL LOW (ref 4.22–5.81)
RDW: 14.4 % (ref 11.5–15.5)
WBC: 9.6 10*3/uL (ref 4.0–10.5)
nRBC: 0 % (ref 0.0–0.2)

## 2019-11-22 LAB — GLUCOSE, CAPILLARY: Glucose-Capillary: 193 mg/dL — ABNORMAL HIGH (ref 70–99)

## 2019-11-22 LAB — TROPONIN I (HIGH SENSITIVITY)
Troponin I (High Sensitivity): 6 ng/L (ref <18)
Troponin I (High Sensitivity): 7 ng/L (ref <18)

## 2019-11-22 LAB — URINALYSIS, MICROSCOPIC (REFLEX)

## 2019-11-22 LAB — BRAIN NATRIURETIC PEPTIDE: B Natriuretic Peptide: 23 pg/mL (ref 0.0–100.0)

## 2019-11-22 LAB — D-DIMER, QUANTITATIVE: D-Dimer, Quant: 0.53 ug/mL-FEU — ABNORMAL HIGH (ref 0.00–0.50)

## 2019-11-22 LAB — CBG MONITORING, ED
Glucose-Capillary: 183 mg/dL — ABNORMAL HIGH (ref 70–99)
Glucose-Capillary: 218 mg/dL — ABNORMAL HIGH (ref 70–99)
Glucose-Capillary: 233 mg/dL — ABNORMAL HIGH (ref 70–99)

## 2019-11-22 LAB — BLOOD GAS, ARTERIAL
Acid-base deficit: 0.9 mmol/L (ref 0.0–2.0)
Bicarbonate: 23.1 mmol/L (ref 20.0–28.0)
FIO2: 44
O2 Saturation: 93.7 %
Patient temperature: 37
pCO2 arterial: 37.7 mmHg (ref 32.0–48.0)
pH, Arterial: 7.405 (ref 7.350–7.450)
pO2, Arterial: 75.4 mmHg — ABNORMAL LOW (ref 83.0–108.0)

## 2019-11-22 LAB — MAGNESIUM: Magnesium: 2.1 mg/dL (ref 1.7–2.4)

## 2019-11-22 LAB — SARS CORONAVIRUS 2 BY RT PCR (HOSPITAL ORDER, PERFORMED IN ~~LOC~~ HOSPITAL LAB): SARS Coronavirus 2: NEGATIVE

## 2019-11-22 LAB — PREALBUMIN: Prealbumin: 30.8 mg/dL (ref 18–38)

## 2019-11-22 LAB — PROCALCITONIN: Procalcitonin: 0.12 ng/mL

## 2019-11-22 LAB — PHOSPHORUS: Phosphorus: 4.1 mg/dL (ref 2.5–4.6)

## 2019-11-22 MED ORDER — INSULIN ASPART 100 UNIT/ML ~~LOC~~ SOLN
0.0000 [IU] | Freq: Three times a day (TID) | SUBCUTANEOUS | Status: DC
  Administered 2019-11-22: 3 [IU] via SUBCUTANEOUS
  Administered 2019-11-22 – 2019-11-23 (×2): 5 [IU] via SUBCUTANEOUS
  Administered 2019-11-23 (×2): 3 [IU] via SUBCUTANEOUS
  Administered 2019-11-24: 8 [IU] via SUBCUTANEOUS
  Administered 2019-11-24: 5 [IU] via SUBCUTANEOUS
  Filled 2019-11-22 (×2): qty 1

## 2019-11-22 MED ORDER — UMECLIDINIUM BROMIDE 62.5 MCG/INH IN AEPB
1.0000 | INHALATION_SPRAY | Freq: Every day | RESPIRATORY_TRACT | Status: DC
  Administered 2019-11-23 – 2019-11-24 (×2): 1 via RESPIRATORY_TRACT
  Filled 2019-11-22: qty 7

## 2019-11-22 MED ORDER — MELATONIN 3 MG PO TABS
3.0000 mg | ORAL_TABLET | Freq: Every day | ORAL | Status: DC
  Administered 2019-11-22 – 2019-11-23 (×2): 3 mg via ORAL
  Filled 2019-11-22 (×2): qty 1

## 2019-11-22 MED ORDER — AMLODIPINE BESYLATE 5 MG PO TABS
5.0000 mg | ORAL_TABLET | Freq: Every day | ORAL | Status: DC
  Administered 2019-11-23 – 2019-11-24 (×2): 5 mg via ORAL
  Filled 2019-11-22 (×2): qty 1

## 2019-11-22 MED ORDER — ALBUTEROL SULFATE (2.5 MG/3ML) 0.083% IN NEBU: 2.5000 mg | INHALATION_SOLUTION | RESPIRATORY_TRACT | Status: DC | PRN

## 2019-11-22 MED ORDER — TIOTROPIUM BROMIDE MONOHYDRATE 18 MCG IN CAPS: 18.0000 ug | ORAL_CAPSULE | Freq: Every day | RESPIRATORY_TRACT | Status: DC

## 2019-11-22 MED ORDER — ALBUTEROL SULFATE HFA 108 (90 BASE) MCG/ACT IN AERS: 2.0000 | INHALATION_SPRAY | RESPIRATORY_TRACT | Status: DC | PRN

## 2019-11-22 MED ORDER — IOHEXOL 350 MG/ML SOLN
100.0000 mL | Freq: Once | INTRAVENOUS | Status: AC | PRN
  Administered 2019-11-22: 100 mL via INTRAVENOUS

## 2019-11-22 MED ORDER — SODIUM CHLORIDE 0.9 % IV SOLN
1.0000 g | INTRAVENOUS | Status: DC
  Administered 2019-11-23: 1 g via INTRAVENOUS
  Filled 2019-11-22: qty 10

## 2019-11-22 MED ORDER — METOPROLOL TARTRATE 25 MG PO TABS
12.5000 mg | ORAL_TABLET | Freq: Two times a day (BID) | ORAL | Status: DC
  Administered 2019-11-22 – 2019-11-24 (×4): 12.5 mg via ORAL
  Filled 2019-11-22 (×4): qty 1

## 2019-11-22 MED ORDER — SODIUM CHLORIDE 0.9 % IV SOLN
1.0000 g | Freq: Once | INTRAVENOUS | Status: AC
  Administered 2019-11-22: 1 g via INTRAVENOUS
  Filled 2019-11-22: qty 10

## 2019-11-22 MED ORDER — INSULIN ASPART 100 UNIT/ML ~~LOC~~ SOLN: 0.0000 [IU] | Freq: Every day | SUBCUTANEOUS | Status: DC

## 2019-11-22 MED ORDER — GEMFIBROZIL 600 MG PO TABS
600.0000 mg | ORAL_TABLET | Freq: Two times a day (BID) | ORAL | Status: DC
  Administered 2019-11-22 – 2019-11-24 (×4): 600 mg via ORAL
  Filled 2019-11-22 (×4): qty 1

## 2019-11-22 MED ORDER — SODIUM CHLORIDE 0.9 % IV BOLUS
1000.0000 mL | Freq: Once | INTRAVENOUS | Status: AC
  Administered 2019-11-22: 1000 mL via INTRAVENOUS

## 2019-11-22 MED ORDER — SODIUM CHLORIDE 0.9 % IV SOLN: INTRAVENOUS | Status: DC

## 2019-11-22 MED ORDER — ENOXAPARIN SODIUM 40 MG/0.4ML ~~LOC~~ SOLN
40.0000 mg | Freq: Every day | SUBCUTANEOUS | Status: DC
  Administered 2019-11-22 – 2019-11-24 (×3): 40 mg via SUBCUTANEOUS
  Filled 2019-11-22 (×3): qty 0.4

## 2019-11-22 NOTE — Progress Notes (Signed)
Brief note: -Patient was admitted earlier today. -As H&P done earlier today " Scott [name] is a 59 y.o. male with medical history significant for osteoarthritis, bipolar 1 disorder, depression, COPD, CHF type 2 diabetes, MR/traumatic brain injury, recent COVID-19 virus infection who presents to the emergency department via EMS due to generalized weakness. Patient complained of having painful urination and left lower back pain for several days and it was noted that he had a UTI, so he was placed on Cipro for the past 2 days. Per ED medical record, EMS was activated at the nursing home facility due to suspicion for possible stroke with slurred speech and generalized weakness, however on arrival of EMS, ED did not have any stroke deficits and patient passed this drug screen, was alert and oriented to time, place and person. However, his O2 sat was 89%. Patient had Covid last year and has had Covid vaccines. He denies nausea, vomiting, headache, blurry vision or abdominal pain.  ED Course:  In the emergency department, he was tachycardic and was requiring supplemental oxygen at 3 LPM to maintain O2 sat of 94%. Work-up in the ED showed normocytic anemia, hyperglycemia, urinalysis was negative, D-dimer 0.53, lactic acid 3.6>2.4. ABG was done and showed hypoxia. SARS coronavirus 2 was negative. CT head without contrast showed no acute intracranial abnormality. CT angiography showed no evidence of pulmonary emboli but showed slight increase in bibasilar atelectasis. Chest x-ray showed no active disease. IV ceftriaxone due to presumed UTI was given, patient was provided with IV hydration of 1 L of NS. Hospitalist was asked to admit patient for further evaluation and management".  11/23/2019: Patient seen.  Work-up done so far reviewed.  Lactic acid remains elevated.  Patient is volume depleted.  Will start patient on IV fluids.  Will repeat lactic acid.  Will check procalcitonin.  Continue IV ceftriaxone for now.   Hopefully, the low O2 sat noted earlier was just secondary to mucous plug.  Repeat chest x-ray in the morning. Follow cultures. Follow-up procalcitonin level. -Check albumin level.  Suspect ineffective circulatory arterial volume.  Further management depend on hospital course.

## 2019-11-22 NOTE — ED Triage Notes (Signed)
[facility] - pt c/o weakness that started yesterday. Pt recently started on Cipro for a kidney infection.

## 2019-11-22 NOTE — ED Notes (Signed)
Date and time results received: 11/22/19 0155 (use smartphrase ".now" to insert current time)  Test: 3.6 Critical Value: Lactic Acid  Name of Provider Notified: Rancour  Orders Received? Or Actions Taken?
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
## 2019-11-22 NOTE — ED Notes (Signed)
CRITICAL VALUE ALERT  Critical Value:  Lactic 2.4  Date & Time Notied:  11/22/2019 0351  Provider Notified: Dr. Wyvonnia Dusky  Orders Received/Actions taken: see chart
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
## 2019-11-23 ENCOUNTER — Inpatient Hospital Stay (HOSPITAL_COMMUNITY): Payer: Medicare Other

## 2019-11-23 DIAGNOSIS — J9601 Acute respiratory failure with hypoxia: Secondary | ICD-10-CM | POA: Diagnosis not present

## 2019-11-23 LAB — COMPREHENSIVE METABOLIC PANEL
ALT: 22 U/L (ref 0–44)
AST: 29 U/L (ref 15–41)
Albumin: 3.2 g/dL — ABNORMAL LOW (ref 3.5–5.0)
Alkaline Phosphatase: 72 U/L (ref 38–126)
Anion gap: 10 (ref 5–15)
BUN: 17 mg/dL (ref 6–20)
CO2: 20 mmol/L — ABNORMAL LOW (ref 22–32)
Calcium: 8.3 mg/dL — ABNORMAL LOW (ref 8.9–10.3)
Chloride: 109 mmol/L (ref 98–111)
Creatinine, Ser: 1.05 mg/dL (ref 0.61–1.24)
GFR calc Af Amer: >60 mL/min (ref >60)
GFR calc non Af Amer: >60 mL/min (ref >60)
Glucose, Bld: 213 mg/dL — ABNORMAL HIGH (ref 70–99)
Potassium: 4.3 mmol/L (ref 3.5–5.1)
Sodium: 139 mmol/L (ref 135–145)
Total Bilirubin: 0.6 mg/dL (ref 0.3–1.2)
Total Protein: 6.8 g/dL (ref 6.5–8.1)

## 2019-11-23 LAB — CBC
HCT: 39.8 % (ref 39.0–52.0)
Hemoglobin: 12.4 g/dL — ABNORMAL LOW (ref 13.0–17.0)
MCH: 30.2 pg (ref 26.0–34.0)
MCHC: 31.2 g/dL (ref 30.0–36.0)
MCV: 96.8 fL (ref 80.0–100.0)
Platelets: 248 10*3/uL (ref 150–400)
RBC: 4.11 MIL/uL — ABNORMAL LOW (ref 4.22–5.81)
RDW: 14.4 % (ref 11.5–15.5)
WBC: 9.2 10*3/uL (ref 4.0–10.5)
nRBC: 0 % (ref 0.0–0.2)

## 2019-11-23 LAB — APTT: aPTT: 32 seconds (ref 24–36)

## 2019-11-23 LAB — PROTIME-INR
INR: 1.1 (ref 0.8–1.2)
Prothrombin Time: 13.5 seconds (ref 11.4–15.2)

## 2019-11-23 LAB — URINE CULTURE: Culture: NO GROWTH

## 2019-11-23 LAB — GLUCOSE, CAPILLARY
Glucose-Capillary: 199 mg/dL — ABNORMAL HIGH (ref 70–99)
Glucose-Capillary: 215 mg/dL — ABNORMAL HIGH (ref 70–99)
Glucose-Capillary: 173 mg/dL — ABNORMAL HIGH (ref 70–99)
Glucose-Capillary: 178 mg/dL — ABNORMAL HIGH (ref 70–99)

## 2019-11-23 LAB — PHOSPHORUS: Phosphorus: 3.1 mg/dL (ref 2.5–4.6)

## 2019-11-23 LAB — MAGNESIUM: Magnesium: 1.8 mg/dL (ref 1.7–2.4)

## 2019-11-23 LAB — TSH: TSH: 3.228 u[IU]/mL (ref 0.350–4.500)

## 2019-11-23 MED ORDER — ACETAMINOPHEN 325 MG PO TABS: 650.0000 mg | ORAL_TABLET | Freq: Four times a day (QID) | ORAL | Status: DC | PRN

## 2019-11-23 MED ORDER — POLYETHYLENE GLYCOL 3350 17 G PO PACK: 17.0000 g | PACK | Freq: Every day | ORAL | Status: DC | PRN

## 2019-11-23 MED ORDER — INSULIN ASPART 100 UNIT/ML ~~LOC~~ SOLN
3.0000 [IU] | Freq: Three times a day (TID) | SUBCUTANEOUS | Status: DC
  Administered 2019-11-23 – 2019-11-24 (×3): 3 [IU] via SUBCUTANEOUS

## 2019-11-23 MED ORDER — SENNOSIDES-DOCUSATE SODIUM 8.6-50 MG PO TABS: 2.0000 | ORAL_TABLET | Freq: Every evening | ORAL | Status: DC | PRN

## 2019-11-23 MED ORDER — IPRATROPIUM-ALBUTEROL 0.5-2.5 (3) MG/3ML IN SOLN: 3.0000 mL | RESPIRATORY_TRACT | Status: DC | PRN

## 2019-11-23 MED ORDER — CHLORHEXIDINE GLUCONATE CLOTH 2 % EX PADS
6.0000 | MEDICATED_PAD | Freq: Every day | CUTANEOUS | Status: DC
  Administered 2019-11-23 – 2019-11-24 (×2): 6 via TOPICAL

## 2019-11-23 NOTE — Progress Notes (Addendum)
Initial Nutrition Assessment  DOCUMENTATION CODES:   Obesity unspecified  INTERVENTION:  Results of Calorie count to follow at completion   NUTRITION DIAGNOSIS:   Predicted suboptimal nutrient intake related to acute illness (UTI on admission) as evidenced by  (volume depletion).  GOAL:  Patient will meet greater than or equal to 90% of their needs   MONITOR:  PO intake, Labs, Supplement acceptance, Weight trends, I & O's   REASON FOR ASSESSMENT:   Consult Calorie Count  ASSESSMENT: Patient is an obese 59 yo male. He presents from ALF with UTI and volume depleted. History of COPD, CHF, DM2 and MR/TBI. CT of head and chest x-ray- no acute findings.   Patient up in chair. No family at bedside. He is complaining of being hungry and is ready for lunch. Patient from [facility] and is accustomed to eating three meals daily. Denies problem with appetite and says he is eating all of my food. 48-hr Calorie count ordered per MD. Starting today at lunch. Nursing and dietary staff informed.   Weight history review: usual range 96-100 kg the past year.  Medications reviewed and include: novolog, miralax, senna-docusate.  Labs: BMP Latest Ref Rng & Units 11/23/2019 11/22/2019 11/22/2019  Glucose 70 - 99 mg/dL 213(H) 213(H) 235(H)  BUN 6 - 20 mg/dL 17 16 26(H)  Creatinine 0.61 - 1.24 mg/dL 1.05 0.98 1.23  Sodium 135 - 145 mmol/L 139 141 140  Potassium 3.5 - 5.1 mmol/L 4.3 4.0 4.0  Chloride 98 - 111 mmol/L 109 107 105  CO2 22 - 32 mmol/L 20(L) 21(L) 23  Calcium 8.9 - 10.3 mg/dL 8.3(L) 9.0 9.2    NUTRITION - FOCUSED PHYSICAL EXAM: Nutrition-Focused physical exam findings are mild orbital  fat depletion, mild clavicle muscle depletion, and mild BLE edema.     Diet Order:   Diet Order            Diet heart healthy/carb modified Room service appropriate? Yes; Fluid consistency: Thin  Diet effective now                 EDUCATION NEEDS:   Not appropriate for education at this  time Skin:  Skin Assessment: Reviewed RN Assessment  Last BM:  8/14  Height:   Ht Readings from Last 1 Encounters:  11/22/19 5\' 9"  (1.753 m)    Weight:   Wt Readings from Last 1 Encounters:  11/22/19 100.2 kg    Ideal Body Weight:   66 kg  BMI:  Body mass index is 32.62 kg/m.  Estimated Nutritional Needs:   Kcal:  8590-9311  Protein:  118-125  Fluid:  <2 liters daily   Colman Cater MS,RD,CSG,LDN Pager: Shea Evans
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
## 2019-11-23 NOTE — Progress Notes (Signed)
Foley cath removed per order at 1235. Pt given urinal to use.

## 2019-11-23 NOTE — Evaluation (Addendum)
Physical Therapy Evaluation Patient Details Name: Scott [name] MRN: [id number] DOB: 12/19/1960 Today's Date: 11/23/2019   History of Present Illness  Scott [name] is a 59 y.o. male with medical history significant for osteoarthritis, bipolar 1 disorder, depression, COPD, CHF type 2 diabetes, MR/traumatic brain injury, recent COVID-19 virus infection who presents to the emergency department via EMS due to generalized weakness. Patient complained of having painful urination and left lower back pain for several days and it was noted that he had a UTI, so he was placed on Cipro for the past 2 days. Per ED medical record, EMS was activated at the nursing home facility due to suspicion for possible stroke with slurred speech and generalized weakness, however on arrival of EMS, ED did not have any stroke deficits and patient passed this drug screen, was alert and oriented to time, place and person. However, his O2 sat was 89%. Patient had Covid last year and has had Covid vaccines. He denies nausea, vomiting, headache, blurry vision or abdominal pain.    Clinical Impression  The patient's O2 sat on room air was 94% at rest, 90% during ambulation, and 88% after ambulation. The patient was dizzy upon standing, which resolved within a 2 minutes. We were able to ambulate at a distance equal to his baseline with no loss of balance or increased dizziness from when he first stood. Patient tolerated sitting up in chair with call bell within arm's reach after therapy. PLAN: The patient will continue to benefit from skilled physical therapy services in hospital at recommended venue below in order to improve balance, gait, and ADL's to promote independence in functional activities.      Follow Up Recommendations Home health PT    Equipment Recommendations    None recommended by PT.    Recommendations for Other Services   None recommended by PT.     Precautions / Restrictions Precautions Precautions:  Fall Restrictions Weight Bearing Restrictions: No      Mobility  Bed Mobility Overal bed mobility: Independent                Transfers Overall transfer level: Modified independent Equipment used: Rolling walker (2 wheeled)             General transfer comment: dizziness upon standing, resolved within 2 minutes  Ambulation/Gait Ambulation/Gait assistance: Min guard;Supervision Gait Distance (Feet): 120 Feet Assistive device: Rolling walker (2 wheeled) Gait Pattern/deviations: WFL(Within Functional Limits);Trunk flexed;Shuffle        Stairs            Wheelchair Mobility    Modified Rankin (Stroke Patients Only)       Balance Overall balance assessment: Modified Independent                                           Pertinent Vitals/Pain Pain Assessment: No/denies pain    Home Living Family/patient expects to be discharged to:: Assisted living               Home Equipment: Walker - 2 wheels;Shower seat Additional Comments: He can get AD if needed    Prior Function Level of Independence: Needs assistance   Gait / Transfers Assistance Needed: patient ambulates up an down halls at living facility  ADL's / Homemaking Assistance Needed: assisted by staff as needed for ADL  Comments: reports he does not use AD at home  Hand Dominance   Dominant Hand: Right    Extremity/Trunk Assessment   Upper Extremity Assessment Upper Extremity Assessment: Overall WFL for tasks assessed    Lower Extremity Assessment Lower Extremity Assessment: Overall WFL for tasks assessed    Cervical / Trunk Assessment Cervical / Trunk Assessment: Normal  Communication      Cognition Arousal/Alertness: Awake/alert Behavior During Therapy: WFL for tasks assessed/performed Overall Cognitive Status: History of cognitive impairments - at baseline                                        General Comments       Exercises     Assessment/Plan    PT Assessment Patient needs continued PT services  PT Problem List Decreased cognition;Decreased safety awareness;Decreased balance;Decreased knowledge of precautions;Decreased mobility;Cardiopulmonary status limiting activity;Decreased knowledge of use of DME       PT Treatment Interventions DME instruction;Balance training;Gait training;Functional mobility training;Therapeutic activities;Therapeutic exercise;Patient/family education    PT Goals (Current goals can be found in the Care Plan section)  Acute Rehab PT Goals Patient Stated Goal: return to ALF PT Goal Formulation: With patient Time For Goal Achievement: 11/26/19 Potential to Achieve Goals: Good    Frequency Min 3X/week   Barriers to discharge        Co-evaluation               AM-PAC PT "6 Clicks" Mobility  Outcome Measure Help needed turning from your back to your side while in a flat bed without using bedrails?: None Help needed moving from lying on your back to sitting on the side of a flat bed without using bedrails?: None Help needed moving to and from a bed to a chair (including a wheelchair)?: A Little Help needed standing up from a chair using your arms (e.g., wheelchair or bedside chair)?: A Little Help needed to walk in hospital room?: A Little Help needed climbing 3-5 steps with a railing? : A Lot 6 Click Score: 19    End of Session Equipment Utilized During Treatment: Gait belt Activity Tolerance: Patient tolerated treatment well Patient left: in chair;with call bell/phone within reach;with chair alarm set Nurse Communication: Mobility status PT Visit Diagnosis: Unsteadiness on feet (R26.81);Other abnormalities of gait and mobility (R26.89);Repeated falls (R29.6);History of falling (Z91.81);Difficulty in walking, not elsewhere classified (R26.2)    Time: 9528-4132 PT Time Calculation (min) (ACUTE ONLY): 14 min   Charges:   PT Evaluation $PT Eval Moderate  Complexity: 1 Mod PT Treatments $Therapeutic Activity: 8-22 mins        3:16 PM , 11/23/19 Karlyn Agee, SPT Physical Therapy with [facility] [phone] office      This qualified practitioner was present in the room guiding the student in service delivery. Therapy student was participating in the provision of services, and the practitioner was not engaged in treating another patient or doing other tasks at the same time. 3:18 PM, 11/23/19 Mearl Latin PT, DPT Physical Therapist at [facility]
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
## 2019-11-23 NOTE — Progress Notes (Signed)
Inpatient Diabetes Program Recommendations  AACE/ADA: New Consensus Statement on Inpatient Glycemic Control   Target Ranges:  Prepandial:   less than 140 mg/dL      Peak postprandial:   less than 180 mg/dL (1-2 hours)      Critically ill patients:  140 - 180 mg/dL   Results for ROSCOE, WITTS (MRN [id number]) as of 11/23/2019 11:58  Ref. Range 11/22/2019 00:36 11/22/2019 12:19 11/22/2019 17:25 11/22/2019 20:49 11/23/2019 07:22 11/23/2019 11:23  Glucose-Capillary Latest Ref Range: 70 - 99 mg/dL 233 (H) 183 (H) 218 (H) 193 (H) 199 (H) 215 (H)   Review of Glycemic Control  Diabetes history: DM2 Outpatient Diabetes medications: Lantus 40 units QHS, Tradjenat 5 mg daily, Metformin 1000 mg BID Current orders for Inpatient glycemic control: Novolog 0-15 units TID with meals, Novolog 0-5 units QHS  Inpatient Diabetes Program Recommendations:    Insulin-Please consider ordering Novolog 3 units TID with meals for meal coverage if patient eats at least 50% of meals.  Thanks, Barnie Alderman, RN, MSN, CDE Diabetes Coordinator Inpatient Diabetes Program [phone] (Team Pager from 8am to 5pm)

## 2019-11-23 NOTE — Plan of Care (Addendum)
  Problem: Acute Rehab PT Goals(only PT should resolve) Goal: Pt Will Go Supine/Side To Sit Outcome: Progressing Flowsheets (Taken 11/23/2019 1509) Pt will go Supine/Side to Sit: Independently Goal: Patient Will Transfer Sit To/From Stand Outcome: Progressing Flowsheets (Taken 11/23/2019 1509) Patient will transfer sit to/from stand: with modified independence Goal: Pt Will Transfer Bed To Chair/Chair To Bed Outcome: Progressing Flowsheets (Taken 11/23/2019 1509) Pt will Transfer Bed to Chair/Chair to Bed: with modified independence Goal: Pt Will Ambulate Outcome: Progressing Flowsheets (Taken 11/23/2019 1509) Pt will Ambulate: . > 125 feet . with modified independence . with least restrictive assistive device   3:09 PM , 11/23/19 Karlyn Agee, SPT Physical Therapy with [facility] [phone] office   3:19 PM, 11/23/19 Mearl Latin PT, DPT Physical Therapist at [facility]

## 2019-11-24 DIAGNOSIS — J9601 Acute respiratory failure with hypoxia: Secondary | ICD-10-CM | POA: Diagnosis not present

## 2019-11-24 LAB — BASIC METABOLIC PANEL
Anion gap: 8 (ref 5–15)
BUN: 15 mg/dL (ref 6–20)
CO2: 21 mmol/L — ABNORMAL LOW (ref 22–32)
Calcium: 8.6 mg/dL — ABNORMAL LOW (ref 8.9–10.3)
Chloride: 112 mmol/L — ABNORMAL HIGH (ref 98–111)
Creatinine, Ser: 0.91 mg/dL (ref 0.61–1.24)
GFR calc Af Amer: >60 mL/min (ref >60)
GFR calc non Af Amer: >60 mL/min (ref >60)
Glucose, Bld: 216 mg/dL — ABNORMAL HIGH (ref 70–99)
Potassium: 5 mmol/L (ref 3.5–5.1)
Sodium: 141 mmol/L (ref 135–145)

## 2019-11-24 LAB — CBC
HCT: 40.5 % (ref 39.0–52.0)
Hemoglobin: 12.6 g/dL — ABNORMAL LOW (ref 13.0–17.0)
MCH: 30.2 pg (ref 26.0–34.0)
MCHC: 31.1 g/dL (ref 30.0–36.0)
MCV: 97.1 fL (ref 80.0–100.0)
Platelets: 249 10*3/uL (ref 150–400)
RBC: 4.17 MIL/uL — ABNORMAL LOW (ref 4.22–5.81)
RDW: 14.4 % (ref 11.5–15.5)
WBC: 8.1 10*3/uL (ref 4.0–10.5)
nRBC: 0 % (ref 0.0–0.2)

## 2019-11-24 LAB — GLUCOSE, CAPILLARY
Glucose-Capillary: 254 mg/dL — ABNORMAL HIGH (ref 70–99)
Glucose-Capillary: 205 mg/dL — ABNORMAL HIGH (ref 70–99)

## 2019-11-24 LAB — MAGNESIUM: Magnesium: 1.9 mg/dL (ref 1.7–2.4)

## 2019-11-24 MED ORDER — GEMFIBROZIL 600 MG PO TABS: 600.0000 mg | ORAL_TABLET | Freq: Two times a day (BID) | ORAL | Status: DC

## 2019-11-24 NOTE — TOC Transition Note (Signed)
Transition of Care [facility]) - CM/SW Discharge Note  Patient Details  Name: Scott [name] MRN: [id number] Date of Birth: [birth year]  Transition of Care [facility]) CM/SW Contact:  Sherie Don, LCSW Phone Number: 11/24/2019, 11:37 AM  Clinical Narrative: CSW notified patient will need HH for PT/OT. CSW called [facility] and spoke to [place]. Gabriel Cirri confirmed [facility] uses [facility] and provided fax number for FL2. FL2 completed and faxed to [facility]. CSW made [facility] referral to Cassie with [facility]. RN provided with main number to [facility] to call and set up transportation once patient is ready to discharge. TOC signing off.  Final next level of care: Assisted Living Barriers to Discharge: Barriers Resolved  Patient Goals and CMS Choice Patient states their goals for this hospitalization and ongoing recovery are:: Discharge to [facility] with [facility] CMS Medicare.gov Compare Post Acute Care list provided to:: Patient Represenative (must comment) Choice offered to / list presented to : [facility] POA / [place]  Discharge Placement Patient to be transferred to facility by: [facility] Name of family member notified: Karoline Caldwell (legal guardian) Patient and family notified of of transfer: 11/24/19  Discharge Plan and Services In-house Referral: Clinical Social Work Discharge Planning Services: NA         DME Arranged: N/A DME Agency: NA HH Arranged: PT, OT [place] Date [place]: 11/24/19 Time [place]: 1129 Representative spoke with at [place]: Cassie  Readmission Risk Interventions Readmission Risk Prevention Plan 11/23/2019  Transportation Screening Complete  PCP or Specialist Appt within 3-5 Days Not Complete  Not Complete comments Patient is a resident of [facility]; staff will schedule his f/u appointment  [place] or [place] Complete  Social Work Consult for [place] Planning/Counseling Complete  Palliative Care Screening  Not Applicable  Medication Review Press photographer) Complete  Some recent data might be hidden
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
## 2019-11-24 NOTE — Progress Notes (Signed)
Physical Therapy Treatment Patient Details Name: Scott [name] MRN: [id number] DOB: [birth year] Today's Date: 11/24/2019    History of Present Illness Scott [name] is a 59 y.o. male with medical history significant for osteoarthritis, bipolar 1 disorder, depression, COPD, CHF type 2 diabetes, MR/traumatic brain injury, recent COVID-19 virus infection who presents to the emergency department via EMS due to generalized weakness. Patient complained of having painful urination and left lower back pain for several days and it was noted that he had a UTI, so he was placed on Cipro for the past 2 days. Per ED medical record, EMS was activated at the nursing home facility due to suspicion for possible stroke with slurred speech and generalized weakness, however on arrival of EMS, ED did not have any stroke deficits and patient passed this drug screen, was alert and oriented to time, place and person. However, his O2 sat was 89%. Patient had Covid last year and has had Covid vaccines. He denies nausea, vomiting, headache, blurry vision or abdominal pain.    PT Comments    Pt tolerates seated therapeutic exercise with cues for decreased momentum to improve motor control and strengthening. Pt tolerates ambulation around room and in hallway, able to safely pass objects, navigate RW in tight spaces, and no LOB or unsteadiness noted. Pt uses flat foot gait pattern decreasing heel strike/toe off and decreased knee flexion/extension throughout gait cycle, without improvements when cued so possibly baseline gait pattern. Pt on RA with SpO2 96-97% after ambulation and no SOB noted. Pt reports feeling much better from admission time. Pt tolerates remaining up in chair, chair alarm on and call bell in lap.Patient will benefit from continued physical therapy in hospital and recommendations below to increase strength, balance, endurance for safe ADLs and gait.    Follow Up Recommendations  Home health PT      Equipment Recommendations  None recommended by PT    Recommendations for Other Services       Precautions / Restrictions Precautions Precautions: Fall Restrictions Weight Bearing Restrictions: No    Mobility  Bed Mobility    General bed mobility comments: in chair upon arrival  Transfers Overall transfer level: Needs assistance Equipment used: Rolling walker (2 wheeled) Transfers: Sit to/from Stand Sit to Stand: Supervision  General transfer comment: able to rise with BUE assisting, good steadiness upon rising, denies dizziness or lightheadedness  Ambulation/Gait Ambulation/Gait assistance: Supervision Gait Distance (Feet): 150 Feet Assistive device: Rolling walker (2 wheeled) Gait Pattern/deviations: WFL(Within Functional Limits);Decreased dorsiflexion - right;Decreased dorsiflexion - left Gait velocity: WFL   General Gait Details: flat foot posture with decreased knee flexion/extension throughout gait cycle and decreased heel strike/toe off, no unsteadiness or LOB with ambulation in hallway or while navigating past furniture in room   Stairs             Wheelchair Mobility    Modified Rankin (Stroke Patients Only)       Balance Overall balance assessment: Modified Independent         Cognition Arousal/Alertness: Awake/alert Behavior During Therapy: WFL for tasks assessed/performed Overall Cognitive Status: History of cognitive impairments - at baseline       Exercises General Exercises - Lower Extremity Long Arc Quad: AROM;Strengthening;Seated;Both;15 reps Hip Flexion/Marching: AROM;Strengthening;Seated;Both;15 reps Toe Raises: AROM;Strengthening;Seated;Both;15 reps Heel Raises: AROM;Strengthening;Seated;Both;15 reps    General Comments        Pertinent Vitals/Pain Pain Assessment: No/denies pain    Home Living  Prior Function            PT Goals (current goals can now be found in the care plan section)  Acute Rehab PT Goals Patient Stated Goal: return to ALF PT Goal Formulation: With patient Time For Goal Achievement: 11/26/19 Potential to Achieve Goals: Good Progress towards PT goals: Progressing toward goals    Frequency    Min 3X/week      PT Plan Current plan remains appropriate    Co-evaluation              AM-PAC PT "6 Clicks" Mobility   Outcome Measure  Help needed turning from your back to your side while in a flat bed without using bedrails?: None Help needed moving from lying on your back to sitting on the side of a flat bed without using bedrails?: None Help needed moving to and from a bed to a chair (including a wheelchair)?: None Help needed standing up from a chair using your arms (e.g., wheelchair or bedside chair)?: None Help needed to walk in hospital room?: None Help needed climbing 3-5 steps with a railing? : A Little 6 Click Score: 23    End of Session Equipment Utilized During Treatment: Gait belt Activity Tolerance: Patient tolerated treatment well Patient left: in chair;with call bell/phone within reach;with chair alarm set Nurse Communication: Mobility status PT Visit Diagnosis: Unsteadiness on feet (R26.81);Other abnormalities of gait and mobility (R26.89);Repeated falls (R29.6);History of falling (Z91.81);Difficulty in walking, not elsewhere classified (R26.2)     Time: 3007-6226 PT Time Calculation (min) (ACUTE ONLY): 13 min  Charges:  $Therapeutic Exercise: 8-22 mins                      Talbot Grumbling PT, DPT 11/24/19, 10:38 AM [phone]
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
## 2019-11-24 NOTE — Progress Notes (Signed)
  Calorie Count Note  48 hour calorie count ordered. Day 1 Results  Diet: HH/CM Supplements: n/a  Patient sitting up in bedside chair this morning, reports that he has been eating well during admission, recalls 100% po of breakfast this morning. Meal tickets in envelope collected, however no percentages of individual meal items documented. Will use percentages documented in flowsheets.   8/17 Breakfast:  487.25 kcal and 18 grams of protein (100% per pt) 8/16 Lunch: 505.42 kcal and 27.92 grams of protein (45% per flowsheets) 8/16 Dinner: 564.52 kcal and 30.46 grams of protein (65% per flowsheets)  Day 1 Total intake: ~1081 kcal (54% of minimum estimated needs)  ~51 protein (43% of minimum estimated needs)  Lajuan Lines, RD, LDN Clinical Nutrition After Hours/Weekend Pager # in [place]

## 2019-11-24 NOTE — Progress Notes (Signed)
Discharge instructions given to [facility]. Patient discharged back to facility via facility transportation service and worker in stable condition.

## 2019-11-25 DIAGNOSIS — G40909 Epilepsy, unspecified, not intractable, without status epilepticus: Secondary | ICD-10-CM | POA: Diagnosis not present

## 2019-11-25 DIAGNOSIS — F319 Bipolar disorder, unspecified: Secondary | ICD-10-CM | POA: Diagnosis not present

## 2019-11-25 DIAGNOSIS — F339 Major depressive disorder, recurrent, unspecified: Secondary | ICD-10-CM | POA: Diagnosis not present

## 2019-11-25 DIAGNOSIS — F79 Unspecified intellectual disabilities: Secondary | ICD-10-CM | POA: Diagnosis not present

## 2019-11-25 DIAGNOSIS — R531 Weakness: Secondary | ICD-10-CM | POA: Diagnosis not present

## 2019-11-25 DIAGNOSIS — E119 Type 2 diabetes mellitus without complications: Secondary | ICD-10-CM | POA: Diagnosis not present

## 2019-11-25 DIAGNOSIS — Z8616 Personal history of COVID-19: Secondary | ICD-10-CM | POA: Diagnosis not present

## 2019-11-25 DIAGNOSIS — M150 Primary generalized (osteo)arthritis: Secondary | ICD-10-CM | POA: Diagnosis not present

## 2019-11-25 DIAGNOSIS — I509 Heart failure, unspecified: Secondary | ICD-10-CM | POA: Diagnosis not present

## 2019-11-25 DIAGNOSIS — Z87891 Personal history of nicotine dependence: Secondary | ICD-10-CM | POA: Diagnosis not present

## 2019-11-25 DIAGNOSIS — S069X9S Unspecified intracranial injury with loss of consciousness of unspecified duration, sequela: Secondary | ICD-10-CM | POA: Diagnosis not present

## 2019-11-25 DIAGNOSIS — Z794 Long term (current) use of insulin: Secondary | ICD-10-CM | POA: Diagnosis not present

## 2019-11-25 DIAGNOSIS — N390 Urinary tract infection, site not specified: Secondary | ICD-10-CM | POA: Diagnosis not present

## 2019-11-25 DIAGNOSIS — J9601 Acute respiratory failure with hypoxia: Secondary | ICD-10-CM | POA: Diagnosis not present

## 2019-11-25 DIAGNOSIS — J449 Chronic obstructive pulmonary disease, unspecified: Secondary | ICD-10-CM | POA: Diagnosis not present

## 2019-11-25 DIAGNOSIS — I110 Hypertensive heart disease with heart failure: Secondary | ICD-10-CM | POA: Diagnosis not present

## 2019-11-26 DIAGNOSIS — J9601 Acute respiratory failure with hypoxia: Secondary | ICD-10-CM | POA: Diagnosis not present

## 2019-11-26 DIAGNOSIS — J449 Chronic obstructive pulmonary disease, unspecified: Secondary | ICD-10-CM | POA: Diagnosis not present

## 2019-11-26 DIAGNOSIS — F319 Bipolar disorder, unspecified: Secondary | ICD-10-CM | POA: Diagnosis not present

## 2019-11-26 DIAGNOSIS — I110 Hypertensive heart disease with heart failure: Secondary | ICD-10-CM | POA: Diagnosis not present

## 2019-11-26 DIAGNOSIS — N390 Urinary tract infection, site not specified: Secondary | ICD-10-CM | POA: Diagnosis not present

## 2019-11-26 DIAGNOSIS — I509 Heart failure, unspecified: Secondary | ICD-10-CM | POA: Diagnosis not present

## 2019-11-26 DIAGNOSIS — J9621 Acute and chronic respiratory failure with hypoxia: Secondary | ICD-10-CM | POA: Diagnosis not present

## 2019-11-27 LAB — CULTURE, BLOOD (ROUTINE X 2)
Culture: NO GROWTH
Culture: NO GROWTH
Special Requests: ADEQUATE
Special Requests: ADEQUATE

## 2019-11-30 DIAGNOSIS — J449 Chronic obstructive pulmonary disease, unspecified: Secondary | ICD-10-CM | POA: Diagnosis not present

## 2019-11-30 DIAGNOSIS — E1165 Type 2 diabetes mellitus with hyperglycemia: Secondary | ICD-10-CM | POA: Diagnosis not present

## 2019-11-30 DIAGNOSIS — F319 Bipolar disorder, unspecified: Secondary | ICD-10-CM | POA: Diagnosis not present

## 2019-11-30 DIAGNOSIS — J9601 Acute respiratory failure with hypoxia: Secondary | ICD-10-CM | POA: Diagnosis not present

## 2019-11-30 DIAGNOSIS — I509 Heart failure, unspecified: Secondary | ICD-10-CM | POA: Diagnosis not present

## 2019-11-30 DIAGNOSIS — Z125 Encounter for screening for malignant neoplasm of prostate: Secondary | ICD-10-CM | POA: Diagnosis not present

## 2019-11-30 DIAGNOSIS — N401 Enlarged prostate with lower urinary tract symptoms: Secondary | ICD-10-CM | POA: Diagnosis not present

## 2019-11-30 DIAGNOSIS — N390 Urinary tract infection, site not specified: Secondary | ICD-10-CM | POA: Diagnosis not present

## 2019-11-30 DIAGNOSIS — I110 Hypertensive heart disease with heart failure: Secondary | ICD-10-CM | POA: Diagnosis not present

## 2019-11-30 DIAGNOSIS — R32 Unspecified urinary incontinence: Secondary | ICD-10-CM | POA: Diagnosis not present

## 2019-12-02 DIAGNOSIS — I509 Heart failure, unspecified: Secondary | ICD-10-CM | POA: Diagnosis not present

## 2019-12-02 DIAGNOSIS — Z20828 Contact with and (suspected) exposure to other viral communicable diseases: Secondary | ICD-10-CM | POA: Diagnosis not present

## 2019-12-02 DIAGNOSIS — F319 Bipolar disorder, unspecified: Secondary | ICD-10-CM | POA: Diagnosis not present

## 2019-12-02 DIAGNOSIS — I110 Hypertensive heart disease with heart failure: Secondary | ICD-10-CM | POA: Diagnosis not present

## 2019-12-02 DIAGNOSIS — U071 COVID-19: Secondary | ICD-10-CM | POA: Diagnosis not present

## 2019-12-02 DIAGNOSIS — J449 Chronic obstructive pulmonary disease, unspecified: Secondary | ICD-10-CM | POA: Diagnosis not present

## 2019-12-02 DIAGNOSIS — J9601 Acute respiratory failure with hypoxia: Secondary | ICD-10-CM | POA: Diagnosis not present

## 2019-12-02 DIAGNOSIS — N390 Urinary tract infection, site not specified: Secondary | ICD-10-CM | POA: Diagnosis not present

## 2019-12-03 DIAGNOSIS — U071 COVID-19: Secondary | ICD-10-CM | POA: Diagnosis not present

## 2019-12-03 DIAGNOSIS — Z20828 Contact with and (suspected) exposure to other viral communicable diseases: Secondary | ICD-10-CM | POA: Diagnosis not present

## 2019-12-07 ENCOUNTER — Other Ambulatory Visit: Payer: Self-pay | Admitting: "Endocrinology

## 2019-12-07 DIAGNOSIS — I509 Heart failure, unspecified: Secondary | ICD-10-CM | POA: Diagnosis not present

## 2019-12-07 DIAGNOSIS — F319 Bipolar disorder, unspecified: Secondary | ICD-10-CM | POA: Diagnosis not present

## 2019-12-07 DIAGNOSIS — I110 Hypertensive heart disease with heart failure: Secondary | ICD-10-CM | POA: Diagnosis not present

## 2019-12-07 DIAGNOSIS — N390 Urinary tract infection, site not specified: Secondary | ICD-10-CM | POA: Diagnosis not present

## 2019-12-07 DIAGNOSIS — J9601 Acute respiratory failure with hypoxia: Secondary | ICD-10-CM | POA: Diagnosis not present

## 2019-12-07 DIAGNOSIS — J449 Chronic obstructive pulmonary disease, unspecified: Secondary | ICD-10-CM | POA: Diagnosis not present

## 2019-12-08 DIAGNOSIS — F310 Bipolar disorder, current episode hypomanic: Secondary | ICD-10-CM | POA: Diagnosis not present

## 2019-12-08 DIAGNOSIS — Z20828 Contact with and (suspected) exposure to other viral communicable diseases: Secondary | ICD-10-CM | POA: Diagnosis not present

## 2019-12-08 DIAGNOSIS — U071 COVID-19: Secondary | ICD-10-CM | POA: Diagnosis not present

## 2019-12-09 DIAGNOSIS — I110 Hypertensive heart disease with heart failure: Secondary | ICD-10-CM | POA: Diagnosis not present

## 2019-12-09 DIAGNOSIS — J449 Chronic obstructive pulmonary disease, unspecified: Secondary | ICD-10-CM | POA: Diagnosis not present

## 2019-12-09 DIAGNOSIS — N390 Urinary tract infection, site not specified: Secondary | ICD-10-CM | POA: Diagnosis not present

## 2019-12-09 DIAGNOSIS — I509 Heart failure, unspecified: Secondary | ICD-10-CM | POA: Diagnosis not present

## 2019-12-09 DIAGNOSIS — J9601 Acute respiratory failure with hypoxia: Secondary | ICD-10-CM | POA: Diagnosis not present

## 2019-12-09 DIAGNOSIS — F319 Bipolar disorder, unspecified: Secondary | ICD-10-CM | POA: Diagnosis not present

## 2019-12-10 DIAGNOSIS — U071 COVID-19: Secondary | ICD-10-CM | POA: Diagnosis not present

## 2019-12-10 DIAGNOSIS — Z20828 Contact with and (suspected) exposure to other viral communicable diseases: Secondary | ICD-10-CM | POA: Diagnosis not present

## 2019-12-15 DIAGNOSIS — U071 COVID-19: Secondary | ICD-10-CM | POA: Diagnosis not present

## 2019-12-15 DIAGNOSIS — I509 Heart failure, unspecified: Secondary | ICD-10-CM | POA: Diagnosis not present

## 2019-12-15 DIAGNOSIS — Z20828 Contact with and (suspected) exposure to other viral communicable diseases: Secondary | ICD-10-CM | POA: Diagnosis not present

## 2019-12-15 DIAGNOSIS — J9601 Acute respiratory failure with hypoxia: Secondary | ICD-10-CM | POA: Diagnosis not present

## 2019-12-15 DIAGNOSIS — I110 Hypertensive heart disease with heart failure: Secondary | ICD-10-CM | POA: Diagnosis not present

## 2019-12-15 DIAGNOSIS — J449 Chronic obstructive pulmonary disease, unspecified: Secondary | ICD-10-CM | POA: Diagnosis not present

## 2019-12-15 DIAGNOSIS — F319 Bipolar disorder, unspecified: Secondary | ICD-10-CM | POA: Diagnosis not present

## 2019-12-15 DIAGNOSIS — N390 Urinary tract infection, site not specified: Secondary | ICD-10-CM | POA: Diagnosis not present

## 2019-12-16 DIAGNOSIS — F310 Bipolar disorder, current episode hypomanic: Secondary | ICD-10-CM | POA: Diagnosis not present

## 2019-12-16 DIAGNOSIS — Z20828 Contact with and (suspected) exposure to other viral communicable diseases: Secondary | ICD-10-CM | POA: Diagnosis not present

## 2019-12-16 DIAGNOSIS — U071 COVID-19: Secondary | ICD-10-CM | POA: Diagnosis not present

## 2019-12-17 DIAGNOSIS — I110 Hypertensive heart disease with heart failure: Secondary | ICD-10-CM | POA: Diagnosis not present

## 2019-12-17 DIAGNOSIS — F319 Bipolar disorder, unspecified: Secondary | ICD-10-CM | POA: Diagnosis not present

## 2019-12-17 DIAGNOSIS — E1140 Type 2 diabetes mellitus with diabetic neuropathy, unspecified: Secondary | ICD-10-CM | POA: Diagnosis not present

## 2019-12-17 DIAGNOSIS — L110 Acquired keratosis follicularis: Secondary | ICD-10-CM | POA: Diagnosis not present

## 2019-12-17 DIAGNOSIS — U071 COVID-19: Secondary | ICD-10-CM | POA: Diagnosis not present

## 2019-12-17 DIAGNOSIS — J9601 Acute respiratory failure with hypoxia: Secondary | ICD-10-CM | POA: Diagnosis not present

## 2019-12-17 DIAGNOSIS — N390 Urinary tract infection, site not specified: Secondary | ICD-10-CM | POA: Diagnosis not present

## 2019-12-17 DIAGNOSIS — J449 Chronic obstructive pulmonary disease, unspecified: Secondary | ICD-10-CM | POA: Diagnosis not present

## 2019-12-17 DIAGNOSIS — Z20828 Contact with and (suspected) exposure to other viral communicable diseases: Secondary | ICD-10-CM | POA: Diagnosis not present

## 2019-12-17 DIAGNOSIS — I509 Heart failure, unspecified: Secondary | ICD-10-CM | POA: Diagnosis not present

## 2019-12-17 DIAGNOSIS — E1142 Type 2 diabetes mellitus with diabetic polyneuropathy: Secondary | ICD-10-CM | POA: Diagnosis not present

## 2019-12-17 DIAGNOSIS — L609 Nail disorder, unspecified: Secondary | ICD-10-CM | POA: Diagnosis not present

## 2019-12-20 DIAGNOSIS — F319 Bipolar disorder, unspecified: Secondary | ICD-10-CM | POA: Diagnosis not present

## 2019-12-20 DIAGNOSIS — J449 Chronic obstructive pulmonary disease, unspecified: Secondary | ICD-10-CM | POA: Diagnosis not present

## 2019-12-20 DIAGNOSIS — I509 Heart failure, unspecified: Secondary | ICD-10-CM | POA: Diagnosis not present

## 2019-12-20 DIAGNOSIS — J9601 Acute respiratory failure with hypoxia: Secondary | ICD-10-CM | POA: Diagnosis not present

## 2019-12-20 DIAGNOSIS — I110 Hypertensive heart disease with heart failure: Secondary | ICD-10-CM | POA: Diagnosis not present

## 2019-12-20 DIAGNOSIS — N390 Urinary tract infection, site not specified: Secondary | ICD-10-CM | POA: Diagnosis not present

## 2019-12-21 DIAGNOSIS — Z20828 Contact with and (suspected) exposure to other viral communicable diseases: Secondary | ICD-10-CM | POA: Diagnosis not present

## 2019-12-21 DIAGNOSIS — U071 COVID-19: Secondary | ICD-10-CM | POA: Diagnosis not present

## 2019-12-24 DIAGNOSIS — I509 Heart failure, unspecified: Secondary | ICD-10-CM | POA: Diagnosis not present

## 2019-12-24 DIAGNOSIS — Z20828 Contact with and (suspected) exposure to other viral communicable diseases: Secondary | ICD-10-CM | POA: Diagnosis not present

## 2019-12-24 DIAGNOSIS — U071 COVID-19: Secondary | ICD-10-CM | POA: Diagnosis not present

## 2019-12-24 DIAGNOSIS — J9601 Acute respiratory failure with hypoxia: Secondary | ICD-10-CM | POA: Diagnosis not present

## 2019-12-24 DIAGNOSIS — I110 Hypertensive heart disease with heart failure: Secondary | ICD-10-CM | POA: Diagnosis not present

## 2019-12-24 DIAGNOSIS — J449 Chronic obstructive pulmonary disease, unspecified: Secondary | ICD-10-CM | POA: Diagnosis not present

## 2019-12-24 DIAGNOSIS — N390 Urinary tract infection, site not specified: Secondary | ICD-10-CM | POA: Diagnosis not present

## 2019-12-24 DIAGNOSIS — F319 Bipolar disorder, unspecified: Secondary | ICD-10-CM | POA: Diagnosis not present

## 2019-12-25 DIAGNOSIS — F79 Unspecified intellectual disabilities: Secondary | ICD-10-CM | POA: Diagnosis not present

## 2019-12-25 DIAGNOSIS — Z87891 Personal history of nicotine dependence: Secondary | ICD-10-CM | POA: Diagnosis not present

## 2019-12-25 DIAGNOSIS — J449 Chronic obstructive pulmonary disease, unspecified: Secondary | ICD-10-CM | POA: Diagnosis not present

## 2019-12-25 DIAGNOSIS — Z794 Long term (current) use of insulin: Secondary | ICD-10-CM | POA: Diagnosis not present

## 2019-12-25 DIAGNOSIS — G40909 Epilepsy, unspecified, not intractable, without status epilepticus: Secondary | ICD-10-CM | POA: Diagnosis not present

## 2019-12-25 DIAGNOSIS — N390 Urinary tract infection, site not specified: Secondary | ICD-10-CM | POA: Diagnosis not present

## 2019-12-25 DIAGNOSIS — F339 Major depressive disorder, recurrent, unspecified: Secondary | ICD-10-CM | POA: Diagnosis not present

## 2019-12-25 DIAGNOSIS — I509 Heart failure, unspecified: Secondary | ICD-10-CM | POA: Diagnosis not present

## 2019-12-25 DIAGNOSIS — J9601 Acute respiratory failure with hypoxia: Secondary | ICD-10-CM | POA: Diagnosis not present

## 2019-12-25 DIAGNOSIS — E119 Type 2 diabetes mellitus without complications: Secondary | ICD-10-CM | POA: Diagnosis not present

## 2019-12-25 DIAGNOSIS — S069X9S Unspecified intracranial injury with loss of consciousness of unspecified duration, sequela: Secondary | ICD-10-CM | POA: Diagnosis not present

## 2019-12-25 DIAGNOSIS — M150 Primary generalized (osteo)arthritis: Secondary | ICD-10-CM | POA: Diagnosis not present

## 2019-12-25 DIAGNOSIS — Z8616 Personal history of COVID-19: Secondary | ICD-10-CM | POA: Diagnosis not present

## 2019-12-25 DIAGNOSIS — F319 Bipolar disorder, unspecified: Secondary | ICD-10-CM | POA: Diagnosis not present

## 2019-12-25 DIAGNOSIS — R531 Weakness: Secondary | ICD-10-CM | POA: Diagnosis not present

## 2019-12-25 DIAGNOSIS — I110 Hypertensive heart disease with heart failure: Secondary | ICD-10-CM | POA: Diagnosis not present

## 2019-12-28 DIAGNOSIS — I509 Heart failure, unspecified: Secondary | ICD-10-CM | POA: Diagnosis not present

## 2019-12-28 DIAGNOSIS — I110 Hypertensive heart disease with heart failure: Secondary | ICD-10-CM | POA: Diagnosis not present

## 2019-12-28 DIAGNOSIS — J9601 Acute respiratory failure with hypoxia: Secondary | ICD-10-CM | POA: Diagnosis not present

## 2019-12-28 DIAGNOSIS — F319 Bipolar disorder, unspecified: Secondary | ICD-10-CM | POA: Diagnosis not present

## 2019-12-28 DIAGNOSIS — N390 Urinary tract infection, site not specified: Secondary | ICD-10-CM | POA: Diagnosis not present

## 2019-12-28 DIAGNOSIS — J449 Chronic obstructive pulmonary disease, unspecified: Secondary | ICD-10-CM | POA: Diagnosis not present

## 2019-12-29 DIAGNOSIS — U071 COVID-19: Secondary | ICD-10-CM | POA: Diagnosis not present

## 2019-12-29 DIAGNOSIS — Z20828 Contact with and (suspected) exposure to other viral communicable diseases: Secondary | ICD-10-CM | POA: Diagnosis not present

## 2019-12-30 DIAGNOSIS — J9601 Acute respiratory failure with hypoxia: Secondary | ICD-10-CM | POA: Diagnosis not present

## 2019-12-30 DIAGNOSIS — I110 Hypertensive heart disease with heart failure: Secondary | ICD-10-CM | POA: Diagnosis not present

## 2019-12-30 DIAGNOSIS — J449 Chronic obstructive pulmonary disease, unspecified: Secondary | ICD-10-CM | POA: Diagnosis not present

## 2019-12-30 DIAGNOSIS — F319 Bipolar disorder, unspecified: Secondary | ICD-10-CM | POA: Diagnosis not present

## 2019-12-30 DIAGNOSIS — I509 Heart failure, unspecified: Secondary | ICD-10-CM | POA: Diagnosis not present

## 2019-12-30 DIAGNOSIS — N390 Urinary tract infection, site not specified: Secondary | ICD-10-CM | POA: Diagnosis not present

## 2019-12-31 DIAGNOSIS — U071 COVID-19: Secondary | ICD-10-CM | POA: Diagnosis not present

## 2019-12-31 DIAGNOSIS — E1165 Type 2 diabetes mellitus with hyperglycemia: Secondary | ICD-10-CM | POA: Diagnosis not present

## 2019-12-31 DIAGNOSIS — Z20828 Contact with and (suspected) exposure to other viral communicable diseases: Secondary | ICD-10-CM | POA: Diagnosis not present

## 2019-12-31 DIAGNOSIS — J449 Chronic obstructive pulmonary disease, unspecified: Secondary | ICD-10-CM | POA: Diagnosis not present

## 2020-01-04 DIAGNOSIS — I509 Heart failure, unspecified: Secondary | ICD-10-CM | POA: Diagnosis not present

## 2020-01-04 DIAGNOSIS — J9601 Acute respiratory failure with hypoxia: Secondary | ICD-10-CM | POA: Diagnosis not present

## 2020-01-04 DIAGNOSIS — J449 Chronic obstructive pulmonary disease, unspecified: Secondary | ICD-10-CM | POA: Diagnosis not present

## 2020-01-04 DIAGNOSIS — N390 Urinary tract infection, site not specified: Secondary | ICD-10-CM | POA: Diagnosis not present

## 2020-01-04 DIAGNOSIS — I110 Hypertensive heart disease with heart failure: Secondary | ICD-10-CM | POA: Diagnosis not present

## 2020-01-04 DIAGNOSIS — F319 Bipolar disorder, unspecified: Secondary | ICD-10-CM | POA: Diagnosis not present

## 2020-01-05 DIAGNOSIS — Z20828 Contact with and (suspected) exposure to other viral communicable diseases: Secondary | ICD-10-CM | POA: Diagnosis not present

## 2020-01-05 DIAGNOSIS — U071 COVID-19: Secondary | ICD-10-CM | POA: Diagnosis not present

## 2020-01-06 DIAGNOSIS — I509 Heart failure, unspecified: Secondary | ICD-10-CM | POA: Diagnosis not present

## 2020-01-06 DIAGNOSIS — F319 Bipolar disorder, unspecified: Secondary | ICD-10-CM | POA: Diagnosis not present

## 2020-01-06 DIAGNOSIS — I110 Hypertensive heart disease with heart failure: Secondary | ICD-10-CM | POA: Diagnosis not present

## 2020-01-06 DIAGNOSIS — J449 Chronic obstructive pulmonary disease, unspecified: Secondary | ICD-10-CM | POA: Diagnosis not present

## 2020-01-06 DIAGNOSIS — J9601 Acute respiratory failure with hypoxia: Secondary | ICD-10-CM | POA: Diagnosis not present

## 2020-01-06 DIAGNOSIS — N390 Urinary tract infection, site not specified: Secondary | ICD-10-CM | POA: Diagnosis not present

## 2020-01-07 DIAGNOSIS — U071 COVID-19: Secondary | ICD-10-CM | POA: Diagnosis not present

## 2020-01-07 DIAGNOSIS — Z20828 Contact with and (suspected) exposure to other viral communicable diseases: Secondary | ICD-10-CM | POA: Diagnosis not present

## 2020-01-08 DIAGNOSIS — Z23 Encounter for immunization: Secondary | ICD-10-CM | POA: Diagnosis not present

## 2020-01-12 DIAGNOSIS — Z20828 Contact with and (suspected) exposure to other viral communicable diseases: Secondary | ICD-10-CM | POA: Diagnosis not present

## 2020-01-12 DIAGNOSIS — U071 COVID-19: Secondary | ICD-10-CM | POA: Diagnosis not present

## 2020-01-13 DIAGNOSIS — E1165 Type 2 diabetes mellitus with hyperglycemia: Secondary | ICD-10-CM | POA: Diagnosis not present

## 2020-01-14 ENCOUNTER — Ambulatory Visit (INDEPENDENT_AMBULATORY_CARE_PROVIDER_SITE_OTHER): Payer: Medicare Other | Admitting: Nurse Practitioner

## 2020-01-14 ENCOUNTER — Other Ambulatory Visit: Payer: Self-pay

## 2020-01-14 ENCOUNTER — Encounter: Payer: Self-pay | Admitting: Nurse Practitioner

## 2020-01-14 VITALS — BP 116/75 | HR 60 | Ht 69.0 in | Wt 210.8 lb

## 2020-01-14 DIAGNOSIS — E782 Mixed hyperlipidemia: Secondary | ICD-10-CM | POA: Diagnosis not present

## 2020-01-14 DIAGNOSIS — E1165 Type 2 diabetes mellitus with hyperglycemia: Secondary | ICD-10-CM | POA: Diagnosis not present

## 2020-01-14 DIAGNOSIS — I10 Essential (primary) hypertension: Secondary | ICD-10-CM

## 2020-01-14 DIAGNOSIS — Z20828 Contact with and (suspected) exposure to other viral communicable diseases: Secondary | ICD-10-CM | POA: Diagnosis not present

## 2020-01-14 DIAGNOSIS — U071 COVID-19: Secondary | ICD-10-CM | POA: Diagnosis not present

## 2020-01-14 LAB — COMPLETE METABOLIC PANEL WITH GFR
AG Ratio: 1.2 (calc) (ref 1.0–2.5)
ALT: 10 U/L (ref 9–46)
AST: 11 U/L (ref 10–35)
Albumin: 3.9 g/dL (ref 3.6–5.1)
Alkaline phosphatase (APISO): 137 U/L (ref 35–144)
BUN/Creatinine Ratio: 22 (calc) (ref 6–22)
BUN: 28 mg/dL — ABNORMAL HIGH (ref 7–25)
CO2: 25 mmol/L (ref 20–32)
Calcium: 9.8 mg/dL (ref 8.6–10.3)
Chloride: 99 mmol/L (ref 98–110)
Creat: 1.28 mg/dL (ref 0.70–1.33)
GFR, Est African American: 71 mL/min/{1.73_m2} (ref >=60)
GFR, Est Non African American: 61 mL/min/{1.73_m2} (ref >=60)
Globulin: 3.2 g/dL (calc) (ref 1.9–3.7)
Glucose, Bld: 168 mg/dL — ABNORMAL HIGH (ref 65–99)
Potassium: 4.7 mmol/L (ref 3.5–5.3)
Sodium: 135 mmol/L (ref 135–146)
Total Bilirubin: 0.3 mg/dL (ref 0.2–1.2)
Total Protein: 7.1 g/dL (ref 6.1–8.1)

## 2020-01-14 LAB — LIPID PANEL
Cholesterol: 206 mg/dL — ABNORMAL HIGH (ref <200)
HDL: 37 mg/dL — ABNORMAL LOW (ref >=40)
LDL Cholesterol (Calc): 131 mg/dL (calc) — ABNORMAL HIGH
Non-HDL Cholesterol (Calc): 169 mg/dL (calc) — ABNORMAL HIGH (ref <130)
Total CHOL/HDL Ratio: 5.6 (calc) — ABNORMAL HIGH (ref <5.0)
Triglycerides: 249 mg/dL — ABNORMAL HIGH (ref <150)
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
## 2020-01-14 NOTE — Patient Instructions (Signed)

## 2020-01-15 ENCOUNTER — Ambulatory Visit: Payer: Medicare Other | Admitting: Urology

## 2020-01-19 DIAGNOSIS — Z20828 Contact with and (suspected) exposure to other viral communicable diseases: Secondary | ICD-10-CM | POA: Diagnosis not present

## 2020-01-19 DIAGNOSIS — U071 COVID-19: Secondary | ICD-10-CM | POA: Diagnosis not present

## 2020-01-21 DIAGNOSIS — Z20828 Contact with and (suspected) exposure to other viral communicable diseases: Secondary | ICD-10-CM | POA: Diagnosis not present

## 2020-01-21 DIAGNOSIS — U071 COVID-19: Secondary | ICD-10-CM | POA: Diagnosis not present

## 2020-01-26 DIAGNOSIS — Z20828 Contact with and (suspected) exposure to other viral communicable diseases: Secondary | ICD-10-CM | POA: Diagnosis not present

## 2020-01-26 DIAGNOSIS — U071 COVID-19: Secondary | ICD-10-CM | POA: Diagnosis not present

## 2020-01-28 DIAGNOSIS — U071 COVID-19: Secondary | ICD-10-CM | POA: Diagnosis not present

## 2020-01-28 DIAGNOSIS — Z20828 Contact with and (suspected) exposure to other viral communicable diseases: Secondary | ICD-10-CM | POA: Diagnosis not present

## 2020-01-30 DIAGNOSIS — J449 Chronic obstructive pulmonary disease, unspecified: Secondary | ICD-10-CM | POA: Diagnosis not present

## 2020-01-30 DIAGNOSIS — E1165 Type 2 diabetes mellitus with hyperglycemia: Secondary | ICD-10-CM | POA: Diagnosis not present

## 2020-02-02 DIAGNOSIS — U071 COVID-19: Secondary | ICD-10-CM | POA: Diagnosis not present

## 2020-02-02 DIAGNOSIS — Z20828 Contact with and (suspected) exposure to other viral communicable diseases: Secondary | ICD-10-CM | POA: Diagnosis not present

## 2020-02-04 DIAGNOSIS — Z20828 Contact with and (suspected) exposure to other viral communicable diseases: Secondary | ICD-10-CM | POA: Diagnosis not present

## 2020-02-04 DIAGNOSIS — U071 COVID-19: Secondary | ICD-10-CM | POA: Diagnosis not present

## 2020-02-09 DIAGNOSIS — Z20828 Contact with and (suspected) exposure to other viral communicable diseases: Secondary | ICD-10-CM | POA: Diagnosis not present

## 2020-02-09 DIAGNOSIS — U071 COVID-19: Secondary | ICD-10-CM | POA: Diagnosis not present

## 2020-02-11 DIAGNOSIS — Z20828 Contact with and (suspected) exposure to other viral communicable diseases: Secondary | ICD-10-CM | POA: Diagnosis not present

## 2020-02-11 DIAGNOSIS — U071 COVID-19: Secondary | ICD-10-CM | POA: Diagnosis not present

## 2020-02-16 DIAGNOSIS — U071 COVID-19: Secondary | ICD-10-CM | POA: Diagnosis not present

## 2020-02-16 DIAGNOSIS — Z20828 Contact with and (suspected) exposure to other viral communicable diseases: Secondary | ICD-10-CM | POA: Diagnosis not present

## 2020-02-18 DIAGNOSIS — U071 COVID-19: Secondary | ICD-10-CM | POA: Diagnosis not present

## 2020-02-18 DIAGNOSIS — Z20828 Contact with and (suspected) exposure to other viral communicable diseases: Secondary | ICD-10-CM | POA: Diagnosis not present

## 2020-02-23 DIAGNOSIS — U071 COVID-19: Secondary | ICD-10-CM | POA: Diagnosis not present

## 2020-02-23 DIAGNOSIS — Z20828 Contact with and (suspected) exposure to other viral communicable diseases: Secondary | ICD-10-CM | POA: Diagnosis not present

## 2020-02-25 DIAGNOSIS — Z20828 Contact with and (suspected) exposure to other viral communicable diseases: Secondary | ICD-10-CM | POA: Diagnosis not present

## 2020-02-25 DIAGNOSIS — U071 COVID-19: Secondary | ICD-10-CM | POA: Diagnosis not present

## 2020-02-25 DIAGNOSIS — E1142 Type 2 diabetes mellitus with diabetic polyneuropathy: Secondary | ICD-10-CM | POA: Diagnosis not present

## 2020-02-25 DIAGNOSIS — L609 Nail disorder, unspecified: Secondary | ICD-10-CM | POA: Diagnosis not present

## 2020-02-25 DIAGNOSIS — E1140 Type 2 diabetes mellitus with diabetic neuropathy, unspecified: Secondary | ICD-10-CM | POA: Diagnosis not present

## 2020-02-25 DIAGNOSIS — L110 Acquired keratosis follicularis: Secondary | ICD-10-CM | POA: Diagnosis not present

## 2020-02-29 ENCOUNTER — Other Ambulatory Visit: Payer: Self-pay

## 2020-02-29 ENCOUNTER — Encounter: Payer: Self-pay | Admitting: Urology

## 2020-02-29 ENCOUNTER — Ambulatory Visit (INDEPENDENT_AMBULATORY_CARE_PROVIDER_SITE_OTHER): Payer: Medicare Other | Admitting: Urology

## 2020-02-29 DIAGNOSIS — N390 Urinary tract infection, site not specified: Secondary | ICD-10-CM

## 2020-02-29 LAB — URINALYSIS, ROUTINE W REFLEX MICROSCOPIC
Bilirubin, UA: NEGATIVE
Glucose, UA: NEGATIVE
Ketones, UA: NEGATIVE
Leukocytes,UA: NEGATIVE
Nitrite, UA: NEGATIVE
RBC, UA: NEGATIVE
Specific Gravity, UA: 1.015 (ref 1.005–1.030)
Urobilinogen, Ur: 0.2 mg/dL (ref 0.2–1.0)
pH, UA: 6 (ref 5.0–7.5)

## 2020-02-29 LAB — BLADDER SCAN AMB NON-IMAGING: Scan Result: 80

## 2020-02-29 MED ORDER — MIRABEGRON ER 25 MG PO TB24: 25.0000 mg | ORAL_TABLET | Freq: Every day | ORAL | 11 refills | Status: DC

## 2020-02-29 NOTE — Patient Instructions (Signed)
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
## 2020-02-29 NOTE — Progress Notes (Signed)
Urological Symptom Review  Patient is experiencing the following symptoms: Frequent urination Get up at night to urinate   Review of Systems  Gastrointestinal (upper)  : Negative for upper GI symptoms  Gastrointestinal (lower) : Negative for lower GI symptoms  Constitutional : Negative for symptoms  Skin: Negative for skin symptoms  Eyes: Negative for eye symptoms  Ear/Nose/Throat : Negative for Ear/Nose/Throat symptoms  Hematologic/Lymphatic: Negative for Hematologic/Lymphatic symptoms  Cardiovascular : Leg swelling  Respiratory : Negative for respiratory symptoms  Endocrine: Negative for endocrine symptoms  Musculoskeletal: Negative for musculoskeletal symptoms  Neurological: Negative for neurological symptoms  Psychologic: Depression

## 2020-03-01 DIAGNOSIS — Z20828 Contact with and (suspected) exposure to other viral communicable diseases: Secondary | ICD-10-CM | POA: Diagnosis not present

## 2020-03-01 DIAGNOSIS — E1165 Type 2 diabetes mellitus with hyperglycemia: Secondary | ICD-10-CM | POA: Diagnosis not present

## 2020-03-01 DIAGNOSIS — J449 Chronic obstructive pulmonary disease, unspecified: Secondary | ICD-10-CM | POA: Diagnosis not present

## 2020-03-01 DIAGNOSIS — U071 COVID-19: Secondary | ICD-10-CM | POA: Diagnosis not present

## 2020-03-08 DIAGNOSIS — Z20828 Contact with and (suspected) exposure to other viral communicable diseases: Secondary | ICD-10-CM | POA: Diagnosis not present

## 2020-03-08 DIAGNOSIS — U071 COVID-19: Secondary | ICD-10-CM | POA: Diagnosis not present

## 2020-03-10 DIAGNOSIS — U071 COVID-19: Secondary | ICD-10-CM | POA: Diagnosis not present

## 2020-03-10 DIAGNOSIS — Z20828 Contact with and (suspected) exposure to other viral communicable diseases: Secondary | ICD-10-CM | POA: Diagnosis not present

## 2020-03-15 DIAGNOSIS — Z20828 Contact with and (suspected) exposure to other viral communicable diseases: Secondary | ICD-10-CM | POA: Diagnosis not present

## 2020-03-15 DIAGNOSIS — U071 COVID-19: Secondary | ICD-10-CM | POA: Diagnosis not present

## 2020-03-17 DIAGNOSIS — Z20828 Contact with and (suspected) exposure to other viral communicable diseases: Secondary | ICD-10-CM | POA: Diagnosis not present

## 2020-03-17 DIAGNOSIS — U071 COVID-19: Secondary | ICD-10-CM | POA: Diagnosis not present

## 2020-03-22 DIAGNOSIS — F17210 Nicotine dependence, cigarettes, uncomplicated: Secondary | ICD-10-CM | POA: Diagnosis not present

## 2020-03-22 DIAGNOSIS — F3163 Bipolar disorder, current episode mixed, severe, without psychotic features: Secondary | ICD-10-CM | POA: Diagnosis not present

## 2020-03-22 DIAGNOSIS — J449 Chronic obstructive pulmonary disease, unspecified: Secondary | ICD-10-CM | POA: Diagnosis not present

## 2020-03-22 DIAGNOSIS — F17200 Nicotine dependence, unspecified, uncomplicated: Secondary | ICD-10-CM | POA: Diagnosis not present

## 2020-03-22 DIAGNOSIS — E1165 Type 2 diabetes mellitus with hyperglycemia: Secondary | ICD-10-CM | POA: Diagnosis not present

## 2020-03-24 DIAGNOSIS — U071 COVID-19: Secondary | ICD-10-CM | POA: Diagnosis not present

## 2020-03-24 DIAGNOSIS — Z20828 Contact with and (suspected) exposure to other viral communicable diseases: Secondary | ICD-10-CM | POA: Diagnosis not present

## 2020-03-29 DIAGNOSIS — U071 COVID-19: Secondary | ICD-10-CM | POA: Diagnosis not present

## 2020-03-29 DIAGNOSIS — Z20828 Contact with and (suspected) exposure to other viral communicable diseases: Secondary | ICD-10-CM | POA: Diagnosis not present

## 2020-03-31 DIAGNOSIS — U071 COVID-19: Secondary | ICD-10-CM | POA: Diagnosis not present

## 2020-03-31 DIAGNOSIS — Z20828 Contact with and (suspected) exposure to other viral communicable diseases: Secondary | ICD-10-CM | POA: Diagnosis not present

## 2020-04-05 DIAGNOSIS — U071 COVID-19: Secondary | ICD-10-CM | POA: Diagnosis not present

## 2020-04-05 DIAGNOSIS — Z20828 Contact with and (suspected) exposure to other viral communicable diseases: Secondary | ICD-10-CM | POA: Diagnosis not present

## 2020-04-07 ENCOUNTER — Ambulatory Visit: Payer: Medicare Other | Admitting: Urology

## 2020-04-07 DIAGNOSIS — Z20828 Contact with and (suspected) exposure to other viral communicable diseases: Secondary | ICD-10-CM | POA: Diagnosis not present

## 2020-04-07 DIAGNOSIS — U071 COVID-19: Secondary | ICD-10-CM | POA: Diagnosis not present

## 2020-04-12 DIAGNOSIS — Z20828 Contact with and (suspected) exposure to other viral communicable diseases: Secondary | ICD-10-CM | POA: Diagnosis not present

## 2020-04-12 DIAGNOSIS — U071 COVID-19: Secondary | ICD-10-CM | POA: Diagnosis not present

## 2020-04-14 DIAGNOSIS — Z20828 Contact with and (suspected) exposure to other viral communicable diseases: Secondary | ICD-10-CM | POA: Diagnosis not present

## 2020-04-14 DIAGNOSIS — U071 COVID-19: Secondary | ICD-10-CM | POA: Diagnosis not present

## 2020-04-19 DIAGNOSIS — Z20828 Contact with and (suspected) exposure to other viral communicable diseases: Secondary | ICD-10-CM | POA: Diagnosis not present

## 2020-04-19 DIAGNOSIS — U071 COVID-19: Secondary | ICD-10-CM | POA: Diagnosis not present

## 2020-04-21 ENCOUNTER — Other Ambulatory Visit: Payer: Self-pay

## 2020-04-21 ENCOUNTER — Ambulatory Visit (INDEPENDENT_AMBULATORY_CARE_PROVIDER_SITE_OTHER): Payer: Medicare Other | Admitting: Urology

## 2020-04-21 ENCOUNTER — Encounter: Payer: Self-pay | Admitting: Urology

## 2020-04-21 VITALS — BP 119/74 | HR 81

## 2020-04-21 DIAGNOSIS — R339 Retention of urine, unspecified: Secondary | ICD-10-CM | POA: Diagnosis not present

## 2020-04-21 DIAGNOSIS — N390 Urinary tract infection, site not specified: Secondary | ICD-10-CM | POA: Diagnosis not present

## 2020-04-21 DIAGNOSIS — Z20828 Contact with and (suspected) exposure to other viral communicable diseases: Secondary | ICD-10-CM | POA: Diagnosis not present

## 2020-04-21 DIAGNOSIS — U071 COVID-19: Secondary | ICD-10-CM | POA: Diagnosis not present

## 2020-04-21 LAB — MICROSCOPIC EXAMINATION
Epithelial Cells (non renal): NONE SEEN /hpf (ref 0–10)
RBC, Urine: NONE SEEN /hpf (ref 0–2)
Renal Epithel, UA: NONE SEEN /hpf
WBC, UA: NONE SEEN /hpf (ref 0–5)

## 2020-04-21 LAB — URINALYSIS, ROUTINE W REFLEX MICROSCOPIC
Bilirubin, UA: NEGATIVE
Glucose, UA: NEGATIVE
Ketones, UA: NEGATIVE
Leukocytes,UA: NEGATIVE
Nitrite, UA: NEGATIVE
RBC, UA: NEGATIVE
Specific Gravity, UA: 1.015 (ref 1.005–1.030)
Urobilinogen, Ur: 0.2 mg/dL (ref 0.2–1.0)
pH, UA: 6 (ref 5.0–7.5)

## 2020-04-21 LAB — BLADDER SCAN AMB NON-IMAGING: Scan Result: 387

## 2020-04-21 MED ORDER — TAMSULOSIN HCL 0.4 MG PO CAPS
0.4000 mg | ORAL_CAPSULE | Freq: Two times a day (BID) | ORAL | 11 refills | Status: DC
Start: 2020-04-21 — End: 2020-04-21

## 2020-04-21 MED ORDER — TAMSULOSIN HCL 0.4 MG PO CAPS
0.4000 mg | ORAL_CAPSULE | Freq: Two times a day (BID) | ORAL | 11 refills | Status: DC
Start: 2020-04-21 — End: 2022-12-05

## 2020-04-21 NOTE — Progress Notes (Signed)
Urological Symptom Review  Patient is experiencing the following symptoms: Frequent urination Get up at night to urinate   Review of Systems  Gastrointestinal (upper)  : Indigestion/heartburn  Gastrointestinal (lower) : Negative for lower GI symptoms  Constitutional : Negative for symptoms  Skin: Negative for skin symptoms  Eyes: Negative for eye symptoms  Ear/Nose/Throat : Sinus problems  Hematologic/Lymphatic: Negative for Hematologic/Lymphatic symptoms  Cardiovascular : Leg swelling  Respiratory : Negative for respiratory symptoms  Endocrine: Excessive thirst  Musculoskeletal: Negative for musculoskeletal symptoms  Neurological: Negative for neurological symptoms  Psychologic: Negative for psychiatric symptoms

## 2020-04-21 NOTE — Patient Instructions (Signed)
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
## 2020-04-26 DIAGNOSIS — Z20828 Contact with and (suspected) exposure to other viral communicable diseases: Secondary | ICD-10-CM | POA: Diagnosis not present

## 2020-04-26 DIAGNOSIS — U071 COVID-19: Secondary | ICD-10-CM | POA: Diagnosis not present

## 2020-04-26 DIAGNOSIS — J449 Chronic obstructive pulmonary disease, unspecified: Secondary | ICD-10-CM | POA: Diagnosis not present

## 2020-04-26 DIAGNOSIS — E1165 Type 2 diabetes mellitus with hyperglycemia: Secondary | ICD-10-CM | POA: Diagnosis not present

## 2020-04-28 DIAGNOSIS — U071 COVID-19: Secondary | ICD-10-CM | POA: Diagnosis not present

## 2020-04-28 DIAGNOSIS — Z20828 Contact with and (suspected) exposure to other viral communicable diseases: Secondary | ICD-10-CM | POA: Diagnosis not present

## 2020-04-29 DIAGNOSIS — Z20828 Contact with and (suspected) exposure to other viral communicable diseases: Secondary | ICD-10-CM | POA: Diagnosis not present

## 2020-04-29 DIAGNOSIS — U071 COVID-19: Secondary | ICD-10-CM | POA: Diagnosis not present

## 2020-05-03 DIAGNOSIS — Z20828 Contact with and (suspected) exposure to other viral communicable diseases: Secondary | ICD-10-CM | POA: Diagnosis not present

## 2020-05-03 DIAGNOSIS — U071 COVID-19: Secondary | ICD-10-CM | POA: Diagnosis not present

## 2020-05-04 DIAGNOSIS — H25813 Combined forms of age-related cataract, bilateral: Secondary | ICD-10-CM | POA: Diagnosis not present

## 2020-05-04 DIAGNOSIS — Z7984 Long term (current) use of oral hypoglycemic drugs: Secondary | ICD-10-CM | POA: Diagnosis not present

## 2020-05-04 DIAGNOSIS — E119 Type 2 diabetes mellitus without complications: Secondary | ICD-10-CM | POA: Diagnosis not present

## 2020-05-05 DIAGNOSIS — U071 COVID-19: Secondary | ICD-10-CM | POA: Diagnosis not present

## 2020-05-05 DIAGNOSIS — Z20828 Contact with and (suspected) exposure to other viral communicable diseases: Secondary | ICD-10-CM | POA: Diagnosis not present

## 2020-05-10 DIAGNOSIS — U071 COVID-19: Secondary | ICD-10-CM | POA: Diagnosis not present

## 2020-05-10 DIAGNOSIS — E1165 Type 2 diabetes mellitus with hyperglycemia: Secondary | ICD-10-CM | POA: Diagnosis not present

## 2020-05-10 DIAGNOSIS — Z20828 Contact with and (suspected) exposure to other viral communicable diseases: Secondary | ICD-10-CM | POA: Diagnosis not present

## 2020-05-11 LAB — COMPREHENSIVE METABOLIC PANEL
ALT: 7 IU/L (ref 0–44)
AST: 12 IU/L (ref 0–40)
Albumin/Globulin Ratio: 1.3 (ref 1.2–2.2)
Albumin: 4.1 g/dL (ref 3.8–4.9)
Alkaline Phosphatase: 139 IU/L — ABNORMAL HIGH (ref 44–121)
BUN/Creatinine Ratio: 19 (ref 9–20)
BUN: 22 mg/dL (ref 6–24)
Bilirubin Total: <0.2 mg/dL (ref 0.0–1.2)
CO2: 20 mmol/L (ref 20–29)
Calcium: 9.6 mg/dL (ref 8.7–10.2)
Chloride: 105 mmol/L (ref 96–106)
Creatinine, Ser: 1.18 mg/dL (ref 0.76–1.27)
GFR calc Af Amer: 78 mL/min/{1.73_m2} (ref >59)
GFR calc non Af Amer: 68 mL/min/{1.73_m2} (ref >59)
Globulin, Total: 3.1 g/dL (ref 1.5–4.5)
Glucose: 116 mg/dL — ABNORMAL HIGH (ref 65–99)
Potassium: 5.1 mmol/L (ref 3.5–5.2)
Sodium: 140 mmol/L (ref 134–144)
Total Protein: 7.2 g/dL (ref 6.0–8.5)

## 2020-05-11 LAB — MICROALBUMIN / CREATININE URINE RATIO
Creatinine, Urine: 50.9 mg/dL
Microalb/Creat Ratio: 738 mg/g creat — ABNORMAL HIGH (ref 0–29)
Microalbumin, Urine: 375.8 ug/mL

## 2020-05-11 LAB — LIPID PANEL
Chol/HDL Ratio: 4.8 ratio (ref 0.0–5.0)
Cholesterol, Total: 186 mg/dL (ref 100–199)
HDL: 39 mg/dL — ABNORMAL LOW (ref >39)
LDL Chol Calc (NIH): 113 mg/dL — ABNORMAL HIGH (ref 0–99)
Triglycerides: 193 mg/dL — ABNORMAL HIGH (ref 0–149)
VLDL Cholesterol Cal: 34 mg/dL (ref 5–40)

## 2020-05-11 LAB — HEMOGLOBIN A1C
Est. average glucose Bld gHb Est-mCnc: 183 mg/dL
Hgb A1c MFr Bld: 8 % — ABNORMAL HIGH (ref 4.8–5.6)

## 2020-05-12 DIAGNOSIS — L03031 Cellulitis of right toe: Secondary | ICD-10-CM | POA: Diagnosis not present

## 2020-05-12 DIAGNOSIS — L600 Ingrowing nail: Secondary | ICD-10-CM | POA: Diagnosis not present

## 2020-05-12 DIAGNOSIS — M79674 Pain in right toe(s): Secondary | ICD-10-CM | POA: Diagnosis not present

## 2020-05-12 DIAGNOSIS — Z20828 Contact with and (suspected) exposure to other viral communicable diseases: Secondary | ICD-10-CM | POA: Diagnosis not present

## 2020-05-12 DIAGNOSIS — U071 COVID-19: Secondary | ICD-10-CM | POA: Diagnosis not present

## 2020-05-12 DIAGNOSIS — M79671 Pain in right foot: Secondary | ICD-10-CM | POA: Diagnosis not present

## 2020-05-17 ENCOUNTER — Other Ambulatory Visit: Payer: Self-pay

## 2020-05-17 ENCOUNTER — Ambulatory Visit (INDEPENDENT_AMBULATORY_CARE_PROVIDER_SITE_OTHER): Payer: Medicare Other | Admitting: Nurse Practitioner

## 2020-05-17 ENCOUNTER — Encounter: Payer: Self-pay | Admitting: Nurse Practitioner

## 2020-05-17 VITALS — BP 127/80 | HR 92 | Ht 69.0 in | Wt 220.8 lb

## 2020-05-17 DIAGNOSIS — E1165 Type 2 diabetes mellitus with hyperglycemia: Secondary | ICD-10-CM

## 2020-05-17 DIAGNOSIS — I10 Essential (primary) hypertension: Secondary | ICD-10-CM | POA: Diagnosis not present

## 2020-05-17 DIAGNOSIS — Z20828 Contact with and (suspected) exposure to other viral communicable diseases: Secondary | ICD-10-CM | POA: Diagnosis not present

## 2020-05-17 DIAGNOSIS — E782 Mixed hyperlipidemia: Secondary | ICD-10-CM | POA: Diagnosis not present

## 2020-05-17 DIAGNOSIS — U071 COVID-19: Secondary | ICD-10-CM | POA: Diagnosis not present
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
## 2020-05-17 NOTE — Patient Instructions (Signed)

## 2020-05-19 DIAGNOSIS — U071 COVID-19: Secondary | ICD-10-CM | POA: Diagnosis not present

## 2020-05-19 DIAGNOSIS — Z20828 Contact with and (suspected) exposure to other viral communicable diseases: Secondary | ICD-10-CM | POA: Diagnosis not present

## 2020-05-24 DIAGNOSIS — Z20828 Contact with and (suspected) exposure to other viral communicable diseases: Secondary | ICD-10-CM | POA: Diagnosis not present

## 2020-05-24 DIAGNOSIS — U071 COVID-19: Secondary | ICD-10-CM | POA: Diagnosis not present

## 2020-05-26 DIAGNOSIS — M79671 Pain in right foot: Secondary | ICD-10-CM | POA: Diagnosis not present

## 2020-05-26 DIAGNOSIS — U071 COVID-19: Secondary | ICD-10-CM | POA: Diagnosis not present

## 2020-05-26 DIAGNOSIS — L600 Ingrowing nail: Secondary | ICD-10-CM | POA: Diagnosis not present

## 2020-05-26 DIAGNOSIS — Z20828 Contact with and (suspected) exposure to other viral communicable diseases: Secondary | ICD-10-CM | POA: Diagnosis not present

## 2020-05-26 DIAGNOSIS — M79674 Pain in right toe(s): Secondary | ICD-10-CM | POA: Diagnosis not present

## 2020-05-27 DIAGNOSIS — F17210 Nicotine dependence, cigarettes, uncomplicated: Secondary | ICD-10-CM | POA: Diagnosis not present

## 2020-05-27 DIAGNOSIS — E1165 Type 2 diabetes mellitus with hyperglycemia: Secondary | ICD-10-CM | POA: Diagnosis not present

## 2020-05-31 DIAGNOSIS — Z20828 Contact with and (suspected) exposure to other viral communicable diseases: Secondary | ICD-10-CM | POA: Diagnosis not present

## 2020-05-31 DIAGNOSIS — U071 COVID-19: Secondary | ICD-10-CM | POA: Diagnosis not present

## 2020-06-01 DIAGNOSIS — F310 Bipolar disorder, current episode hypomanic: Secondary | ICD-10-CM | POA: Diagnosis not present

## 2020-06-02 DIAGNOSIS — U071 COVID-19: Secondary | ICD-10-CM | POA: Diagnosis not present

## 2020-06-02 DIAGNOSIS — Z20828 Contact with and (suspected) exposure to other viral communicable diseases: Secondary | ICD-10-CM | POA: Diagnosis not present

## 2020-06-07 DIAGNOSIS — Z20828 Contact with and (suspected) exposure to other viral communicable diseases: Secondary | ICD-10-CM | POA: Diagnosis not present

## 2020-06-07 DIAGNOSIS — U071 COVID-19: Secondary | ICD-10-CM | POA: Diagnosis not present

## 2020-06-09 DIAGNOSIS — Z20828 Contact with and (suspected) exposure to other viral communicable diseases: Secondary | ICD-10-CM | POA: Diagnosis not present

## 2020-06-09 DIAGNOSIS — U071 COVID-19: Secondary | ICD-10-CM | POA: Diagnosis not present

## 2020-06-10 ENCOUNTER — Ambulatory Visit: Payer: Medicare Other | Admitting: Urology

## 2020-06-10 DIAGNOSIS — N390 Urinary tract infection, site not specified: Secondary | ICD-10-CM

## 2020-06-14 DIAGNOSIS — U071 COVID-19: Secondary | ICD-10-CM | POA: Diagnosis not present

## 2020-06-14 DIAGNOSIS — Z20828 Contact with and (suspected) exposure to other viral communicable diseases: Secondary | ICD-10-CM | POA: Diagnosis not present

## 2020-06-21 DIAGNOSIS — Z20828 Contact with and (suspected) exposure to other viral communicable diseases: Secondary | ICD-10-CM | POA: Diagnosis not present

## 2020-06-21 DIAGNOSIS — U071 COVID-19: Secondary | ICD-10-CM | POA: Diagnosis not present

## 2020-06-24 DIAGNOSIS — F17200 Nicotine dependence, unspecified, uncomplicated: Secondary | ICD-10-CM | POA: Diagnosis not present

## 2020-06-24 DIAGNOSIS — E1165 Type 2 diabetes mellitus with hyperglycemia: Secondary | ICD-10-CM | POA: Diagnosis not present

## 2020-06-28 DIAGNOSIS — Z20828 Contact with and (suspected) exposure to other viral communicable diseases: Secondary | ICD-10-CM | POA: Diagnosis not present

## 2020-06-28 DIAGNOSIS — U071 COVID-19: Secondary | ICD-10-CM | POA: Diagnosis not present

## 2020-07-07 DIAGNOSIS — U071 COVID-19: Secondary | ICD-10-CM | POA: Diagnosis not present

## 2020-07-07 DIAGNOSIS — Z20828 Contact with and (suspected) exposure to other viral communicable diseases: Secondary | ICD-10-CM | POA: Diagnosis not present

## 2020-07-12 DIAGNOSIS — Z20828 Contact with and (suspected) exposure to other viral communicable diseases: Secondary | ICD-10-CM | POA: Diagnosis not present

## 2020-07-12 DIAGNOSIS — U071 COVID-19: Secondary | ICD-10-CM | POA: Diagnosis not present

## 2020-07-19 DIAGNOSIS — Z20828 Contact with and (suspected) exposure to other viral communicable diseases: Secondary | ICD-10-CM | POA: Diagnosis not present

## 2020-07-19 DIAGNOSIS — U071 COVID-19: Secondary | ICD-10-CM | POA: Diagnosis not present

## 2020-07-25 DIAGNOSIS — F17200 Nicotine dependence, unspecified, uncomplicated: Secondary | ICD-10-CM | POA: Diagnosis not present

## 2020-07-25 DIAGNOSIS — E1165 Type 2 diabetes mellitus with hyperglycemia: Secondary | ICD-10-CM | POA: Diagnosis not present

## 2020-07-26 DIAGNOSIS — Z20828 Contact with and (suspected) exposure to other viral communicable diseases: Secondary | ICD-10-CM | POA: Diagnosis not present

## 2020-07-26 DIAGNOSIS — U071 COVID-19: Secondary | ICD-10-CM | POA: Diagnosis not present

## 2020-08-02 DIAGNOSIS — Z20828 Contact with and (suspected) exposure to other viral communicable diseases: Secondary | ICD-10-CM | POA: Diagnosis not present

## 2020-08-02 DIAGNOSIS — U071 COVID-19: Secondary | ICD-10-CM | POA: Diagnosis not present

## 2020-08-04 DIAGNOSIS — E1142 Type 2 diabetes mellitus with diabetic polyneuropathy: Secondary | ICD-10-CM | POA: Diagnosis not present

## 2020-08-04 DIAGNOSIS — L609 Nail disorder, unspecified: Secondary | ICD-10-CM | POA: Diagnosis not present

## 2020-08-04 DIAGNOSIS — L110 Acquired keratosis follicularis: Secondary | ICD-10-CM | POA: Diagnosis not present

## 2020-08-04 DIAGNOSIS — E1140 Type 2 diabetes mellitus with diabetic neuropathy, unspecified: Secondary | ICD-10-CM | POA: Diagnosis not present

## 2020-08-09 DIAGNOSIS — U071 COVID-19: Secondary | ICD-10-CM | POA: Diagnosis not present

## 2020-08-09 DIAGNOSIS — Z20828 Contact with and (suspected) exposure to other viral communicable diseases: Secondary | ICD-10-CM | POA: Diagnosis not present

## 2020-08-16 DIAGNOSIS — Z20828 Contact with and (suspected) exposure to other viral communicable diseases: Secondary | ICD-10-CM | POA: Diagnosis not present

## 2020-08-16 DIAGNOSIS — U071 COVID-19: Secondary | ICD-10-CM | POA: Diagnosis not present

## 2020-08-23 DIAGNOSIS — Z20828 Contact with and (suspected) exposure to other viral communicable diseases: Secondary | ICD-10-CM | POA: Diagnosis not present

## 2020-08-23 DIAGNOSIS — U071 COVID-19: Secondary | ICD-10-CM | POA: Diagnosis not present

## 2020-08-24 DIAGNOSIS — F17210 Nicotine dependence, cigarettes, uncomplicated: Secondary | ICD-10-CM | POA: Diagnosis not present

## 2020-08-24 DIAGNOSIS — E1165 Type 2 diabetes mellitus with hyperglycemia: Secondary | ICD-10-CM | POA: Diagnosis not present

## 2020-08-30 DIAGNOSIS — Z20828 Contact with and (suspected) exposure to other viral communicable diseases: Secondary | ICD-10-CM | POA: Diagnosis not present

## 2020-08-30 DIAGNOSIS — U071 COVID-19: Secondary | ICD-10-CM | POA: Diagnosis not present

## 2020-09-07 DIAGNOSIS — F3163 Bipolar disorder, current episode mixed, severe, without psychotic features: Secondary | ICD-10-CM | POA: Diagnosis not present

## 2020-09-07 DIAGNOSIS — J449 Chronic obstructive pulmonary disease, unspecified: Secondary | ICD-10-CM | POA: Diagnosis not present

## 2020-09-07 DIAGNOSIS — Z1389 Encounter for screening for other disorder: Secondary | ICD-10-CM | POA: Diagnosis not present

## 2020-09-07 DIAGNOSIS — I10 Essential (primary) hypertension: Secondary | ICD-10-CM | POA: Diagnosis not present

## 2020-09-07 DIAGNOSIS — Z1331 Encounter for screening for depression: Secondary | ICD-10-CM | POA: Diagnosis not present

## 2020-09-07 DIAGNOSIS — E1165 Type 2 diabetes mellitus with hyperglycemia: Secondary | ICD-10-CM | POA: Diagnosis not present

## 2020-09-13 DIAGNOSIS — Z20828 Contact with and (suspected) exposure to other viral communicable diseases: Secondary | ICD-10-CM | POA: Diagnosis not present

## 2020-09-13 DIAGNOSIS — U071 COVID-19: Secondary | ICD-10-CM | POA: Diagnosis not present

## 2020-09-14 ENCOUNTER — Ambulatory Visit (INDEPENDENT_AMBULATORY_CARE_PROVIDER_SITE_OTHER): Payer: Medicare Other | Admitting: Nurse Practitioner

## 2020-09-14 ENCOUNTER — Encounter: Payer: Self-pay | Admitting: Nurse Practitioner

## 2020-09-14 ENCOUNTER — Other Ambulatory Visit: Payer: Self-pay

## 2020-09-14 VITALS — BP 133/82 | HR 81 | Ht 69.0 in | Wt 217.0 lb

## 2020-09-14 DIAGNOSIS — E782 Mixed hyperlipidemia: Secondary | ICD-10-CM

## 2020-09-14 DIAGNOSIS — I10 Essential (primary) hypertension: Secondary | ICD-10-CM | POA: Diagnosis not present

## 2020-09-14 DIAGNOSIS — E1165 Type 2 diabetes mellitus with hyperglycemia: Secondary | ICD-10-CM | POA: Diagnosis not present

## 2020-09-14 LAB — POCT GLYCOSYLATED HEMOGLOBIN (HGB A1C): Hemoglobin A1C: 8.2 % — AB (ref 4.0–5.6)

## 2020-09-14 MED ORDER — LANTUS SOLOSTAR 100 UNIT/ML ~~LOC~~ SOPN: 50.0000 [IU] | PEN_INJECTOR | Freq: Every day | SUBCUTANEOUS | 2 refills | Status: DC

## 2020-09-14 NOTE — Patient Instructions (Signed)
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
## 2020-09-15 DIAGNOSIS — N390 Urinary tract infection, site not specified: Secondary | ICD-10-CM | POA: Diagnosis not present

## 2020-09-22 ENCOUNTER — Other Ambulatory Visit: Payer: Self-pay | Admitting: Nurse Practitioner

## 2020-09-22 DIAGNOSIS — Z79899 Other long term (current) drug therapy: Secondary | ICD-10-CM | POA: Diagnosis not present

## 2020-09-22 DIAGNOSIS — Z0001 Encounter for general adult medical examination with abnormal findings: Secondary | ICD-10-CM | POA: Diagnosis not present

## 2020-09-22 DIAGNOSIS — E1165 Type 2 diabetes mellitus with hyperglycemia: Secondary | ICD-10-CM | POA: Diagnosis not present

## 2020-09-27 DIAGNOSIS — Z20828 Contact with and (suspected) exposure to other viral communicable diseases: Secondary | ICD-10-CM | POA: Diagnosis not present

## 2020-09-27 DIAGNOSIS — U071 COVID-19: Secondary | ICD-10-CM | POA: Diagnosis not present

## 2020-10-04 DIAGNOSIS — Z20828 Contact with and (suspected) exposure to other viral communicable diseases: Secondary | ICD-10-CM | POA: Diagnosis not present

## 2020-10-04 DIAGNOSIS — U071 COVID-19: Secondary | ICD-10-CM | POA: Diagnosis not present

## 2020-10-07 DIAGNOSIS — F17210 Nicotine dependence, cigarettes, uncomplicated: Secondary | ICD-10-CM | POA: Diagnosis not present

## 2020-10-07 DIAGNOSIS — E1165 Type 2 diabetes mellitus with hyperglycemia: Secondary | ICD-10-CM | POA: Diagnosis not present

## 2020-10-07 DIAGNOSIS — J449 Chronic obstructive pulmonary disease, unspecified: Secondary | ICD-10-CM | POA: Diagnosis not present

## 2020-10-11 DIAGNOSIS — Z20828 Contact with and (suspected) exposure to other viral communicable diseases: Secondary | ICD-10-CM | POA: Diagnosis not present

## 2020-10-11 DIAGNOSIS — U071 COVID-19: Secondary | ICD-10-CM | POA: Diagnosis not present

## 2020-10-18 DIAGNOSIS — U071 COVID-19: Secondary | ICD-10-CM | POA: Diagnosis not present

## 2020-10-18 DIAGNOSIS — Z20828 Contact with and (suspected) exposure to other viral communicable diseases: Secondary | ICD-10-CM | POA: Diagnosis not present

## 2020-10-25 DIAGNOSIS — Z20828 Contact with and (suspected) exposure to other viral communicable diseases: Secondary | ICD-10-CM | POA: Diagnosis not present

## 2020-10-25 DIAGNOSIS — U071 COVID-19: Secondary | ICD-10-CM | POA: Diagnosis not present

## 2020-10-26 DIAGNOSIS — Z7984 Long term (current) use of oral hypoglycemic drugs: Secondary | ICD-10-CM | POA: Diagnosis not present

## 2020-10-26 DIAGNOSIS — H25813 Combined forms of age-related cataract, bilateral: Secondary | ICD-10-CM | POA: Diagnosis not present

## 2020-10-26 DIAGNOSIS — E119 Type 2 diabetes mellitus without complications: Secondary | ICD-10-CM | POA: Diagnosis not present

## 2020-10-27 DIAGNOSIS — L110 Acquired keratosis follicularis: Secondary | ICD-10-CM | POA: Diagnosis not present

## 2020-10-27 DIAGNOSIS — E1142 Type 2 diabetes mellitus with diabetic polyneuropathy: Secondary | ICD-10-CM | POA: Diagnosis not present

## 2020-10-27 DIAGNOSIS — E1140 Type 2 diabetes mellitus with diabetic neuropathy, unspecified: Secondary | ICD-10-CM | POA: Diagnosis not present

## 2020-10-27 DIAGNOSIS — L609 Nail disorder, unspecified: Secondary | ICD-10-CM | POA: Diagnosis not present

## 2020-10-28 ENCOUNTER — Other Ambulatory Visit: Payer: Self-pay | Admitting: Nurse Practitioner

## 2020-11-01 DIAGNOSIS — Z20828 Contact with and (suspected) exposure to other viral communicable diseases: Secondary | ICD-10-CM | POA: Diagnosis not present

## 2020-11-01 DIAGNOSIS — U071 COVID-19: Secondary | ICD-10-CM | POA: Diagnosis not present

## 2020-11-04 ENCOUNTER — Other Ambulatory Visit: Payer: Self-pay | Admitting: "Endocrinology

## 2020-11-07 DIAGNOSIS — E1165 Type 2 diabetes mellitus with hyperglycemia: Secondary | ICD-10-CM | POA: Diagnosis not present

## 2020-11-07 DIAGNOSIS — F3163 Bipolar disorder, current episode mixed, severe, without psychotic features: Secondary | ICD-10-CM | POA: Diagnosis not present

## 2020-11-08 DIAGNOSIS — Z20828 Contact with and (suspected) exposure to other viral communicable diseases: Secondary | ICD-10-CM | POA: Diagnosis not present

## 2020-11-08 DIAGNOSIS — U071 COVID-19: Secondary | ICD-10-CM | POA: Diagnosis not present

## 2020-11-15 DIAGNOSIS — Z20828 Contact with and (suspected) exposure to other viral communicable diseases: Secondary | ICD-10-CM | POA: Diagnosis not present

## 2020-11-15 DIAGNOSIS — U071 COVID-19: Secondary | ICD-10-CM | POA: Diagnosis not present

## 2020-11-22 DIAGNOSIS — Z20828 Contact with and (suspected) exposure to other viral communicable diseases: Secondary | ICD-10-CM | POA: Diagnosis not present

## 2020-11-22 DIAGNOSIS — U071 COVID-19: Secondary | ICD-10-CM | POA: Diagnosis not present

## 2020-11-25 ENCOUNTER — Other Ambulatory Visit: Payer: Self-pay | Admitting: Nurse Practitioner

## 2020-11-28 ENCOUNTER — Other Ambulatory Visit: Payer: Self-pay | Admitting: "Endocrinology

## 2020-11-28 DIAGNOSIS — F310 Bipolar disorder, current episode hypomanic: Secondary | ICD-10-CM | POA: Diagnosis not present

## 2020-11-30 DIAGNOSIS — Z20828 Contact with and (suspected) exposure to other viral communicable diseases: Secondary | ICD-10-CM | POA: Diagnosis not present

## 2020-11-30 DIAGNOSIS — U071 COVID-19: Secondary | ICD-10-CM | POA: Diagnosis not present

## 2020-12-08 DIAGNOSIS — E1165 Type 2 diabetes mellitus with hyperglycemia: Secondary | ICD-10-CM | POA: Diagnosis not present

## 2020-12-08 DIAGNOSIS — F17200 Nicotine dependence, unspecified, uncomplicated: Secondary | ICD-10-CM | POA: Diagnosis not present

## 2020-12-27 ENCOUNTER — Other Ambulatory Visit: Payer: Self-pay | Admitting: "Endocrinology

## 2021-01-04 ENCOUNTER — Other Ambulatory Visit: Payer: Self-pay | Admitting: Nurse Practitioner

## 2021-01-07 DIAGNOSIS — F17200 Nicotine dependence, unspecified, uncomplicated: Secondary | ICD-10-CM | POA: Diagnosis not present

## 2021-01-07 DIAGNOSIS — E1165 Type 2 diabetes mellitus with hyperglycemia: Secondary | ICD-10-CM | POA: Diagnosis not present

## 2021-01-10 DIAGNOSIS — E1165 Type 2 diabetes mellitus with hyperglycemia: Secondary | ICD-10-CM | POA: Diagnosis not present

## 2021-01-11 DIAGNOSIS — Z23 Encounter for immunization: Secondary | ICD-10-CM | POA: Diagnosis not present

## 2021-01-11 LAB — COMPREHENSIVE METABOLIC PANEL
ALT: 9 IU/L (ref 0–44)
AST: 11 IU/L (ref 0–40)
Albumin/Globulin Ratio: 1.4 (ref 1.2–2.2)
Albumin: 4 g/dL (ref 3.8–4.9)
Alkaline Phosphatase: 156 IU/L — ABNORMAL HIGH (ref 44–121)
BUN/Creatinine Ratio: 18 (ref 9–20)
BUN: 18 mg/dL (ref 6–24)
Bilirubin Total: <0.2 mg/dL (ref 0.0–1.2)
CO2: 18 mmol/L — ABNORMAL LOW (ref 20–29)
Calcium: 9.3 mg/dL (ref 8.7–10.2)
Chloride: 101 mmol/L (ref 96–106)
Creatinine, Ser: 1 mg/dL (ref 0.76–1.27)
Globulin, Total: 2.9 g/dL (ref 1.5–4.5)
Glucose: 159 mg/dL — ABNORMAL HIGH (ref 70–99)
Potassium: 4.6 mmol/L (ref 3.5–5.2)
Sodium: 138 mmol/L (ref 134–144)
Total Protein: 6.9 g/dL (ref 6.0–8.5)
eGFR: 87 mL/min/{1.73_m2} (ref >59)

## 2021-01-11 LAB — TSH: TSH: 2.4 u[IU]/mL (ref 0.450–4.500)

## 2021-01-11 LAB — T4, FREE: Free T4: 0.87 ng/dL (ref 0.82–1.77)

## 2021-01-17 ENCOUNTER — Ambulatory Visit (INDEPENDENT_AMBULATORY_CARE_PROVIDER_SITE_OTHER): Payer: Medicare Other | Admitting: Nurse Practitioner

## 2021-01-17 ENCOUNTER — Encounter: Payer: Self-pay | Admitting: Nurse Practitioner

## 2021-01-17 ENCOUNTER — Other Ambulatory Visit: Payer: Self-pay

## 2021-01-17 VITALS — BP 126/84 | HR 96 | Ht 69.0 in | Wt 220.0 lb

## 2021-01-17 DIAGNOSIS — I10 Essential (primary) hypertension: Secondary | ICD-10-CM

## 2021-01-17 DIAGNOSIS — E1165 Type 2 diabetes mellitus with hyperglycemia: Secondary | ICD-10-CM

## 2021-01-17 DIAGNOSIS — E782 Mixed hyperlipidemia: Secondary | ICD-10-CM | POA: Diagnosis not present

## 2021-01-17 LAB — POCT GLYCOSYLATED HEMOGLOBIN (HGB A1C): HbA1c, POC (controlled diabetic range): 8.4 % — AB (ref 0.0–7.0)

## 2021-01-17 MED ORDER — GLIPIZIDE ER 5 MG PO TB24: 5.0000 mg | ORAL_TABLET | Freq: Every day | ORAL | 3 refills | Status: DC

## 2021-01-17 NOTE — Patient Instructions (Signed)
Diabetes Mellitus and Nutrition, Adult When you have diabetes, or diabetes mellitus, it is very important to have healthy eating habits because your blood sugar (glucose) levels are greatly affected by what you eat and drink. Eating healthy foods in the right amounts, at about the same times every day, can help you:  Control your blood glucose.  Lower your risk of heart disease.  Improve your blood pressure.  Reach or maintain a healthy weight. What can affect my meal plan? Every person with diabetes is different, and each person has different needs for a meal plan. Your health care provider may recommend that you work with a dietitian to make a meal plan that is best for you. Your meal plan may vary depending on factors such as:  The calories you need.  The medicines you take.  Your weight.  Your blood glucose, blood pressure, and cholesterol levels.  Your activity level.  Other health conditions you have, such as heart or kidney disease. How do carbohydrates affect me? Carbohydrates, also called carbs, affect your blood glucose level more than any other type of food. Eating carbs naturally raises the amount of glucose in your blood. Carb counting is a method for keeping track of how many carbs you eat. Counting carbs is important to keep your blood glucose at a healthy level, especially if you use insulin or take certain oral diabetes medicines. It is important to know how many carbs you can safely have in each meal. This is different for every person. Your dietitian can help you calculate how many carbs you should have at each meal and for each snack. How does alcohol affect me? Alcohol can cause a sudden decrease in blood glucose (hypoglycemia), especially if you use insulin or take certain oral diabetes medicines. Hypoglycemia can be a life-threatening condition. Symptoms of hypoglycemia, such as sleepiness, dizziness, and confusion, are similar to symptoms of having too much  alcohol.  Do not drink alcohol if: ? Your health care provider tells you not to drink. ? You are pregnant, may be pregnant, or are planning to become pregnant.  If you drink alcohol: ? Do not drink on an empty stomach. ? Limit how much you use to:  0-1 drink a day for women.  0-2 drinks a day for men. ? Be aware of how much alcohol is in your drink. In the [place] one drink equals one 12 oz bottle of beer (355 mL), one 5 oz glass of wine (148 mL), or one 1 oz glass of hard liquor (44 mL). ? Keep yourself hydrated with water, diet soda, or unsweetened iced tea.  Keep in mind that regular soda, juice, and other mixers may contain a lot of sugar and must be counted as carbs. What are tips for following this plan? Reading food labels  Start by checking the serving size on the "Nutrition Facts" label of packaged foods and drinks. The amount of calories, carbs, fats, and other nutrients listed on the label is based on one serving of the item. Many items contain more than one serving per package.  Check the total grams (g) of carbs in one serving. You can calculate the number of servings of carbs in one serving by dividing the total carbs by 15. For example, if a food has 30 g of total carbs per serving, it would be equal to 2 servings of carbs.  Check the number of grams (g) of saturated fats and trans fats in one serving. Choose foods that have   a low amount or none of these fats.  Check the number of milligrams (mg) of salt (sodium) in one serving. Most people should limit total sodium intake to less than 2,300 mg per day.  Always check the nutrition information of foods labeled as "low-fat" or "nonfat." These foods may be higher in added sugar or refined carbs and should be avoided.  Talk to your dietitian to identify your daily goals for nutrients listed on the label. Shopping  Avoid buying canned, pre-made, or processed foods. These foods tend to be high in fat, sodium, and added  sugar.  Shop around the outside edge of the grocery store. This is where you will most often find fresh fruits and vegetables, bulk grains, fresh meats, and fresh dairy. Cooking  Use low-heat cooking methods, such as baking, instead of high-heat cooking methods like deep frying.  Cook using healthy oils, such as olive, canola, or sunflower oil.  Avoid cooking with butter, cream, or high-fat meats. Meal planning  Eat meals and snacks regularly, preferably at the same times every day. Avoid going long periods of time without eating.  Eat foods that are high in fiber, such as fresh fruits, vegetables, beans, and whole grains. Talk with your dietitian about how many servings of carbs you can eat at each meal.  Eat 4-6 oz (112-168 g) of lean protein each day, such as lean meat, chicken, fish, eggs, or tofu. One ounce (oz) of lean protein is equal to: ? 1 oz (28 g) of meat, chicken, or fish. ? 1 egg. ?  cup (62 g) of tofu.  Eat some foods each day that contain healthy fats, such as avocado, nuts, seeds, and fish.   What foods should I eat? Fruits Berries. Apples. Oranges. Peaches. Apricots. Plums. Grapes. Mango. Papaya. Pomegranate. Kiwi. Cherries. Vegetables Lettuce. Spinach. Leafy greens, including kale, chard, collard greens, and mustard greens. Beets. Cauliflower. Cabbage. Broccoli. Carrots. Green beans. Tomatoes. Peppers. Onions. Cucumbers. Brussels sprouts. Grains Whole grains, such as whole-wheat or whole-grain bread, crackers, tortillas, cereal, and pasta. Unsweetened oatmeal. Quinoa. Brown or wild rice. Meats and other proteins Seafood. Poultry without skin. Lean cuts of poultry and beef. Tofu. Nuts. Seeds. Dairy Low-fat or fat-free dairy products such as milk, yogurt, and cheese. The items listed above may not be a complete list of foods and beverages you can eat. Contact a dietitian for more information. What foods should I avoid? Fruits Fruits canned with  syrup. Vegetables Canned vegetables. Frozen vegetables with butter or cream sauce. Grains Refined white flour and flour products such as bread, pasta, snack foods, and cereals. Avoid all processed foods. Meats and other proteins Fatty cuts of meat. Poultry with skin. Breaded or fried meats. Processed meat. Avoid saturated fats. Dairy Full-fat yogurt, cheese, or milk. Beverages Sweetened drinks, such as soda or iced tea. The items listed above may not be a complete list of foods and beverages you should avoid. Contact a dietitian for more information. Questions to ask a health care provider  Do I need to meet with a diabetes educator?  Do I need to meet with a dietitian?  What number can I call if I have questions?  When are the best times to check my blood glucose? Where to find more information:  American Diabetes Association: diabetes.org  Academy of Nutrition and Dietetics: www.eatright.org  National Institute of Diabetes and Digestive and Kidney Diseases: www.niddk.nih.gov  Association of Diabetes Care and Education Specialists: www.diabeteseducator.org Summary  It is important to have healthy eating   habits because your blood sugar (glucose) levels are greatly affected by what you eat and drink.  A healthy meal plan will help you control your blood glucose and maintain a healthy lifestyle.  Your health care provider may recommend that you work with a dietitian to make a meal plan that is best for you.  Keep in mind that carbohydrates (carbs) and alcohol have immediate effects on your blood glucose levels. It is important to count carbs and to use alcohol carefully. This information is not intended to replace advice given to you by your health care provider. Make sure you discuss any questions you have with your health care provider. Document Revised: 03/03/2019 Document Reviewed: 03/03/2019 Elsevier Patient Education  2021 [employer]  
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
## 2021-01-19 DIAGNOSIS — L110 Acquired keratosis follicularis: Secondary | ICD-10-CM | POA: Diagnosis not present

## 2021-01-19 DIAGNOSIS — L609 Nail disorder, unspecified: Secondary | ICD-10-CM | POA: Diagnosis not present

## 2021-01-19 DIAGNOSIS — E1140 Type 2 diabetes mellitus with diabetic neuropathy, unspecified: Secondary | ICD-10-CM | POA: Diagnosis not present

## 2021-01-19 DIAGNOSIS — E1142 Type 2 diabetes mellitus with diabetic polyneuropathy: Secondary | ICD-10-CM | POA: Diagnosis not present

## 2021-02-07 DIAGNOSIS — E1165 Type 2 diabetes mellitus with hyperglycemia: Secondary | ICD-10-CM | POA: Diagnosis not present

## 2021-02-07 DIAGNOSIS — F3163 Bipolar disorder, current episode mixed, severe, without psychotic features: Secondary | ICD-10-CM | POA: Diagnosis not present

## 2021-02-16 DIAGNOSIS — M79674 Pain in right toe(s): Secondary | ICD-10-CM | POA: Diagnosis not present

## 2021-02-16 DIAGNOSIS — L03031 Cellulitis of right toe: Secondary | ICD-10-CM | POA: Diagnosis not present

## 2021-02-16 DIAGNOSIS — M79671 Pain in right foot: Secondary | ICD-10-CM | POA: Diagnosis not present

## 2021-02-16 DIAGNOSIS — L600 Ingrowing nail: Secondary | ICD-10-CM | POA: Diagnosis not present

## 2021-02-27 DIAGNOSIS — L03031 Cellulitis of right toe: Secondary | ICD-10-CM | POA: Diagnosis not present

## 2021-02-27 DIAGNOSIS — M79671 Pain in right foot: Secondary | ICD-10-CM | POA: Diagnosis not present

## 2021-02-27 DIAGNOSIS — L600 Ingrowing nail: Secondary | ICD-10-CM | POA: Diagnosis not present

## 2021-02-27 DIAGNOSIS — M79674 Pain in right toe(s): Secondary | ICD-10-CM | POA: Diagnosis not present

## 2021-03-09 DIAGNOSIS — E1165 Type 2 diabetes mellitus with hyperglycemia: Secondary | ICD-10-CM | POA: Diagnosis not present

## 2021-03-09 DIAGNOSIS — R600 Localized edema: Secondary | ICD-10-CM | POA: Diagnosis not present

## 2021-03-09 DIAGNOSIS — F3163 Bipolar disorder, current episode mixed, severe, without psychotic features: Secondary | ICD-10-CM | POA: Diagnosis not present

## 2021-03-09 DIAGNOSIS — J449 Chronic obstructive pulmonary disease, unspecified: Secondary | ICD-10-CM | POA: Diagnosis not present

## 2021-03-13 DIAGNOSIS — R600 Localized edema: Secondary | ICD-10-CM | POA: Diagnosis not present

## 2021-03-13 DIAGNOSIS — E1165 Type 2 diabetes mellitus with hyperglycemia: Secondary | ICD-10-CM | POA: Diagnosis not present

## 2021-03-14 DIAGNOSIS — L03031 Cellulitis of right toe: Secondary | ICD-10-CM | POA: Diagnosis not present

## 2021-03-14 DIAGNOSIS — M79674 Pain in right toe(s): Secondary | ICD-10-CM | POA: Diagnosis not present

## 2021-03-14 DIAGNOSIS — M79671 Pain in right foot: Secondary | ICD-10-CM | POA: Diagnosis not present

## 2021-03-28 DIAGNOSIS — L03031 Cellulitis of right toe: Secondary | ICD-10-CM | POA: Diagnosis not present

## 2021-03-28 DIAGNOSIS — M79674 Pain in right toe(s): Secondary | ICD-10-CM | POA: Diagnosis not present

## 2021-03-28 DIAGNOSIS — M79671 Pain in right foot: Secondary | ICD-10-CM | POA: Diagnosis not present

## 2021-04-09 DIAGNOSIS — E1165 Type 2 diabetes mellitus with hyperglycemia: Secondary | ICD-10-CM | POA: Diagnosis not present

## 2021-04-09 DIAGNOSIS — F17200 Nicotine dependence, unspecified, uncomplicated: Secondary | ICD-10-CM | POA: Diagnosis not present

## 2021-04-20 DIAGNOSIS — L110 Acquired keratosis follicularis: Secondary | ICD-10-CM | POA: Diagnosis not present

## 2021-04-20 DIAGNOSIS — L609 Nail disorder, unspecified: Secondary | ICD-10-CM | POA: Diagnosis not present

## 2021-04-20 DIAGNOSIS — E1140 Type 2 diabetes mellitus with diabetic neuropathy, unspecified: Secondary | ICD-10-CM | POA: Diagnosis not present

## 2021-04-20 DIAGNOSIS — E1142 Type 2 diabetes mellitus with diabetic polyneuropathy: Secondary | ICD-10-CM | POA: Diagnosis not present

## 2021-05-12 ENCOUNTER — Other Ambulatory Visit: Payer: Self-pay

## 2021-05-12 ENCOUNTER — Other Ambulatory Visit (HOSPITAL_COMMUNITY): Payer: Self-pay | Admitting: Gerontology

## 2021-05-12 ENCOUNTER — Ambulatory Visit (HOSPITAL_COMMUNITY)
Admission: RE | Admit: 2021-05-12 | Discharge: 2021-05-12 | Disposition: A | Discharge status: Home / Self Care | Payer: Medicare Other | Source: Ambulatory Visit | Attending: Gerontology | Admitting: Gerontology

## 2021-05-12 DIAGNOSIS — M25562 Pain in left knee: Secondary | ICD-10-CM | POA: Diagnosis not present

## 2021-05-12 DIAGNOSIS — E1165 Type 2 diabetes mellitus with hyperglycemia: Secondary | ICD-10-CM | POA: Diagnosis not present

## 2021-05-12 DIAGNOSIS — G251 Drug-induced tremor: Secondary | ICD-10-CM | POA: Diagnosis not present

## 2021-05-12 DIAGNOSIS — Z79899 Other long term (current) drug therapy: Secondary | ICD-10-CM | POA: Diagnosis not present

## 2021-05-15 DIAGNOSIS — F310 Bipolar disorder, current episode hypomanic: Secondary | ICD-10-CM | POA: Diagnosis not present

## 2021-05-23 ENCOUNTER — Ambulatory Visit (INDEPENDENT_AMBULATORY_CARE_PROVIDER_SITE_OTHER): Payer: Medicare Other | Admitting: Nurse Practitioner

## 2021-05-23 ENCOUNTER — Encounter: Payer: Self-pay | Admitting: Nurse Practitioner

## 2021-05-23 ENCOUNTER — Other Ambulatory Visit: Payer: Self-pay

## 2021-05-23 ENCOUNTER — Other Ambulatory Visit: Payer: Self-pay | Admitting: Nurse Practitioner

## 2021-05-23 VITALS — BP 131/82 | HR 80 | Ht 69.0 in | Wt 222.6 lb

## 2021-05-23 DIAGNOSIS — Z6832 Body mass index (BMI) 32.0-32.9, adult: Secondary | ICD-10-CM

## 2021-05-23 DIAGNOSIS — Z7984 Long term (current) use of oral hypoglycemic drugs: Secondary | ICD-10-CM

## 2021-05-23 DIAGNOSIS — I10 Essential (primary) hypertension: Secondary | ICD-10-CM

## 2021-05-23 DIAGNOSIS — E663 Overweight: Secondary | ICD-10-CM

## 2021-05-23 DIAGNOSIS — E782 Mixed hyperlipidemia: Secondary | ICD-10-CM | POA: Diagnosis not present

## 2021-05-23 DIAGNOSIS — E1165 Type 2 diabetes mellitus with hyperglycemia: Secondary | ICD-10-CM

## 2021-05-23 LAB — POCT UA - MICROALBUMIN
Albumin/Creatinine Ratio, Urine, POC: >300
Creatinine, POC: 50 mg/dL
Microalbumin Ur, POC: 150 mg/L

## 2021-05-23 LAB — POCT GLYCOSYLATED HEMOGLOBIN (HGB A1C): HbA1c, POC (controlled diabetic range): 7.9 % — AB (ref 0.0–7.0)
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
## 2021-05-29 DIAGNOSIS — F5101 Primary insomnia: Secondary | ICD-10-CM | POA: Diagnosis not present

## 2021-05-29 DIAGNOSIS — F319 Bipolar disorder, unspecified: Secondary | ICD-10-CM | POA: Diagnosis not present

## 2021-06-05 DIAGNOSIS — Z7984 Long term (current) use of oral hypoglycemic drugs: Secondary | ICD-10-CM | POA: Diagnosis not present

## 2021-06-05 DIAGNOSIS — H25813 Combined forms of age-related cataract, bilateral: Secondary | ICD-10-CM | POA: Diagnosis not present

## 2021-06-05 DIAGNOSIS — E119 Type 2 diabetes mellitus without complications: Secondary | ICD-10-CM | POA: Diagnosis not present

## 2021-06-09 DIAGNOSIS — E1165 Type 2 diabetes mellitus with hyperglycemia: Secondary | ICD-10-CM | POA: Diagnosis not present

## 2021-06-09 DIAGNOSIS — F3163 Bipolar disorder, current episode mixed, severe, without psychotic features: Secondary | ICD-10-CM | POA: Diagnosis not present

## 2021-06-22 DIAGNOSIS — F5101 Primary insomnia: Secondary | ICD-10-CM | POA: Diagnosis not present

## 2021-06-22 DIAGNOSIS — N390 Urinary tract infection, site not specified: Secondary | ICD-10-CM | POA: Diagnosis not present

## 2021-06-22 DIAGNOSIS — F319 Bipolar disorder, unspecified: Secondary | ICD-10-CM | POA: Diagnosis not present

## 2021-07-10 DIAGNOSIS — J449 Chronic obstructive pulmonary disease, unspecified: Secondary | ICD-10-CM | POA: Diagnosis not present

## 2021-07-10 DIAGNOSIS — E1165 Type 2 diabetes mellitus with hyperglycemia: Secondary | ICD-10-CM | POA: Diagnosis not present

## 2021-07-13 DIAGNOSIS — L609 Nail disorder, unspecified: Secondary | ICD-10-CM | POA: Diagnosis not present

## 2021-07-13 DIAGNOSIS — E1142 Type 2 diabetes mellitus with diabetic polyneuropathy: Secondary | ICD-10-CM | POA: Diagnosis not present

## 2021-07-13 DIAGNOSIS — E1140 Type 2 diabetes mellitus with diabetic neuropathy, unspecified: Secondary | ICD-10-CM | POA: Diagnosis not present

## 2021-07-13 DIAGNOSIS — L110 Acquired keratosis follicularis: Secondary | ICD-10-CM | POA: Diagnosis not present

## 2021-07-20 DIAGNOSIS — F5101 Primary insomnia: Secondary | ICD-10-CM | POA: Diagnosis not present

## 2021-07-20 DIAGNOSIS — F319 Bipolar disorder, unspecified: Secondary | ICD-10-CM | POA: Diagnosis not present

## 2021-07-25 ENCOUNTER — Other Ambulatory Visit: Payer: Self-pay | Admitting: Nurse Practitioner

## 2021-08-09 DIAGNOSIS — J449 Chronic obstructive pulmonary disease, unspecified: Secondary | ICD-10-CM | POA: Diagnosis not present

## 2021-08-09 DIAGNOSIS — E1165 Type 2 diabetes mellitus with hyperglycemia: Secondary | ICD-10-CM | POA: Diagnosis not present

## 2021-08-17 DIAGNOSIS — F5101 Primary insomnia: Secondary | ICD-10-CM | POA: Diagnosis not present

## 2021-08-17 DIAGNOSIS — F319 Bipolar disorder, unspecified: Secondary | ICD-10-CM | POA: Diagnosis not present

## 2021-09-11 ENCOUNTER — Other Ambulatory Visit (HOSPITAL_COMMUNITY): Payer: Self-pay | Admitting: Gerontology

## 2021-09-11 ENCOUNTER — Other Ambulatory Visit: Payer: Self-pay | Admitting: Gerontology

## 2021-09-11 DIAGNOSIS — E1165 Type 2 diabetes mellitus with hyperglycemia: Secondary | ICD-10-CM | POA: Diagnosis not present

## 2021-09-11 DIAGNOSIS — J449 Chronic obstructive pulmonary disease, unspecified: Secondary | ICD-10-CM | POA: Diagnosis not present

## 2021-09-11 DIAGNOSIS — E1159 Type 2 diabetes mellitus with other circulatory complications: Secondary | ICD-10-CM

## 2021-09-11 DIAGNOSIS — F3163 Bipolar disorder, current episode mixed, severe, without psychotic features: Secondary | ICD-10-CM | POA: Diagnosis not present

## 2021-09-11 DIAGNOSIS — Z1389 Encounter for screening for other disorder: Secondary | ICD-10-CM | POA: Diagnosis not present

## 2021-09-11 DIAGNOSIS — Z1331 Encounter for screening for depression: Secondary | ICD-10-CM | POA: Diagnosis not present

## 2021-09-14 DIAGNOSIS — F5101 Primary insomnia: Secondary | ICD-10-CM | POA: Diagnosis not present

## 2021-09-14 DIAGNOSIS — F319 Bipolar disorder, unspecified: Secondary | ICD-10-CM | POA: Diagnosis not present

## 2021-09-19 DIAGNOSIS — E1165 Type 2 diabetes mellitus with hyperglycemia: Secondary | ICD-10-CM | POA: Diagnosis not present

## 2021-09-20 ENCOUNTER — Ambulatory Visit: Payer: Medicare Other | Admitting: Nurse Practitioner

## 2021-09-20 ENCOUNTER — Ambulatory Visit (HOSPITAL_COMMUNITY)
Admission: RE | Admit: 2021-09-20 | Discharge: 2021-09-20 | Disposition: A | Discharge status: Home / Self Care | Payer: Medicare Other | Source: Ambulatory Visit | Attending: Gerontology | Admitting: Gerontology

## 2021-09-20 DIAGNOSIS — Z1159 Encounter for screening for other viral diseases: Secondary | ICD-10-CM | POA: Diagnosis not present

## 2021-09-20 DIAGNOSIS — Z0001 Encounter for general adult medical examination with abnormal findings: Secondary | ICD-10-CM | POA: Diagnosis not present

## 2021-09-20 DIAGNOSIS — E1159 Type 2 diabetes mellitus with other circulatory complications: Secondary | ICD-10-CM | POA: Insufficient documentation

## 2021-09-20 DIAGNOSIS — I739 Peripheral vascular disease, unspecified: Secondary | ICD-10-CM | POA: Diagnosis not present

## 2021-09-20 DIAGNOSIS — Z79899 Other long term (current) drug therapy: Secondary | ICD-10-CM | POA: Diagnosis not present

## 2021-09-20 DIAGNOSIS — E1165 Type 2 diabetes mellitus with hyperglycemia: Secondary | ICD-10-CM | POA: Diagnosis not present

## 2021-09-20 LAB — COMPREHENSIVE METABOLIC PANEL
ALT: 6 IU/L (ref 0–44)
AST: 7 IU/L (ref 0–40)
Albumin/Globulin Ratio: 1.3 (ref 1.2–2.2)
Albumin: 4 g/dL (ref 3.8–4.9)
Alkaline Phosphatase: 158 IU/L — ABNORMAL HIGH (ref 44–121)
BUN/Creatinine Ratio: 20 (ref 10–24)
BUN: 25 mg/dL (ref 8–27)
Bilirubin Total: <0.2 mg/dL (ref 0.0–1.2)
CO2: 20 mmol/L (ref 20–29)
Calcium: 9.5 mg/dL (ref 8.6–10.2)
Chloride: 104 mmol/L (ref 96–106)
Creatinine, Ser: 1.25 mg/dL (ref 0.76–1.27)
Globulin, Total: 3.1 g/dL (ref 1.5–4.5)
Glucose: 193 mg/dL — ABNORMAL HIGH (ref 70–99)
Potassium: 5 mmol/L (ref 3.5–5.2)
Sodium: 139 mmol/L (ref 134–144)
Total Protein: 7.1 g/dL (ref 6.0–8.5)
eGFR: 66 mL/min/{1.73_m2} (ref >59)

## 2021-09-20 LAB — LIPID PANEL
Chol/HDL Ratio: 7.3 ratio — ABNORMAL HIGH (ref 0.0–5.0)
Cholesterol, Total: 248 mg/dL — ABNORMAL HIGH (ref 100–199)
HDL: 34 mg/dL — ABNORMAL LOW (ref >39)
LDL Chol Calc (NIH): 156 mg/dL — ABNORMAL HIGH (ref 0–99)
Triglycerides: 309 mg/dL — ABNORMAL HIGH (ref 0–149)
VLDL Cholesterol Cal: 58 mg/dL — ABNORMAL HIGH (ref 5–40)

## 2021-09-20 LAB — HEMOGLOBIN A1C: Hemoglobin A1C: 8.5

## 2021-09-22 DIAGNOSIS — Z5181 Encounter for therapeutic drug level monitoring: Secondary | ICD-10-CM | POA: Diagnosis not present

## 2021-09-22 DIAGNOSIS — F319 Bipolar disorder, unspecified: Secondary | ICD-10-CM | POA: Diagnosis not present

## 2021-09-25 ENCOUNTER — Other Ambulatory Visit: Payer: Self-pay | Admitting: "Endocrinology

## 2021-09-26 ENCOUNTER — Ambulatory Visit: Payer: Medicare Other | Admitting: Nurse Practitioner

## 2021-10-04 ENCOUNTER — Ambulatory Visit (INDEPENDENT_AMBULATORY_CARE_PROVIDER_SITE_OTHER): Payer: Medicare Other | Admitting: Nurse Practitioner

## 2021-10-04 ENCOUNTER — Encounter: Payer: Self-pay | Admitting: Nurse Practitioner

## 2021-10-04 VITALS — BP 129/82 | HR 73 | Ht 69.0 in | Wt 223.0 lb

## 2021-10-04 DIAGNOSIS — E782 Mixed hyperlipidemia: Secondary | ICD-10-CM

## 2021-10-04 DIAGNOSIS — E1165 Type 2 diabetes mellitus with hyperglycemia: Secondary | ICD-10-CM

## 2021-10-04 DIAGNOSIS — I10 Essential (primary) hypertension: Secondary | ICD-10-CM
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
## 2021-10-11 DIAGNOSIS — E1159 Type 2 diabetes mellitus with other circulatory complications: Secondary | ICD-10-CM | POA: Diagnosis not present

## 2021-10-11 DIAGNOSIS — J449 Chronic obstructive pulmonary disease, unspecified: Secondary | ICD-10-CM | POA: Diagnosis not present

## 2021-10-12 DIAGNOSIS — F5101 Primary insomnia: Secondary | ICD-10-CM | POA: Diagnosis not present

## 2021-10-12 DIAGNOSIS — F319 Bipolar disorder, unspecified: Secondary | ICD-10-CM | POA: Diagnosis not present

## 2021-10-13 ENCOUNTER — Other Ambulatory Visit: Payer: Self-pay | Admitting: Nurse Practitioner

## 2021-10-19 DIAGNOSIS — L110 Acquired keratosis follicularis: Secondary | ICD-10-CM | POA: Diagnosis not present

## 2021-10-19 DIAGNOSIS — L609 Nail disorder, unspecified: Secondary | ICD-10-CM | POA: Diagnosis not present

## 2021-10-19 DIAGNOSIS — E1142 Type 2 diabetes mellitus with diabetic polyneuropathy: Secondary | ICD-10-CM | POA: Diagnosis not present

## 2021-10-19 DIAGNOSIS — E1140 Type 2 diabetes mellitus with diabetic neuropathy, unspecified: Secondary | ICD-10-CM | POA: Diagnosis not present

## 2021-11-10 DIAGNOSIS — F5101 Primary insomnia: Secondary | ICD-10-CM | POA: Diagnosis not present

## 2021-11-10 DIAGNOSIS — F319 Bipolar disorder, unspecified: Secondary | ICD-10-CM | POA: Diagnosis not present

## 2021-11-10 DIAGNOSIS — Z87820 Personal history of traumatic brain injury: Secondary | ICD-10-CM | POA: Diagnosis not present

## 2021-11-11 DIAGNOSIS — F3163 Bipolar disorder, current episode mixed, severe, without psychotic features: Secondary | ICD-10-CM | POA: Diagnosis not present

## 2021-11-11 DIAGNOSIS — E1142 Type 2 diabetes mellitus with diabetic polyneuropathy: Secondary | ICD-10-CM | POA: Diagnosis not present

## 2021-11-20 DIAGNOSIS — N390 Urinary tract infection, site not specified: Secondary | ICD-10-CM | POA: Diagnosis not present

## 2021-11-24 ENCOUNTER — Emergency Department (HOSPITAL_COMMUNITY)
Admission: EM | Admit: 2021-11-24 | Discharge: 2021-11-24 | Disposition: A | Discharge status: Home / Self Care | Payer: Medicare Other | Attending: Emergency Medicine | Admitting: Emergency Medicine

## 2021-11-24 ENCOUNTER — Other Ambulatory Visit: Payer: Self-pay

## 2021-11-24 ENCOUNTER — Encounter (HOSPITAL_COMMUNITY): Payer: Self-pay | Admitting: Emergency Medicine

## 2021-11-24 DIAGNOSIS — R300 Dysuria: Secondary | ICD-10-CM | POA: Diagnosis not present

## 2021-11-24 DIAGNOSIS — N390 Urinary tract infection, site not specified: Secondary | ICD-10-CM | POA: Diagnosis not present

## 2021-11-24 DIAGNOSIS — M545 Low back pain, unspecified: Secondary | ICD-10-CM | POA: Diagnosis not present

## 2021-11-24 DIAGNOSIS — M5459 Other low back pain: Secondary | ICD-10-CM | POA: Diagnosis not present

## 2021-11-24 LAB — URINALYSIS, ROUTINE W REFLEX MICROSCOPIC
Bacteria, UA: NONE SEEN
Bilirubin Urine: NEGATIVE
Glucose, UA: 50 mg/dL — AB
Hgb urine dipstick: NEGATIVE
Ketones, ur: NEGATIVE mg/dL
Leukocytes,Ua: NEGATIVE
Nitrite: NEGATIVE
Protein, ur: 30 mg/dL — AB
Specific Gravity, Urine: 1.004 — ABNORMAL LOW (ref 1.005–1.030)
pH: 7 (ref 5.0–8.0)

## 2021-11-24 MED ORDER — PHENAZOPYRIDINE HCL 200 MG PO TABS: 200.0000 mg | ORAL_TABLET | Freq: Three times a day (TID) | ORAL | 0 refills | Status: DC

## 2021-11-24 MED ORDER — ACETAMINOPHEN 325 MG PO TABS
650.0000 mg | ORAL_TABLET | Freq: Once | ORAL | Status: AC
  Administered 2021-11-24: 650 mg via ORAL
  Filled 2021-11-24: qty 2

## 2021-11-24 NOTE — Discharge Instructions (Addendum)
Please take Tylenol every 6 hours as needed for pain.  You can use Pyridium for the burning in your pee.  Please follow-up with your primary care doctor in 1 week.  Return to the [facility] department for any worsening symptoms.

## 2021-11-24 NOTE — ED Triage Notes (Signed)
Pt presents with lower back pain with dysuria x 1 week, resident of [place], has legal guardian.

## 2021-11-24 NOTE — ED Provider Notes (Signed)
[facility] EMERGENCY DEPARTMENT Provider Note   CSN: [id number] Arrival date & time: 11/24/21  1553     History Chief Complaint  Patient presents with   Back Pain    Scott [name] is a 61 y.o. male patient who presents to the [facility] part with 1 week history of dysuria and low back pain.  Denies any injury or trauma to the lower back.  Denies any hematuria, urinary urgency or frequency.  No fever or chills.  No focal weakness or numbness to the lower extremities.   Back Pain      Home Medications Prior to Admission medications   Medication Sig Start Date End Date Taking? Authorizing Provider  phenazopyridine (PYRIDIUM) 200 MG tablet Take 1 tablet (200 mg total) by mouth 3 (three) times daily. 11/24/21  Yes Scott [name], Scott [name] M, PA-C  acetaminophen (TYLENOL) 325 MG tablet Take 650 mg by mouth every 6 (six) hours as needed.    [provider]  albuterol (VENTOLIN HFA) 108 (90 Base) MCG/ACT inhaler Inhale 2 puffs into the lungs every 6 (six) hours as needed for wheezing or shortness of breath.    [provider]  ALLERGY RELIEF 10 MG tablet Take 10 mg by mouth daily. 04/29/20   [provider]  amLODipine (NORVASC) 5 MG tablet Take 1 tablet (5 mg total) by mouth daily. 06/15/19   Scott Dike, MD  benztropine (COGENTIN) 1 MG tablet Take 1 mg by mouth 2 (two) times daily.  11/05/18   [provider]  divalproex (DEPAKOTE) 500 MG DR tablet Take 1 tablet (500 mg total) by mouth 2 (two) times daily. Patient taking differently: Take 500-1,000 mg by mouth 2 (two) times daily. 05/03/15   Scott [name], Scott [name], MD  DROPSAFE SAFETY PEN NEEDLES 31G X 6 MM MISC USE FOR ONCE DAILY LANTUS ADMINISTRATION. 09/25/21   Scott Romp, NP  [facility] TEST test strip  12/27/20   [provider]  furosemide (LASIX) 20 MG tablet Take 20 mg by mouth daily.    [provider]  gabapentin (NEURONTIN) 100 MG capsule Take 100 mg by mouth at bedtime.     [provider]  gemfibrozil (LOPID) 600 MG tablet Take 1 tablet (600 mg total) by mouth 2 (two) times daily. 11/24/19   [name], [name], MD  glipiZIDE (GLUCOTROL XL) 5 MG 24 hr tablet Take 1 tablet (5 mg total) by mouth daily with breakfast. 01/17/21   Scott Romp, NP  isopropyl alcohol 70 % SOLN  11/21/20   [provider]  LANTUS SOLOSTAR 100 UNIT/ML Solostar Pen INJECT 50 UNITS SUBCUTANEOUSLY AT BEDTIME. (HOLD IF BS BELOW 60:CALL MD IF BS ABOVE 400) 10/13/21   Reardon, Scott Beets, NP  linagliptin (TRADJENTA) 5 MG TABS tablet Take 1 tablet (5 mg total) by mouth daily. 05/03/15   Scott [name], Scott Ard B, MD  lithium carbonate 300 MG capsule Take 300 mg by mouth in the morning and at bedtime.    [provider]  Melatonin 3 MG SUBL Take 1 tablet by mouth at bedtime. 04/22/20   [provider]  meloxicam (MOBIC) 7.5 MG tablet Take 7.5 mg by mouth daily. 05/12/21   [provider]  metFORMIN (GLUCOPHAGE) 1000 MG tablet Take 1,000 mg by mouth 2 (two) times daily. 08/20/19   [provider]  metoprolol tartrate (LOPRESSOR) 25 MG tablet Take 0.5 tablets (12.5 mg total) by mouth 2 (two) times daily. 05/03/15   Scott [name], Scott [name], MD  mirabegron ER (MYRBETRIQ)  25 MG TB24 tablet Take 1 tablet (25 mg total) by mouth daily. 02/29/20   [name], Scott [name], MD  potassium chloride SA (KLOR-CON) 20 MEQ tablet Take 20 mEq by mouth daily.    [provider]  risperiDONE (RISPERDAL) 2 MG tablet Take 1 tablet (2 mg total) by mouth 2 (two) times daily. Patient taking differently: Take 2 mg by mouth daily. 05/03/15   Scott [name], Scott [name], MD  SAFE TUSSIN DM 100-10 MG/5ML liquid as needed. 12/20/20   [provider]  tamsulosin (FLOMAX) 0.4 MG CAPS capsule Take 1 capsule (0.4 mg total) by mouth in the morning and at bedtime. 04/21/20   [name], Scott [name], MD  tiotropium (SPIRIVA) 18 MCG inhalation capsule Place 1 capsule (18 mcg total) into inhaler  and inhale daily. 05/03/15   Scott [name], Scott Ard B, MD  traZODone (DESYREL) 50 MG tablet Take 50 mg by mouth at bedtime.    [provider]      Allergies    Patient has no known allergies.    Review of Systems   Review of Systems  Musculoskeletal:  Positive for back pain.  All other systems reviewed and are negative.   Physical Exam Updated Vital Signs BP (!) 144/88   Pulse 87   Temp 98.2 F (36.8 C) (Oral)   Resp 15   Ht '6\' 2"'$  (1.88 m)   Wt 99.8 kg   SpO2 95%   BMI 28.25 kg/m  Physical Exam Vitals and nursing note reviewed.  Constitutional:      Appearance: Normal appearance.  HENT:     Head: Normocephalic and atraumatic.  Eyes:     General:        Right eye: No discharge.        Left eye: No discharge.     Conjunctiva/sclera: Conjunctivae normal.  Pulmonary:     Effort: Pulmonary effort is normal.  Musculoskeletal:     Comments: No specific point tenderness over the lumbar spine or paralumbar musculature.  Skin:    General: Skin is warm and dry.     Findings: No rash.  Neurological:     General: No focal deficit present.     Mental Status: He is alert.  Psychiatric:        Mood and Affect: Mood normal.        Behavior: Behavior normal.     ED Results / Procedures / Treatments   Labs (all labs ordered are listed, but only abnormal results are displayed) Labs Reviewed  URINALYSIS, ROUTINE W REFLEX MICROSCOPIC - Abnormal; Notable for the following components:      Result Value   Color, Urine COLORLESS (*)    Specific Gravity, Urine 1.004 (*)    Glucose, UA 50 (*)    Protein, ur 30 (*)    All other components within normal limits    EKG None  Radiology No results found.  Procedures Procedures    Medications Ordered in ED Medications  acetaminophen (TYLENOL) tablet 650 mg (has no administration in time range)    ED Course/ Medical Decision Making/ A&P                           Medical Decision Making RICKE Scott [name] is a 61  y.o. male patient who presents to the emergency room today for further evaluation of dysuria and low back pain.  I personally ordered interpreted the urinalysis which does not appear to be infected today.  His back pain is likely muscular in nature.  I will have him take tylenol in addition to some Pyridium for a few days to see if this helps his dysuria.  He was encouraged to return to the [facility] apartment for any worsening symptoms.  He is safe for discharge.   Amount and/or Complexity of Data Reviewed Labs: ordered.   Final Clinical Impression(s) / ED Diagnoses Final diagnoses:  Acute low back pain without sciatica, unspecified back pain laterality  Dysuria    Rx / DC Orders ED Discharge Orders          Ordered    phenazopyridine (PYRIDIUM) 200 MG tablet  3 times daily        11/24/21 1706              Myna Bright [place], [place] 11/24/21 1707    Milton Ferguson, MD 11/25/21 1116

## 2021-11-24 NOTE — ED Notes (Signed)
[facility] [employer] identified as pt legal guardian. This RN spoke with Alfonse Spruce , informing her pt was being discharged back to facility. Requesting discharge summary to be faxed to [phone]. Secretary to fax per request.

## 2021-11-24 NOTE — ED Notes (Signed)
Pt transportation from facility here for discharge ride home. Pt ambulatory off unit. No s/s of acute distress notified at discharge. Discharge summary reviewed with [facility] Nursing staff- Rosann Auerbach, Legal guardian made aware of discharge.

## 2021-11-24 NOTE — ED Notes (Signed)
This RN called [facility] facility reports sending transport for discharge ride home.

## 2021-11-29 ENCOUNTER — Ambulatory Visit (INDEPENDENT_AMBULATORY_CARE_PROVIDER_SITE_OTHER): Payer: Medicare Other | Admitting: Physician Assistant

## 2021-11-29 VITALS — BP 131/76 | HR 85

## 2021-11-29 DIAGNOSIS — N390 Urinary tract infection, site not specified: Secondary | ICD-10-CM | POA: Diagnosis not present

## 2021-11-29 DIAGNOSIS — R339 Retention of urine, unspecified: Secondary | ICD-10-CM | POA: Diagnosis not present

## 2021-11-29 LAB — BLADDER SCAN AMB NON-IMAGING: Scan Result: 238

## 2021-11-29 NOTE — Progress Notes (Signed)
Assessment: 1. Frequent UTI - BLADDER SCAN AMB NON-IMAGING - Urinalysis, Routine w reflex microscopic  2. Incomplete emptying of bladder    Plan: Will increase Myrbetriq to '50mg'$  daily and FU for UA and PVR. Discussed risk of retention. Timed voiding and double voiding discussed. If sxs persist, may consider cysto eval. FU in 6 months for UA and PVR.  Discussed better control of glucose levels which contribute to frequency.  Patient develops worsening symptoms, will follow up sooner.  Chief Complaint: No chief complaint on file.   HPI: Scott [name] is a 61 y.o. male with history of BPH, incomplete emptying, and OAB, last seen here in urology in January 2022, who presents for post ED evaluation for low back pain and dysuria.  [facility].  Urinalysis revealed glucosuria and proteinuria, but no indication of infection.  He was treated with Pyridium and Tylenol. Pt states dysuria resolved, but he continues to have frequency and straining to void. Nocturia x 5 or more/night. No incontinence. No fever chills, NV, abdominal pain. No gross hematuria.  Patient is on Myrbetriq 25 mg daily and Flomax 0.4 daily.  Pt reports recent UTI treatment with Macrobid for 1 month starting August 18 as prescribed by his primary care provider. Unknown whether cx obtained. No records in Epic from PCP. Hgb A1c=8.5 on 09/20/21 UA= clear PVR= 252m, down from >300 last year IPSS=  22         QOL= 5  ED Eval 11/24/21: Scott MCCUISTONis a 61y.o. male patient who presents to the emerged part with 1 week history of dysuria and low back pain.  Denies any injury or trauma to the lower back.  Denies any hematuria, urinary urgency or frequency.  No fever or chills.  No focal weakness or numbness to the lower extremities.   04/21/20 Scott PStauberis a 89yohere for followup for BPH, incomplete emptying and OAB. He continue to have urinary frequency every 1 hour. Nocturia 5-7x.  PVR 387cc. No UTIs since last visit. He is on mirabegron '25mg'$  daily   Portions of the above documentation were copied from a prior visit for review purposes only.  Allergies: No Known Allergies  PMH: Past Medical History:  Diagnosis Date   Arthritis    [place] 1 disorder ([place]    Depression    Diabetes mellitus without complication ([place]    Mental retardation    Pneumonia 2013   Traumatic injury of head 2006   [place]:  Procedure Laterality Date   BRAIN SURGERY     COLONOSCOPY WITH PROPOFOL N/A 02/14/2015   Procedure: COLONOSCOPY WITH PROPOFOL;  Surgeon: RDaneil Dolin MD;  Location: [facility];  Service: Endoscopy;  Laterality: N/A;  cecum time in 0957  time out 1014  total time 17 minutes   HERNIA REPAIR     POLYPECTOMY N/A 02/14/2015   Procedure: POLYPECTOMY;  Surgeon: RDaneil Dolin MD;  Location: [facility];  Service: Endoscopy;  Laterality: N/A;  sigmoid colon, rectal   TRACHEOSTOMY      SH: Social History   Tobacco Use   Smoking status: Never   Smokeless tobacco: Never  Vaping Use   Vaping Use: Never used  Substance Use Topics   Alcohol use: No    Alcohol/week: 0.0 standard drinks of alcohol   Drug use: No    ROS: All other review of systems were reviewed and are  negative except what is noted above in HPI  PE: BP 131/76   Pulse 85  GENERAL APPEARANCE:  Well appearing, well developed, well nourished, NAD HEENT:  Atraumatic, normocephalic NECK:  Supple. Trachea midline ABDOMEN:  Soft, non-tender, no masses EXTREMITIES:  Moves all extremities well, without clubbing, cyanosis NEUROLOGIC:  Alert and oriented x 3 MENTAL STATUS:  appropriate BACK:  Non-tender to palpation, No CVAT SKIN:  Warm, dry, and intact   Results: Laboratory Data: Lab Results  Component Value Date   WBC 8.1 11/24/2019   HGB 12.6 (L) 11/24/2019   HCT 40.5 11/24/2019   MCV 97.1 11/24/2019   PLT 249 11/24/2019    Lab Results  Component Value  Date   CREATININE 1.25 09/19/2021    No results found for: "PSA"  No results found for: "TESTOSTERONE"  Lab Results  Component Value Date   HGBA1C 8.5 09/20/2021    Urinalysis    Component Value Date/Time   COLORURINE COLORLESS (A) 11/24/2021 1624   APPEARANCEUR CLEAR 11/24/2021 1624   APPEARANCEUR Clear 04/21/2020 1052   LABSPEC 1.004 (L) 11/24/2021 1624   PHURINE 7.0 11/24/2021 1624   GLUCOSEU 50 (A) 11/24/2021 1624   HGBUR NEGATIVE 11/24/2021 1624   BILIRUBINUR NEGATIVE 11/24/2021 1624   BILIRUBINUR Negative 04/21/2020 1052   KETONESUR NEGATIVE 11/24/2021 1624   PROTEINUR 30 (A) 11/24/2021 1624   UROBILINOGEN 0.2 07/17/2011 2014   NITRITE NEGATIVE 11/24/2021 1624   LEUKOCYTESUR NEGATIVE 11/24/2021 1624    Lab Results  Component Value Date   LABMICR 375.8 05/10/2020   WBCUA None seen 04/21/2020   LABEPIT None seen 04/21/2020   MUCUS Present 04/21/2020   BACTERIA NONE SEEN 11/24/2021    Pertinent Imaging: No results found for this or any previous visit.  Results for orders placed during the hospital encounter of 11/22/19  US Venous Img Lower Bilateral (DVT)  Narrative CLINICAL DATA:  Bilateral lower extremity edema. Altered mental status.  EXAM: BILATERAL LOWER EXTREMITY VENOUS DOPPLER ULTRASOUND  TECHNIQUE: Gray-scale sonography with compression, as well as color and duplex ultrasound, were performed to evaluate the deep venous system(s) from the level of the common femoral vein through the popliteal and proximal calf veins.  COMPARISON:  None.  FINDINGS: VENOUS  Bilateral lower extremities demonstrate normal compressibility of the common femoral, superficial femoral, and popliteal veins, as well as the visualized calf veins. Visualized portions of profunda femoral vein and great saphenous vein unremarkable. No filling defects to suggest DVT on grayscale or color Doppler imaging. Doppler waveforms show normal direction of venous flow,  normal respiratory plasticity and response to augmentation.  OTHER  None.  Limitations: none  IMPRESSION: Negative.   Electronically Signed By: Marin Olp M.D. On: 11/22/2019 10:58  No results found for this or any previous visit.  No results found for this or any previous visit.  No results found for this or any previous visit.  No results found for this or any previous visit.  No results found for this or any previous visit.  No results found for this or any previous visit.  No results found for this or any previous visit (from the past 24 hour(s)).

## 2021-11-29 NOTE — Patient Instructions (Signed)
Increase Myrbetric to '50mg'$  daily

## 2021-11-30 LAB — MICROSCOPIC EXAMINATION
Bacteria, UA: NONE SEEN
RBC, Urine: NONE SEEN /hpf (ref 0–2)
Renal Epithel, UA: NONE SEEN /hpf
WBC, UA: NONE SEEN /hpf (ref 0–5)

## 2021-11-30 LAB — URINALYSIS, ROUTINE W REFLEX MICROSCOPIC
Bilirubin, UA: NEGATIVE
Glucose, UA: NEGATIVE
Ketones, UA: NEGATIVE
Leukocytes,UA: NEGATIVE
Nitrite, UA: NEGATIVE
RBC, UA: NEGATIVE
Specific Gravity, UA: 1.01 (ref 1.005–1.030)
Urobilinogen, Ur: 0.2 mg/dL (ref 0.2–1.0)
pH, UA: 5.5 (ref 5.0–7.5)

## 2021-12-05 DIAGNOSIS — E1159 Type 2 diabetes mellitus with other circulatory complications: Secondary | ICD-10-CM | POA: Diagnosis not present

## 2021-12-05 DIAGNOSIS — I10 Essential (primary) hypertension: Secondary | ICD-10-CM | POA: Diagnosis not present

## 2021-12-05 DIAGNOSIS — J449 Chronic obstructive pulmonary disease, unspecified: Secondary | ICD-10-CM | POA: Diagnosis not present

## 2021-12-05 DIAGNOSIS — F3163 Bipolar disorder, current episode mixed, severe, without psychotic features: Secondary | ICD-10-CM | POA: Diagnosis not present

## 2021-12-07 DIAGNOSIS — E119 Type 2 diabetes mellitus without complications: Secondary | ICD-10-CM | POA: Diagnosis not present

## 2021-12-07 DIAGNOSIS — F319 Bipolar disorder, unspecified: Secondary | ICD-10-CM | POA: Diagnosis not present

## 2021-12-07 DIAGNOSIS — Z7984 Long term (current) use of oral hypoglycemic drugs: Secondary | ICD-10-CM | POA: Diagnosis not present

## 2021-12-07 DIAGNOSIS — H25813 Combined forms of age-related cataract, bilateral: Secondary | ICD-10-CM | POA: Diagnosis not present

## 2021-12-07 DIAGNOSIS — F5101 Primary insomnia: Secondary | ICD-10-CM | POA: Diagnosis not present

## 2021-12-07 DIAGNOSIS — Z87820 Personal history of traumatic brain injury: Secondary | ICD-10-CM | POA: Diagnosis not present

## 2021-12-19 ENCOUNTER — Other Ambulatory Visit: Payer: Self-pay | Admitting: Nurse Practitioner

## 2021-12-22 ENCOUNTER — Other Ambulatory Visit: Payer: Self-pay | Admitting: Nurse Practitioner

## 2021-12-23 ENCOUNTER — Other Ambulatory Visit: Payer: Self-pay

## 2021-12-23 ENCOUNTER — Encounter (HOSPITAL_COMMUNITY): Payer: Self-pay | Admitting: *Deleted

## 2021-12-23 ENCOUNTER — Emergency Department (HOSPITAL_COMMUNITY): Payer: Medicare Other

## 2021-12-23 ENCOUNTER — Emergency Department (HOSPITAL_COMMUNITY)
Admission: EM | Admit: 2021-12-23 | Discharge: 2021-12-23 | Disposition: A | Discharge status: Skilled Nursing Facility | Payer: Medicare Other | Attending: Emergency Medicine | Admitting: Emergency Medicine

## 2021-12-23 DIAGNOSIS — J181 Lobar pneumonia, unspecified organism: Secondary | ICD-10-CM | POA: Diagnosis not present

## 2021-12-23 DIAGNOSIS — Z20822 Contact with and (suspected) exposure to covid-19: Secondary | ICD-10-CM | POA: Insufficient documentation

## 2021-12-23 DIAGNOSIS — Z7984 Long term (current) use of oral hypoglycemic drugs: Secondary | ICD-10-CM | POA: Insufficient documentation

## 2021-12-23 DIAGNOSIS — Z794 Long term (current) use of insulin: Secondary | ICD-10-CM | POA: Diagnosis not present

## 2021-12-23 DIAGNOSIS — J189 Pneumonia, unspecified organism: Secondary | ICD-10-CM

## 2021-12-23 DIAGNOSIS — Z79899 Other long term (current) drug therapy: Secondary | ICD-10-CM | POA: Insufficient documentation

## 2021-12-23 DIAGNOSIS — J449 Chronic obstructive pulmonary disease, unspecified: Secondary | ICD-10-CM | POA: Diagnosis not present

## 2021-12-23 DIAGNOSIS — Z720 Tobacco use: Secondary | ICD-10-CM | POA: Insufficient documentation

## 2021-12-23 DIAGNOSIS — R059 Cough, unspecified: Secondary | ICD-10-CM | POA: Diagnosis not present

## 2021-12-23 LAB — RESP PANEL BY RT-PCR (FLU A&B, COVID) ARPGX2
Influenza A by PCR: NEGATIVE
Influenza B by PCR: NEGATIVE
SARS Coronavirus 2 by RT PCR: NEGATIVE

## 2021-12-23 MED ORDER — DOXYCYCLINE HYCLATE 100 MG PO CAPS: 100.0000 mg | ORAL_CAPSULE | Freq: Two times a day (BID) | ORAL | 0 refills | Status: AC

## 2021-12-23 MED ORDER — PREDNISONE 20 MG PO TABS: 40.0000 mg | ORAL_TABLET | Freq: Every day | ORAL | 0 refills | Status: DC

## 2021-12-23 MED ORDER — DOXYCYCLINE HYCLATE 100 MG PO CAPS: 100.0000 mg | ORAL_CAPSULE | Freq: Two times a day (BID) | ORAL | 0 refills | Status: DC

## 2021-12-23 MED ORDER — PREDNISONE 20 MG PO TABS: 40.0000 mg | ORAL_TABLET | Freq: Every day | ORAL | 0 refills | Status: AC

## 2021-12-23 NOTE — ED Triage Notes (Signed)
Pt c/o non-productive cough x 3 days; pt c/o some abdominal pain from coughing

## 2021-12-23 NOTE — Discharge Instructions (Addendum)
You are negative for COVID and flu, however there does appear to be some evidence of pneumonia on your chest x-ray.  I have sent you in a prescription for steroids and an antibiotic that you will take twice daily.  Please pick this up on your way home.  Return if you have worsening symptoms and shortness of breath

## 2021-12-23 NOTE — ED Provider Notes (Signed)
[facility] EMERGENCY DEPARTMENT Provider Note   CSN: [id number] Arrival date & time: 12/23/21  3875     History  Chief Complaint  Patient presents with   Cough    Scott [name] is a 61 y.o. male with history of bipolar, tobacco use, dyslipidemia, COPD who presents to the emergency department for evaluation of productive cough x3 days.  Patient states that he is coughing up green phlegm.  Associated symptoms include some abdominal discomfort from frequent coughing.  He denies fevers, chills, vomiting and diarrhea.  No recent sick contacts, no recent travel.     Cough Associated symptoms: no fever        Home Medications Prior to Admission medications   Medication Sig Start Date End Date Taking? Authorizing Provider  acetaminophen (TYLENOL) 325 MG tablet Take 650 mg by mouth every 6 (six) hours as needed.    [provider]  albuterol (VENTOLIN HFA) 108 (90 Base) MCG/ACT inhaler Inhale 2 puffs into the lungs every 6 (six) hours as needed for wheezing or shortness of breath.    [provider]  ALLERGY RELIEF 10 MG tablet Take 10 mg by mouth daily. 04/29/20   [provider]  amLODipine (NORVASC) 5 MG tablet Take 1 tablet (5 mg total) by mouth daily. 06/15/19   [name], MD  benztropine (COGENTIN) 1 MG tablet Take 1 mg by mouth 2 (two) times daily.  11/05/18   [provider]  divalproex (DEPAKOTE) 500 MG DR tablet Take 1 tablet (500 mg total) by mouth 2 (two) times daily. Patient taking differently: Take 500-1,000 mg by mouth 2 (two) times daily. 05/03/15   [name], [name], MD  doxycycline (VIBRAMYCIN) 100 MG capsule Take 1 capsule (100 mg total) by mouth 2 (two) times daily for 5 days. 12/23/21 12/28/21  Tonye Pearson, PA-C  DROPSAFE SAFETY PEN NEEDLES 31G X 6 MM MISC USE FOR ONCE DAILY LANTUS ADMINISTRATION. 09/25/21   [name], NP  [facility] TEST test strip  12/27/20   [provider]  furosemide (LASIX) 20 MG tablet  Take 20 mg by mouth daily.    [provider]  gabapentin (NEURONTIN) 100 MG capsule Take 100 mg by mouth at bedtime.    [provider]  gemfibrozil (LOPID) 600 MG tablet Take 1 tablet (600 mg total) by mouth 2 (two) times daily. 11/24/19   [name], Ankit Chirag, MD  glipiZIDE (GLUCOTROL XL) 5 MG 24 hr tablet TAKE (1) TABLET BY MOUTH ONCE DAILY WITH BREAKFAST. 12/20/21   [name], NP  isopropyl alcohol 70 % SOLN  11/21/20   [provider]  LANTUS SOLOSTAR 100 UNIT/ML Solostar Pen INJECT 50 UNITS SUBCUTANEOUSLY AT BEDTIME. (HOLD IF BS BELOW 60:CALL MD IF BS ABOVE 400) 10/13/21   Reardon, [name], NP  linagliptin (TRADJENTA) 5 MG TABS tablet Take 1 tablet (5 mg total) by mouth daily. 05/03/15   [name], [name], MD  lithium carbonate 300 MG capsule Take 300 mg by mouth in the morning and at bedtime.    [provider]  Melatonin 3 MG SUBL Take 1 tablet by mouth at bedtime. 04/22/20   [provider]  meloxicam (MOBIC) 7.5 MG tablet Take 7.5 mg by mouth daily. 05/12/21   [provider]  metFORMIN (GLUCOPHAGE) 1000 MG tablet Take 1,000 mg by mouth 2 (two) times daily. 08/20/19   [provider]  metoprolol tartrate (LOPRESSOR) 25 MG tablet Take 0.5 tablets (12.5 mg total) by mouth  2 (two) times daily. 05/03/15   [name], [name], MD  mirabegron ER (MYRBETRIQ) 25 MG TB24 tablet Take 1 tablet (25 mg total) by mouth daily. 02/29/20   [name], [name], MD  nitrofurantoin, macrocrystal-monohydrate, (MACROBID) 100 MG capsule Take 100 mg by mouth 2 (two) times daily.    [provider]  phenazopyridine (PYRIDIUM) 200 MG tablet Take 1 tablet (200 mg total) by mouth 3 (three) times daily. 11/24/21   Myna Bright M, PA-C  potassium chloride SA (KLOR-CON) 20 MEQ tablet Take 20 mEq by mouth daily.    [provider]  predniSONE (DELTASONE) 20 MG tablet Take 2 tablets (40 mg total) by mouth daily for 5 days. 12/23/21  12/28/21  Tonye Pearson, PA-C  risperiDONE (RISPERDAL) 2 MG tablet Take 1 tablet (2 mg total) by mouth 2 (two) times daily. Patient taking differently: Take 2 mg by mouth daily. 05/03/15   [name], [name] [name], MD  SAFE TUSSIN DM 100-10 MG/5ML liquid as needed. 12/20/20   [provider]  tamsulosin (FLOMAX) 0.4 MG CAPS capsule Take 1 capsule (0.4 mg total) by mouth in the morning and at bedtime. 04/21/20   [name], [name], MD  tiotropium (SPIRIVA) 18 MCG inhalation capsule Place 1 capsule (18 mcg total) into inhaler and inhale daily. 05/03/15   [name], [name], MD  traZODone (DESYREL) 50 MG tablet Take 50 mg by mouth at bedtime.    [provider]      Allergies    Patient has no known allergies.    Review of Systems   Review of Systems  Constitutional:  Negative for fever.  Respiratory:  Positive for cough.   Gastrointestinal:  Negative for abdominal pain, diarrhea, nausea and vomiting.    Physical Exam Updated Vital Signs BP (!) 142/86 (BP Location: Right Arm)   Pulse 91   Temp 98.5 F (36.9 C) (Oral)   Resp 18   Ht '6\' 2"'$  (1.88 m)   Wt 99.8 kg   SpO2 92%   BMI 28.25 kg/m  Physical Exam Vitals and nursing note reviewed.  Constitutional:      General: He is not in acute distress.    Appearance: He is not ill-appearing.  HENT:     Head: Atraumatic.  Eyes:     Conjunctiva/sclera: Conjunctivae normal.  Cardiovascular:     Rate and Rhythm: Normal rate and regular rhythm.     Pulses: Normal pulses.     Heart sounds: No murmur heard. Pulmonary:     Effort: Pulmonary effort is normal. No respiratory distress.     Comments: Speaking in full complete sentences.  Diminished lung sounds bilaterally Abdominal:     General: Abdomen is flat. There is no distension.     Palpations: Abdomen is soft.     Tenderness: There is no abdominal tenderness.  Musculoskeletal:        General: Normal range of motion.     Cervical back: Normal range of motion.   Skin:    General: Skin is warm and dry.     Capillary Refill: Capillary refill takes less than 2 seconds.  Neurological:     General: No focal deficit present.     Mental Status: He is alert.  Psychiatric:        Mood and Affect: Mood normal.     ED Results / Procedures / Treatments   Labs (all labs ordered are listed, but only abnormal results are displayed) Labs Reviewed  RESP PANEL BY RT-PCR (  FLU A&B, COVID) [id number]    EKG None  Radiology DG Chest 2 View  Result Date: 12/23/2021 CLINICAL DATA:  Productive cough x3 days EXAM: CHEST - 2 VIEW COMPARISON:  Previous studies including the examination of 11/23/2019 FINDINGS: Cardiac size is within normal limits. There are no signs of pulmonary edema. There is increase in interstitial markings in both lower lung fields. There is no focal consolidation. There is no pleural effusion or pneumothorax. Few blebs and bullae are noted in the apices. IMPRESSION: There is interval increase in interstitial markings in both lower lung fields suggesting atelectasis/pneumonia. There is no focal pulmonary consolidation. There is no pleural effusion. Electronically Signed   By: Elmer Picker M.D.   On: 12/23/2021 10:07    Procedures Procedures    Medications Ordered in ED Medications - No data to display  ED Course/ Medical Decision Making/ A&P                           Medical Decision Making Amount and/or Complexity of Data Reviewed Radiology: ordered.  Risk Prescription drug management.   Social determinants of health:  Social History   Socioeconomic History   Marital status: Single    Spouse name: Not on file   Number of children: 2   Years of education: Not on file   Highest education level: Not on file  Occupational History   Occupation: electrician  Tobacco Use   Smoking status: Never   Smokeless tobacco: Never  Vaping Use   Vaping Use: Never used  Substance and Sexual Activity   Alcohol use: No     Alcohol/week: 0.0 standard drinks of alcohol   Drug use: No   Sexual activity: Not on file  Other Topics Concern   Not on file  Social History Narrative   Not on file   Social Determinants of Health   Financial Resource Strain: Not on file  Food Insecurity: Not on file  Transportation Needs: Not on file  Physical Activity: Not on file  Stress: Not on file  Social Connections: Not on file  Intimate Partner Violence: Not on file     Initial impression:  This patient presents to the ED for concern of cough, this involves an extensive number of treatment options, and is a complaint that carries with it a high risk of complications and morbidity.   Differentials include viral upper respiratory infection, COVID, pneumonia, COPD exacerbation.   Comorbidities affecting care:  Per HPI  Additional history obtained: Chart review  Lab Tests  I Ordered, reviewed, and interpreted labs and EKG.  The pertinent results include:  Negative for COVID and flu  Imaging Studies ordered:  I ordered imaging studies including  Chest x-ray concerning for pneumonia of the bilateral lower lungs I independently visualized and interpreted imaging and I agree with the radiologist interpretation.   ED Course/Re-evaluation: Presents in no acute distress and is nontoxic-appearing.  Vitals without significant abnormality.  He is satting at around 92% on room air during my examination which is consistent with his COPD status.  He is overall well-appearing.  Lungs appear diminished bilaterally without wheezes rales or rhonchi.  Normal respiratory effort without accessory muscle usage.  COVID test and flu test negative.  Chest x-ray concerning for bilateral lower lobe pneumonia.  Will discharge home with prescription for steroids and doxycycline.  I attempted to notify patient's legal guardian, however they are not available over the weekends.  Patient is stable for  discharge.  Disposition:  After  consideration of the diagnostic results, physical exam, history and the patients response to treatment feel that the patent would benefit from discharge.   CAP: Plan and management as described above. Discharged home in good condition.   Final Clinical Impression(s) / ED Diagnoses Final diagnoses:  Community acquired pneumonia, unspecified laterality    Rx / DC Orders ED Discharge Orders          Ordered    predniSONE (DELTASONE) 20 MG tablet  Daily,   Status:  Discontinued        12/23/21 1103    doxycycline (VIBRAMYCIN) 100 MG capsule  2 times daily,   Status:  Discontinued        12/23/21 1103    doxycycline (VIBRAMYCIN) 100 MG capsule  2 times daily        12/23/21 1107    predniSONE (DELTASONE) 20 MG tablet  Daily        12/23/21 1107              Tonye Pearson, PA-C 12/23/21 1329    Wynona Dove A, DO 12/24/21 1406

## 2022-01-02 DIAGNOSIS — J189 Pneumonia, unspecified organism: Secondary | ICD-10-CM | POA: Diagnosis not present

## 2022-01-02 DIAGNOSIS — I10 Essential (primary) hypertension: Secondary | ICD-10-CM | POA: Diagnosis not present

## 2022-01-02 DIAGNOSIS — E1159 Type 2 diabetes mellitus with other circulatory complications: Secondary | ICD-10-CM | POA: Diagnosis not present

## 2022-01-03 DIAGNOSIS — Z23 Encounter for immunization: Secondary | ICD-10-CM | POA: Diagnosis not present

## 2022-01-04 DIAGNOSIS — Z87820 Personal history of traumatic brain injury: Secondary | ICD-10-CM | POA: Diagnosis not present

## 2022-01-04 DIAGNOSIS — F5101 Primary insomnia: Secondary | ICD-10-CM | POA: Diagnosis not present

## 2022-01-04 DIAGNOSIS — F319 Bipolar disorder, unspecified: Secondary | ICD-10-CM | POA: Diagnosis not present

## 2022-01-25 ENCOUNTER — Ambulatory Visit (HOSPITAL_COMMUNITY)
Admission: RE | Admit: 2022-01-25 | Discharge: 2022-01-25 | Disposition: A | Discharge status: Home / Self Care | Payer: Medicare Other | Source: Ambulatory Visit | Attending: Gerontology | Admitting: Gerontology

## 2022-01-25 ENCOUNTER — Other Ambulatory Visit (HOSPITAL_COMMUNITY): Payer: Self-pay | Admitting: Gerontology

## 2022-01-25 DIAGNOSIS — E1140 Type 2 diabetes mellitus with diabetic neuropathy, unspecified: Secondary | ICD-10-CM | POA: Diagnosis not present

## 2022-01-25 DIAGNOSIS — E1142 Type 2 diabetes mellitus with diabetic polyneuropathy: Secondary | ICD-10-CM | POA: Diagnosis not present

## 2022-01-25 DIAGNOSIS — J111 Influenza due to unidentified influenza virus with other respiratory manifestations: Secondary | ICD-10-CM | POA: Insufficient documentation

## 2022-01-25 DIAGNOSIS — L609 Nail disorder, unspecified: Secondary | ICD-10-CM | POA: Diagnosis not present

## 2022-01-25 DIAGNOSIS — L110 Acquired keratosis follicularis: Secondary | ICD-10-CM | POA: Diagnosis not present

## 2022-02-01 DIAGNOSIS — F319 Bipolar disorder, unspecified: Secondary | ICD-10-CM | POA: Diagnosis not present

## 2022-02-01 DIAGNOSIS — F5101 Primary insomnia: Secondary | ICD-10-CM | POA: Diagnosis not present

## 2022-02-01 DIAGNOSIS — Z87820 Personal history of traumatic brain injury: Secondary | ICD-10-CM | POA: Diagnosis not present

## 2022-02-02 DIAGNOSIS — I10 Essential (primary) hypertension: Secondary | ICD-10-CM | POA: Diagnosis not present

## 2022-02-02 DIAGNOSIS — J449 Chronic obstructive pulmonary disease, unspecified: Secondary | ICD-10-CM | POA: Diagnosis not present

## 2022-02-06 ENCOUNTER — Ambulatory Visit (INDEPENDENT_AMBULATORY_CARE_PROVIDER_SITE_OTHER): Payer: Medicare Other | Admitting: Nurse Practitioner

## 2022-02-06 ENCOUNTER — Encounter: Payer: Self-pay | Admitting: Nurse Practitioner

## 2022-02-06 VITALS — BP 140/90 | HR 76 | Ht 69.0 in | Wt 222.6 lb

## 2022-02-06 DIAGNOSIS — I10 Essential (primary) hypertension: Secondary | ICD-10-CM

## 2022-02-06 DIAGNOSIS — E1165 Type 2 diabetes mellitus with hyperglycemia: Secondary | ICD-10-CM

## 2022-02-06 DIAGNOSIS — E782 Mixed hyperlipidemia: Secondary | ICD-10-CM

## 2022-02-06 LAB — POCT GLYCOSYLATED HEMOGLOBIN (HGB A1C): Hemoglobin A1C: 7.2 % — AB (ref 4.0–5.6)

## 2022-02-06 MED ORDER — LANTUS SOLOSTAR 100 UNIT/ML ~~LOC~~ SOPN: 60.0000 [IU] | PEN_INJECTOR | Freq: Every day | SUBCUTANEOUS | 3 refills | Status: DC
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
## 2022-02-09 DIAGNOSIS — F319 Bipolar disorder, unspecified: Secondary | ICD-10-CM | POA: Diagnosis not present

## 2022-02-09 DIAGNOSIS — Z79899 Other long term (current) drug therapy: Secondary | ICD-10-CM | POA: Diagnosis not present

## 2022-02-09 DIAGNOSIS — Z5181 Encounter for therapeutic drug level monitoring: Secondary | ICD-10-CM | POA: Diagnosis not present

## 2022-02-21 DIAGNOSIS — C44519 Basal cell carcinoma of skin of other part of trunk: Secondary | ICD-10-CM | POA: Diagnosis not present

## 2022-02-21 DIAGNOSIS — L738 Other specified follicular disorders: Secondary | ICD-10-CM | POA: Diagnosis not present

## 2022-02-26 DIAGNOSIS — F5101 Primary insomnia: Secondary | ICD-10-CM | POA: Diagnosis not present

## 2022-02-26 DIAGNOSIS — Z87820 Personal history of traumatic brain injury: Secondary | ICD-10-CM | POA: Diagnosis not present

## 2022-02-26 DIAGNOSIS — F319 Bipolar disorder, unspecified: Secondary | ICD-10-CM | POA: Diagnosis not present

## 2022-03-05 DIAGNOSIS — J449 Chronic obstructive pulmonary disease, unspecified: Secondary | ICD-10-CM | POA: Diagnosis not present

## 2022-03-05 DIAGNOSIS — I10 Essential (primary) hypertension: Secondary | ICD-10-CM | POA: Diagnosis not present

## 2022-03-14 DIAGNOSIS — F3163 Bipolar disorder, current episode mixed, severe, without psychotic features: Secondary | ICD-10-CM | POA: Diagnosis not present

## 2022-03-14 DIAGNOSIS — I739 Peripheral vascular disease, unspecified: Secondary | ICD-10-CM | POA: Diagnosis not present

## 2022-03-14 DIAGNOSIS — J449 Chronic obstructive pulmonary disease, unspecified: Secondary | ICD-10-CM | POA: Diagnosis not present

## 2022-03-14 DIAGNOSIS — I10 Essential (primary) hypertension: Secondary | ICD-10-CM | POA: Diagnosis not present

## 2022-03-14 DIAGNOSIS — E1142 Type 2 diabetes mellitus with diabetic polyneuropathy: Secondary | ICD-10-CM | POA: Diagnosis not present

## 2022-03-26 ENCOUNTER — Other Ambulatory Visit: Payer: Self-pay | Admitting: Nurse Practitioner

## 2022-04-06 DIAGNOSIS — F5101 Primary insomnia: Secondary | ICD-10-CM | POA: Diagnosis not present

## 2022-04-06 DIAGNOSIS — Z87820 Personal history of traumatic brain injury: Secondary | ICD-10-CM | POA: Diagnosis not present

## 2022-04-06 DIAGNOSIS — F319 Bipolar disorder, unspecified: Secondary | ICD-10-CM | POA: Diagnosis not present

## 2022-04-11 DIAGNOSIS — E1165 Type 2 diabetes mellitus with hyperglycemia: Secondary | ICD-10-CM | POA: Diagnosis not present

## 2022-04-11 DIAGNOSIS — D0359 Melanoma in situ of other part of trunk: Secondary | ICD-10-CM | POA: Diagnosis not present

## 2022-04-11 DIAGNOSIS — Z08 Encounter for follow-up examination after completed treatment for malignant neoplasm: Secondary | ICD-10-CM | POA: Diagnosis not present

## 2022-04-11 DIAGNOSIS — Z85828 Personal history of other malignant neoplasm of skin: Secondary | ICD-10-CM | POA: Diagnosis not present

## 2022-04-14 DIAGNOSIS — E1142 Type 2 diabetes mellitus with diabetic polyneuropathy: Secondary | ICD-10-CM | POA: Diagnosis not present

## 2022-04-14 DIAGNOSIS — I10 Essential (primary) hypertension: Secondary | ICD-10-CM | POA: Diagnosis not present

## 2022-04-26 DIAGNOSIS — E1140 Type 2 diabetes mellitus with diabetic neuropathy, unspecified: Secondary | ICD-10-CM | POA: Diagnosis not present

## 2022-04-26 DIAGNOSIS — E1142 Type 2 diabetes mellitus with diabetic polyneuropathy: Secondary | ICD-10-CM | POA: Diagnosis not present

## 2022-04-26 DIAGNOSIS — L609 Nail disorder, unspecified: Secondary | ICD-10-CM | POA: Diagnosis not present

## 2022-04-26 DIAGNOSIS — L110 Acquired keratosis follicularis: Secondary | ICD-10-CM | POA: Diagnosis not present

## 2022-05-02 DIAGNOSIS — D0359 Melanoma in situ of other part of trunk: Secondary | ICD-10-CM | POA: Diagnosis not present

## 2022-05-04 DIAGNOSIS — Z87820 Personal history of traumatic brain injury: Secondary | ICD-10-CM | POA: Diagnosis not present

## 2022-05-04 DIAGNOSIS — F5101 Primary insomnia: Secondary | ICD-10-CM | POA: Diagnosis not present

## 2022-05-04 DIAGNOSIS — F319 Bipolar disorder, unspecified: Secondary | ICD-10-CM | POA: Diagnosis not present

## 2022-05-09 ENCOUNTER — Other Ambulatory Visit: Payer: Self-pay | Admitting: Nurse Practitioner

## 2022-05-16 DIAGNOSIS — I10 Essential (primary) hypertension: Secondary | ICD-10-CM | POA: Diagnosis not present

## 2022-05-16 DIAGNOSIS — E1142 Type 2 diabetes mellitus with diabetic polyneuropathy: Secondary | ICD-10-CM | POA: Diagnosis not present

## 2022-05-17 ENCOUNTER — Other Ambulatory Visit: Payer: Self-pay | Admitting: Nurse Practitioner

## 2022-05-30 DIAGNOSIS — E1165 Type 2 diabetes mellitus with hyperglycemia: Secondary | ICD-10-CM | POA: Diagnosis not present

## 2022-05-31 DIAGNOSIS — F319 Bipolar disorder, unspecified: Secondary | ICD-10-CM | POA: Diagnosis not present

## 2022-05-31 DIAGNOSIS — F5101 Primary insomnia: Secondary | ICD-10-CM | POA: Diagnosis not present

## 2022-05-31 DIAGNOSIS — Z87820 Personal history of traumatic brain injury: Secondary | ICD-10-CM | POA: Diagnosis not present

## 2022-05-31 LAB — COMPREHENSIVE METABOLIC PANEL
ALT: 12 IU/L (ref 0–44)
AST: 14 IU/L (ref 0–40)
Albumin/Globulin Ratio: 1.5 (ref 1.2–2.2)
Albumin: 3.8 g/dL (ref 3.8–4.9)
Alkaline Phosphatase: 81 IU/L (ref 44–121)
BUN/Creatinine Ratio: 16 (ref 10–24)
BUN: 22 mg/dL (ref 8–27)
Bilirubin Total: 0.3 mg/dL (ref 0.0–1.2)
CO2: 20 mmol/L (ref 20–29)
Calcium: 9.2 mg/dL (ref 8.6–10.2)
Chloride: 103 mmol/L (ref 96–106)
Creatinine, Ser: 1.4 mg/dL — ABNORMAL HIGH (ref 0.76–1.27)
Globulin, Total: 2.6 g/dL (ref 1.5–4.5)
Glucose: 142 mg/dL — ABNORMAL HIGH (ref 70–99)
Potassium: 5.4 mmol/L — ABNORMAL HIGH (ref 3.5–5.2)
Sodium: 139 mmol/L (ref 134–144)
Total Protein: 6.4 g/dL (ref 6.0–8.5)
eGFR: 58 mL/min/{1.73_m2} — ABNORMAL LOW (ref >59)

## 2022-06-07 ENCOUNTER — Ambulatory Visit (INDEPENDENT_AMBULATORY_CARE_PROVIDER_SITE_OTHER): Payer: Medicare Other | Admitting: Nurse Practitioner

## 2022-06-07 ENCOUNTER — Encounter: Payer: Self-pay | Admitting: Nurse Practitioner

## 2022-06-07 VITALS — BP 135/81 | HR 80 | Ht 69.0 in | Wt 229.4 lb

## 2022-06-07 DIAGNOSIS — E782 Mixed hyperlipidemia: Secondary | ICD-10-CM | POA: Diagnosis not present

## 2022-06-07 DIAGNOSIS — E1165 Type 2 diabetes mellitus with hyperglycemia: Secondary | ICD-10-CM

## 2022-06-07 DIAGNOSIS — E559 Vitamin D deficiency, unspecified: Secondary | ICD-10-CM | POA: Diagnosis not present

## 2022-06-07 DIAGNOSIS — I10 Essential (primary) hypertension: Secondary | ICD-10-CM | POA: Diagnosis not present

## 2022-06-07 LAB — POCT UA - MICROALBUMIN
Albumin/Creatinine Ratio, Urine, POC: >300
Creatinine, POC: 10 mg/dL
Microalbumin Ur, POC: 150 mg/L

## 2022-06-07 LAB — POCT GLYCOSYLATED HEMOGLOBIN (HGB A1C): Hemoglobin A1C: 6.8 % — AB (ref 4.0–5.6)

## 2022-06-07 MED ORDER — METFORMIN HCL 1000 MG PO TABS: 500.0000 mg | ORAL_TABLET | Freq: Two times a day (BID) | ORAL | 3 refills | Status: DC
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
## 2022-06-14 DIAGNOSIS — E1142 Type 2 diabetes mellitus with diabetic polyneuropathy: Secondary | ICD-10-CM | POA: Diagnosis not present

## 2022-06-14 DIAGNOSIS — I10 Essential (primary) hypertension: Secondary | ICD-10-CM | POA: Diagnosis not present

## 2022-06-22 ENCOUNTER — Other Ambulatory Visit: Payer: Self-pay

## 2022-06-22 ENCOUNTER — Emergency Department (HOSPITAL_COMMUNITY)
Admission: EM | Admit: 2022-06-22 | Discharge: 2022-06-22 | Disposition: A | Discharge status: Skilled Nursing Facility | Payer: Medicare Other | Attending: Emergency Medicine | Admitting: Emergency Medicine

## 2022-06-22 ENCOUNTER — Encounter (HOSPITAL_COMMUNITY): Payer: Self-pay | Admitting: *Deleted

## 2022-06-22 DIAGNOSIS — Z794 Long term (current) use of insulin: Secondary | ICD-10-CM | POA: Insufficient documentation

## 2022-06-22 DIAGNOSIS — I10 Essential (primary) hypertension: Secondary | ICD-10-CM | POA: Insufficient documentation

## 2022-06-22 DIAGNOSIS — R300 Dysuria: Secondary | ICD-10-CM | POA: Insufficient documentation

## 2022-06-22 DIAGNOSIS — J449 Chronic obstructive pulmonary disease, unspecified: Secondary | ICD-10-CM | POA: Diagnosis not present

## 2022-06-22 DIAGNOSIS — Z79899 Other long term (current) drug therapy: Secondary | ICD-10-CM | POA: Insufficient documentation

## 2022-06-22 DIAGNOSIS — R339 Retention of urine, unspecified: Secondary | ICD-10-CM

## 2022-06-22 LAB — URINALYSIS, ROUTINE W REFLEX MICROSCOPIC
Bilirubin Urine: NEGATIVE
Glucose, UA: NEGATIVE mg/dL
Hgb urine dipstick: NEGATIVE
Ketones, ur: NEGATIVE mg/dL
Leukocytes,Ua: NEGATIVE
Nitrite: NEGATIVE
Protein, ur: 30 mg/dL — AB
Specific Gravity, Urine: <1.005 — ABNORMAL LOW (ref 1.005–1.030)
pH: 6 (ref 5.0–8.0)

## 2022-06-22 LAB — URINALYSIS, MICROSCOPIC (REFLEX): Squamous Epithelial / HPF: NONE SEEN /HPF (ref 0–5)

## 2022-06-22 LAB — CBG MONITORING, ED: Glucose-Capillary: 98 mg/dL (ref 70–99)

## 2022-06-22 NOTE — ED Triage Notes (Signed)
Pt recently finished an antibiotic for UTI, pt with pain to groin and only urination small amount at a time.  Pt is incont of urine, which is not new per pt.  Hurts with urination.  Denies any fevers or N/V

## 2022-06-22 NOTE — ED Provider Notes (Signed)
[facility] Provider Note   CSN: [id number] Arrival date & time: 06/22/22  1010     History  Chief Complaint  Patient presents with   Dysuria    Scott [name] is a 62 y.o. male.  Patient states that he has not urinated since yesterday and feels like his abdomen is swollen.  Patient has a past medical history of hypertension and COPD  The history is provided by the patient and a friend. No language interpreter was used.  Dysuria Presenting symptoms: dysuria   Context: not after injury   Relieved by:  Nothing Worsened by:  Nothing Ineffective treatments:  None tried Associated symptoms: abdominal pain   Associated symptoms: no diarrhea, no hematuria and no urinary frequency   Risk factors: no bladder surgery        Home Medications Prior to Admission medications   Medication Sig Start Date End Date Taking? Authorizing Provider  acetaminophen (TYLENOL) 325 MG tablet Take 650 mg by mouth every 6 (six) hours as needed.    [provider]  albuterol (VENTOLIN HFA) 108 (90 Base) MCG/ACT inhaler Inhale 2 puffs into the lungs every 6 (six) hours as needed for wheezing or shortness of breath.    [provider]  ALLERGY RELIEF 10 MG tablet Take 10 mg by mouth daily. 04/29/20   [provider]  amLODipine (NORVASC) 5 MG tablet Take 1 tablet (5 mg total) by mouth daily. 06/15/19   [name], MD  benztropine (COGENTIN) 1 MG tablet Take 1 mg by mouth 2 (two) times daily.  11/05/18   [provider]  divalproex (DEPAKOTE) 500 MG DR tablet Take 1 tablet (500 mg total) by mouth 2 (two) times daily. Patient taking differently: Take 500-1,000 mg by mouth 2 (two) times daily. 05/03/15   [name], [name] [name], MD  DROPSAFE SAFETY PEN NEEDLES 31G X 6 MM MISC USE FOR ONCE DAILY LANTUS ADMINISTRATION. 03/27/22   [name], NP  furosemide (LASIX) 20 MG tablet Take 20 mg by mouth daily.    [provider]  gabapentin (NEURONTIN) 100 MG capsule Take 100 mg by mouth at bedtime.    [provider]  gemfibrozil (LOPID) 600 MG tablet Take 1 tablet (600 mg total) by mouth 2 (two) times daily. 11/24/19   [name], Ankit Chirag, MD  glipiZIDE (GLUCOTROL XL) 5 MG 24 hr tablet TAKE (1) TABLET BY MOUTH ONCE DAILY WITH BREAKFAST. 12/20/21   [name], NP  glucose blood (EASYMAX TEST) test strip USE AS DIRECTED TO CHECK BLOOD SUGAR TWICE DAILY. 05/09/22   [name], NP  insulin glargine (LANTUS SOLOSTAR) 100 UNIT/ML Solostar Pen INJECT 60 UNITS SUBCUTANEOUSLY AT BEDTIME. (HOLD IF BS BELOW 60:CALL MD IF BS ABOVE 400) 05/17/22   [name], NP  isopropyl alcohol 70 % SOLN  11/21/20   [provider]  linagliptin (TRADJENTA) 5 MG TABS tablet Take 1 tablet (5 mg total) by mouth daily. 05/03/15   [name], [name], MD  lithium carbonate 300 MG capsule Take 300 mg by mouth in the morning and at bedtime.    [provider]  Melatonin 3 MG SUBL Take 1 tablet by mouth at bedtime. 04/22/20   [provider]  meloxicam (MOBIC) 7.5 MG tablet Take 7.5 mg by mouth daily. 05/12/21   [provider]  metFORMIN (GLUCOPHAGE) 1000 MG tablet Take 0.5 tablets (500 mg total) by mouth 2 (two) times daily with a  meal. 06/07/22   [name], NP  metoprolol tartrate (LOPRESSOR) 25 MG tablet Take 0.5 tablets (12.5 mg total) by mouth 2 (two) times daily. 05/03/15   [name], [name], MD  mirabegron ER (MYRBETRIQ) 25 MG TB24 tablet Take 1 tablet (25 mg total) by mouth daily. 02/29/20   [name], [name], MD  nitrofurantoin, macrocrystal-monohydrate, (MACROBID) 100 MG capsule Take 100 mg by mouth 2 (two) times daily. Patient not taking: Reported on 06/07/2022    [provider]  phenazopyridine (PYRIDIUM) 200 MG tablet Take 1 tablet (200 mg total) by mouth 3 (three) times daily. Patient not taking: Reported on 06/07/2022 11/24/21   Hendricks Limes,  PA-C  potassium chloride SA (KLOR-CON) 20 MEQ tablet Take 20 mEq by mouth daily.    [provider]  risperiDONE (RISPERDAL) 2 MG tablet Take 1 tablet (2 mg total) by mouth 2 (two) times daily. Patient taking differently: Take 2 mg by mouth daily. 05/03/15   [name], [name] [name], MD  SAFE TUSSIN DM 100-10 MG/5ML liquid as needed. 12/20/20   [provider]  tamsulosin (FLOMAX) 0.4 MG CAPS capsule Take 1 capsule (0.4 mg total) by mouth in the morning and at bedtime. 04/21/20   [name], [name], MD  tiotropium (SPIRIVA) 18 MCG inhalation capsule Place 1 capsule (18 mcg total) into inhaler and inhale daily. 05/03/15   [name], [name], MD  traZODone (DESYREL) 50 MG tablet Take 50 mg by mouth at bedtime.    [provider]      Allergies    Patient has no known allergies.    Review of Systems   Review of Systems  Constitutional:  Negative for appetite change and fatigue.  HENT:  Negative for congestion, ear discharge and sinus pressure.   Eyes:  Negative for discharge.  Respiratory:  Negative for cough.   Cardiovascular:  Negative for chest pain.  Gastrointestinal:  Positive for abdominal pain. Negative for diarrhea.  Genitourinary:  Positive for dysuria. Negative for frequency and hematuria.  Musculoskeletal:  Negative for back pain.  Skin:  Negative for rash.  Neurological:  Negative for seizures and headaches.  Psychiatric/Behavioral:  Negative for hallucinations.     Physical Exam Updated Vital Signs BP (!) 155/83   Pulse 95   Temp 98.4 F (36.9 C) (Oral)   Resp 20   SpO2 96%  Physical Exam Vitals and nursing note reviewed.  Constitutional:      Appearance: He is well-developed.  HENT:     Head: Normocephalic.     Nose: Nose normal.  Eyes:     General: No scleral icterus.    Conjunctiva/sclera: Conjunctivae normal.  Neck:     Thyroid: No thyromegaly.  Cardiovascular:     Rate and Rhythm: Normal rate and regular rhythm.     Heart  sounds: No murmur heard.    No friction rub. No gallop.  Pulmonary:     Breath sounds: No stridor. No wheezing or rales.  Chest:     Chest wall: No tenderness.  Abdominal:     General: There is distension.     Tenderness: There is abdominal tenderness. There is no rebound.  Musculoskeletal:        General: Normal range of motion.     Cervical back: Neck supple.  Lymphadenopathy:     Cervical: No cervical adenopathy.  Skin:    Findings: No erythema or rash.  Neurological:     Mental Status: He is alert and oriented to person,  place, and time.     Motor: No abnormal muscle tone.     Coordination: Coordination normal.  Psychiatric:        Behavior: Behavior normal.     ED Results / Procedures / Treatments   Labs (all labs ordered are listed, but only abnormal results are displayed) Labs Reviewed  URINALYSIS, ROUTINE W REFLEX MICROSCOPIC - Abnormal; Notable for the following components:      Result Value   Specific Gravity, Urine <1.005 (*)    Protein, ur 30 (*)    All other components within normal limits  URINALYSIS, MICROSCOPIC (REFLEX) - Abnormal; Notable for the following components:   Bacteria, UA RARE (*)    All other components within normal limits  URINE CULTURE  CBG MONITORING, ED    EKG None  Radiology No results found.  Procedures Procedures    Medications Ordered in ED Medications - No data to display  ED Course/ Medical Decision Making/ A&P                             Medical Decision Making Amount and/or Complexity of Data Reviewed Labs: ordered.    This patient presents to the ED for concern of urinary retention, this involves an extensive number of treatment options, and is a complaint that carries with it a high risk of complications and morbidity.  The differential diagnosis includes enlarged prostate   Co morbidities that complicate the patient evaluation  COPD and hypertension   Additional history obtained:  Additional history  obtained from friend External records from outside source obtained and reviewed including hospital records   Lab Tests:  I Ordered, and personally interpreted labs.  The pertinent results include: UA unremarkable   Imaging Studies ordered:  I ordered imaging studies including bladder scan I independently visualized and interpreted imaging which showed over 1500 cc of bladder I agree with the radiologist interpretation   Cardiac Monitoring: / EKG:  The patient was maintained on a cardiac monitor.  I personally viewed and interpreted the cardiac monitored which showed an underlying rhythm of: Normal sinus rhythm   Consultations Obtained:  No consultant  Problem List / ED Course / Critical interventions / Medication management  Urinary retention No medicines given Reevaluation of the patient after these medicines showed that the patient improved I have reviewed the patients home medicines and have made adjustments as needed   Social Determinants of Health:  None   Test / Admission - Considered:  None   Patient with urinary retention.  He will be sent home with a Foley and follow-up with urology        Final Clinical Impression(s) / ED Diagnoses Final diagnoses:  None    Rx / DC Orders ED Discharge Orders     None         Milton Ferguson, MD 06/22/22 1811

## 2022-06-22 NOTE — Discharge Instructions (Signed)
Follow up with [facility] next week °

## 2022-06-23 LAB — URINE CULTURE: Culture: NO GROWTH

## 2022-06-26 ENCOUNTER — Telehealth: Payer: Self-pay

## 2022-06-26 DIAGNOSIS — N400 Enlarged prostate without lower urinary tract symptoms: Secondary | ICD-10-CM | POA: Diagnosis not present

## 2022-06-26 DIAGNOSIS — Z960 Presence of urogenital implants: Secondary | ICD-10-CM | POA: Diagnosis not present

## 2022-06-26 NOTE — Telephone Encounter (Signed)
Patient seen Scott [name] in 2022 and Scott [name] last year. He has a catheter placed due to urinary retention. He wished to f/u with [facility] but Home Health is requesting an order for a Home Health Nurse to evaluate/treatment for catheter care.

## 2022-06-28 DIAGNOSIS — Z466 Encounter for fitting and adjustment of urinary device: Secondary | ICD-10-CM | POA: Diagnosis not present

## 2022-06-28 DIAGNOSIS — E1151 Type 2 diabetes mellitus with diabetic peripheral angiopathy without gangrene: Secondary | ICD-10-CM | POA: Diagnosis not present

## 2022-06-28 DIAGNOSIS — Z87820 Personal history of traumatic brain injury: Secondary | ICD-10-CM | POA: Diagnosis not present

## 2022-06-28 DIAGNOSIS — N400 Enlarged prostate without lower urinary tract symptoms: Secondary | ICD-10-CM | POA: Diagnosis not present

## 2022-06-28 DIAGNOSIS — N390 Urinary tract infection, site not specified: Secondary | ICD-10-CM | POA: Diagnosis not present

## 2022-06-28 DIAGNOSIS — E1142 Type 2 diabetes mellitus with diabetic polyneuropathy: Secondary | ICD-10-CM | POA: Diagnosis not present

## 2022-06-28 DIAGNOSIS — Z79899 Other long term (current) drug therapy: Secondary | ICD-10-CM | POA: Diagnosis not present

## 2022-06-28 DIAGNOSIS — F319 Bipolar disorder, unspecified: Secondary | ICD-10-CM | POA: Diagnosis not present

## 2022-06-28 DIAGNOSIS — F5101 Primary insomnia: Secondary | ICD-10-CM | POA: Diagnosis not present

## 2022-06-28 DIAGNOSIS — F3163 Bipolar disorder, current episode mixed, severe, without psychotic features: Secondary | ICD-10-CM | POA: Diagnosis not present

## 2022-06-28 DIAGNOSIS — J449 Chronic obstructive pulmonary disease, unspecified: Secondary | ICD-10-CM | POA: Diagnosis not present

## 2022-06-28 DIAGNOSIS — Z7984 Long term (current) use of oral hypoglycemic drugs: Secondary | ICD-10-CM | POA: Diagnosis not present

## 2022-06-28 DIAGNOSIS — Z794 Long term (current) use of insulin: Secondary | ICD-10-CM | POA: Diagnosis not present

## 2022-06-28 DIAGNOSIS — I10 Essential (primary) hypertension: Secondary | ICD-10-CM | POA: Diagnosis not present

## 2022-06-29 NOTE — Telephone Encounter (Signed)
Patient last seen by you in 04/2020 and seen Sharee Pimple 11/2021. Patient has appt with Dr. Junious Silk 04/01, are you willing to give verbal for catheter change to home health.

## 2022-07-03 DIAGNOSIS — Z794 Long term (current) use of insulin: Secondary | ICD-10-CM | POA: Diagnosis not present

## 2022-07-03 DIAGNOSIS — N400 Enlarged prostate without lower urinary tract symptoms: Secondary | ICD-10-CM | POA: Diagnosis not present

## 2022-07-03 DIAGNOSIS — E1142 Type 2 diabetes mellitus with diabetic polyneuropathy: Secondary | ICD-10-CM | POA: Diagnosis not present

## 2022-07-03 DIAGNOSIS — J449 Chronic obstructive pulmonary disease, unspecified: Secondary | ICD-10-CM | POA: Diagnosis not present

## 2022-07-03 DIAGNOSIS — F3163 Bipolar disorder, current episode mixed, severe, without psychotic features: Secondary | ICD-10-CM | POA: Diagnosis not present

## 2022-07-03 DIAGNOSIS — E1151 Type 2 diabetes mellitus with diabetic peripheral angiopathy without gangrene: Secondary | ICD-10-CM | POA: Diagnosis not present

## 2022-07-03 NOTE — Telephone Encounter (Signed)
Judson Roch from [facility] called with no answer.  Message left to return call to office.

## 2022-07-06 DIAGNOSIS — J449 Chronic obstructive pulmonary disease, unspecified: Secondary | ICD-10-CM | POA: Diagnosis not present

## 2022-07-06 DIAGNOSIS — Z794 Long term (current) use of insulin: Secondary | ICD-10-CM | POA: Diagnosis not present

## 2022-07-06 DIAGNOSIS — E1142 Type 2 diabetes mellitus with diabetic polyneuropathy: Secondary | ICD-10-CM | POA: Diagnosis not present

## 2022-07-06 DIAGNOSIS — F3163 Bipolar disorder, current episode mixed, severe, without psychotic features: Secondary | ICD-10-CM | POA: Diagnosis not present

## 2022-07-06 DIAGNOSIS — E1151 Type 2 diabetes mellitus with diabetic peripheral angiopathy without gangrene: Secondary | ICD-10-CM | POA: Diagnosis not present

## 2022-07-06 DIAGNOSIS — N400 Enlarged prostate without lower urinary tract symptoms: Secondary | ICD-10-CM | POA: Diagnosis not present

## 2022-07-09 ENCOUNTER — Encounter: Payer: Self-pay | Admitting: Urology

## 2022-07-09 ENCOUNTER — Ambulatory Visit (INDEPENDENT_AMBULATORY_CARE_PROVIDER_SITE_OTHER): Payer: Medicare Other | Admitting: Urology

## 2022-07-09 VITALS — BP 152/82 | HR 94

## 2022-07-09 DIAGNOSIS — R3129 Other microscopic hematuria: Secondary | ICD-10-CM

## 2022-07-09 DIAGNOSIS — R339 Retention of urine, unspecified: Secondary | ICD-10-CM | POA: Diagnosis not present

## 2022-07-09 DIAGNOSIS — N401 Enlarged prostate with lower urinary tract symptoms: Secondary | ICD-10-CM

## 2022-07-09 MED ORDER — CIPROFLOXACIN HCL 500 MG PO TABS
500.0000 mg | ORAL_TABLET | Freq: Once | ORAL | Status: AC
  Administered 2022-07-09: 500 mg via ORAL
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
## 2022-07-09 NOTE — Progress Notes (Signed)
Fill and Pull Catheter Removal  Patient is present today for a catheter removal.  Patient was cleaned and prepped in a sterile fashion 364ml of sterile water/ saline was instilled into the bladder when the patient felt the urge to urinate. 56ml of water was then drained from the balloon.  A 16FR coude foley cath was removed from the bladder no complications were noted .  Patient as then given some time to void on their own.  Patient can void  153ml on their own after some time.  Patient tolerated well.  Performed by: Shadie Sweatman LPN  Follow up/ Additional notes: Per MD note

## 2022-07-09 NOTE — Telephone Encounter (Signed)
Patient seen in office today. 

## 2022-07-10 DIAGNOSIS — E1142 Type 2 diabetes mellitus with diabetic polyneuropathy: Secondary | ICD-10-CM | POA: Diagnosis not present

## 2022-07-10 DIAGNOSIS — Z794 Long term (current) use of insulin: Secondary | ICD-10-CM | POA: Diagnosis not present

## 2022-07-10 DIAGNOSIS — E1151 Type 2 diabetes mellitus with diabetic peripheral angiopathy without gangrene: Secondary | ICD-10-CM | POA: Diagnosis not present

## 2022-07-10 DIAGNOSIS — F3163 Bipolar disorder, current episode mixed, severe, without psychotic features: Secondary | ICD-10-CM | POA: Diagnosis not present

## 2022-07-10 DIAGNOSIS — J449 Chronic obstructive pulmonary disease, unspecified: Secondary | ICD-10-CM | POA: Diagnosis not present

## 2022-07-10 DIAGNOSIS — N400 Enlarged prostate without lower urinary tract symptoms: Secondary | ICD-10-CM | POA: Diagnosis not present

## 2022-07-17 ENCOUNTER — Other Ambulatory Visit: Payer: Self-pay | Admitting: Nurse Practitioner

## 2022-07-19 DIAGNOSIS — E1151 Type 2 diabetes mellitus with diabetic peripheral angiopathy without gangrene: Secondary | ICD-10-CM | POA: Diagnosis not present

## 2022-07-19 DIAGNOSIS — E1142 Type 2 diabetes mellitus with diabetic polyneuropathy: Secondary | ICD-10-CM | POA: Diagnosis not present

## 2022-07-19 DIAGNOSIS — Z794 Long term (current) use of insulin: Secondary | ICD-10-CM | POA: Diagnosis not present

## 2022-07-19 DIAGNOSIS — N400 Enlarged prostate without lower urinary tract symptoms: Secondary | ICD-10-CM | POA: Diagnosis not present

## 2022-07-19 DIAGNOSIS — J449 Chronic obstructive pulmonary disease, unspecified: Secondary | ICD-10-CM | POA: Diagnosis not present

## 2022-07-19 DIAGNOSIS — F3163 Bipolar disorder, current episode mixed, severe, without psychotic features: Secondary | ICD-10-CM | POA: Diagnosis not present

## 2022-07-23 ENCOUNTER — Ambulatory Visit (HOSPITAL_COMMUNITY)
Admission: RE | Admit: 2022-07-23 | Discharge: 2022-07-23 | Disposition: A | Discharge status: Home / Self Care | Payer: Medicare Other | Source: Ambulatory Visit | Attending: Urology | Admitting: Urology

## 2022-07-23 DIAGNOSIS — N138 Other obstructive and reflux uropathy: Secondary | ICD-10-CM | POA: Diagnosis not present

## 2022-07-23 DIAGNOSIS — N401 Enlarged prostate with lower urinary tract symptoms: Secondary | ICD-10-CM | POA: Diagnosis not present

## 2022-07-26 DIAGNOSIS — Z87820 Personal history of traumatic brain injury: Secondary | ICD-10-CM | POA: Diagnosis not present

## 2022-07-26 DIAGNOSIS — L110 Acquired keratosis follicularis: Secondary | ICD-10-CM | POA: Diagnosis not present

## 2022-07-26 DIAGNOSIS — E1140 Type 2 diabetes mellitus with diabetic neuropathy, unspecified: Secondary | ICD-10-CM | POA: Diagnosis not present

## 2022-07-26 DIAGNOSIS — F5101 Primary insomnia: Secondary | ICD-10-CM | POA: Diagnosis not present

## 2022-07-26 DIAGNOSIS — L609 Nail disorder, unspecified: Secondary | ICD-10-CM | POA: Diagnosis not present

## 2022-07-26 DIAGNOSIS — E1142 Type 2 diabetes mellitus with diabetic polyneuropathy: Secondary | ICD-10-CM | POA: Diagnosis not present

## 2022-07-26 DIAGNOSIS — F319 Bipolar disorder, unspecified: Secondary | ICD-10-CM | POA: Diagnosis not present

## 2022-07-27 DIAGNOSIS — I10 Essential (primary) hypertension: Secondary | ICD-10-CM | POA: Diagnosis not present

## 2022-07-27 DIAGNOSIS — E1142 Type 2 diabetes mellitus with diabetic polyneuropathy: Secondary | ICD-10-CM | POA: Diagnosis not present

## 2022-07-28 DIAGNOSIS — Z79899 Other long term (current) drug therapy: Secondary | ICD-10-CM | POA: Diagnosis not present

## 2022-07-28 DIAGNOSIS — N400 Enlarged prostate without lower urinary tract symptoms: Secondary | ICD-10-CM | POA: Diagnosis not present

## 2022-07-28 DIAGNOSIS — Z466 Encounter for fitting and adjustment of urinary device: Secondary | ICD-10-CM | POA: Diagnosis not present

## 2022-07-28 DIAGNOSIS — I10 Essential (primary) hypertension: Secondary | ICD-10-CM | POA: Diagnosis not present

## 2022-07-28 DIAGNOSIS — E1151 Type 2 diabetes mellitus with diabetic peripheral angiopathy without gangrene: Secondary | ICD-10-CM | POA: Diagnosis not present

## 2022-07-28 DIAGNOSIS — E1142 Type 2 diabetes mellitus with diabetic polyneuropathy: Secondary | ICD-10-CM | POA: Diagnosis not present

## 2022-07-28 DIAGNOSIS — J449 Chronic obstructive pulmonary disease, unspecified: Secondary | ICD-10-CM | POA: Diagnosis not present

## 2022-07-28 DIAGNOSIS — Z794 Long term (current) use of insulin: Secondary | ICD-10-CM | POA: Diagnosis not present

## 2022-07-28 DIAGNOSIS — N390 Urinary tract infection, site not specified: Secondary | ICD-10-CM | POA: Diagnosis not present

## 2022-07-28 DIAGNOSIS — Z7984 Long term (current) use of oral hypoglycemic drugs: Secondary | ICD-10-CM | POA: Diagnosis not present

## 2022-07-28 DIAGNOSIS — F3163 Bipolar disorder, current episode mixed, severe, without psychotic features: Secondary | ICD-10-CM | POA: Diagnosis not present

## 2022-07-30 ENCOUNTER — Encounter: Payer: Self-pay | Admitting: Urology

## 2022-07-30 ENCOUNTER — Ambulatory Visit (INDEPENDENT_AMBULATORY_CARE_PROVIDER_SITE_OTHER): Payer: Medicare Other | Admitting: Urology

## 2022-07-30 VITALS — BP 151/95 | HR 75

## 2022-07-30 DIAGNOSIS — R339 Retention of urine, unspecified: Secondary | ICD-10-CM

## 2022-07-30 DIAGNOSIS — N138 Other obstructive and reflux uropathy: Secondary | ICD-10-CM

## 2022-07-30 DIAGNOSIS — N401 Enlarged prostate with lower urinary tract symptoms: Secondary | ICD-10-CM

## 2022-07-30 LAB — BLADDER SCAN AMB NON-IMAGING: Scan Result: 290

## 2022-07-30 LAB — URINALYSIS, ROUTINE W REFLEX MICROSCOPIC
Bilirubin, UA: NEGATIVE
Glucose, UA: NEGATIVE
Ketones, UA: NEGATIVE
Leukocytes,UA: NEGATIVE
Nitrite, UA: NEGATIVE
RBC, UA: NEGATIVE
Specific Gravity, UA: 1.01 (ref 1.005–1.030)
Urobilinogen, Ur: 0.2 mg/dL (ref 0.2–1.0)
pH, UA: 6.5 (ref 5.0–7.5)

## 2022-07-30 LAB — MICROSCOPIC EXAMINATION: Bacteria, UA: NONE SEEN

## 2022-07-30 MED ORDER — SILODOSIN 8 MG PO CAPS: 8.0000 mg | ORAL_CAPSULE | Freq: Every day | ORAL | 11 refills | Status: DC
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
## 2022-07-30 NOTE — Progress Notes (Signed)
post void residual=290

## 2022-07-30 NOTE — Patient Instructions (Signed)

## 2022-07-31 DIAGNOSIS — D0471 Carcinoma in situ of skin of right lower limb, including hip: Secondary | ICD-10-CM | POA: Diagnosis not present

## 2022-07-31 DIAGNOSIS — Z08 Encounter for follow-up examination after completed treatment for malignant neoplasm: Secondary | ICD-10-CM | POA: Diagnosis not present

## 2022-07-31 DIAGNOSIS — Z1283 Encounter for screening for malignant neoplasm of skin: Secondary | ICD-10-CM | POA: Diagnosis not present

## 2022-07-31 DIAGNOSIS — D225 Melanocytic nevi of trunk: Secondary | ICD-10-CM | POA: Diagnosis not present

## 2022-07-31 DIAGNOSIS — C44619 Basal cell carcinoma of skin of left upper limb, including shoulder: Secondary | ICD-10-CM | POA: Diagnosis not present

## 2022-07-31 DIAGNOSIS — Z85820 Personal history of malignant melanoma of skin: Secondary | ICD-10-CM | POA: Diagnosis not present

## 2022-08-16 DIAGNOSIS — E1151 Type 2 diabetes mellitus with diabetic peripheral angiopathy without gangrene: Secondary | ICD-10-CM | POA: Diagnosis not present

## 2022-08-16 DIAGNOSIS — Z794 Long term (current) use of insulin: Secondary | ICD-10-CM | POA: Diagnosis not present

## 2022-08-16 DIAGNOSIS — N400 Enlarged prostate without lower urinary tract symptoms: Secondary | ICD-10-CM | POA: Diagnosis not present

## 2022-08-16 DIAGNOSIS — J449 Chronic obstructive pulmonary disease, unspecified: Secondary | ICD-10-CM | POA: Diagnosis not present

## 2022-08-16 DIAGNOSIS — F3163 Bipolar disorder, current episode mixed, severe, without psychotic features: Secondary | ICD-10-CM | POA: Diagnosis not present

## 2022-08-16 DIAGNOSIS — E1142 Type 2 diabetes mellitus with diabetic polyneuropathy: Secondary | ICD-10-CM | POA: Diagnosis not present

## 2022-08-17 DIAGNOSIS — F319 Bipolar disorder, unspecified: Secondary | ICD-10-CM | POA: Diagnosis not present

## 2022-08-17 DIAGNOSIS — F5101 Primary insomnia: Secondary | ICD-10-CM | POA: Diagnosis not present

## 2022-08-17 DIAGNOSIS — Z87820 Personal history of traumatic brain injury: Secondary | ICD-10-CM | POA: Diagnosis not present

## 2022-08-23 DIAGNOSIS — M25559 Pain in unspecified hip: Secondary | ICD-10-CM | POA: Diagnosis not present

## 2022-08-23 DIAGNOSIS — M25459 Effusion, unspecified hip: Secondary | ICD-10-CM | POA: Diagnosis not present

## 2022-08-23 DIAGNOSIS — R29898 Other symptoms and signs involving the musculoskeletal system: Secondary | ICD-10-CM | POA: Diagnosis not present

## 2022-08-23 DIAGNOSIS — M7061 Trochanteric bursitis, right hip: Secondary | ICD-10-CM | POA: Diagnosis not present

## 2022-08-24 ENCOUNTER — Other Ambulatory Visit: Payer: Self-pay | Admitting: Nurse Practitioner

## 2022-08-26 DIAGNOSIS — I10 Essential (primary) hypertension: Secondary | ICD-10-CM | POA: Diagnosis not present

## 2022-08-26 DIAGNOSIS — E1142 Type 2 diabetes mellitus with diabetic polyneuropathy: Secondary | ICD-10-CM | POA: Diagnosis not present

## 2022-08-30 ENCOUNTER — Ambulatory Visit (HOSPITAL_COMMUNITY)
Admission: RE | Admit: 2022-08-30 | Discharge: 2022-08-30 | Disposition: A | Discharge status: Home / Self Care | Payer: Medicare Other | Source: Ambulatory Visit | Attending: Gerontology | Admitting: Gerontology

## 2022-08-30 ENCOUNTER — Other Ambulatory Visit (HOSPITAL_COMMUNITY)
Admission: RE | Admit: 2022-08-30 | Discharge: 2022-08-30 | Disposition: A | Discharge status: Home / Self Care | Payer: Medicare Other | Source: Ambulatory Visit | Attending: Internal Medicine | Admitting: Internal Medicine

## 2022-08-30 ENCOUNTER — Other Ambulatory Visit (HOSPITAL_COMMUNITY): Payer: Self-pay | Admitting: Gerontology

## 2022-08-30 DIAGNOSIS — M25551 Pain in right hip: Secondary | ICD-10-CM

## 2022-08-30 DIAGNOSIS — M25851 Other specified joint disorders, right hip: Secondary | ICD-10-CM | POA: Diagnosis not present

## 2022-08-30 DIAGNOSIS — I10 Essential (primary) hypertension: Secondary | ICD-10-CM | POA: Insufficient documentation

## 2022-08-30 DIAGNOSIS — E1142 Type 2 diabetes mellitus with diabetic polyneuropathy: Secondary | ICD-10-CM | POA: Diagnosis not present

## 2022-08-30 LAB — BASIC METABOLIC PANEL
Anion gap: 10 (ref 5–15)
BUN: 32 mg/dL — ABNORMAL HIGH (ref 8–23)
CO2: 22 mmol/L (ref 22–32)
Calcium: 8.4 mg/dL — ABNORMAL LOW (ref 8.9–10.3)
Chloride: 105 mmol/L (ref 98–111)
Creatinine, Ser: 1.69 mg/dL — ABNORMAL HIGH (ref 0.61–1.24)
GFR, Estimated: 46 mL/min — ABNORMAL LOW (ref >60)
Glucose, Bld: 114 mg/dL — ABNORMAL HIGH (ref 70–99)
Potassium: 4.4 mmol/L (ref 3.5–5.1)
Sodium: 137 mmol/L (ref 135–145)

## 2022-09-04 DIAGNOSIS — J449 Chronic obstructive pulmonary disease, unspecified: Secondary | ICD-10-CM | POA: Diagnosis not present

## 2022-09-04 DIAGNOSIS — E1142 Type 2 diabetes mellitus with diabetic polyneuropathy: Secondary | ICD-10-CM | POA: Diagnosis not present

## 2022-09-04 DIAGNOSIS — E559 Vitamin D deficiency, unspecified: Secondary | ICD-10-CM | POA: Diagnosis not present

## 2022-09-04 DIAGNOSIS — F3163 Bipolar disorder, current episode mixed, severe, without psychotic features: Secondary | ICD-10-CM | POA: Diagnosis not present

## 2022-09-04 DIAGNOSIS — I10 Essential (primary) hypertension: Secondary | ICD-10-CM | POA: Diagnosis not present

## 2022-09-04 DIAGNOSIS — E1165 Type 2 diabetes mellitus with hyperglycemia: Secondary | ICD-10-CM | POA: Diagnosis not present

## 2022-09-05 LAB — COMPREHENSIVE METABOLIC PANEL
ALT: 32 IU/L (ref 0–44)
AST: 21 IU/L (ref 0–40)
Albumin/Globulin Ratio: 1.2 (ref 1.2–2.2)
Albumin: 3.7 g/dL — ABNORMAL LOW (ref 3.9–4.9)
Alkaline Phosphatase: 87 IU/L (ref 44–121)
BUN/Creatinine Ratio: 19 (ref 10–24)
BUN: 27 mg/dL (ref 8–27)
Bilirubin Total: 0.3 mg/dL (ref 0.0–1.2)
CO2: 18 mmol/L — ABNORMAL LOW (ref 20–29)
Calcium: 8.8 mg/dL (ref 8.6–10.2)
Chloride: 108 mmol/L — ABNORMAL HIGH (ref 96–106)
Creatinine, Ser: 1.4 mg/dL — ABNORMAL HIGH (ref 0.76–1.27)
Globulin, Total: 3.1 g/dL (ref 1.5–4.5)
Glucose: 141 mg/dL — ABNORMAL HIGH (ref 70–99)
Potassium: 5.1 mmol/L (ref 3.5–5.2)
Sodium: 139 mmol/L (ref 134–144)
Total Protein: 6.8 g/dL (ref 6.0–8.5)
eGFR: 57 mL/min/{1.73_m2} — ABNORMAL LOW (ref >59)

## 2022-09-05 LAB — VITAMIN D 25 HYDROXY (VIT D DEFICIENCY, FRACTURES): Vit D, 25-Hydroxy: 44.2 ng/mL (ref 30.0–100.0)

## 2022-09-05 LAB — T4, FREE: Free T4: 1.06 ng/dL (ref 0.82–1.77)

## 2022-09-05 LAB — LIPID PANEL
Chol/HDL Ratio: 2.2 ratio (ref 0.0–5.0)
Cholesterol, Total: 78 mg/dL — ABNORMAL LOW (ref 100–199)
HDL: 35 mg/dL — ABNORMAL LOW (ref >39)
LDL Chol Calc (NIH): 23 mg/dL (ref 0–99)
Triglycerides: 105 mg/dL (ref 0–149)
VLDL Cholesterol Cal: 20 mg/dL (ref 5–40)

## 2022-09-05 LAB — TSH: TSH: 2.01 u[IU]/mL (ref 0.450–4.500)

## 2022-09-11 ENCOUNTER — Encounter: Payer: Self-pay | Admitting: Nurse Practitioner

## 2022-09-11 ENCOUNTER — Ambulatory Visit (INDEPENDENT_AMBULATORY_CARE_PROVIDER_SITE_OTHER): Payer: Medicare Other | Admitting: Nurse Practitioner

## 2022-09-11 VITALS — BP 117/76 | HR 70 | Ht 69.0 in | Wt 222.8 lb

## 2022-09-11 DIAGNOSIS — Z7984 Long term (current) use of oral hypoglycemic drugs: Secondary | ICD-10-CM | POA: Diagnosis not present

## 2022-09-11 DIAGNOSIS — Z794 Long term (current) use of insulin: Secondary | ICD-10-CM

## 2022-09-11 DIAGNOSIS — I10 Essential (primary) hypertension: Secondary | ICD-10-CM | POA: Diagnosis not present

## 2022-09-11 DIAGNOSIS — E782 Mixed hyperlipidemia: Secondary | ICD-10-CM | POA: Diagnosis not present

## 2022-09-11 DIAGNOSIS — E1165 Type 2 diabetes mellitus with hyperglycemia: Secondary | ICD-10-CM | POA: Diagnosis not present

## 2022-09-11 DIAGNOSIS — E559 Vitamin D deficiency, unspecified: Secondary | ICD-10-CM

## 2022-09-11 LAB — POCT GLYCOSYLATED HEMOGLOBIN (HGB A1C): Hemoglobin A1C: 6.7 % — AB (ref 4.0–5.6)
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
## 2022-09-12 DIAGNOSIS — Z08 Encounter for follow-up examination after completed treatment for malignant neoplasm: Secondary | ICD-10-CM | POA: Diagnosis not present

## 2022-09-12 DIAGNOSIS — Z85828 Personal history of other malignant neoplasm of skin: Secondary | ICD-10-CM | POA: Diagnosis not present

## 2022-09-12 DIAGNOSIS — C44719 Basal cell carcinoma of skin of left lower limb, including hip: Secondary | ICD-10-CM | POA: Diagnosis not present

## 2022-09-13 DIAGNOSIS — F319 Bipolar disorder, unspecified: Secondary | ICD-10-CM | POA: Diagnosis not present

## 2022-09-13 DIAGNOSIS — Z87820 Personal history of traumatic brain injury: Secondary | ICD-10-CM | POA: Diagnosis not present

## 2022-09-13 DIAGNOSIS — F5101 Primary insomnia: Secondary | ICD-10-CM | POA: Diagnosis not present

## 2022-09-21 ENCOUNTER — Other Ambulatory Visit: Payer: Self-pay | Admitting: Nurse Practitioner

## 2022-10-04 DIAGNOSIS — E1142 Type 2 diabetes mellitus with diabetic polyneuropathy: Secondary | ICD-10-CM | POA: Diagnosis not present

## 2022-10-04 DIAGNOSIS — L89893 Pressure ulcer of other site, stage 3: Secondary | ICD-10-CM | POA: Diagnosis not present

## 2022-10-04 DIAGNOSIS — L110 Acquired keratosis follicularis: Secondary | ICD-10-CM | POA: Diagnosis not present

## 2022-10-04 DIAGNOSIS — E1140 Type 2 diabetes mellitus with diabetic neuropathy, unspecified: Secondary | ICD-10-CM | POA: Diagnosis not present

## 2022-10-04 DIAGNOSIS — M79672 Pain in left foot: Secondary | ICD-10-CM | POA: Diagnosis not present

## 2022-10-04 DIAGNOSIS — M79675 Pain in left toe(s): Secondary | ICD-10-CM | POA: Diagnosis not present

## 2022-10-05 DIAGNOSIS — E1142 Type 2 diabetes mellitus with diabetic polyneuropathy: Secondary | ICD-10-CM | POA: Diagnosis not present

## 2022-10-05 DIAGNOSIS — I10 Essential (primary) hypertension: Secondary | ICD-10-CM | POA: Diagnosis not present

## 2022-10-07 ENCOUNTER — Emergency Department (HOSPITAL_COMMUNITY): Payer: Medicare Other

## 2022-10-07 ENCOUNTER — Encounter (HOSPITAL_COMMUNITY): Payer: Self-pay

## 2022-10-07 ENCOUNTER — Other Ambulatory Visit: Payer: Self-pay

## 2022-10-07 ENCOUNTER — Emergency Department (HOSPITAL_COMMUNITY)
Admission: EM | Admit: 2022-10-07 | Discharge: 2022-10-07 | Disposition: A | Discharge status: Other Acute Inpatient Hospital | Payer: Medicare Other | Attending: Emergency Medicine | Admitting: Emergency Medicine

## 2022-10-07 DIAGNOSIS — J449 Chronic obstructive pulmonary disease, unspecified: Secondary | ICD-10-CM | POA: Insufficient documentation

## 2022-10-07 DIAGNOSIS — E119 Type 2 diabetes mellitus without complications: Secondary | ICD-10-CM | POA: Diagnosis not present

## 2022-10-07 DIAGNOSIS — Z7984 Long term (current) use of oral hypoglycemic drugs: Secondary | ICD-10-CM | POA: Diagnosis not present

## 2022-10-07 DIAGNOSIS — M25552 Pain in left hip: Secondary | ICD-10-CM | POA: Diagnosis not present

## 2022-10-07 DIAGNOSIS — Z79899 Other long term (current) drug therapy: Secondary | ICD-10-CM | POA: Insufficient documentation

## 2022-10-07 DIAGNOSIS — M25559 Pain in unspecified hip: Secondary | ICD-10-CM | POA: Diagnosis not present

## 2022-10-07 DIAGNOSIS — R6889 Other general symptoms and signs: Secondary | ICD-10-CM | POA: Diagnosis not present

## 2022-10-07 DIAGNOSIS — Z743 Need for continuous supervision: Secondary | ICD-10-CM | POA: Diagnosis not present

## 2022-10-07 MED ORDER — ACETAMINOPHEN 500 MG PO TABS
1000.0000 mg | ORAL_TABLET | Freq: Once | ORAL | Status: AC
  Administered 2022-10-07: 1000 mg via ORAL
  Filled 2022-10-07: qty 2

## 2022-10-07 NOTE — ED Provider Notes (Signed)
[place] [facility]GENC[facility] DE[facility]MENT AT [facility] Provider Note   CSN: [id number] Arrival date & time: 10/07/22  1259     History  Chief Complaint  Patient presents with   Hip Pain    Scott [name] is a 62 y.o. male history of bipolar 1, diabetes, COPD presented with left hip pain that is been present for the past 8 days.  Patient states that his pain began when he woke up 1 day and has been persistent since then.  Patient denies having any trauma or falls but states he has been able to walk without issue since then.  Patient states when he presses on his left hip this exacerbates the pain and that the pain does not radiate.  Patient dates has been able to have bowel movements and urinate without issue and denies any back pain with this or decrease sensation/motor skills.  Patient denies any fevers.  Patient has not tried any medications for his pain.  Patient denied abdominal pain, nausea/vomiting  Home Medications Prior to Admission medications   Medication Sig Start Date End Date Taking? Authorizing Provider  acetaminophen (TYLENOL) 325 MG tablet Take 650 mg by mouth every 6 (six) hours as needed.    [provider]  albuterol (VENTOLIN HFA) 108 (90 Base) MCG/ACT inhaler Inhale 2 puffs into the lungs every 6 (six) hours as needed for wheezing or shortness of breath.    [provider]  ALLERGY RELIEF 10 MG tablet Take 10 mg by mouth daily. 04/29/20   [provider]  amLODipine (NORVASC) 5 MG tablet Take 1 tablet (5 mg total) by mouth daily. 06/15/19   Erick Blinks, MD  atorvastatin (LIPITOR) 40 MG tablet Take 40 mg by mouth daily. 07/10/22   [provider]  benzonatate (TESSALON) 100 MG capsule Take 100 mg by mouth 3 (three) times daily. 03/12/22   [provider]  benztropine (COGENTIN) 1 MG tablet Take 1 mg by mouth 2 (two) times daily.  11/05/18   [provider]  cetirizine (ZYRTEC) 10 MG tablet Take by mouth. 07/10/22    [provider]  divalproex (DEPAKOTE) 500 MG DR tablet Take 1 tablet (500 mg total) by mouth 2 (two) times daily. Patient taking differently: Take 500-1,000 mg by mouth 2 (two) times daily. 05/03/15   [name], Ellin Goodie, MD  DROPSAFE SAFETY PEN NEEDLES 31G X 6 MM MISC USE FOR ONCE DAILY LANTUS ADMINISTRATION. 03/27/22   Dani Gobble, NP  furosemide (LASIX) 20 MG tablet Take 20 mg by mouth daily.    [provider]  gabapentin (NEURONTIN) 100 MG capsule Take 100 mg by mouth at bedtime.    [provider]  gemfibrozil (LOPID) 600 MG tablet Take 1 tablet (600 mg total) by mouth 2 (two) times daily. 11/24/19   [name], Ankit Chirag, MD  glipiZIDE (GLUCOTROL XL) 5 MG 24 hr tablet TAKE (1) TABLET BY MOUTH ONCE DAILY WITH BREAKFAST. 12/20/21   Dani Gobble, NP  glucose blood (EASYMAX TEST) test strip USE AS DIRECTED TO CHECK BLOOD SUGAR TWICE DAILY. 09/24/22   Dani Gobble, NP  Insulin Pen Needle (BD AUTOSHIELD DUO) 30G X 5 MM MISC USE FOR ONCE DAILY LANTUS ADMINISTRATION. 07/17/22   Dani Gobble, NP  isopropyl alcohol 70 % SOLN  11/21/20   [provider]  LANTUS SOLOSTAR 100 UNIT/ML Solostar Pen INJECT 60 UNITS SUBCUTANEOUSLY AT BEDTIME. (HOLD IF BS BELOW 60:CALL MD IF BS ABOVE 400) 08/27/22   Lurlean Leyden,  Whitney J, NP  linagliptin (TRADJENTA) 5 MG TABS tablet Take 1 tablet (5 mg total) by mouth daily. 05/03/15   [name], Braulio Conte B, MD  lithium carbonate 300 MG capsule Take 300 mg by mouth in the morning and at bedtime.    [provider]  Melatonin 3 MG SUBL Take 1 tablet by mouth at bedtime. 04/22/20   [provider]  meloxicam (MOBIC) 7.5 MG tablet Take 7.5 mg by mouth daily. 05/12/21   [provider]  metFORMIN (GLUCOPHAGE) 1000 MG tablet Take 0.5 tablets (500 mg total) by mouth 2 (two) times daily with a meal. 06/07/22   Dani Gobble, NP  metoprolol tartrate (LOPRESSOR) 25 MG tablet Take 0.5 tablets (12.5 mg total) by  mouth 2 (two) times daily. 05/03/15   [name], Braulio Conte B, MD  mirabegron ER (MYRBETRIQ) 25 MG TB24 tablet Take 1 tablet (25 mg total) by mouth daily. 02/29/20   [name], Mardene Celeste, MD  nitrofurantoin, macrocrystal-monohydrate, (MACROBID) 100 MG capsule Take 100 mg by mouth 2 (two) times daily.    [provider]  phenazopyridine (PYRIDIUM) 200 MG tablet Take 1 tablet (200 mg total) by mouth 3 (three) times daily. 11/24/21   Honor Loh M, PA-C  potassium chloride SA (KLOR-CON) 20 MEQ tablet Take 20 mEq by mouth daily.    [provider]  risperiDONE (RISPERDAL) 2 MG tablet Take 1 tablet (2 mg total) by mouth 2 (two) times daily. Patient taking differently: Take 2 mg by mouth daily. 05/03/15   [name], Ellin Goodie, MD  SAFE TUSSIN DM 100-10 MG/5ML liquid as needed. 12/20/20   [provider]  silodosin (RAPAFLO) 8 MG CAPS capsule Take 1 capsule (8 mg total) by mouth at bedtime. 07/30/22   [name], Mardene Celeste, MD  tamsulosin (FLOMAX) 0.4 MG CAPS capsule Take 1 capsule (0.4 mg total) by mouth in the morning and at bedtime. Patient not taking: Reported on 09/11/2022 04/21/20   Malen Gauze, MD  tiotropium (SPIRIVA) 18 MCG inhalation capsule Place 1 capsule (18 mcg total) into inhaler and inhale daily. 05/03/15   [name], Braulio Conte B, MD  traZODone (DESYREL) 50 MG tablet Take 50 mg by mouth at bedtime.    [provider]      Allergies    Patient has no known allergies.    Review of Systems   Review of Systems See HPI Physical Exam Updated Vital Signs BP (!) 148/73   Pulse 78   Temp 98.1 F (36.7 C) (Oral)   Resp 16   Ht 5\' 9"  (1.753 m)   Wt 99.8 kg   BMI 32.49 kg/m  Physical Exam Vitals reviewed.  Constitutional:      General: He is not in acute distress. HENT:     Head: Normocephalic and atraumatic.  Cardiovascular:     Rate and Rhythm: Normal rate and regular rhythm.     Pulses: Normal pulses.     Heart sounds: Normal heart sounds.      Comments: 2+ bilateral radial/dorsalis pedis pulses with regular rate Pulmonary:     Effort: Pulmonary effort is normal. No respiratory distress.  Abdominal:     Palpations: Abdomen is soft.     Tenderness: There is no abdominal tenderness. There is no guarding or rebound.  Musculoskeletal:        General: Normal range of motion.     Cervical back: Normal range of motion.     Comments: 5 out of 5 bilateral grip/leg extension strength Pelvis stable,  no abnormalities palpated No midline tenderness or abnormalities palpated  Skin:    General: Skin is warm and dry.     Capillary Refill: Capillary refill takes less than 2 seconds.     Comments: No overlying skin color changes  Neurological:     General: No focal deficit present.     Mental Status: He is alert and oriented to person, place, and time.     Comments: Sensation intact in all 4 limbs  Psychiatric:        Mood and Affect: Mood normal.     ED Results / Procedures / Treatments   Labs (all labs ordered are listed, but only abnormal results are displayed) Labs Reviewed - No data to display  EKG None  Radiology DG Pelvis 1-2 Views  Result Date: 10/07/2022 CLINICAL DATA:  Hip pain for 1 week, no known injury EXAM: PELVIS - 1-2 VIEW COMPARISON:  None Available. FINDINGS: There is no evidence of displaced pelvic fracture or diastasis. Joint space is preserved. No pelvic bone lesions are seen. IMPRESSION: No fracture or dislocation of the left hip or pelvis. Joint space is preserved. Electronically Signed   By: Jearld Lesch M.D.   On: 10/07/2022 13:46    Procedures Procedures    Medications Ordered in ED Medications  acetaminophen (TYLENOL) tablet 1,000 mg (has no administration in time range)    ED Course/ Medical Decision Making/ A&P                             Medical Decision Making Amount and/or Complexity of Data Reviewed Radiology: ordered.  Risk OTC drugs.   Sina Sloane Dirusso 62 y.o. presented today for  left hip pain. Working DDx that I considered at this time includes, but not limited to, arthritis, strain/sprain, fracture, dislocation, neurovascular compromise, ischemic limb, compartment syndrome.  R/o DDx: strain/sprain, fracture, dislocation, neurovascular compromise, ischemic limb, compartment syndrome: These are considered less likely due to history of present illness and physical exam findings  Review of prior external notes: 06/22/2022 ED  Unique Tests and My Interpretation:  Left hip x-ray: No acute changes  Discussion with Independent Historian: None  Discussion of Management of Tests: None  Risk: Medium: prescription drug management  Risk Stratification Score: None  Plan: On exam patient was in no acute distress stable vitals.  Patient's foot exam was reassuring.  When I palpated patient's left pelvis patient was nontender and no abnormalities were palpated.  Patient pelvis was stable as well.  The rest patient's physical exam was reassuring.  At this time I have low suspicion of any life-threatening diagnoses and this could possibly be arthritic related.  Patient had reassuring x-ray.  Patient was given Tylenol and encouraged to follow-up with his primary care provider to monitor his symptoms.  Patient was given return precautions. Patient stable for discharge at this time.  Patient verbalized understanding of plan.         Final Clinical Impression(s) / ED Diagnoses Final diagnoses:  Left hip pain    Rx / DC Orders ED Discharge Orders     None         Remi Deter 10/07/22 1434    Bethann Berkshire, MD 10/10/22 1112

## 2022-10-07 NOTE — ED Notes (Signed)
Called on call legal guardian for patient, notified of pending discharge.

## 2022-10-07 NOTE — ED Triage Notes (Signed)
Pt BIB [facility] from ALF ([facility]) C/O intermittent L hip pain X 1 week. Denies known injury.

## 2022-10-07 NOTE — Discharge Instructions (Addendum)
Please follow-up with your primary care provider, recent symptoms ER visit.  Today your x-ray was reassuring along with your physical exam and you may take Tylenol every 6 hours as needed for pain.  Please monitor your symptoms and if they change or worsen please return to ER.

## 2022-10-07 NOTE — ED Notes (Signed)
Called [facility], sending someone to transport patient back to facility in 15-20 minutes.

## 2022-10-07 NOTE — ED Notes (Signed)
Pt discharged with staff from facility to transport him back.

## 2022-10-09 DIAGNOSIS — F5101 Primary insomnia: Secondary | ICD-10-CM | POA: Diagnosis not present

## 2022-10-09 DIAGNOSIS — F319 Bipolar disorder, unspecified: Secondary | ICD-10-CM | POA: Diagnosis not present

## 2022-10-09 DIAGNOSIS — M25552 Pain in left hip: Secondary | ICD-10-CM | POA: Diagnosis not present

## 2022-10-09 DIAGNOSIS — F3163 Bipolar disorder, current episode mixed, severe, without psychotic features: Secondary | ICD-10-CM | POA: Diagnosis not present

## 2022-10-09 DIAGNOSIS — I10 Essential (primary) hypertension: Secondary | ICD-10-CM | POA: Diagnosis not present

## 2022-10-09 DIAGNOSIS — E1142 Type 2 diabetes mellitus with diabetic polyneuropathy: Secondary | ICD-10-CM | POA: Diagnosis not present

## 2022-10-09 DIAGNOSIS — Z87820 Personal history of traumatic brain injury: Secondary | ICD-10-CM | POA: Diagnosis not present

## 2022-10-15 DIAGNOSIS — Z79899 Other long term (current) drug therapy: Secondary | ICD-10-CM | POA: Diagnosis not present

## 2022-10-15 DIAGNOSIS — F319 Bipolar disorder, unspecified: Secondary | ICD-10-CM | POA: Diagnosis not present

## 2022-10-15 DIAGNOSIS — Z5181 Encounter for therapeutic drug level monitoring: Secondary | ICD-10-CM | POA: Diagnosis not present

## 2022-10-19 ENCOUNTER — Other Ambulatory Visit: Payer: Self-pay | Admitting: Nurse Practitioner

## 2022-10-23 DIAGNOSIS — M79675 Pain in left toe(s): Secondary | ICD-10-CM | POA: Diagnosis not present

## 2022-10-23 DIAGNOSIS — E1140 Type 2 diabetes mellitus with diabetic neuropathy, unspecified: Secondary | ICD-10-CM | POA: Diagnosis not present

## 2022-10-23 DIAGNOSIS — M79672 Pain in left foot: Secondary | ICD-10-CM | POA: Diagnosis not present

## 2022-10-23 DIAGNOSIS — E1142 Type 2 diabetes mellitus with diabetic polyneuropathy: Secondary | ICD-10-CM | POA: Diagnosis not present

## 2022-10-23 DIAGNOSIS — L110 Acquired keratosis follicularis: Secondary | ICD-10-CM | POA: Diagnosis not present

## 2022-10-23 DIAGNOSIS — L89893 Pressure ulcer of other site, stage 3: Secondary | ICD-10-CM | POA: Diagnosis not present

## 2022-10-29 DIAGNOSIS — Z7984 Long term (current) use of oral hypoglycemic drugs: Secondary | ICD-10-CM | POA: Diagnosis not present

## 2022-10-29 DIAGNOSIS — E119 Type 2 diabetes mellitus without complications: Secondary | ICD-10-CM | POA: Diagnosis not present

## 2022-10-29 DIAGNOSIS — H25813 Combined forms of age-related cataract, bilateral: Secondary | ICD-10-CM | POA: Diagnosis not present

## 2022-10-31 ENCOUNTER — Ambulatory Visit: Payer: Medicare Other | Admitting: Urology

## 2022-10-31 DIAGNOSIS — E1142 Type 2 diabetes mellitus with diabetic polyneuropathy: Secondary | ICD-10-CM | POA: Diagnosis not present

## 2022-10-31 DIAGNOSIS — I10 Essential (primary) hypertension: Secondary | ICD-10-CM | POA: Diagnosis not present

## 2022-10-31 DIAGNOSIS — N401 Enlarged prostate with lower urinary tract symptoms: Secondary | ICD-10-CM

## 2022-10-31 DIAGNOSIS — R339 Retention of urine, unspecified: Secondary | ICD-10-CM

## 2022-10-31 DIAGNOSIS — J029 Acute pharyngitis, unspecified: Secondary | ICD-10-CM | POA: Diagnosis not present

## 2022-11-08 DIAGNOSIS — F5101 Primary insomnia: Secondary | ICD-10-CM | POA: Diagnosis not present

## 2022-11-08 DIAGNOSIS — Z87820 Personal history of traumatic brain injury: Secondary | ICD-10-CM | POA: Diagnosis not present

## 2022-11-08 DIAGNOSIS — F319 Bipolar disorder, unspecified: Secondary | ICD-10-CM | POA: Diagnosis not present

## 2022-11-12 DIAGNOSIS — L110 Acquired keratosis follicularis: Secondary | ICD-10-CM | POA: Diagnosis not present

## 2022-11-12 DIAGNOSIS — E1142 Type 2 diabetes mellitus with diabetic polyneuropathy: Secondary | ICD-10-CM | POA: Diagnosis not present

## 2022-11-12 DIAGNOSIS — M79674 Pain in right toe(s): Secondary | ICD-10-CM | POA: Diagnosis not present

## 2022-11-12 DIAGNOSIS — L89893 Pressure ulcer of other site, stage 3: Secondary | ICD-10-CM | POA: Diagnosis not present

## 2022-11-12 DIAGNOSIS — E1140 Type 2 diabetes mellitus with diabetic neuropathy, unspecified: Secondary | ICD-10-CM | POA: Diagnosis not present

## 2022-11-12 DIAGNOSIS — M79671 Pain in right foot: Secondary | ICD-10-CM | POA: Diagnosis not present

## 2022-11-28 DIAGNOSIS — Z85820 Personal history of malignant melanoma of skin: Secondary | ICD-10-CM | POA: Diagnosis not present

## 2022-11-28 DIAGNOSIS — Z1283 Encounter for screening for malignant neoplasm of skin: Secondary | ICD-10-CM | POA: Diagnosis not present

## 2022-11-28 DIAGNOSIS — Z08 Encounter for follow-up examination after completed treatment for malignant neoplasm: Secondary | ICD-10-CM | POA: Diagnosis not present

## 2022-11-28 DIAGNOSIS — Z85828 Personal history of other malignant neoplasm of skin: Secondary | ICD-10-CM | POA: Diagnosis not present

## 2022-12-01 DIAGNOSIS — E1142 Type 2 diabetes mellitus with diabetic polyneuropathy: Secondary | ICD-10-CM | POA: Diagnosis not present

## 2022-12-01 DIAGNOSIS — I10 Essential (primary) hypertension: Secondary | ICD-10-CM | POA: Diagnosis not present

## 2022-12-04 DIAGNOSIS — L89893 Pressure ulcer of other site, stage 3: Secondary | ICD-10-CM | POA: Diagnosis not present

## 2022-12-04 DIAGNOSIS — L110 Acquired keratosis follicularis: Secondary | ICD-10-CM | POA: Diagnosis not present

## 2022-12-04 DIAGNOSIS — M79674 Pain in right toe(s): Secondary | ICD-10-CM | POA: Diagnosis not present

## 2022-12-04 DIAGNOSIS — M79671 Pain in right foot: Secondary | ICD-10-CM | POA: Diagnosis not present

## 2022-12-04 DIAGNOSIS — E1142 Type 2 diabetes mellitus with diabetic polyneuropathy: Secondary | ICD-10-CM | POA: Diagnosis not present

## 2022-12-04 DIAGNOSIS — E1140 Type 2 diabetes mellitus with diabetic neuropathy, unspecified: Secondary | ICD-10-CM | POA: Diagnosis not present

## 2022-12-05 ENCOUNTER — Ambulatory Visit (INDEPENDENT_AMBULATORY_CARE_PROVIDER_SITE_OTHER): Payer: Medicare Other | Admitting: Urology

## 2022-12-05 ENCOUNTER — Encounter: Payer: Self-pay | Admitting: Urology

## 2022-12-05 VITALS — BP 121/75 | HR 76

## 2022-12-05 DIAGNOSIS — R339 Retention of urine, unspecified: Secondary | ICD-10-CM | POA: Diagnosis not present

## 2022-12-05 DIAGNOSIS — N138 Other obstructive and reflux uropathy: Secondary | ICD-10-CM | POA: Diagnosis not present

## 2022-12-05 DIAGNOSIS — N401 Enlarged prostate with lower urinary tract symptoms: Secondary | ICD-10-CM

## 2022-12-05 LAB — URINALYSIS, ROUTINE W REFLEX MICROSCOPIC
Bilirubin, UA: NEGATIVE
Ketones, UA: NEGATIVE
Leukocytes,UA: NEGATIVE
Nitrite, UA: NEGATIVE
RBC, UA: NEGATIVE
Specific Gravity, UA: 1.01 (ref 1.005–1.030)
Urobilinogen, Ur: 0.2 mg/dL (ref 0.2–1.0)
pH, UA: 6.5 (ref 5.0–7.5)

## 2022-12-05 LAB — BLADDER SCAN AMB NON-IMAGING: Scan Result: 321

## 2022-12-05 NOTE — Progress Notes (Signed)
post void residual=321

## 2022-12-05 NOTE — Patient Instructions (Signed)
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
## 2022-12-06 DIAGNOSIS — Z87820 Personal history of traumatic brain injury: Secondary | ICD-10-CM | POA: Diagnosis not present

## 2022-12-06 DIAGNOSIS — F5101 Primary insomnia: Secondary | ICD-10-CM | POA: Diagnosis not present

## 2022-12-06 DIAGNOSIS — F319 Bipolar disorder, unspecified: Secondary | ICD-10-CM | POA: Diagnosis not present

## 2022-12-21 ENCOUNTER — Other Ambulatory Visit: Payer: Self-pay | Admitting: Nurse Practitioner

## 2022-12-25 DIAGNOSIS — M79672 Pain in left foot: Secondary | ICD-10-CM | POA: Diagnosis not present

## 2022-12-25 DIAGNOSIS — L89892 Pressure ulcer of other site, stage 2: Secondary | ICD-10-CM | POA: Diagnosis not present

## 2022-12-25 DIAGNOSIS — E1142 Type 2 diabetes mellitus with diabetic polyneuropathy: Secondary | ICD-10-CM | POA: Diagnosis not present

## 2022-12-25 DIAGNOSIS — M79675 Pain in left toe(s): Secondary | ICD-10-CM | POA: Diagnosis not present

## 2022-12-25 DIAGNOSIS — L110 Acquired keratosis follicularis: Secondary | ICD-10-CM | POA: Diagnosis not present

## 2022-12-25 DIAGNOSIS — E1140 Type 2 diabetes mellitus with diabetic neuropathy, unspecified: Secondary | ICD-10-CM | POA: Diagnosis not present

## 2023-01-01 DIAGNOSIS — I10 Essential (primary) hypertension: Secondary | ICD-10-CM | POA: Diagnosis not present

## 2023-01-01 DIAGNOSIS — E1142 Type 2 diabetes mellitus with diabetic polyneuropathy: Secondary | ICD-10-CM | POA: Diagnosis not present

## 2023-01-03 DIAGNOSIS — Z87820 Personal history of traumatic brain injury: Secondary | ICD-10-CM | POA: Diagnosis not present

## 2023-01-03 DIAGNOSIS — F319 Bipolar disorder, unspecified: Secondary | ICD-10-CM | POA: Diagnosis not present

## 2023-01-03 DIAGNOSIS — F5101 Primary insomnia: Secondary | ICD-10-CM | POA: Diagnosis not present

## 2023-01-08 DIAGNOSIS — M79674 Pain in right toe(s): Secondary | ICD-10-CM | POA: Diagnosis not present

## 2023-01-08 DIAGNOSIS — E1142 Type 2 diabetes mellitus with diabetic polyneuropathy: Secondary | ICD-10-CM | POA: Diagnosis not present

## 2023-01-08 DIAGNOSIS — E1140 Type 2 diabetes mellitus with diabetic neuropathy, unspecified: Secondary | ICD-10-CM | POA: Diagnosis not present

## 2023-01-08 DIAGNOSIS — L89893 Pressure ulcer of other site, stage 3: Secondary | ICD-10-CM | POA: Diagnosis not present

## 2023-01-08 DIAGNOSIS — M79671 Pain in right foot: Secondary | ICD-10-CM | POA: Diagnosis not present

## 2023-01-08 DIAGNOSIS — L110 Acquired keratosis follicularis: Secondary | ICD-10-CM | POA: Diagnosis not present

## 2023-01-09 DIAGNOSIS — Z23 Encounter for immunization: Secondary | ICD-10-CM | POA: Diagnosis not present

## 2023-01-13 ENCOUNTER — Emergency Department (HOSPITAL_COMMUNITY)
Admission: EM | Admit: 2023-01-13 | Discharge: 2023-01-14 | Payer: Medicare Other | Attending: Emergency Medicine | Admitting: Emergency Medicine

## 2023-01-13 DIAGNOSIS — R609 Edema, unspecified: Secondary | ICD-10-CM | POA: Diagnosis not present

## 2023-01-13 DIAGNOSIS — W19XXXA Unspecified fall, initial encounter: Secondary | ICD-10-CM

## 2023-01-13 DIAGNOSIS — E119 Type 2 diabetes mellitus without complications: Secondary | ICD-10-CM | POA: Insufficient documentation

## 2023-01-13 DIAGNOSIS — Y9222 Religious institution as the place of occurrence of the external cause: Secondary | ICD-10-CM | POA: Insufficient documentation

## 2023-01-13 DIAGNOSIS — W010XXA Fall on same level from slipping, tripping and stumbling without subsequent striking against object, initial encounter: Secondary | ICD-10-CM | POA: Insufficient documentation

## 2023-01-13 DIAGNOSIS — M25552 Pain in left hip: Secondary | ICD-10-CM | POA: Insufficient documentation

## 2023-01-13 DIAGNOSIS — S90112A Contusion of left great toe without damage to nail, initial encounter: Secondary | ICD-10-CM | POA: Insufficient documentation

## 2023-01-13 DIAGNOSIS — M160 Bilateral primary osteoarthritis of hip: Secondary | ICD-10-CM | POA: Diagnosis not present

## 2023-01-13 DIAGNOSIS — M79672 Pain in left foot: Secondary | ICD-10-CM | POA: Diagnosis present

## 2023-01-13 DIAGNOSIS — M19072 Primary osteoarthritis, left ankle and foot: Secondary | ICD-10-CM | POA: Diagnosis not present

## 2023-01-13 DIAGNOSIS — Z043 Encounter for examination and observation following other accident: Secondary | ICD-10-CM | POA: Diagnosis not present

## 2023-01-13 DIAGNOSIS — T148XXA Other injury of unspecified body region, initial encounter: Secondary | ICD-10-CM

## 2023-01-13 DIAGNOSIS — Z743 Need for continuous supervision: Secondary | ICD-10-CM | POA: Diagnosis not present

## 2023-01-13 DIAGNOSIS — S9032XA Contusion of left foot, initial encounter: Secondary | ICD-10-CM | POA: Diagnosis not present
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
## 2023-01-14 ENCOUNTER — Encounter: Payer: Self-pay | Admitting: Nurse Practitioner

## 2023-01-14 ENCOUNTER — Other Ambulatory Visit: Payer: Self-pay

## 2023-01-14 ENCOUNTER — Encounter (HOSPITAL_COMMUNITY): Payer: Self-pay | Admitting: Emergency Medicine

## 2023-01-14 ENCOUNTER — Emergency Department (HOSPITAL_COMMUNITY): Payer: Medicare Other

## 2023-01-14 ENCOUNTER — Ambulatory Visit (INDEPENDENT_AMBULATORY_CARE_PROVIDER_SITE_OTHER): Payer: Medicare Other | Admitting: Nurse Practitioner

## 2023-01-14 VITALS — BP 98/64 | HR 79 | Ht 69.0 in | Wt 226.0 lb

## 2023-01-14 DIAGNOSIS — Z794 Long term (current) use of insulin: Secondary | ICD-10-CM

## 2023-01-14 DIAGNOSIS — E782 Mixed hyperlipidemia: Secondary | ICD-10-CM | POA: Diagnosis not present

## 2023-01-14 DIAGNOSIS — E559 Vitamin D deficiency, unspecified: Secondary | ICD-10-CM | POA: Diagnosis not present

## 2023-01-14 DIAGNOSIS — S9032XA Contusion of left foot, initial encounter: Secondary | ICD-10-CM | POA: Diagnosis not present

## 2023-01-14 DIAGNOSIS — M160 Bilateral primary osteoarthritis of hip: Secondary | ICD-10-CM | POA: Diagnosis not present

## 2023-01-14 DIAGNOSIS — I10 Essential (primary) hypertension: Secondary | ICD-10-CM

## 2023-01-14 DIAGNOSIS — E1165 Type 2 diabetes mellitus with hyperglycemia: Secondary | ICD-10-CM | POA: Diagnosis not present

## 2023-01-14 DIAGNOSIS — Z043 Encounter for examination and observation following other accident: Secondary | ICD-10-CM | POA: Diagnosis not present

## 2023-01-14 DIAGNOSIS — Z7984 Long term (current) use of oral hypoglycemic drugs: Secondary | ICD-10-CM

## 2023-01-14 DIAGNOSIS — S90112A Contusion of left great toe without damage to nail, initial encounter: Secondary | ICD-10-CM | POA: Diagnosis not present

## 2023-01-14 DIAGNOSIS — M19072 Primary osteoarthritis, left ankle and foot: Secondary | ICD-10-CM | POA: Diagnosis not present

## 2023-01-14 LAB — POCT GLYCOSYLATED HEMOGLOBIN (HGB A1C): Hemoglobin A1C: 7.1 % — AB (ref 4.0–5.6)

## 2023-01-14 MED ORDER — OXYCODONE HCL 5 MG PO TABS
5.0000 mg | ORAL_TABLET | Freq: Once | ORAL | Status: AC
  Administered 2023-01-14: 5 mg via ORAL
  Filled 2023-01-14: qty 1

## 2023-01-14 NOTE — ED Notes (Signed)
Notified [facility] of patient needing transportation back to [facility].
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
## 2023-01-14 NOTE — ED Notes (Signed)
ED Provider at bedside. 

## 2023-01-14 NOTE — Discharge Instructions (Signed)
You were evaluated in the Emergency Department and after careful evaluation, we did not find any emergent condition requiring admission or further testing in the hospital.  Your exam/testing today is overall reassuring.  No broken bones.  Take Tylenol 1000 mg every 4-6 hours as needed for discomfort at home.  Please return to the Emergency Department if you experience any worsening of your condition.   Thank you for allowing [place] to be a part of your care.
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
## 2023-01-17 DIAGNOSIS — I10 Essential (primary) hypertension: Secondary | ICD-10-CM | POA: Diagnosis not present

## 2023-01-17 DIAGNOSIS — E1142 Type 2 diabetes mellitus with diabetic polyneuropathy: Secondary | ICD-10-CM | POA: Diagnosis not present

## 2023-01-17 DIAGNOSIS — M25851 Other specified joint disorders, right hip: Secondary | ICD-10-CM | POA: Diagnosis not present

## 2023-01-17 DIAGNOSIS — W19XXXD Unspecified fall, subsequent encounter: Secondary | ICD-10-CM | POA: Diagnosis not present

## 2023-01-21 DIAGNOSIS — E1140 Type 2 diabetes mellitus with diabetic neuropathy, unspecified: Secondary | ICD-10-CM | POA: Diagnosis not present

## 2023-01-21 DIAGNOSIS — L89893 Pressure ulcer of other site, stage 3: Secondary | ICD-10-CM | POA: Diagnosis not present

## 2023-01-21 DIAGNOSIS — L110 Acquired keratosis follicularis: Secondary | ICD-10-CM | POA: Diagnosis not present

## 2023-01-21 DIAGNOSIS — E1142 Type 2 diabetes mellitus with diabetic polyneuropathy: Secondary | ICD-10-CM | POA: Diagnosis not present

## 2023-01-21 DIAGNOSIS — M79671 Pain in right foot: Secondary | ICD-10-CM | POA: Diagnosis not present

## 2023-01-21 DIAGNOSIS — M79674 Pain in right toe(s): Secondary | ICD-10-CM | POA: Diagnosis not present

## 2023-02-04 DIAGNOSIS — L89893 Pressure ulcer of other site, stage 3: Secondary | ICD-10-CM | POA: Diagnosis not present

## 2023-02-04 DIAGNOSIS — E1142 Type 2 diabetes mellitus with diabetic polyneuropathy: Secondary | ICD-10-CM | POA: Diagnosis not present

## 2023-02-04 DIAGNOSIS — M79675 Pain in left toe(s): Secondary | ICD-10-CM | POA: Diagnosis not present

## 2023-02-04 DIAGNOSIS — M79672 Pain in left foot: Secondary | ICD-10-CM | POA: Diagnosis not present

## 2023-02-04 DIAGNOSIS — E1140 Type 2 diabetes mellitus with diabetic neuropathy, unspecified: Secondary | ICD-10-CM | POA: Diagnosis not present

## 2023-02-04 DIAGNOSIS — L110 Acquired keratosis follicularis: Secondary | ICD-10-CM | POA: Diagnosis not present

## 2023-02-07 DIAGNOSIS — F319 Bipolar disorder, unspecified: Secondary | ICD-10-CM | POA: Diagnosis not present

## 2023-02-07 DIAGNOSIS — Z87820 Personal history of traumatic brain injury: Secondary | ICD-10-CM | POA: Diagnosis not present

## 2023-02-07 DIAGNOSIS — F5101 Primary insomnia: Secondary | ICD-10-CM | POA: Diagnosis not present

## 2023-02-12 ENCOUNTER — Emergency Department (HOSPITAL_COMMUNITY): Payer: Medicare Other

## 2023-02-12 ENCOUNTER — Inpatient Hospital Stay (HOSPITAL_COMMUNITY): Payer: Medicare Other

## 2023-02-12 ENCOUNTER — Inpatient Hospital Stay (HOSPITAL_COMMUNITY)
Admission: EM | Admit: 2023-02-12 | Discharge: 2023-02-15 | DRG: 871 | Disposition: A | Discharge status: Skilled Nursing Facility | Payer: Medicare Other | Source: Skilled Nursing Facility | Attending: Family Medicine | Admitting: Family Medicine

## 2023-02-12 ENCOUNTER — Other Ambulatory Visit: Payer: Self-pay

## 2023-02-12 DIAGNOSIS — J439 Emphysema, unspecified: Secondary | ICD-10-CM | POA: Diagnosis not present

## 2023-02-12 DIAGNOSIS — N1831 Chronic kidney disease, stage 3a: Secondary | ICD-10-CM | POA: Diagnosis present

## 2023-02-12 DIAGNOSIS — G9341 Metabolic encephalopathy: Secondary | ICD-10-CM | POA: Diagnosis not present

## 2023-02-12 DIAGNOSIS — F70 Mild intellectual disabilities: Secondary | ICD-10-CM | POA: Diagnosis present

## 2023-02-12 DIAGNOSIS — E872 Acidosis, unspecified: Secondary | ICD-10-CM | POA: Insufficient documentation

## 2023-02-12 DIAGNOSIS — J181 Lobar pneumonia, unspecified organism: Secondary | ICD-10-CM | POA: Diagnosis present

## 2023-02-12 DIAGNOSIS — Z794 Long term (current) use of insulin: Secondary | ICD-10-CM | POA: Diagnosis not present

## 2023-02-12 DIAGNOSIS — Z79899 Other long term (current) drug therapy: Secondary | ICD-10-CM

## 2023-02-12 DIAGNOSIS — Z7984 Long term (current) use of oral hypoglycemic drugs: Secondary | ICD-10-CM

## 2023-02-12 DIAGNOSIS — Z6832 Body mass index (BMI) 32.0-32.9, adult: Secondary | ICD-10-CM

## 2023-02-12 DIAGNOSIS — E8809 Other disorders of plasma-protein metabolism, not elsewhere classified: Secondary | ICD-10-CM | POA: Diagnosis present

## 2023-02-12 DIAGNOSIS — E1165 Type 2 diabetes mellitus with hyperglycemia: Secondary | ICD-10-CM | POA: Diagnosis present

## 2023-02-12 DIAGNOSIS — R000 Tachycardia, unspecified: Secondary | ICD-10-CM | POA: Diagnosis not present

## 2023-02-12 DIAGNOSIS — M6281 Muscle weakness (generalized): Secondary | ICD-10-CM | POA: Diagnosis present

## 2023-02-12 DIAGNOSIS — J441 Chronic obstructive pulmonary disease with (acute) exacerbation: Secondary | ICD-10-CM | POA: Diagnosis present

## 2023-02-12 DIAGNOSIS — R279 Unspecified lack of coordination: Secondary | ICD-10-CM | POA: Diagnosis present

## 2023-02-12 DIAGNOSIS — R6520 Severe sepsis without septic shock: Secondary | ICD-10-CM | POA: Diagnosis present

## 2023-02-12 DIAGNOSIS — L97529 Non-pressure chronic ulcer of other part of left foot with unspecified severity: Secondary | ICD-10-CM | POA: Diagnosis present

## 2023-02-12 DIAGNOSIS — E11621 Type 2 diabetes mellitus with foot ulcer: Secondary | ICD-10-CM | POA: Diagnosis present

## 2023-02-12 DIAGNOSIS — R262 Difficulty in walking, not elsewhere classified: Secondary | ICD-10-CM | POA: Diagnosis present

## 2023-02-12 DIAGNOSIS — Z1152 Encounter for screening for COVID-19: Secondary | ICD-10-CM | POA: Diagnosis not present

## 2023-02-12 DIAGNOSIS — F319 Bipolar disorder, unspecified: Secondary | ICD-10-CM | POA: Insufficient documentation

## 2023-02-12 DIAGNOSIS — J9601 Acute respiratory failure with hypoxia: Secondary | ICD-10-CM | POA: Diagnosis present

## 2023-02-12 DIAGNOSIS — E782 Mixed hyperlipidemia: Secondary | ICD-10-CM | POA: Diagnosis not present

## 2023-02-12 DIAGNOSIS — R410 Disorientation, unspecified: Secondary | ICD-10-CM | POA: Diagnosis not present

## 2023-02-12 DIAGNOSIS — E669 Obesity, unspecified: Secondary | ICD-10-CM | POA: Insufficient documentation

## 2023-02-12 DIAGNOSIS — J440 Chronic obstructive pulmonary disease with acute lower respiratory infection: Secondary | ICD-10-CM | POA: Diagnosis not present

## 2023-02-12 DIAGNOSIS — E1122 Type 2 diabetes mellitus with diabetic chronic kidney disease: Secondary | ICD-10-CM | POA: Diagnosis not present

## 2023-02-12 DIAGNOSIS — E46 Unspecified protein-calorie malnutrition: Secondary | ICD-10-CM | POA: Diagnosis not present

## 2023-02-12 DIAGNOSIS — F79 Unspecified intellectual disabilities: Secondary | ICD-10-CM | POA: Diagnosis present

## 2023-02-12 DIAGNOSIS — J9811 Atelectasis: Secondary | ICD-10-CM | POA: Diagnosis present

## 2023-02-12 DIAGNOSIS — R918 Other nonspecific abnormal finding of lung field: Secondary | ICD-10-CM | POA: Diagnosis not present

## 2023-02-12 DIAGNOSIS — Z800 Family history of malignant neoplasm of digestive organs: Secondary | ICD-10-CM

## 2023-02-12 DIAGNOSIS — N179 Acute kidney failure, unspecified: Secondary | ICD-10-CM | POA: Diagnosis not present

## 2023-02-12 DIAGNOSIS — I499 Cardiac arrhythmia, unspecified: Secondary | ICD-10-CM | POA: Diagnosis not present

## 2023-02-12 DIAGNOSIS — L03116 Cellulitis of left lower limb: Secondary | ICD-10-CM | POA: Diagnosis not present

## 2023-02-12 DIAGNOSIS — Z743 Need for continuous supervision: Secondary | ICD-10-CM | POA: Diagnosis not present

## 2023-02-12 DIAGNOSIS — A419 Sepsis, unspecified organism: Principal | ICD-10-CM | POA: Diagnosis present

## 2023-02-12 DIAGNOSIS — I10 Essential (primary) hypertension: Secondary | ICD-10-CM | POA: Diagnosis present

## 2023-02-12 DIAGNOSIS — B348 Other viral infections of unspecified site: Secondary | ICD-10-CM | POA: Diagnosis present

## 2023-02-12 DIAGNOSIS — Z87891 Personal history of nicotine dependence: Secondary | ICD-10-CM

## 2023-02-12 DIAGNOSIS — Z8701 Personal history of pneumonia (recurrent): Secondary | ICD-10-CM

## 2023-02-12 DIAGNOSIS — R1312 Dysphagia, oropharyngeal phase: Secondary | ICD-10-CM | POA: Diagnosis present

## 2023-02-12 DIAGNOSIS — I129 Hypertensive chronic kidney disease with stage 1 through stage 4 chronic kidney disease, or unspecified chronic kidney disease: Secondary | ICD-10-CM | POA: Diagnosis present

## 2023-02-12 DIAGNOSIS — G9340 Encephalopathy, unspecified: Secondary | ICD-10-CM | POA: Diagnosis not present

## 2023-02-12 DIAGNOSIS — Z87820 Personal history of traumatic brain injury: Secondary | ICD-10-CM

## 2023-02-12 DIAGNOSIS — D631 Anemia in chronic kidney disease: Secondary | ICD-10-CM | POA: Diagnosis not present

## 2023-02-12 DIAGNOSIS — F329 Major depressive disorder, single episode, unspecified: Secondary | ICD-10-CM | POA: Diagnosis present

## 2023-02-12 DIAGNOSIS — J9620 Acute and chronic respiratory failure, unspecified whether with hypoxia or hypercapnia: Secondary | ICD-10-CM | POA: Diagnosis present

## 2023-02-12 DIAGNOSIS — R531 Weakness: Secondary | ICD-10-CM | POA: Diagnosis not present

## 2023-02-12 DIAGNOSIS — R770 Abnormality of albumin: Secondary | ICD-10-CM | POA: Diagnosis present

## 2023-02-12 DIAGNOSIS — R0902 Hypoxemia: Secondary | ICD-10-CM | POA: Diagnosis not present

## 2023-02-12 LAB — COMPREHENSIVE METABOLIC PANEL
ALT: 15 U/L (ref 0–44)
ALT: 14 U/L (ref 0–44)
AST: 14 U/L — ABNORMAL LOW (ref 15–41)
AST: 12 U/L — ABNORMAL LOW (ref 15–41)
Albumin: 2.9 g/dL — ABNORMAL LOW (ref 3.5–5.0)
Albumin: 2.7 g/dL — ABNORMAL LOW (ref 3.5–5.0)
Alkaline Phosphatase: 69 U/L (ref 38–126)
Alkaline Phosphatase: 59 U/L (ref 38–126)
Anion gap: 12 (ref 5–15)
Anion gap: 10 (ref 5–15)
BUN: 38 mg/dL — ABNORMAL HIGH (ref 8–23)
BUN: 33 mg/dL — ABNORMAL HIGH (ref 8–23)
CO2: 19 mmol/L — ABNORMAL LOW (ref 22–32)
CO2: 21 mmol/L — ABNORMAL LOW (ref 22–32)
Calcium: 9.2 mg/dL (ref 8.9–10.3)
Calcium: 8.9 mg/dL (ref 8.9–10.3)
Chloride: 101 mmol/L (ref 98–111)
Chloride: 104 mmol/L (ref 98–111)
Creatinine, Ser: 2.18 mg/dL — ABNORMAL HIGH (ref 0.61–1.24)
Creatinine, Ser: 1.93 mg/dL — ABNORMAL HIGH (ref 0.61–1.24)
GFR, Estimated: 34 mL/min — ABNORMAL LOW (ref >60)
GFR, Estimated: 39 mL/min — ABNORMAL LOW (ref >60)
Glucose, Bld: 249 mg/dL — ABNORMAL HIGH (ref 70–99)
Glucose, Bld: 210 mg/dL — ABNORMAL HIGH (ref 70–99)
Potassium: 4.5 mmol/L (ref 3.5–5.1)
Potassium: 4.1 mmol/L (ref 3.5–5.1)
Sodium: 132 mmol/L — ABNORMAL LOW (ref 135–145)
Sodium: 135 mmol/L (ref 135–145)
Total Bilirubin: 0.7 mg/dL (ref <1.2)
Total Bilirubin: 0.7 mg/dL (ref <1.2)
Total Protein: 7.4 g/dL (ref 6.5–8.1)
Total Protein: 6.7 g/dL (ref 6.5–8.1)

## 2023-02-12 LAB — PROTIME-INR
INR: 1.1 (ref 0.8–1.2)
Prothrombin Time: 14.1 s (ref 11.4–15.2)

## 2023-02-12 LAB — URINALYSIS, W/ REFLEX TO CULTURE (INFECTION SUSPECTED)
Bacteria, UA: NONE SEEN
Bilirubin Urine: NEGATIVE
Glucose, UA: 150 mg/dL — AB
Ketones, ur: NEGATIVE mg/dL
Leukocytes,Ua: NEGATIVE
Nitrite: NEGATIVE
Protein, ur: 100 mg/dL — AB
Specific Gravity, Urine: 1.01 (ref 1.005–1.030)
pH: 6 (ref 5.0–8.0)

## 2023-02-12 LAB — CBC
HCT: 35.9 % — ABNORMAL LOW (ref 39.0–52.0)
Hemoglobin: 11 g/dL — ABNORMAL LOW (ref 13.0–17.0)
MCH: 30.2 pg (ref 26.0–34.0)
MCHC: 30.6 g/dL (ref 30.0–36.0)
MCV: 98.6 fL (ref 80.0–100.0)
Platelets: 192 10*3/uL (ref 150–400)
RBC: 3.64 MIL/uL — ABNORMAL LOW (ref 4.22–5.81)
RDW: 14.3 % (ref 11.5–15.5)
WBC: 14.2 10*3/uL — ABNORMAL HIGH (ref 4.0–10.5)
nRBC: 0 % (ref 0.0–0.2)

## 2023-02-12 LAB — HEMOGLOBIN A1C
Hgb A1c MFr Bld: 7.3 % — ABNORMAL HIGH (ref 4.8–5.6)
Mean Plasma Glucose: 162.81 mg/dL

## 2023-02-12 LAB — CBC WITH DIFFERENTIAL/PLATELET
Abs Immature Granulocytes: 0.07 10*3/uL (ref 0.00–0.07)
Basophils Absolute: 0.1 10*3/uL (ref 0.0–0.1)
Basophils Relative: 0 %
Eosinophils Absolute: 0.1 10*3/uL (ref 0.0–0.5)
Eosinophils Relative: 1 %
HCT: 37.3 % — ABNORMAL LOW (ref 39.0–52.0)
Hemoglobin: 11.8 g/dL — ABNORMAL LOW (ref 13.0–17.0)
Immature Granulocytes: 0 %
Lymphocytes Relative: 20 %
Lymphs Abs: 3.3 10*3/uL (ref 0.7–4.0)
MCH: 30.7 pg (ref 26.0–34.0)
MCHC: 31.6 g/dL (ref 30.0–36.0)
MCV: 97.1 fL (ref 80.0–100.0)
Monocytes Absolute: 1.3 10*3/uL — ABNORMAL HIGH (ref 0.1–1.0)
Monocytes Relative: 8 %
Neutro Abs: 11.7 10*3/uL — ABNORMAL HIGH (ref 1.7–7.7)
Neutrophils Relative %: 71 %
Platelets: 219 10*3/uL (ref 150–400)
RBC: 3.84 MIL/uL — ABNORMAL LOW (ref 4.22–5.81)
RDW: 14.3 % (ref 11.5–15.5)
WBC: 16.5 10*3/uL — ABNORMAL HIGH (ref 4.0–10.5)
nRBC: 0 % (ref 0.0–0.2)

## 2023-02-12 LAB — LACTIC ACID, PLASMA
Lactic Acid, Venous: 3.3 mmol/L (ref 0.5–1.9)
Lactic Acid, Venous: 2.2 mmol/L (ref 0.5–1.9)
Lactic Acid, Venous: 1.6 mmol/L (ref 0.5–1.9)

## 2023-02-12 LAB — LITHIUM LEVEL: Lithium Lvl: 0.46 mmol/L — ABNORMAL LOW (ref 0.60–1.20)

## 2023-02-12 LAB — GLUCOSE, CAPILLARY
Glucose-Capillary: 209 mg/dL — ABNORMAL HIGH (ref 70–99)
Glucose-Capillary: 280 mg/dL — ABNORMAL HIGH (ref 70–99)
Glucose-Capillary: 268 mg/dL — ABNORMAL HIGH (ref 70–99)
Glucose-Capillary: 306 mg/dL — ABNORMAL HIGH (ref 70–99)

## 2023-02-12 LAB — APTT: aPTT: 31 s (ref 24–36)

## 2023-02-12 LAB — HIV ANTIBODY (ROUTINE TESTING W REFLEX): HIV Screen 4th Generation wRfx: NONREACTIVE

## 2023-02-12 LAB — MAGNESIUM: Magnesium: 1.9 mg/dL (ref 1.7–2.4)

## 2023-02-12 LAB — PHOSPHORUS: Phosphorus: 3.8 mg/dL (ref 2.5–4.6)

## 2023-02-12 LAB — RESP PANEL BY RT-PCR (RSV, FLU A&B, COVID)  RVPGX2
Influenza A by PCR: NEGATIVE
Influenza B by PCR: NEGATIVE
Resp Syncytial Virus by PCR: NEGATIVE
SARS Coronavirus 2 by RT PCR: NEGATIVE

## 2023-02-12 LAB — VANCOMYCIN, RANDOM: Vancomycin Rm: 11 ug/mL

## 2023-02-12 LAB — PROCALCITONIN: Procalcitonin: 0.48 ng/mL

## 2023-02-12 LAB — CK: Total CK: 48 U/L — ABNORMAL LOW (ref 49–397)

## 2023-02-12 MED ORDER — SODIUM CHLORIDE 0.9 % IV SOLN: INTRAVENOUS | Status: DC

## 2023-02-12 MED ORDER — SODIUM CHLORIDE 0.9 % IV SOLN
1.0000 g | INTRAVENOUS | Status: DC
  Administered 2023-02-12 – 2023-02-13 (×2): 1 g via INTRAVENOUS
  Filled 2023-02-12 (×2): qty 10

## 2023-02-12 MED ORDER — AZITHROMYCIN 250 MG PO TABS
500.0000 mg | ORAL_TABLET | Freq: Every day | ORAL | Status: DC
  Administered 2023-02-12 – 2023-02-13 (×2): 500 mg via ORAL
  Filled 2023-02-12 (×2): qty 2

## 2023-02-12 MED ORDER — METOPROLOL TARTRATE 25 MG PO TABS
12.5000 mg | ORAL_TABLET | Freq: Two times a day (BID) | ORAL | Status: DC
  Administered 2023-02-12 – 2023-02-15 (×7): 12.5 mg via ORAL
  Filled 2023-02-12 (×7): qty 1

## 2023-02-12 MED ORDER — POTASSIUM CHLORIDE 10 MEQ/100ML IV SOLN
INTRAVENOUS | Status: AC
  Filled 2023-02-12: qty 100

## 2023-02-12 MED ORDER — SODIUM CHLORIDE 0.9 % IV SOLN: 1.0000 g | INTRAVENOUS | Status: DC

## 2023-02-12 MED ORDER — DIVALPROEX SODIUM 250 MG PO DR TAB
500.0000 mg | DELAYED_RELEASE_TABLET | Freq: Every morning | ORAL | Status: DC
  Administered 2023-02-12 – 2023-02-15 (×4): 500 mg via ORAL
  Filled 2023-02-12 (×5): qty 2

## 2023-02-12 MED ORDER — INSULIN ASPART 100 UNIT/ML IJ SOLN
0.0000 [IU] | Freq: Every day | INTRAMUSCULAR | Status: DC
  Administered 2023-02-12: 4 [IU] via SUBCUTANEOUS
  Administered 2023-02-13: 5 [IU] via SUBCUTANEOUS
  Administered 2023-02-14: 3 [IU] via SUBCUTANEOUS

## 2023-02-12 MED ORDER — VANCOMYCIN HCL 2000 MG/400ML IV SOLN
2000.0000 mg | Freq: Once | INTRAVENOUS | Status: AC
  Administered 2023-02-12: 2000 mg via INTRAVENOUS
  Filled 2023-02-12: qty 400

## 2023-02-12 MED ORDER — VANCOMYCIN HCL 1500 MG/300ML IV SOLN
1500.0000 mg | Freq: Every day | INTRAVENOUS | Status: DC
  Administered 2023-02-13: 1500 mg via INTRAVENOUS
  Filled 2023-02-12: qty 300

## 2023-02-12 MED ORDER — PROCHLORPERAZINE EDISYLATE 10 MG/2ML IJ SOLN
10.0000 mg | Freq: Four times a day (QID) | INTRAMUSCULAR | Status: DC | PRN
  Administered 2023-02-12 – 2023-02-13 (×2): 10 mg via INTRAVENOUS
  Filled 2023-02-12 (×2): qty 2

## 2023-02-12 MED ORDER — ARFORMOTEROL TARTRATE 15 MCG/2ML IN NEBU
15.0000 ug | INHALATION_SOLUTION | Freq: Two times a day (BID) | RESPIRATORY_TRACT | Status: DC
  Administered 2023-02-12 – 2023-02-15 (×7): 15 ug via RESPIRATORY_TRACT
  Filled 2023-02-12 (×7): qty 2

## 2023-02-12 MED ORDER — OXYMETAZOLINE HCL 0.05 % NA SOLN
1.0000 | Freq: Two times a day (BID) | NASAL | Status: AC
  Administered 2023-02-12 – 2023-02-14 (×6): 1 via NASAL
  Filled 2023-02-12 (×3): qty 30

## 2023-02-12 MED ORDER — ENOXAPARIN SODIUM 40 MG/0.4ML IJ SOSY
40.0000 mg | PREFILLED_SYRINGE | INTRAMUSCULAR | Status: DC
  Administered 2023-02-12 – 2023-02-15 (×4): 40 mg via SUBCUTANEOUS
  Filled 2023-02-12 (×4): qty 0.4

## 2023-02-12 MED ORDER — TAMSULOSIN HCL 0.4 MG PO CAPS
0.4000 mg | ORAL_CAPSULE | Freq: Every day | ORAL | Status: DC
  Administered 2023-02-12 – 2023-02-15 (×4): 0.4 mg via ORAL
  Filled 2023-02-12 (×4): qty 1

## 2023-02-12 MED ORDER — METRONIDAZOLE 500 MG/100ML IV SOLN
500.0000 mg | Freq: Once | INTRAVENOUS | Status: AC
  Administered 2023-02-12: 500 mg via INTRAVENOUS
  Filled 2023-02-12: qty 100

## 2023-02-12 MED ORDER — LITHIUM CARBONATE 150 MG PO CAPS
300.0000 mg | ORAL_CAPSULE | Freq: Two times a day (BID) | ORAL | Status: DC
  Administered 2023-02-12 – 2023-02-15 (×7): 300 mg via ORAL
  Filled 2023-02-12 (×7): qty 2

## 2023-02-12 MED ORDER — HYDROCODONE BIT-HOMATROP MBR 5-1.5 MG/5ML PO SOLN
5.0000 mL | ORAL | Status: DC | PRN
  Administered 2023-02-12 (×2): 5 mL via ORAL
  Filled 2023-02-12 (×2): qty 5

## 2023-02-12 MED ORDER — SODIUM CHLORIDE 0.9 % IV SOLN
INTRAVENOUS | Status: DC
Start: 2023-02-12 — End: 2023-02-12

## 2023-02-12 MED ORDER — ACETAMINOPHEN 325 MG PO TABS
650.0000 mg | ORAL_TABLET | Freq: Four times a day (QID) | ORAL | Status: DC | PRN
  Administered 2023-02-13: 650 mg via ORAL
  Filled 2023-02-12: qty 2

## 2023-02-12 MED ORDER — DIVALPROEX SODIUM 250 MG PO DR TAB
1000.0000 mg | DELAYED_RELEASE_TABLET | Freq: Every day | ORAL | Status: DC
  Administered 2023-02-12 – 2023-02-14 (×3): 1000 mg via ORAL
  Filled 2023-02-12 (×3): qty 4

## 2023-02-12 MED ORDER — INSULIN DETEMIR 100 UNIT/ML ~~LOC~~ SOLN
10.0000 [IU] | Freq: Every day | SUBCUTANEOUS | Status: DC
  Filled 2023-02-12: qty 0.1

## 2023-02-12 MED ORDER — METHYLPREDNISOLONE SODIUM SUCC 125 MG IJ SOLR
60.0000 mg | Freq: Two times a day (BID) | INTRAMUSCULAR | Status: DC
  Administered 2023-02-12 – 2023-02-13 (×3): 60 mg via INTRAVENOUS
  Filled 2023-02-12 (×3): qty 2

## 2023-02-12 MED ORDER — INSULIN ASPART 100 UNIT/ML IJ SOLN
0.0000 [IU] | Freq: Three times a day (TID) | INTRAMUSCULAR | Status: DC
Start: 2023-02-12 — End: 2023-02-15
  Administered 2023-02-12 (×2): 8 [IU] via SUBCUTANEOUS
  Administered 2023-02-13 (×2): 11 [IU] via SUBCUTANEOUS
  Administered 2023-02-13 – 2023-02-14 (×2): 15 [IU] via SUBCUTANEOUS
  Administered 2023-02-14: 5 [IU] via SUBCUTANEOUS
  Administered 2023-02-15: 8 [IU] via SUBCUTANEOUS
  Administered 2023-02-15: 11 [IU] via SUBCUTANEOUS

## 2023-02-12 MED ORDER — ATORVASTATIN CALCIUM 40 MG PO TABS
40.0000 mg | ORAL_TABLET | Freq: Every day | ORAL | Status: DC
  Administered 2023-02-12 – 2023-02-15 (×4): 40 mg via ORAL
  Filled 2023-02-12 (×4): qty 1

## 2023-02-12 MED ORDER — BUDESONIDE 0.5 MG/2ML IN SUSP
0.5000 mg | Freq: Two times a day (BID) | RESPIRATORY_TRACT | Status: DC
  Administered 2023-02-12 – 2023-02-15 (×7): 0.5 mg via RESPIRATORY_TRACT
  Filled 2023-02-12 (×7): qty 2

## 2023-02-12 MED ORDER — IPRATROPIUM-ALBUTEROL 0.5-2.5 (3) MG/3ML IN SOLN
3.0000 mL | Freq: Four times a day (QID) | RESPIRATORY_TRACT | Status: DC
  Administered 2023-02-12 – 2023-02-13 (×6): 3 mL via RESPIRATORY_TRACT
  Filled 2023-02-12 (×6): qty 3

## 2023-02-12 MED ORDER — LACTATED RINGERS IV BOLUS (SEPSIS)
1000.0000 mL | Freq: Once | INTRAVENOUS | Status: AC
  Administered 2023-02-12: 1000 mL via INTRAVENOUS

## 2023-02-12 MED ORDER — BENZTROPINE MESYLATE 1 MG PO TABS
1.0000 mg | ORAL_TABLET | Freq: Every day | ORAL | Status: DC
  Administered 2023-02-12 – 2023-02-13 (×2): 1 mg via ORAL
  Filled 2023-02-12 (×2): qty 1

## 2023-02-12 MED ORDER — GLUCERNA SHAKE PO LIQD
237.0000 mL | Freq: Three times a day (TID) | ORAL | Status: DC
Start: 2023-02-12 — End: 2023-02-15
  Administered 2023-02-12 – 2023-02-15 (×8): 237 mL via ORAL
  Filled 2023-02-12 (×2): qty 237

## 2023-02-12 MED ORDER — LACTATED RINGERS IV BOLUS
2000.0000 mL | Freq: Once | INTRAVENOUS | Status: AC
  Administered 2023-02-12: 2000 mL via INTRAVENOUS

## 2023-02-12 MED ORDER — INSULIN ASPART 100 UNIT/ML IJ SOLN: 0.0000 [IU] | Freq: Three times a day (TID) | INTRAMUSCULAR | Status: DC

## 2023-02-12 MED ORDER — ACETAMINOPHEN 650 MG RE SUPP: 650.0000 mg | Freq: Four times a day (QID) | RECTAL | Status: DC | PRN

## 2023-02-12 MED ORDER — SODIUM CHLORIDE 0.9 % IV SOLN
2.0000 g | Freq: Once | INTRAVENOUS | Status: AC
  Administered 2023-02-12: 2 g via INTRAVENOUS
  Filled 2023-02-12: qty 12.5

## 2023-02-12 MED ORDER — VANCOMYCIN HCL IN DEXTROSE 1-5 GM/200ML-% IV SOLN: 1000.0000 mg | Freq: Once | INTRAVENOUS | Status: DC

## 2023-02-12 MED ORDER — INSULIN GLARGINE-YFGN 100 UNIT/ML ~~LOC~~ SOLN
30.0000 [IU] | Freq: Every day | SUBCUTANEOUS | Status: DC
  Administered 2023-02-12 – 2023-02-13 (×2): 30 [IU] via SUBCUTANEOUS
  Filled 2023-02-12 (×3): qty 0.3

## 2023-02-12 MED ORDER — INSULIN ASPART 100 UNIT/ML IJ SOLN: 0.0000 [IU] | Freq: Every day | INTRAMUSCULAR | Status: DC

## 2023-02-12 NOTE — ED Triage Notes (Signed)
Pt arrives via [facility] from [facility]. Per EMS facility reports pt has had difficulty urinating, fever, cough, confusion and increased weakness that began today.

## 2023-02-12 NOTE — Hospital Course (Signed)
62 year old male with a history of hypertension, diabetes mellitus type 2, hyperlipidemia, bipolar disorder, tobacco abuse (quit 1 yr ago), COPD, and TBI presenting from [facility] with confusion and fever.  Apparently, the patient was wandering the halls and taking his clothes off and acting strangely.  His facility noted the patient to have a fever of 101.0 F.  He was taken the emergency department for further evaluation. In the ED, the patient was confused and not able to provide any history. On the morning of 02/12/2023, the patient was more awake and alert.  He states that he has been short of breath for about a week with a nonproductive cough.  He denies any chest pain, hemoptysis, nausea, vomiting or diarrhea, abdominal pain, dysuria.  He states that he has had his left foot ulcer for at least 3 months.  He denies any worsening drainage.  He has noted some erythema on his left leg dorsal surface over the past few days.  He states that he has been following a "foot doctor" for his foot ulcer.  In the ED, the patient was febrile to 102.2 F and tachycardic.  He was hemodynamically stable.  Oxygen saturation was 89% on room air.  He was placed on 4 L with saturation up to 96%.  The patient was started vancomycin, cefepime, and metronidazole in the ED and given fluid boluses as appropriate for sepsis protocol.
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
## 2023-02-12 NOTE — Progress Notes (Signed)
Pharmacy Antibiotic Note  Scott [name] is a 62 y.o. male admitted on 02/12/2023 with sepsis.  Pharmacy has been consulted for vancomycin dosing.  Plan: Vancomycin 1500 mg IV q24h F/U renal function   Height: 6\' 2"  (188 cm) Weight: 100.4 kg (221 lb 5.5 oz) IBW/kg (Calculated) : 82.2  Temp (24hrs), Avg:99.3 F (37.4 C), Min:97.8 F (36.6 C), Max:102.2 F (39 C)  Recent Labs  Lab 02/12/23 0020 02/12/23 0236 02/12/23 0453 02/12/23 2228  WBC 16.5*  --  14.2*  --   CREATININE 2.18*  --  1.93*  --   LATICACIDVEN 3.3* 2.2* 1.6  --   VANCORANDOM  --   --   --  11    Estimated Creatinine Clearance: 50.9 mL/min (A) (by C-G formula based on SCr of 1.93 mg/dL (H)).    No Known Allergies  Antimicrobials this admission: 11/5 Vancomycin >>  11/5 Ceftriaxone >>  11/5 Cefepime x 1 11/5 Metronidazole x 1  Dose adjustments this admission: None  Microbiology results: 11/5 BCx: IP    Scott [name] 02/12/2023 11:18 PM

## 2023-02-12 NOTE — ED Provider Notes (Signed)
[place] EMERGENCY DEPARTMENT AT [facility]  Provider Note  CSN: [id number] Arrival date & time: 02/12/23 0013  History Chief Complaint  Patient presents with  . Fever    Scott [name] is a 62 y.o. male brought to the ED via EMS from SNF where he was found to be confused and running a fever to 101F tonight. He had urinated on himself which is unusual. Took off his clothes and was wandering the halls when he would usually be asleep. He reports some recent cough, noted to be borderline hypoxic on arrival (SpO2 89% on RA). Does not typically wear oxygen. Has poorly controlled DM and a chronic ulcer on left foot.    Home Medications Prior to Admission medications   Medication Sig Start Date End Date Taking? Authorizing Provider  acetaminophen (TYLENOL) 325 MG tablet Take 650 mg by mouth every 6 (six) hours as needed.    [provider]  albuterol (VENTOLIN HFA) 108 (90 Base) MCG/ACT inhaler Inhale 2 puffs into the lungs every 6 (six) hours as needed for wheezing or shortness of breath.    [provider]  ALLERGY RELIEF 10 MG tablet Take 10 mg by mouth daily. 04/29/20   [provider]  amLODipine (NORVASC) 5 MG tablet Take 1 tablet (5 mg total) by mouth daily. 06/15/19   Erick Blinks, MD  atorvastatin (LIPITOR) 40 MG tablet Take 40 mg by mouth daily. 07/10/22   [provider]  benzonatate (TESSALON) 100 MG capsule Take 100 mg by mouth 3 (three) times daily. 03/12/22   [provider]  benztropine (COGENTIN) 1 MG tablet Take 1 mg by mouth 2 (two) times daily.  11/05/18   [provider]  cetirizine (ZYRTEC) 10 MG tablet Take by mouth. 07/10/22   [provider]  divalproex (DEPAKOTE) 500 MG DR tablet Take 1 tablet (500 mg total) by mouth 2 (two) times daily. Patient taking differently: Take 500-1,000 mg by mouth 2 (two) times daily. 05/03/15   [name], Ellin Goodie, MD  DROPSAFE SAFETY PEN NEEDLES 31G X 6 MM MISC USE FOR  ONCE DAILY LANTUS ADMINISTRATION. 03/27/22   Dani Gobble, NP  furosemide (LASIX) 20 MG tablet Take 20 mg by mouth daily.    [provider]  gabapentin (NEURONTIN) 100 MG capsule Take 100 mg by mouth at bedtime.    [provider]  gemfibrozil (LOPID) 600 MG tablet Take 1 tablet (600 mg total) by mouth 2 (two) times daily. 11/24/19   [name], Ankit C, MD  glipiZIDE (GLUCOTROL XL) 5 MG 24 hr tablet TAKE (1) TABLET BY MOUTH ONCE DAILY WITH BREAKFAST. 12/20/21   Dani Gobble, NP  glucose blood (EASYMAX TEST) test strip USE AS DIRECTED TO CHECK BLOOD SUGAR TWICE DAILY. 09/24/22   Dani Gobble, NP  Insulin Pen Needle (BD AUTOSHIELD DUO) 30G X 5 MM MISC USE FOR ONCE DAILY LANTUS ADMINISTRATION. 07/17/22   Dani Gobble, NP  isopropyl alcohol 70 % SOLN  11/21/20   [provider]  LANTUS SOLOSTAR 100 UNIT/ML Solostar Pen INJECT 60 UNITS SUBCUTANEOUSLY AT BEDTIME. (HOLD IF BS BELOW 60:CALL MD IF BS ABOVE 400) 12/24/22   Reardon, Freddi Starr, NP  linagliptin (TRADJENTA) 5 MG TABS tablet Take 1 tablet (5 mg total) by mouth daily. 05/03/15   [name], Braulio Conte B, MD  lithium carbonate 300 MG capsule Take 300 mg by mouth in the morning and at bedtime.    [provider]  Melatonin 3  MG SUBL Take 1 tablet by mouth at bedtime. 04/22/20   [provider]  meloxicam (MOBIC) 7.5 MG tablet Take 7.5 mg by mouth daily. Patient not taking: Reported on 01/14/2023 05/12/21   [provider]  metFORMIN (GLUCOPHAGE) 1000 MG tablet Take 0.5 tablets (500 mg total) by mouth 2 (two) times daily with a meal. 06/07/22   Dani Gobble, NP  metoprolol tartrate (LOPRESSOR) 25 MG tablet Take 0.5 tablets (12.5 mg total) by mouth 2 (two) times daily. 05/03/15   [name], Jolanta B, MD  nitrofurantoin, macrocrystal-monohydrate, (MACROBID) 100 MG capsule Take 100 mg by mouth 2 (two) times daily. Patient not taking: Reported on 01/14/2023    [provider]   phenazopyridine (PYRIDIUM) 200 MG tablet Take 1 tablet (200 mg total) by mouth 3 (three) times daily. Patient not taking: Reported on 01/14/2023 11/24/21   Teressa Lower, PA-C  potassium chloride SA (KLOR-CON) 20 MEQ tablet Take 20 mEq by mouth daily.    [provider]  risperiDONE (RISPERDAL) 2 MG tablet Take 1 tablet (2 mg total) by mouth 2 (two) times daily. Patient taking differently: Take 2 mg by mouth daily. 05/03/15   [name], Ellin Goodie, MD  SAFE TUSSIN DM 100-10 MG/5ML liquid as needed. Patient not taking: Reported on 01/14/2023 12/20/20   [provider]  silodosin (RAPAFLO) 8 MG CAPS capsule Take 1 capsule (8 mg total) by mouth at bedtime. 07/30/22   [name], Mardene Celeste, MD  tiotropium (SPIRIVA) 18 MCG inhalation capsule Place 1 capsule (18 mcg total) into inhaler and inhale daily. 05/03/15   [name], Braulio Conte B, MD  traZODone (DESYREL) 50 MG tablet Take 50 mg by mouth at bedtime.    [provider]     Allergies    Patient has no known allergies.   Review of Systems   Review of Systems Please see HPI for pertinent positives and negatives  Physical Exam BP (!) 150/80   Pulse (!) 112   Temp (!) 102.2 F (39 C) (Rectal) Comment: Simultaneous filing. User may not have seen previous data. Comment (Src): Simultaneous filing. User may not have seen previous data.  Resp 14   Wt 99.3 kg   SpO2 95%   BMI 32.34 kg/m   Physical Exam Vitals and nursing note reviewed.  Constitutional:      Appearance: Normal appearance.  HENT:     Head: Normocephalic and atraumatic.     Nose: Nose normal.     Mouth/Throat:     Mouth: Mucous membranes are moist.  Eyes:     Extraocular Movements: Extraocular movements intact.     Conjunctiva/sclera: Conjunctivae normal.  Cardiovascular:     Rate and Rhythm: Tachycardia present.  Pulmonary:     Effort: Pulmonary effort is normal.     Breath sounds: Normal breath sounds.  Abdominal:     General: Abdomen is  flat.     Palpations: Abdomen is soft.     Tenderness: There is no abdominal tenderness.  Musculoskeletal:        General: No swelling. Normal range of motion.     Cervical back: Neck supple.     Comments: Chronic appearing ulcer on L foot  Skin:    General: Skin is warm and dry.  Neurological:     General: No focal deficit present.     Mental Status: He is alert. He is disoriented.  Psychiatric:        Mood and Affect: Mood normal.  ED Results / Procedures / Treatments   EKG EKG Interpretation Date/Time:  Tuesday February 12 2023 00:52:27 EST Ventricular Rate:  113 PR Interval:  169 QRS Duration:  80 QT Interval:  287 QTC Calculation: 394 R Axis:   80  Text Interpretation: Sinus tachycardia Borderline repolarization abnormality No significant change since last tracing EARLIER SAME DATE Confirmed by Susy Frizzle [phone]) on 02/12/2023 12:56:39 AM  Procedures .Critical Care  Performed by: Pollyann Savoy, MD Authorized by: Pollyann Savoy, MD   Critical care provider statement:    Critical care time (minutes):  45   Critical care time was exclusive of:  Separately billable procedures and treating other patients   Critical care was necessary to treat or prevent imminent or life-threatening deterioration of the following conditions:  Sepsis   Critical care was time spent personally by me on the following activities:  Development of treatment plan with patient or surrogate, discussions with consultants, evaluation of patient's response to treatment, examination of patient, ordering and review of laboratory studies, ordering and review of radiographic studies, ordering and performing treatments and interventions, pulse oximetry, re-evaluation of patient's condition and review of old charts   Medications Ordered in the ED Medications  lactated ringers bolus 1,000 mL (1,000 mLs Intravenous New Bag/Given 02/12/23 0050)  metroNIDAZOLE (FLAGYL) IVPB 500 mg (500 mg  Intravenous New Bag/Given 02/12/23 0051)  vancomycin (VANCOREADY) IVPB 2000 mg/400 mL (2,000 mg Intravenous New Bag/Given 02/12/23 0051)  lactated ringers bolus 2,000 mL (has no administration in time range)  ceFEPIme (MAXIPIME) 2 g in sodium chloride 0.9 % 100 mL IVPB (0 g Intravenous Stopped 02/12/23 0050)    Initial Impression and Plan  Patient here with fever, confusion, tachycardia and hypoxia. Consider PNA, UTI, viral process. Less likely wound infection. Sepsis protocol initiated including broad spectrum Abx pending source identification   ED Course   Clinical Course as of 02/12/23 0132  Tue Feb 12, 2023  6045 I personally viewed the images from radiology studies and agree with radiologist interpretation: CXR with atelectasis, no infiltrates or consolidation.  [CS]  0050 CBC with leukocytosis.  [CS]  0101 CMP with mildly increased Cr from prior baseline; hyperglycemia without acidosis. Coags are neg.  [CS]  0104 Lactic acid is elevated, will give 30cc/kg fluid bolus and recheck. Plan admission for further management.  [CS]  0127 Spoke with Dr. Thomes Dinning, Hospitalist, who will accept for admission.  [CS]  0131 Covid/Flu/RSV swab is neg.  [CS]    Clinical Course User Index [CS] Pollyann Savoy, MD     MDM Rules/Calculators/A&P Medical Decision Making Problems Addressed: Metabolic encephalopathy: acute illness or injury Sepsis with acute renal failure, due to unspecified organism, unspecified acute renal failure type, unspecified whether septic shock present [facility]): acute illness or injury  Amount and/or Complexity of Data Reviewed Labs: ordered. Decision-making details documented in ED Course. Radiology: ordered and independent interpretation performed. Decision-making details documented in ED Course. ECG/medicine tests: ordered and independent interpretation performed. Decision-making details documented in ED Course.  Risk Prescription drug management. Decision regarding  hospitalization.     Final Clinical Impression(s) / ED Diagnoses Final diagnoses:  Sepsis with acute renal failure, due to unspecified organism, unspecified acute renal failure type, unspecified whether septic shock present [facility])  Metabolic encephalopathy    Rx / DC Orders ED Discharge Orders     None        Pollyann Savoy, MD 02/12/23 0131

## 2023-02-12 NOTE — Progress Notes (Addendum)
PROGRESS NOTE  Scott [name] WUJ:[id number] DOB: 02/07/1961 DOA: 02/12/2023 PCP: Benetta Spar, MD  Brief History:  62 year old male with a history of hypertension, diabetes mellitus type 2, hyperlipidemia, bipolar disorder, tobacco abuse (quit 1 yr ago), COPD, and TBI presenting from [facility] with confusion and fever.  Apparently, the patient was wandering the halls and taking his clothes off and acting strangely.  His facility noted the patient to have a fever of 101.0 F.  He was taken the emergency department for further evaluation. In the ED, the patient was confused and not able to provide any history. On the morning of 02/12/2023, the patient was more awake and alert.  He states that he has been short of breath for about a week with a nonproductive cough.  He denies any chest pain, hemoptysis, nausea, vomiting or diarrhea, abdominal pain, dysuria.  He states that he has had his left foot ulcer for at least 3 months.  He denies any worsening drainage.  He has noted some erythema on his left leg dorsal surface over the past few days.  He states that he has been following a "foot doctor" for his foot ulcer.  In the ED, the patient was febrile to 102.2 F and tachycardic.  He was hemodynamically stable.  Oxygen saturation was 89% on room air.  He was placed on 4 L with saturation up to 96%.  The patient was started vancomycin, cefepime, and metronidazole in the ED and given fluid boluses as appropriate for sepsis protocol.    Assessment/Plan: Severe sepsis -Present on admission -Secondary to cellulitis and and  pulmonary source -Lactic acid peaked 3.3>> 1.6 -Check PCT 0.48 -Continue IV fluids -Continue vancomycin and ceftriaxone pending culture data  Acute respiratory failure with hypoxia -Personally reviewed chest x-ray--increased interstitial markings without frank consolidation -11/5 CT chest--airspace opacity and consolidation in the left lung base, with  scattered ground-glass airspace opacity in the right lung base.  -Stable on 4 L -Wean oxygen for saturation greater 92%  Lobar pneumonia -CT chest as discussed above -MRSA screen -continue ceftriaxone -add azithro  COPD exacerbation -Start Brovana -Start Pulmicort -Start Solu-Medrol -Start DuoNebs -COVID-19 PCR negative/influenza negative -Check viral respiratory panel  Cellulitis left lower extremity -See pictures below -Good palpable pulses -MRI leg and foot if no improvement or persistent fever  Acute on chronic renal failure--CKD 3a -Baseline creatinine 1.4-1.6 -Presented with serum creatinine 3.18 -Continue IV fluids -Holding furosemide  Uncontrolled diabetes mellitus type 2 with hyperglycemia -NovoLog sliding scale -05/23/2021 hemoglobin A1c 7.9 -Repeat A1c -Holding glipizide and tradjenta -start semglee 30 units daily  Acute metabolic encephalopathy -Secondary to infectious process -Improving with fluids and IV antibiotics -Holding gabapentin/riperdal temporarily  Chronic diabetic foot ulcer -See pictures below -Does not appear to be infected on exam -MRI foot if no improvement/persistent fever  Essential hypertension -Continue metoprolol tartrate -Holding amlodipine temporarily  Mixed hyperlipidemia -Continue statin  Bipolar disorder -Continue valproic acid, lithium, benztropine      Family Communication:  no  Family at bedside  Consultants:  none  Code Status:  FULL  DVT Prophylaxis: [place] Lovenox   Procedures: As Listed in Progress Note Above  Antibiotics: Vanc 11/5>> Ceftriaxone 11/5>>    Total time spent 50 minutes.  Greater than 50% spent face to face counseling and coordinating care.    Subjective: Patient denies fevers, chills, headache, chest pain,nausea, vomiting, diarrhea, abdominal pain, dysuria, hematuria, hematochezia, and melena.  Complains of sob  and nonproductive cough   Objective: Vitals:   02/12/23 0345  02/12/23 0400 02/12/23 0514 02/12/23 0516  BP: (!) 168/77 (!) 158/92 122/89   Pulse: (!) 113 (!) 102 (!) 111   Resp: (!) 23 12 15    Temp:  100 F (37.8 C) 98 F (36.7 C)   TempSrc:  Rectal Oral   SpO2: 93% 95% 95%   Weight:    100.4 kg  Height:    6\' 2"  (1.88 m)    Intake/Output Summary (Last 24 hours) at 02/12/2023 0746 Last data filed at 02/12/2023 0600 Gross per 24 hour  Intake 3673.97 ml  Output 1050 ml  Net 2623.97 ml   Weight change:  Exam:  General:  Pt is alert, follows commands appropriately, not in acute distress HEENT: No icterus, No thrush, No neck mass, [place]/AT Cardiovascular: RRR, S1/S2, no rubs, no gallops Respiratory: bibasilar rales.  Bilateral wheeze Abdomen: Soft/+BS, non tender, non distended, no guarding Extremities: 1 +LLE edema, No lymphangitis, No petechiae, No rashes, no synovitis        Data Reviewed: I have personally reviewed following labs and imaging studies Basic Metabolic Panel: Recent Labs  Lab 02/12/23 0020 02/12/23 0453  NA 132* 135  K 4.5 4.1  CL 101 104  CO2 19* 21*  GLUCOSE 249* 210*  BUN 38* 33*  CREATININE 2.18* 1.93*  CALCIUM 9.2 8.9  MG  --  1.9  PHOS  --  3.8   Liver Function Tests: Recent Labs  Lab 02/12/23 0020 02/12/23 0453  AST 14* 12*  ALT 15 14  ALKPHOS 69 59  BILITOT 0.7 0.7  PROT 7.4 6.7  ALBUMIN 2.9* 2.7*   No results for input(s): "LIPASE", "AMYLASE" in the last 168 hours. No results for input(s): "AMMONIA" in the last 168 hours. Coagulation Profile: Recent Labs  Lab 02/12/23 0020  INR 1.1   CBC: Recent Labs  Lab 02/12/23 0020 02/12/23 0453  WBC 16.5* 14.2*  NEUTROABS 11.7*  --   HGB 11.8* 11.0*  HCT 37.3* 35.9*  MCV 97.1 98.6  PLT 219 192   Cardiac Enzymes: No results for input(s): "CKTOTAL", "CKMB", "CKMBINDEX", "TROPONINI" in the last 168 hours. BNP: Invalid input(s): "POCBNP" CBG: Recent Labs  Lab 02/12/23 0719  GLUCAP 209*   HbA1C: No results for input(s): "HGBA1C" in  the last 72 hours. Urine analysis:    Component Value Date/Time   COLORURINE STRAW (A) 02/12/2023 0147   APPEARANCEUR CLEAR 02/12/2023 0147   APPEARANCEUR Clear 12/05/2022 1307   LABSPEC 1.010 02/12/2023 0147   PHURINE 6.0 02/12/2023 0147   GLUCOSEU 150 (A) 02/12/2023 0147   HGBUR SMALL (A) 02/12/2023 0147   BILIRUBINUR NEGATIVE 02/12/2023 0147   BILIRUBINUR Negative 12/05/2022 1307   KETONESUR NEGATIVE 02/12/2023 0147   PROTEINUR 100 (A) 02/12/2023 0147   UROBILINOGEN 0.2 07/17/2011 2014   NITRITE NEGATIVE 02/12/2023 0147   LEUKOCYTESUR NEGATIVE 02/12/2023 0147   Sepsis Labs: @LABRCNTIP (procalcitonin:4,lacticidven:4) ) Recent Results (from the past 240 hour(s))  Culture, blood (Routine X 2) w Reflex to ID Panel     Status: None (Preliminary result)   Collection Time: 02/12/23 12:20 AM   Specimen: Left Antecubital  Result Value Ref Range Status   Specimen Description LEFT ANTECUBITAL  Final   Special Requests   Final    BOTTLES DRAWN AEROBIC AND ANAEROBIC Blood Culture results may not be optimal due to an excessive volume of blood received in culture bottles   Culture   Final    NO GROWTH <  12 HOURS Performed at [facility], [address] [place], [place] [address]    Report Status PENDING  Incomplete  Resp panel by RT-PCR (RSV, Flu A&B, Covid) Anterior Nasal Swab     Status: None   Collection Time: 02/12/23 12:37 AM   Specimen: Anterior Nasal Swab  Result Value Ref Range Status   SARS Coronavirus 2 by RT PCR NEGATIVE NEGATIVE Final    Comment: (NOTE) SARS-CoV-2 target nucleic acids are NOT DETECTED.  The SARS-CoV-2 RNA is generally detectable in upper respiratory specimens during the acute phase of infection. The lowest concentration of SARS-CoV-2 viral copies this assay can detect is 138 copies/mL. A negative result does not preclude SARS-Cov-2 infection and should not be used as the sole basis for treatment or other patient management decisions. A negative result  may occur with  improper specimen collection/handling, submission of specimen other than nasopharyngeal swab, presence of viral mutation(s) within the areas targeted by this assay, and inadequate number of viral copies(<138 copies/mL). A negative result must be combined with clinical observations, patient history, and epidemiological information. The expected result is Negative.  Fact Sheet for Patients:  BloggerCourse.com  Fact Sheet for Healthcare Providers:  SeriousBroker.it  This test is no t yet approved or cleared by the [place] FDA and  has been authorized for detection and/or diagnosis of SARS-CoV-2 by FDA under an Emergency Use Authorization (EUA). This EUA will remain  in effect (meaning this test can be used) for the duration of the COVID-19 declaration under Section 564(b)(1) of the Act, 21 U.S.C.section 360bbb-3(b)(1), unless the authorization is terminated  or revoked sooner.       Influenza A by PCR NEGATIVE NEGATIVE Final   Influenza B by PCR NEGATIVE NEGATIVE Final    Comment: (NOTE) The Xpert Xpress SARS-CoV-2/FLU/RSV plus assay is intended as an aid in the diagnosis of influenza from Nasopharyngeal swab specimens and should not be used as a sole basis for treatment. Nasal washings and aspirates are unacceptable for Xpert Xpress SARS-CoV-2/FLU/RSV testing.  Fact Sheet for Patients: BloggerCourse.com  Fact Sheet for Healthcare Providers: SeriousBroker.it  This test is not yet approved or cleared by the [place] FDA and has been authorized for detection and/or diagnosis of SARS-CoV-2 by FDA under an Emergency Use Authorization (EUA). This EUA will remain in effect (meaning this test can be used) for the duration of the COVID-19 declaration under Section 564(b)(1) of the Act, 21 U.S.C. section 360bbb-3(b)(1), unless the authorization is terminated  or revoked.     Resp Syncytial Virus by PCR NEGATIVE NEGATIVE Final    Comment: (NOTE) Fact Sheet for Patients: BloggerCourse.com  Fact Sheet for Healthcare Providers: SeriousBroker.it  This test is not yet approved or cleared by the [place] FDA and has been authorized for detection and/or diagnosis of SARS-CoV-2 by FDA under an Emergency Use Authorization (EUA). This EUA will remain in effect (meaning this test can be used) for the duration of the COVID-19 declaration under Section 564(b)(1) of the Act, 21 U.S.C. section 360bbb-3(b)(1), unless the authorization is terminated or revoked.  Performed at [facility], [address] [place], [place] [address]   Blood Culture (routine x 2)     Status: None (Preliminary result)   Collection Time: 02/12/23  1:07 AM   Specimen: BLOOD  Result Value Ref Range Status   Specimen Description BLOOD BLOOD RIGHT HAND  Final   Special Requests   Final    BOTTLES DRAWN AEROBIC AND ANAEROBIC Blood Culture results may  not be optimal due to an excessive volume of blood received in culture bottles   Culture   Final    NO GROWTH < 12 HOURS Performed at [facility], [address], [place], [place] [address]    Report Status PENDING  Incomplete     Scheduled Meds:  enoxaparin (LOVENOX) injection  40 mg Subcutaneous Q24H   feeding supplement (GLUCERNA SHAKE)  237 mL Oral TID BM   insulin aspart  0-5 Units Subcutaneous QHS   insulin aspart  0-9 Units Subcutaneous TID WC   insulin detemir  10 Units Subcutaneous QHS   oxymetazoline  1 spray Each Nare BID   Continuous Infusions:  sodium chloride 100 mL/hr at 02/12/23 0514   cefTRIAXone (ROCEPHIN)  IV      Procedures/Studies: DG Chest Port 1 View  Result Date: 02/12/2023 CLINICAL DATA:  Possible sepsis. EXAM: PORTABLE CHEST 1 VIEW COMPARISON:  01/25/2022. FINDINGS: The heart size and mediastinal contours are within normal limits. Mild  atelectasis is noted at the lung bases. No effusion or pneumothorax is seen. No acute osseous abnormality is identified. IMPRESSION: Mild atelectasis at the lung bases. Electronically Signed   By: Thornell Sartorius M.D.   On: 02/12/2023 00:40   DG Foot Complete Left  Result Date: 01/14/2023 CLINICAL DATA:  Larey Seat, bruising and discoloration to top of left foot and left great toe EXAM: LEFT FOOT - COMPLETE 3+ VIEW COMPARISON:  None Available. FINDINGS: No acute fracture or dislocation. Degenerative arthritis in the IP joints. IMPRESSION: No acute fracture or dislocation. Electronically Signed   By: Minerva Fester M.D.   On: 01/14/2023 02:28   DG HIP UNILAT WITH PELVIS 2-3 VIEWS LEFT  Result Date: 01/14/2023 CLINICAL DATA:  Fall EXAM: DG HIP (WITH OR WITHOUT PELVIS) 2-3V LEFT COMPARISON:  None Available. FINDINGS: There is no evidence of hip fracture or dislocation. Mild degenerative arthritis both hips. IMPRESSION: No acute fracture or dislocation. Electronically Signed   By: Minerva Fester M.D.   On: 01/14/2023 02:26    Catarina Hartshorn, DO  [facility]  If 7PM-7AM, please contact night-coverage [email or web address] Password [email or web address] 02/12/2023, 7:46 AM   LOS: 0 days

## 2023-02-12 NOTE — Progress Notes (Signed)
Pharmacy Antibiotic Note  Scott [name] is a 62 y.o. male admitted on 02/12/2023 with sepsis.  Pharmacy has been consulted for vancomycin dosing.  Plan: Day 1 of antibiotics Patient given vancomycin 2000 mg IV x 1 loading dose Check random vanc level tonight (~24 hours since dose) given AKI (SCr 1.93, baseline 1.4-1.6) Patient is also on ceftriaxone 1 g IV Q24H Continue to monitor renal function and follow culture results   Height: 6\' 2"  (188 cm) Weight: 100.4 kg (221 lb 5.5 oz) IBW/kg (Calculated) : 82.2  Temp (24hrs), Avg:99.5 F (37.5 C), Min:98 F (36.7 C), Max:102.2 F (39 C)  Recent Labs  Lab 02/12/23 0020 02/12/23 0236 02/12/23 0453  WBC 16.5*  --  14.2*  CREATININE 2.18*  --  1.93*  LATICACIDVEN 3.3* 2.2* 1.6    Estimated Creatinine Clearance: 50.9 mL/min (A) (by C-G formula based on SCr of 1.93 mg/dL (H)).    No Known Allergies  Antimicrobials this admission: 11/5 Vancomycin >>  11/5 Ceftriaxone >>  11/5 Cefepime x 1 11/5 Metronidazole x 1  Dose adjustments this admission: None  Microbiology results: 11/5 BCx: IP  Thank you for allowing pharmacy to be a part of this patient's care.  Merryl Hacker, PharmD Clinical Pharmacist 02/12/2023 6:23 PM

## 2023-02-12 NOTE — Progress Notes (Signed)
Noted in chart telemetry tech marked patient Afib. Pt has no hx of afib. Msg Dr. Thomes Dinning regarding EKG. EKG completed and Dr. Thomes Dinning aware of results.

## 2023-02-12 NOTE — Progress Notes (Addendum)
Pt admitted to unit 300 from ED. Upon arrival pt flagged mews due ot HR 111. Oxygen currently at 4 L/[place]. Condom catheter placed. Ulcer present to left foot. Measured and documented. Fluids started. MD contacted for tele orders   02/12/23 0514  Assess: MEWS Score  Temp 98 F (36.7 C)  BP 122/89  MAP (mmHg) 100  Pulse Rate (!) 111  Resp 15  SpO2 95 %  O2 Device Nasal Cannula  Assess: MEWS Score  MEWS Temp 0  MEWS Systolic 0  MEWS Pulse 2  MEWS RR 0  MEWS LOC 0  MEWS Score 2  MEWS Score Color Yellow  Assess: if the MEWS score is Yellow or Red  Were vital signs accurate and taken at a resting state? Yes  Does the patient meet 2 or more of the SIRS criteria? No  MEWS guidelines implemented  Yes, yellow  Treat  MEWS Interventions Considered administering scheduled or prn medications/treatments as ordered  Take Vital Signs  Increase Vital Sign Frequency  Yellow: Q2hr x1, continue Q4hrs until patient remains green for 12hrs  Escalate  MEWS: Escalate Yellow: Discuss with charge nurse and consider notifying provider and/or RRT  Provider Notification  Provider Name/Title Dr. Thomes Dinning  Date Provider Notified 02/12/23  Time Provider Notified [phone]  Notification Reason Other (Comment) (yellow mews)  Provider response Other (Comment) (aware. Tele orders placed.)  Date of Provider Response 02/12/23  Time of Provider Response 0527  Assess: SIRS CRITERIA  SIRS Temperature  0  SIRS Pulse 1  SIRS Respirations  0  SIRS WBC 0  SIRS Score Sum  1

## 2023-02-12 NOTE — Plan of Care (Signed)
  Problem: Education: Goal: Knowledge of General Education information will improve Description: Including pain rating scale, medication(s)/side effects and non-pharmacologic comfort measures Outcome: Progressing   Problem: Nutrition: Goal: Adequate nutrition will be maintained Outcome: Progressing   Problem: Elimination: Goal: Will not experience complications related to bowel motility Outcome: Progressing   Problem: Skin Integrity: Goal: Risk for impaired skin integrity will decrease Outcome: Progressing   Problem: Metabolic: Goal: Ability to maintain appropriate glucose levels will improve Outcome: Progressing   Problem: Nutritional: Goal: Maintenance of adequate nutrition will improve Outcome: Progressing

## 2023-02-12 NOTE — Progress Notes (Signed)
Mobility Specialist Progress Note:    02/12/23 1500  Mobility  Activity Stood at bedside;Dangled on edge of bed (STS x3)  Level of Assistance Minimal assist, patient does 75% or more  Assistive Device Front wheel walker  Range of Motion/Exercises Active;All extremities  Activity Response Tolerated well  Mobility Referral Yes  $Mobility charge 1 Mobility  Mobility Specialist Start Time (ACUTE ONLY) 1430  Mobility Specialist Stop Time (ACUTE ONLY) 1450  Mobility Specialist Time Calculation (min) (ACUTE ONLY) 20 min   Pt received in bed, agreeable to mobility. Required MinA to stand with RW. Tolerated well, deferred ambulation d/t shakiness and unsteadiness. Returned pt supine, bed alarm on. Call bell in reach, all needs met.   Lawerance Bach Mobility Specialist Please contact via Special educational needs teacher or  Rehab office at [phone]
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
## 2023-02-12 NOTE — Sepsis Progress Note (Signed)
Elink monitoring for the code sepsis protocol.  

## 2023-02-13 DIAGNOSIS — E872 Acidosis, unspecified: Secondary | ICD-10-CM | POA: Diagnosis not present

## 2023-02-13 DIAGNOSIS — L03116 Cellulitis of left lower limb: Secondary | ICD-10-CM | POA: Diagnosis not present

## 2023-02-13 DIAGNOSIS — G9341 Metabolic encephalopathy: Secondary | ICD-10-CM | POA: Diagnosis not present

## 2023-02-13 DIAGNOSIS — N179 Acute kidney failure, unspecified: Secondary | ICD-10-CM | POA: Diagnosis not present

## 2023-02-13 LAB — GLUCOSE, CAPILLARY
Glucose-Capillary: 313 mg/dL — ABNORMAL HIGH (ref 70–99)
Glucose-Capillary: 307 mg/dL — ABNORMAL HIGH (ref 70–99)
Glucose-Capillary: 377 mg/dL — ABNORMAL HIGH (ref 70–99)
Glucose-Capillary: 379 mg/dL — ABNORMAL HIGH (ref 70–99)

## 2023-02-13 LAB — BASIC METABOLIC PANEL
Anion gap: 11 (ref 5–15)
BUN: 38 mg/dL — ABNORMAL HIGH (ref 8–23)
CO2: 24 mmol/L (ref 22–32)
Calcium: 9 mg/dL (ref 8.9–10.3)
Chloride: 108 mmol/L (ref 98–111)
Creatinine, Ser: 1.84 mg/dL — ABNORMAL HIGH (ref 0.61–1.24)
GFR, Estimated: 41 mL/min — ABNORMAL LOW (ref >60)
Glucose, Bld: 344 mg/dL — ABNORMAL HIGH (ref 70–99)
Potassium: 4.1 mmol/L (ref 3.5–5.1)
Sodium: 143 mmol/L (ref 135–145)

## 2023-02-13 LAB — RESPIRATORY PANEL BY PCR

## 2023-02-13 LAB — CBC
HCT: 34.9 % — ABNORMAL LOW (ref 39.0–52.0)
Hemoglobin: 11 g/dL — ABNORMAL LOW (ref 13.0–17.0)
MCH: 31 pg (ref 26.0–34.0)
MCHC: 31.5 g/dL (ref 30.0–36.0)
MCV: 98.3 fL (ref 80.0–100.0)
Platelets: 205 10*3/uL (ref 150–400)
RBC: 3.55 MIL/uL — ABNORMAL LOW (ref 4.22–5.81)
RDW: 14.3 % (ref 11.5–15.5)
WBC: 9.7 10*3/uL (ref 4.0–10.5)
nRBC: 0 % (ref 0.0–0.2)

## 2023-02-13 LAB — MRSA NEXT GEN BY PCR, NASAL: MRSA by PCR Next Gen: NOT DETECTED

## 2023-02-13 LAB — MAGNESIUM: Magnesium: 2.3 mg/dL (ref 1.7–2.4)

## 2023-02-13 MED ORDER — RISPERIDONE 1 MG PO TABS
2.0000 mg | ORAL_TABLET | Freq: Every day | ORAL | Status: DC
Start: 2023-02-13 — End: 2023-02-15
  Administered 2023-02-13 – 2023-02-15 (×3): 2 mg via ORAL
  Filled 2023-02-13 (×3): qty 2

## 2023-02-13 MED ORDER — GEMFIBROZIL 600 MG PO TABS: 600.0000 mg | ORAL_TABLET | Freq: Two times a day (BID) | ORAL | Status: DC

## 2023-02-13 MED ORDER — DOXYCYCLINE HYCLATE 100 MG PO TABS
100.0000 mg | ORAL_TABLET | Freq: Two times a day (BID) | ORAL | Status: DC
  Administered 2023-02-13 – 2023-02-15 (×4): 100 mg via ORAL
  Filled 2023-02-13 (×4): qty 1

## 2023-02-13 MED ORDER — LITHIUM CARBONATE 150 MG PO CAPS: 300.0000 mg | ORAL_CAPSULE | Freq: Two times a day (BID) | ORAL | Status: DC

## 2023-02-13 MED ORDER — ATORVASTATIN CALCIUM 40 MG PO TABS
40.0000 mg | ORAL_TABLET | Freq: Every day | ORAL | Status: DC
  Filled 2023-02-13: qty 1

## 2023-02-13 MED ORDER — INSULIN ASPART 100 UNIT/ML IJ SOLN
10.0000 [IU] | Freq: Three times a day (TID) | INTRAMUSCULAR | Status: DC
  Administered 2023-02-13 (×2): 10 [IU] via SUBCUTANEOUS

## 2023-02-13 MED ORDER — TRAZODONE HCL 50 MG PO TABS
50.0000 mg | ORAL_TABLET | Freq: Every day | ORAL | Status: DC
  Administered 2023-02-13 – 2023-02-14 (×2): 50 mg via ORAL
  Filled 2023-02-13 (×2): qty 1

## 2023-02-13 MED ORDER — BENZTROPINE MESYLATE 1 MG PO TABS
1.0000 mg | ORAL_TABLET | Freq: Two times a day (BID) | ORAL | Status: DC
  Administered 2023-02-13 – 2023-02-15 (×4): 1 mg via ORAL
  Filled 2023-02-13 (×4): qty 1

## 2023-02-13 MED ORDER — METOPROLOL TARTRATE 25 MG PO TABS: 12.5000 mg | ORAL_TABLET | Freq: Two times a day (BID) | ORAL | Status: DC

## 2023-02-13 MED ORDER — METHYLPREDNISOLONE SODIUM SUCC 40 MG IJ SOLR: 20.0000 mg | Freq: Every day | INTRAMUSCULAR | Status: DC

## 2023-02-13 MED ORDER — INSULIN GLARGINE-YFGN 100 UNIT/ML ~~LOC~~ SOLN
30.0000 [IU] | Freq: Two times a day (BID) | SUBCUTANEOUS | Status: DC
  Administered 2023-02-14: 30 [IU] via SUBCUTANEOUS
  Filled 2023-02-13 (×2): qty 0.3

## 2023-02-13 MED ORDER — METHYLPREDNISOLONE SODIUM SUCC 40 MG IJ SOLR: 40.0000 mg | Freq: Two times a day (BID) | INTRAMUSCULAR | Status: DC

## 2023-02-13 MED ORDER — ALBUTEROL SULFATE (2.5 MG/3ML) 0.083% IN NEBU: 2.5000 mg | INHALATION_SOLUTION | Freq: Four times a day (QID) | RESPIRATORY_TRACT | Status: DC | PRN

## 2023-02-13 MED ORDER — AMLODIPINE BESYLATE 5 MG PO TABS
5.0000 mg | ORAL_TABLET | Freq: Every day | ORAL | Status: DC
  Administered 2023-02-13 – 2023-02-15 (×3): 5 mg via ORAL
  Filled 2023-02-13 (×3): qty 1

## 2023-02-13 MED ORDER — IPRATROPIUM-ALBUTEROL 0.5-2.5 (3) MG/3ML IN SOLN
3.0000 mL | Freq: Two times a day (BID) | RESPIRATORY_TRACT | Status: DC
  Administered 2023-02-14 – 2023-02-15 (×3): 3 mL via RESPIRATORY_TRACT
  Filled 2023-02-13 (×3): qty 3

## 2023-02-13 NOTE — Plan of Care (Signed)
  Problem: Acute Rehab PT Goals(only PT should resolve) Goal: Pt Will Go Supine/Side To Sit Flowsheets (Taken 02/13/2023 1319) Pt will go Supine/Side to Sit: Independently Goal: Patient Will Transfer Sit To/From Stand Flowsheets (Taken 02/13/2023 1319) Patient will transfer sit to/from stand: with modified independence Goal: Pt Will Transfer Bed To Chair/Chair To Bed Flowsheets (Taken 02/13/2023 1319) Pt will Transfer Bed to Chair/Chair to Bed: with modified independence Goal: Pt Will Ambulate Flowsheets (Taken 02/13/2023 1319) Pt will Ambulate:  with modified independence  > 125 feet  with rolling walker  Scott [name] PT, DPT Physical Therapist with [facility] [phone] office

## 2023-02-13 NOTE — TOC Progression Note (Signed)
Transition of Care [facility]) - Progression Note    Patient Details  Name: Scott [name] MRN: [id number] Date of Birth: 03-27-1961  [facility] CM/SW Contact  [place], [place] Phone Number: 02/13/2023, 2:45 PM  Clinical Narrative:    CSW reached out to pts legal guardian to review PT recommendations. LG did not answer, secure VM left with request for return call. TOC to follow.   Expected Discharge Plan: Assisted Living Barriers to Discharge: Continued Medical Work up  Expected Discharge Plan and Services In-house Referral: Clinical Social Work Discharge Planning Services: CM Consult Post Acute Care Choice: Resumption of Svcs/PTA Provider Living arrangements for the past 2 months: Assisted Living Facility                                       Social Determinants of Health (SDOH) Interventions SDOH Screenings   Food Insecurity: No Food Insecurity (02/12/2023)  Housing: Low Risk  (02/12/2023)  Transportation Needs: No Transportation Needs (02/12/2023)  Utilities: Not At Risk (02/12/2023)  Tobacco Use: Low Risk  (01/14/2023)    Readmission Risk Interventions    02/12/2023   10:56 AM  Readmission Risk Prevention Plan  Transportation Screening Complete  HRI or Home Care Consult Complete  Social Work Consult for Recovery Care Planning/Counseling Complete  Palliative Care Screening Not Applicable  Medication Review Oceanographer) Complete

## 2023-02-13 NOTE — Progress Notes (Signed)
PROGRESS NOTE   Scott [name]  ZOX:[id number] DOB: 1960/07/24 DOA: 02/12/2023 PCP: Benetta Spar, MD   Chief Complaint  Patient presents with   Fever   Level of care: Med-Surg  Brief Admission History:  62 year old male with a history of hypertension, diabetes mellitus type 2, hyperlipidemia, bipolar disorder, tobacco abuse (quit 1 yr ago), COPD, and TBI presenting from [facility] with confusion and fever.  Apparently, the patient was wandering the halls and taking his clothes off and acting strangely.  His facility noted the patient to have a fever of 101.0 F.  He was taken the emergency department for further evaluation. In the ED, the patient was confused and not able to provide any history. On the morning of 02/12/2023, the patient was more awake and alert.  He states that he has been short of breath for about a week with a nonproductive cough.  He denies any chest pain, hemoptysis, nausea, vomiting or diarrhea, abdominal pain, dysuria.  He states that he has had his left foot ulcer for at least 3 months.  He denies any worsening drainage.  He has noted some erythema on his left leg dorsal surface over the past few days.  He states that he has been following a "foot doctor" for his foot ulcer.  In the ED, the patient was febrile to 102.2 F and tachycardic.  He was hemodynamically stable.  Oxygen saturation was 89% on room air.  He was placed on 4 L with saturation up to 96%.  The patient was started vancomycin, cefepime, and metronidazole in the ED and given fluid boluses as appropriate for sepsis protocol.    Assessment and Plan:  Severe sepsis -Present on admission -Secondary to cellulitis and pulmonary source -Lactic acid peaked 3.3>> 1.6 -Check PCT 0.48 -initially treated with vancomycin and ceftriaxone with plan to de-escalate 11/6   Acute respiratory failure with hypoxia -Personally reviewed chest x-ray--increased interstitial markings without frank  consolidation -11/5 CT chest--airspace opacity and consolidation in the left lung base, with scattered ground-glass airspace opacity in the right lung base.  -Stable on 4 L -Wean oxygen for saturation greater 92%   Lobar pneumonia -CT chest as discussed above -MRSA screen -doxycycline 100 mg BID starting 11/6   COPD exacerbation -Start Brovana -Start Pulmicort -Start Solu-Medrol -Start DuoNebs -COVID-19 PCR negative/influenza negative -Check viral respiratory panel--Parainfluenza virus    Cellulitis left lower extremity -See pictures below -Good palpable pulses -MRI leg and foot if no improvement or persistent fever   Acute on chronic renal failure--CKD 3a -Baseline creatinine 1.4-1.6 -Presented with serum creatinine 3.18 -Continue IV fluids -Holding furosemide temporarily   Uncontrolled diabetes mellitus type 2 with hyperglycemia -NovoLog sliding scale -05/23/2021 hemoglobin A1c 7.9 -Repeat A1c -Holding glipizide and tradjenta -start semglee 30 units daily, added novolog 10 units TID with meals CBG (last 3)  Recent Labs    02/13/23 0748 02/13/23 1108 02/13/23 1614  GLUCAP 313* 307* 377*    Acute metabolic encephalopathy -Secondary to infectious process -Improving with fluids and IV antibiotics -Holding gabapentin/riperdal temporarily   Chronic diabetic foot ulcer -See pictures below -Does not appear to be infected on exam -MRI foot if no improvement/persistent fever   Essential hypertension -Continue metoprolol tartrate -Holding amlodipine temporarily   Mixed hyperlipidemia -Continue statin   Bipolar disorder -Continue valproic acid, lithium, benztropine   DVT prophylaxis: enoxaparin Code Status: Full  Family Communication:  Disposition: return to SNF at DC possibly 11/7   Consultants:   Procedures:  Subjective: Pt says he wants to try ambulating more with PT today.   Objective: Vitals:   02/13/23 0821 02/13/23 0845 02/13/23 1330  02/13/23 1342  BP:  (!) 149/103 139/85   Pulse:  99    Resp:      Temp:   99 F (37.2 C)   TempSrc:   Oral   SpO2: 94%  94% 90%  Weight:      Height:        Intake/Output Summary (Last 24 hours) at 02/13/2023 1809 Last data filed at 02/13/2023 0600 Gross per 24 hour  Intake 1725.79 ml  Output 2550 ml  Net -824.21 ml   Filed Weights   02/12/23 0105 02/12/23 0516  Weight: 99.3 kg 100.4 kg   Examination:  General exam: Appears calm and comfortable  Respiratory system: Clear to auscultation. Respiratory effort normal. Cardiovascular system: normal S1 & S2 heard. No JVD, murmurs, rubs, gallops or clicks. No pedal edema. Gastrointestinal system: Abdomen is nondistended, soft and nontender. No organomegaly or masses felt. Normal bowel sounds heard. Central nervous system: Alert and oriented. No focal neurological deficits. Extremities: Symmetric 5 x 5 power. Skin: No rashes, lesions or ulcers. Psychiatry: Judgement and insight appear normal. Mood & affect appropriate.   Data Reviewed: I have personally reviewed following labs and imaging studies  CBC: Recent Labs  Lab 02/12/23 0020 02/12/23 0453 02/13/23 0439  WBC 16.5* 14.2* 9.7  NEUTROABS 11.7*  --   --   HGB 11.8* 11.0* 11.0*  HCT 37.3* 35.9* 34.9*  MCV 97.1 98.6 98.3  PLT 219 192 205    Basic Metabolic Panel: Recent Labs  Lab 02/12/23 0020 02/12/23 0453 02/13/23 0439  NA 132* 135 143  K 4.5 4.1 4.1  CL 101 104 108  CO2 19* 21* 24  GLUCOSE 249* 210* 344*  BUN 38* 33* 38*  CREATININE 2.18* 1.93* 1.84*  CALCIUM 9.2 8.9 9.0  MG  --  1.9 2.3  PHOS  --  3.8  --     CBG: Recent Labs  Lab 02/12/23 1653 02/12/23 2026 02/13/23 0748 02/13/23 1108 02/13/23 1614  GLUCAP 268* 306* 313* 307* 377*    Recent Results (from the past 240 hour(s))  Culture, blood (Routine X 2) w Reflex to ID Panel     Status: None (Preliminary result)   Collection Time: 02/12/23 12:20 AM   Specimen: Left Antecubital  Result  Value Ref Range Status   Specimen Description LEFT ANTECUBITAL  Final   Special Requests   Final    BOTTLES DRAWN AEROBIC AND ANAEROBIC Blood Culture results may not be optimal due to an excessive volume of blood received in culture bottles   Culture   Final    NO GROWTH 1 DAY Performed at [facility], [address], [place], [place] [address]    Report Status PENDING  Incomplete  Resp panel by RT-PCR (RSV, Flu A&B, Covid) Anterior Nasal Swab     Status: None   Collection Time: 02/12/23 12:37 AM   Specimen: Anterior Nasal Swab  Result Value Ref Range Status   SARS Coronavirus 2 by RT PCR NEGATIVE NEGATIVE Final    Comment: (NOTE) SARS-CoV-2 target nucleic acids are NOT DETECTED.  The SARS-CoV-2 RNA is generally detectable in upper respiratory specimens during the acute phase of infection. The lowest concentration of SARS-CoV-2 viral copies this assay can detect is 138 copies/mL. A negative result does not preclude SARS-Cov-2 infection and should not be used as the sole basis for treatment  or other patient management decisions. A negative result may occur with  improper specimen collection/handling, submission of specimen other than nasopharyngeal swab, presence of viral mutation(s) within the areas targeted by this assay, and inadequate number of viral copies(<138 copies/mL). A negative result must be combined with clinical observations, patient history, and epidemiological information. The expected result is Negative.  Fact Sheet for Patients:  BloggerCourse.com  Fact Sheet for Healthcare Providers:  SeriousBroker.it  This test is no t yet approved or cleared by the [place] FDA and  has been authorized for detection and/or diagnosis of SARS-CoV-2 by FDA under an Emergency Use Authorization (EUA). This EUA will remain  in effect (meaning this test can be used) for the duration of the COVID-19 declaration under Section  564(b)(1) of the Act, 21 U.S.C.section 360bbb-3(b)(1), unless the authorization is terminated  or revoked sooner.       Influenza A by PCR NEGATIVE NEGATIVE Final   Influenza B by PCR NEGATIVE NEGATIVE Final    Comment: (NOTE) The Xpert Xpress SARS-CoV-2/FLU/RSV plus assay is intended as an aid in the diagnosis of influenza from Nasopharyngeal swab specimens and should not be used as a sole basis for treatment. Nasal washings and aspirates are unacceptable for Xpert Xpress SARS-CoV-2/FLU/RSV testing.  Fact Sheet for Patients: BloggerCourse.com  Fact Sheet for Healthcare Providers: SeriousBroker.it  This test is not yet approved or cleared by the [place] FDA and has been authorized for detection and/or diagnosis of SARS-CoV-2 by FDA under an Emergency Use Authorization (EUA). This EUA will remain in effect (meaning this test can be used) for the duration of the COVID-19 declaration under Section 564(b)(1) of the Act, 21 U.S.C. section 360bbb-3(b)(1), unless the authorization is terminated or revoked.     Resp Syncytial Virus by PCR NEGATIVE NEGATIVE Final    Comment: (NOTE) Fact Sheet for Patients: BloggerCourse.com  Fact Sheet for Healthcare Providers: SeriousBroker.it  This test is not yet approved or cleared by the [place] FDA and has been authorized for detection and/or diagnosis of SARS-CoV-2 by FDA under an Emergency Use Authorization (EUA). This EUA will remain in effect (meaning this test can be used) for the duration of the COVID-19 declaration under Section 564(b)(1) of the Act, 21 U.S.C. section 360bbb-3(b)(1), unless the authorization is terminated or revoked.  Performed at [facility], [address] [place], [place] [address]   Blood Culture (routine x 2)     Status: None (Preliminary result)   Collection Time: 02/12/23  1:07 AM   Specimen:  BLOOD  Result Value Ref Range Status   Specimen Description BLOOD BLOOD RIGHT HAND  Final   Special Requests   Final    BOTTLES DRAWN AEROBIC AND ANAEROBIC Blood Culture results may not be optimal due to an excessive volume of blood received in culture bottles   Culture   Final    NO GROWTH 1 DAY Performed at [facility], [address], [place], [place] [address]    Report Status PENDING  Incomplete  Respiratory (~20 pathogens) panel by PCR     Status: Abnormal   Collection Time: 02/13/23  7:57 AM   Specimen: Nasopharyngeal Swab; Respiratory  Result Value Ref Range Status   Adenovirus NOT DETECTED NOT DETECTED Final   Coronavirus 229E NOT DETECTED NOT DETECTED Final    Comment: (NOTE) The Coronavirus on the Respiratory Panel, DOES NOT test for the novel  Coronavirus (2019 nCoV)    Coronavirus HKU1 NOT DETECTED NOT DETECTED Final   Coronavirus NL63  NOT DETECTED NOT DETECTED Final   Coronavirus OC43 NOT DETECTED NOT DETECTED Final   Metapneumovirus NOT DETECTED NOT DETECTED Final   Rhinovirus / Enterovirus NOT DETECTED NOT DETECTED Final   Influenza A NOT DETECTED NOT DETECTED Final   Influenza B NOT DETECTED NOT DETECTED Final   Parainfluenza Virus 1 NOT DETECTED NOT DETECTED Final   Parainfluenza Virus 2 NOT DETECTED NOT DETECTED Final   Parainfluenza Virus 3 NOT DETECTED NOT DETECTED Final   Parainfluenza Virus 4 DETECTED (A) NOT DETECTED Final   Respiratory Syncytial Virus NOT DETECTED NOT DETECTED Final   Bordetella pertussis NOT DETECTED NOT DETECTED Final   Bordetella Parapertussis NOT DETECTED NOT DETECTED Final   Chlamydophila pneumoniae NOT DETECTED NOT DETECTED Final   Mycoplasma pneumoniae NOT DETECTED NOT DETECTED Final    Comment: Performed at [facility], [address], [place], [place] [address]  MRSA Next Gen by PCR, Nasal     Status: None   Collection Time: 02/13/23  9:24 AM   Specimen: Nasal Mucosa; Nasal Swab  Result Value Ref Range Status   MRSA by  PCR Next Gen NOT DETECTED NOT DETECTED Final    Comment: Performed at [facility], [address] [place], [place] [address]     Radiology Studies: CT CHEST WO CONTRAST  Result Date: 02/12/2023 CLINICAL DATA:  Respiratory illness, fever, nondiagnostic x-ray EXAM: CT CHEST WITHOUT CONTRAST TECHNIQUE: Multidetector CT imaging of the chest was performed following the standard protocol without IV contrast. RADIATION DOSE REDUCTION: This exam was performed according to the departmental dose-optimization program which includes automated exposure control, adjustment of the mA and/or kV according to patient size and/or use of iterative reconstruction technique. COMPARISON:  11/22/2019 FINDINGS: Cardiovascular: Aortic atherosclerosis. Normal heart size. Left coronary artery calcifications. No pericardial effusion. Mediastinum/Nodes: No enlarged mediastinal, hilar, or axillary lymph nodes. Thyroid gland, trachea, and esophagus demonstrate no significant findings. Lungs/Pleura: Examination limited by breath motion artifact, particularly in the lung bases. Within this limitation, heterogeneous airspace opacity and consolidation in the left lung base, with scattered ground-glass airspace opacity in the right lung base (series 3, image 106). Mild paraseptal emphysema. No pleural effusion or pneumothorax. Upper Abdomen: No acute abnormality. Musculoskeletal: No chest wall abnormality. No acute osseous findings. IMPRESSION: 1. Examination limited by breath motion artifact, particularly in the lung bases. Within this limitation, heterogeneous airspace opacity and consolidation in the left lung base, with scattered ground-glass airspace opacity in the right lung base. Findings are consistent with infection or aspiration. Recommend at minimum radiographic follow-up to resolution to exclude underlying mass in the left lung base. Follow-up CT in 6-8 weeks preferred. 2. Emphysema. 3. Coronary artery disease. Aortic  Atherosclerosis (ICD10-I70.0) and Emphysema (ICD10-J43.9). Electronically Signed   By: Jearld Lesch M.D.   On: 02/12/2023 12:51   DG Chest Port 1 View  Result Date: 02/12/2023 CLINICAL DATA:  Possible sepsis. EXAM: PORTABLE CHEST 1 VIEW COMPARISON:  01/25/2022. FINDINGS: The heart size and mediastinal contours are within normal limits. Mild atelectasis is noted at the lung bases. No effusion or pneumothorax is seen. No acute osseous abnormality is identified. IMPRESSION: Mild atelectasis at the lung bases. Electronically Signed   By: Thornell Sartorius M.D.   On: 02/12/2023 00:40    Scheduled Meds:  amLODipine  5 mg Oral Daily   arformoterol  15 mcg Nebulization BID   atorvastatin  40 mg Oral Daily   azithromycin  500 mg Oral Daily   benztropine  1 mg Oral Daily  budesonide (PULMICORT) nebulizer solution  0.5 mg Nebulization BID   divalproex  1,000 mg Oral QHS   divalproex  500 mg Oral q morning   enoxaparin (LOVENOX) injection  40 mg Subcutaneous Q24H   feeding supplement (GLUCERNA SHAKE)  237 mL Oral TID BM   insulin aspart  0-15 Units Subcutaneous TID WC   insulin aspart  0-5 Units Subcutaneous QHS   insulin aspart  10 Units Subcutaneous TID WC   insulin glargine-yfgn  30 Units Subcutaneous Daily   ipratropium-albuterol  3 mL Nebulization Q6H   lithium carbonate  300 mg Oral BID WC   methylPREDNISolone (SOLU-MEDROL) injection  40 mg Intravenous Q12H   metoprolol tartrate  12.5 mg Oral BID   oxymetazoline  1 spray Each Nare BID   tamsulosin  0.4 mg Oral QPC breakfast   Continuous Infusions:  cefTRIAXone (ROCEPHIN)  IV 1 g (02/13/23 0858)   vancomycin 1,500 mg (02/13/23 0123)     LOS: 1 day   Time spent: 47 mins  Brandonn Capelli Laural Benes, MD How to contact the [facility] Attending or Consulting provider 7A - 7P or covering provider during after hours 7P -7A, for this patient?  Check the care team in [facility] and look for a) attending/consulting TRH provider listed and b) the [facility] team listed Log  into [email or web address] to find provider on call.  Locate the [facility] provider you are looking for under [facility] and page to a number that you can be directly reached. If you still have difficulty reaching the provider, please page the [facility] (Director on Call) for the Hospitalists listed on amion for assistance.  02/13/2023, 6:09 PM

## 2023-02-13 NOTE — Progress Notes (Signed)
Mobility Specialist Progress Note:    02/13/23 1445  Mobility  Activity Ambulated with assistance in hallway  Level of Assistance Contact guard assist, steadying assist  Assistive Device Front wheel walker  Distance Ambulated (ft) 45 ft  Range of Motion/Exercises Active;All extremities  Activity Response Tolerated well  Mobility Referral Yes  $Mobility charge 1 Mobility  Mobility Specialist Start Time (ACUTE ONLY) 1445  Mobility Specialist Stop Time (ACUTE ONLY) 1510  Mobility Specialist Time Calculation (min) (ACUTE ONLY) 25 min   Pt received in chair, eager for mobility. Required CGA to stand and ambulate with RW. Tolerated well, pt shaky when ambulating. Returned pt to room, left in chair. Alarm on, call bell in reach. All needs met.   Lawerance Bach Mobility Specialist Please contact via Special educational needs teacher or  Rehab office at [phone]

## 2023-02-13 NOTE — Evaluation (Addendum)
Physical Therapy Evaluation Patient Details Name: ACY ORSAK MRN: [id number] DOB: 04-01-1961 Today's Date: 02/13/2023  History of Present Illness  a 62 y.o. male with medical history significant of hypertension, hyperlipidemia, type 2 diabetes mellitus who presents to the emergency department from [facility] via EMS due to confusion and fever of 101F tonight.  Patient was not able to provide history possibly due to being confused, history was obtained from ED physician and ED medical record.  Per report, patient was walking located along the hallway which was unusual for patient.  Clinical Impression   Pt tolerated today's Physical Therapy Evaluation, well with mild c/o dizziness. Pt did have one bout of nausea which led to vomitting in to trashcan, observed dark, tinged material from emesis. Pt did have spaghetti for lunch. Nurse notified of nausea and consistency/coloration of emesis. Pt demonstrating decline in baseline functional status noted by increased guarding, cues and assistance during gait, functional transfers, and mobility due to muscle weakness, acute medical issues and balance deficits. Based upon these deficits/impairments, pt would benefit from skilled acute physical therapy services to address the above deficits and improve their functional status.Per chart review, pt lives in Skilled Nursing Facility, would recommend continuation of physical therapy services at discharge setting to enhance safety and return pt back to functional baseline.              If plan is discharge home, recommend the following: A little help with walking and/or transfers;A little help with bathing/dressing/bathroom   Can travel by private vehicle        Equipment Recommendations    Recommendations for Other Services       Functional Status Assessment Patient has had a recent decline in their functional status and demonstrates the ability to make significant improvements in function in a  reasonable and predictable amount of time.     Precautions / Restrictions Precautions Precautions: Fall Restrictions Weight Bearing Restrictions: No      Mobility  Bed Mobility Overal bed mobility: Modified Independent             General bed mobility comments: HOB elevated with slow labord BLE movement toward EOB. Slow to pushup from elevated HOB with LUE. Patient Response: Cooperative  Transfers Overall transfer level: Needs assistance Equipment used: Rolling walker (2 wheels) Transfers: Sit to/from Stand Sit to Stand: Contact guard assist, Min assist           General transfer comment: 1st sit/stand performed @ min assist with needing to sit back down due to dizziness. Given rest break. 2nd STS with CGA with cues for hand placement and RW mangement.    Ambulation/Gait Ambulation/Gait assistance: Contact guard assist Gait Distance (Feet): 5 Feet Assistive device: Rolling walker (2 wheels) Gait Pattern/deviations: Step-to pattern, Decreased step length - right, Decreased step length - left, Decreased stance time - right, Decreased stance time - left, Shuffle       General Gait Details: 95ft from EOB to recliner with 43ft forward stepping and 70ft backwards stepping to recliner. Shaky, tremor like movements in standing with small shuffling steps when ambulating, cues for RW management and larger steps. No carryover.  Stairs            Wheelchair Mobility     Tilt Bed Tilt Bed Patient Response: Cooperative  Modified Rankin (Stroke Patients Only)       Balance Overall balance assessment: Needs assistance Sitting-balance support: No upper extremity supported Sitting balance-Leahy Scale: Fair Sitting balance - Comments: fair/fair  sitting balance.   Standing balance support: Bilateral upper extremity supported, During functional activity, Reliant on assistive device for balance Standing balance-Leahy Scale: Poor Standing balance comment: Poor/poor,  reliant on RW, increased shaking in standing, poor balance reactions.                             Pertinent Vitals/Pain Pain Assessment Pain Assessment: No/denies pain    Home Living Family/patient expects to be discharged to:: Skilled nursing facility Living Arrangements: Group Home Available Help at Discharge: Available 24 hours/day Type of Home:  (SNF)         Home Layout: One level Home Equipment: Agricultural consultant (2 wheels);Shower seat      Prior Function               Mobility Comments: Pt reporting that from facility and walks @ mod I level with RW around facility ADLs Comments: Reports supervision level for all ADLs at facility.     Extremity/Trunk Assessment   Upper Extremity Assessment Upper Extremity Assessment: Overall WFL for tasks assessed    Lower Extremity Assessment Lower Extremity Assessment: Generalized weakness    Cervical / Trunk Assessment Cervical / Trunk Assessment: Kyphotic  Communication   Communication Communication: No apparent difficulties Cueing Techniques: Verbal cues;Gestural cues;Tactile cues  Cognition Arousal: Alert Behavior During Therapy: WFL for tasks assessed/performed Overall Cognitive Status: Within Functional Limits for tasks assessed                                          General Comments      Exercises     Assessment/Plan    PT Assessment Patient needs continued PT services  PT Problem List Decreased strength;Decreased activity tolerance;Decreased balance;Decreased mobility       PT Treatment Interventions Gait training;Functional mobility training;Therapeutic activities;Therapeutic exercise;Balance training;DME instruction;Neuromuscular re-education    PT Goals (Current goals can be found in the Care Plan section)  Acute Rehab PT Goals Patient Stated Goal: go home, see parents, and go to church PT Goal Formulation: With patient Time For Goal Achievement:  02/27/23 Potential to Achieve Goals: Good    Frequency Min 3X/week     Co-evaluation               AM-PAC PT "6 Clicks" Mobility  Outcome Measure Help needed turning from your back to your side while in a flat bed without using bedrails?: None Help needed moving from lying on your back to sitting on the side of a flat bed without using bedrails?: A Little Help needed moving to and from a bed to a chair (including a wheelchair)?: A Little Help needed standing up from a chair using your arms (e.g., wheelchair or bedside chair)?: A Little Help needed to walk in hospital room?: A Little Help needed climbing 3-5 steps with a railing? : A Lot 6 Click Score: 18    End of Session Equipment Utilized During Treatment: Gait belt Activity Tolerance: Patient tolerated treatment well Patient left: in chair;with call bell/phone within reach;with chair alarm set Nurse Communication: Mobility status PT Visit Diagnosis: Unsteadiness on feet (R26.81);Other abnormalities of gait and mobility (R26.89);Muscle weakness (generalized) (M62.81)    Time: 6045-4098 PT Time Calculation (min) (ACUTE ONLY): 25 min   Charges:   PT Evaluation $PT Eval Low Complexity: 1 Low PT Treatments $Therapeutic Activity: 8-22 mins PT  General Charges $$ ACUTE PT VISIT: 1 Visit         Nelida Meuse PT, DPT Physical Therapist with Tomasa Hosteller [facility] [phone] office  Nelida Meuse 02/13/2023, 1:14 PM

## 2023-02-13 NOTE — Inpatient Diabetes Management (Signed)
Inpatient Diabetes Program Recommendations  AACE/ADA: New Consensus Statement on Inpatient Glycemic Control (2015)  Target Ranges:  Prepandial:   less than 140 mg/dL      Peak postprandial:   less than 180 mg/dL (1-2 hours)      Critically ill patients:  140 - 180 mg/dL    Latest Reference Range & Units 02/12/23 07:19 02/12/23 11:13 02/12/23 16:53 02/12/23 20:26  Glucose-Capillary 70 - 99 mg/dL 244 (H) 010 (H)  8 units Novolog @1255   30 units Semglee @1048  268 (H)  8 units Novolog  306 (H)  4 units Novolog   (H): Data is abnormally high  Latest Reference Range & Units 02/13/23 07:48  Glucose-Capillary 70 - 99 mg/dL 272 (H)  (H): Data is abnormally high     Home DM Meds: Glipizide 5 mg daily        Lantus 60 units at bedtime        Tradjenta 5 mg daily        Metformin 500 mg BID  Current Orders: Semglee 30 units daily     Novolog Moderate Correction Scale/ SSI (0-15 units) TID AC + HS     MD- Note pt started on IV Solumedrol 60 mg BID yest AM  CBG 313 this AM  Please consider:  1. Increase Semglee to 45 units Daily (75% home dose)  2. Start Novolog Meal Coverage: Novolog 6 units TID with meals HOLD if pt NPO HOLD if pt eats <50% meals    --Will follow patient during hospitalization--  Ambrose Finland RN, MSN, CDCES Diabetes Coordinator Inpatient Glycemic Control Team Team Pager: [phone] (8a-5p)

## 2023-02-14 DIAGNOSIS — N179 Acute kidney failure, unspecified: Secondary | ICD-10-CM | POA: Diagnosis not present

## 2023-02-14 DIAGNOSIS — G9341 Metabolic encephalopathy: Secondary | ICD-10-CM | POA: Diagnosis not present

## 2023-02-14 DIAGNOSIS — E782 Mixed hyperlipidemia: Secondary | ICD-10-CM | POA: Diagnosis not present

## 2023-02-14 DIAGNOSIS — L03116 Cellulitis of left lower limb: Secondary | ICD-10-CM | POA: Diagnosis not present

## 2023-02-14 LAB — GLUCOSE, CAPILLARY
Glucose-Capillary: 160 mg/dL — ABNORMAL HIGH (ref 70–99)
Glucose-Capillary: 237 mg/dL — ABNORMAL HIGH (ref 70–99)
Glucose-Capillary: 385 mg/dL — ABNORMAL HIGH (ref 70–99)
Glucose-Capillary: 414 mg/dL — ABNORMAL HIGH (ref 70–99)

## 2023-02-14 MED ORDER — PREDNISONE 20 MG PO TABS
30.0000 mg | ORAL_TABLET | Freq: Every day | ORAL | Status: DC
  Administered 2023-02-14: 30 mg via ORAL
  Filled 2023-02-14 (×2): qty 2

## 2023-02-14 MED ORDER — INSULIN GLARGINE-YFGN 100 UNIT/ML ~~LOC~~ SOLN
55.0000 [IU] | Freq: Every day | SUBCUTANEOUS | Status: DC
  Administered 2023-02-14: 55 [IU] via SUBCUTANEOUS
  Filled 2023-02-14 (×2): qty 0.55

## 2023-02-14 MED ORDER — INSULIN ASPART 100 UNIT/ML IJ SOLN
20.0000 [IU] | Freq: Once | INTRAMUSCULAR | Status: AC
  Administered 2023-02-14: 20 [IU] via SUBCUTANEOUS

## 2023-02-14 MED ORDER — INSULIN ASPART 100 UNIT/ML IJ SOLN: 12.0000 [IU] | Freq: Three times a day (TID) | INTRAMUSCULAR | Status: DC

## 2023-02-14 NOTE — TOC Progression Note (Addendum)
Transition of Care [facility]) - Progression Note    Patient Details  Name: Scott [name] MRN: [id number] Date of Birth: Aug 31, 1960  Transition of Care [facility]) CM/SW Contact  Villa Herb, [place] Phone Number: 02/14/2023, 8:54 AM  Clinical Narrative:    CSW spoke with pts legal guardian about SNF placement recommendation. She is agreeable to SNF placement and asked that CSW send referral to [facility] first. CSW to complete referral and send out for review. TOC to follow.   Addendum: CSW updated that [facility] is able to offer a bed offer, CSW spoke to [employer] with [facility] who states they can accept pt tomorrow for admission. TOC to follow.   Expected Discharge Plan: Assisted Living Barriers to Discharge: Continued Medical Work up  Expected Discharge Plan and Services In-house Referral: Clinical Social Work Discharge Planning Services: CM Consult Post Acute Care Choice: Resumption of Svcs/PTA Provider Living arrangements for the past 2 months: Assisted Living Facility                                       Social Determinants of Health (SDOH) Interventions SDOH Screenings   Food Insecurity: No Food Insecurity (02/12/2023)  Housing: Low Risk  (02/12/2023)  Transportation Needs: No Transportation Needs (02/12/2023)  Utilities: Not At Risk (02/12/2023)  Tobacco Use: Low Risk  (01/14/2023)    Readmission Risk Interventions    02/12/2023   10:56 AM  Readmission Risk Prevention Plan  Transportation Screening Complete  HRI or Home Care Consult Complete  Social Work Consult for Recovery Care Planning/Counseling Complete  Palliative Care Screening Not Applicable  Medication Review Oceanographer) Complete

## 2023-02-14 NOTE — Progress Notes (Signed)
PROGRESS NOTE   Scott [name]  ZOX:[id number] DOB: [birth year] DOA: 02/12/2023 PCP: Benetta Spar, MD   Chief Complaint  Patient presents with   Fever   Level of care: Med-Surg  Brief Admission History:  62 year old male with a history of hypertension, diabetes mellitus type 2, hyperlipidemia, bipolar disorder, tobacco abuse (quit 1 yr ago), COPD, and TBI presenting from [facility] with confusion and fever.  Apparently, the patient was wandering the halls and taking his clothes off and acting strangely.  His facility noted the patient to have a fever of 101.0 F.  He was taken the emergency department for further evaluation. In the ED, the patient was confused and not able to provide any history. On the morning of 02/12/2023, the patient was more awake and alert.  He states that he has been short of breath for about a week with a nonproductive cough.  He denies any chest pain, hemoptysis, nausea, vomiting or diarrhea, abdominal pain, dysuria.  He states that he has had his left foot ulcer for at least 3 months.  He denies any worsening drainage.  He has noted some erythema on his left leg dorsal surface over the past few days.  He states that he has been following a "foot doctor" for his foot ulcer.  In the ED, the patient was febrile to 102.2 F and tachycardic.  He was hemodynamically stable.  Oxygen saturation was 89% on room air.  He was placed on 4 L with saturation up to 96%.  The patient was started vancomycin, cefepime, and metronidazole in the ED and given fluid boluses as appropriate for sepsis protocol.    Assessment and Plan:  Severe sepsis -Present on admission -Secondary to cellulitis and pulmonary source -Lactic acid peaked 3.3>> 1.6 -Check PCT 0.48 -initially treated with vancomycin and ceftriaxone with plan to de-escalate 11/6   Acute respiratory failure with hypoxia -Personally reviewed chest x-ray--increased interstitial markings without frank  consolidation -11/5 CT chest--airspace opacity and consolidation in the left lung base, with scattered ground-glass airspace opacity in the right lung base.  -Stable on 4 L -Wean oxygen for saturation greater 92%   Lobar pneumonia -CT chest as discussed above -MRSA screen -doxycycline 100 mg BID starting 11/6   COPD exacerbation -Start Brovana -Start Pulmicort -Start Solu-Medrol -Start DuoNebs -COVID-19 PCR negative/influenza negative -Check viral respiratory panel--Parainfluenza virus    Cellulitis left lower extremity -See pictures below -Good palpable pulses -MRI leg and foot if no improvement or persistent fever   Acute on chronic renal failure--CKD 3a -Baseline creatinine 1.4-1.6 -Presented with serum creatinine 3.18 -Continue IV fluids -Holding furosemide temporarily   Uncontrolled diabetes mellitus type 2 with hyperglycemia -NovoLog sliding scale -05/23/2021 hemoglobin A1c 7.9 -Repeat A1c -Holding glipizide and tradjenta -start semglee 30 units daily, added novolog 10 units TID with meals CBG (last 3)  Recent Labs    02/14/23 0304 02/14/23 0726 02/14/23 1123  GLUCAP 160* 237* 385*    Acute metabolic encephalopathy -Secondary to infectious process -Improving with fluids and IV antibiotics -Holding gabapentin/riperdal temporarily   Chronic diabetic foot ulcer -See pictures below -Does not appear to be infected on exam -MRI foot if no improvement/persistent fever   Essential hypertension -Continue metoprolol tartrate -Holding amlodipine temporarily   Mixed hyperlipidemia -Continue statin   Bipolar disorder -Continue valproic acid, lithium, benztropine   DVT prophylaxis: enoxaparin Code Status: Full  Family Communication:  Disposition: SNF at DC anticipate 11/8   Consultants:   Procedures:    Subjective: Pt  agreeable to going to SNF for rehab.    Objective: Vitals:   02/13/23 2112 02/14/23 0305 02/14/23 0908 02/14/23 1145  BP: 118/86  (!) 153/82 (!) 160/91 137/82  Pulse: 100 90 (!) 101 86  Resp: 20 (!) 22 18   Temp: 98 F (36.7 C) 98.4 F (36.9 C) 97.7 F (36.5 C) 98.7 F (37.1 C)  TempSrc:   Oral Oral  SpO2: 91% 92% 91% 97%  Weight:      Height:        Intake/Output Summary (Last 24 hours) at 02/14/2023 1441 Last data filed at 02/14/2023 0500 Gross per 24 hour  Intake 480 ml  Output 3100 ml  Net -2620 ml   Filed Weights   02/12/23 0105 02/12/23 0516  Weight: 99.3 kg 100.4 kg   Examination:  General exam: Appears calm and comfortable  Respiratory system: Clear to auscultation. Respiratory effort normal. Cardiovascular system: normal S1 & S2 heard. No JVD, murmurs, rubs, gallops or clicks. No pedal edema. Gastrointestinal system: Abdomen is nondistended, soft and nontender. No organomegaly or masses felt. Normal bowel sounds heard. Central nervous system: Alert and oriented. No focal neurological deficits. Extremities: Symmetric 5 x 5 power. Skin: No rashes, lesions or ulcers. Psychiatry: Judgement and insight appear normal. Mood & affect appropriate.   Data Reviewed: I have personally reviewed following labs and imaging studies  CBC: Recent Labs  Lab 02/12/23 0020 02/12/23 0453 02/13/23 0439  WBC 16.5* 14.2* 9.7  NEUTROABS 11.7*  --   --   HGB 11.8* 11.0* 11.0*  HCT 37.3* 35.9* 34.9*  MCV 97.1 98.6 98.3  PLT 219 192 205    Basic Metabolic Panel: Recent Labs  Lab 02/12/23 0020 02/12/23 0453 02/13/23 0439  NA 132* 135 143  K 4.5 4.1 4.1  CL 101 104 108  CO2 19* 21* 24  GLUCOSE 249* 210* 344*  BUN 38* 33* 38*  CREATININE 2.18* 1.93* 1.84*  CALCIUM 9.2 8.9 9.0  MG  --  1.9 2.3  PHOS  --  3.8  --     CBG: Recent Labs  Lab 02/13/23 1614 02/13/23 2115 02/14/23 0304 02/14/23 0726 02/14/23 1123  GLUCAP 377* 379* 160* 237* 385*    Recent Results (from the past 240 hour(s))  Culture, blood (Routine X 2) w Reflex to ID Panel     Status: None (Preliminary result)   Collection  Time: 02/12/23 12:20 AM   Specimen: Left Antecubital  Result Value Ref Range Status   Specimen Description LEFT ANTECUBITAL  Final   Special Requests   Final    BOTTLES DRAWN AEROBIC AND ANAEROBIC Blood Culture results may not be optimal due to an excessive volume of blood received in culture bottles   Culture   Final    NO GROWTH 1 DAY Performed at [facility], [address] [place], [place] [address]    Report Status PENDING  Incomplete  Resp panel by RT-PCR (RSV, Flu A&B, Covid) Anterior Nasal Swab     Status: None   Collection Time: 02/12/23 12:37 AM   Specimen: Anterior Nasal Swab  Result Value Ref Range Status   SARS Coronavirus 2 by RT PCR NEGATIVE NEGATIVE Final    Comment: (NOTE) SARS-CoV-2 target nucleic acids are NOT DETECTED.  The SARS-CoV-2 RNA is generally detectable in upper respiratory specimens during the acute phase of infection. The lowest concentration of SARS-CoV-2 viral copies this assay can detect is 138 copies/mL. A negative result does not preclude SARS-Cov-2 infection and should not  be used as the sole basis for treatment or other patient management decisions. A negative result may occur with  improper specimen collection/handling, submission of specimen other than nasopharyngeal swab, presence of viral mutation(s) within the areas targeted by this assay, and inadequate number of viral copies(<138 copies/mL). A negative result must be combined with clinical observations, patient history, and epidemiological information. The expected result is Negative.  Fact Sheet for Patients:  BloggerCourse.com  Fact Sheet for Healthcare Providers:  SeriousBroker.it  This test is no t yet approved or cleared by the [place] FDA and  has been authorized for detection and/or diagnosis of SARS-CoV-2 by FDA under an Emergency Use Authorization (EUA). This EUA will remain  in effect (meaning this test can be used)  for the duration of the COVID-19 declaration under Section 564(b)(1) of the Act, 21 U.S.C.section 360bbb-3(b)(1), unless the authorization is terminated  or revoked sooner.       Influenza A by PCR NEGATIVE NEGATIVE Final   Influenza B by PCR NEGATIVE NEGATIVE Final    Comment: (NOTE) The Xpert Xpress SARS-CoV-2/FLU/RSV plus assay is intended as an aid in the diagnosis of influenza from Nasopharyngeal swab specimens and should not be used as a sole basis for treatment. Nasal washings and aspirates are unacceptable for Xpert Xpress SARS-CoV-2/FLU/RSV testing.  Fact Sheet for Patients: BloggerCourse.com  Fact Sheet for Healthcare Providers: SeriousBroker.it  This test is not yet approved or cleared by the [place] FDA and has been authorized for detection and/or diagnosis of SARS-CoV-2 by FDA under an Emergency Use Authorization (EUA). This EUA will remain in effect (meaning this test can be used) for the duration of the COVID-19 declaration under Section 564(b)(1) of the Act, 21 U.S.C. section 360bbb-3(b)(1), unless the authorization is terminated or revoked.     Resp Syncytial Virus by PCR NEGATIVE NEGATIVE Final    Comment: (NOTE) Fact Sheet for Patients: BloggerCourse.com  Fact Sheet for Healthcare Providers: SeriousBroker.it  This test is not yet approved or cleared by the [place] FDA and has been authorized for detection and/or diagnosis of SARS-CoV-2 by FDA under an Emergency Use Authorization (EUA). This EUA will remain in effect (meaning this test can be used) for the duration of the COVID-19 declaration under Section 564(b)(1) of the Act, 21 U.S.C. section 360bbb-3(b)(1), unless the authorization is terminated or revoked.  Performed at [facility], [address], [place], [place] [address]   Blood Culture (routine x 2)     Status: None (Preliminary  result)   Collection Time: 02/12/23  1:07 AM   Specimen: BLOOD  Result Value Ref Range Status   Specimen Description BLOOD BLOOD RIGHT HAND  Final   Special Requests   Final    BOTTLES DRAWN AEROBIC AND ANAEROBIC Blood Culture results may not be optimal due to an excessive volume of blood received in culture bottles   Culture   Final    NO GROWTH 1 DAY Performed at [facility], [address] [place], [place] [address]    Report Status PENDING  Incomplete  Respiratory (~20 pathogens) panel by PCR     Status: Abnormal   Collection Time: 02/13/23  7:57 AM   Specimen: Nasopharyngeal Swab; Respiratory  Result Value Ref Range Status   Adenovirus NOT DETECTED NOT DETECTED Final   Coronavirus 229E NOT DETECTED NOT DETECTED Final    Comment: (NOTE) The Coronavirus on the Respiratory Panel, DOES NOT test for the novel  Coronavirus (2019 nCoV)    Coronavirus HKU1 NOT  DETECTED NOT DETECTED Final   Coronavirus NL63 NOT DETECTED NOT DETECTED Final   Coronavirus OC43 NOT DETECTED NOT DETECTED Final   Metapneumovirus NOT DETECTED NOT DETECTED Final   Rhinovirus / Enterovirus NOT DETECTED NOT DETECTED Final   Influenza A NOT DETECTED NOT DETECTED Final   Influenza B NOT DETECTED NOT DETECTED Final   Parainfluenza Virus 1 NOT DETECTED NOT DETECTED Final   Parainfluenza Virus 2 NOT DETECTED NOT DETECTED Final   Parainfluenza Virus 3 NOT DETECTED NOT DETECTED Final   Parainfluenza Virus 4 DETECTED (A) NOT DETECTED Final   Respiratory Syncytial Virus NOT DETECTED NOT DETECTED Final   Bordetella pertussis NOT DETECTED NOT DETECTED Final   Bordetella Parapertussis NOT DETECTED NOT DETECTED Final   Chlamydophila pneumoniae NOT DETECTED NOT DETECTED Final   Mycoplasma pneumoniae NOT DETECTED NOT DETECTED Final    Comment: Performed at [facility], [address], [place], [place] [address]  MRSA Next Gen by PCR, Nasal     Status: None   Collection Time: 02/13/23  9:24 AM   Specimen: Nasal  Mucosa; Nasal Swab  Result Value Ref Range Status   MRSA by PCR Next Gen NOT DETECTED NOT DETECTED Final    Comment: Performed at [facility], [address] [place], [place] [address]     Radiology Studies: No results found.  Scheduled Meds:  amLODipine  5 mg Oral Daily   arformoterol  15 mcg Nebulization BID   atorvastatin  40 mg Oral Daily   benztropine  1 mg Oral BID   budesonide (PULMICORT) nebulizer solution  0.5 mg Nebulization BID   divalproex  1,000 mg Oral QHS   divalproex  500 mg Oral q morning   doxycycline  100 mg Oral Q12H   enoxaparin (LOVENOX) injection  40 mg Subcutaneous Q24H   feeding supplement (GLUCERNA SHAKE)  237 mL Oral TID BM   insulin aspart  0-15 Units Subcutaneous TID WC   insulin aspart  0-5 Units Subcutaneous QHS   insulin aspart  10 Units Subcutaneous TID WC   insulin glargine-yfgn  55 Units Subcutaneous QHS   ipratropium-albuterol  3 mL Nebulization BID   lithium carbonate  300 mg Oral BID WC   metoprolol tartrate  12.5 mg Oral BID   oxymetazoline  1 spray Each Nare BID   predniSONE  30 mg Oral Q breakfast   risperiDONE  2 mg Oral Daily   tamsulosin  0.4 mg Oral QPC breakfast   traZODone  50 mg Oral QHS   Continuous Infusions:     LOS: 2 days   Time spent: 44 mins  Scott Boger Laural Benes, MD How to contact the [facility] Attending or Consulting provider 7A - 7P or covering provider during after hours 7P -7A, for this patient?  Check the care team in [facility] and look for a) attending/consulting TRH provider listed and b) the [facility] team listed Log into [email or web address] to find provider on call.  Locate the [facility] provider you are looking for under [facility] and page to a number that you can be directly reached. If you still have difficulty reaching the provider, please page the [facility] (Director on Call) for the Hospitalists listed on amion for assistance.  02/14/2023, 2:41 PM
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
## 2023-02-14 NOTE — Plan of Care (Signed)
  Problem: Education: Goal: Knowledge of General Education information will improve Description: Including pain rating scale, medication(s)/side effects and non-pharmacologic comfort measures Outcome: Progressing   Problem: Clinical Measurements: Goal: Diagnostic test results will improve Outcome: Progressing   Problem: Activity: Goal: Risk for activity intolerance will decrease Outcome: Progressing   Problem: Nutrition: Goal: Adequate nutrition will be maintained Outcome: Progressing   Problem: Pain Management: Goal: General experience of comfort will improve Outcome: Progressing   Problem: Safety: Goal: Ability to remain free from injury will improve Outcome: Progressing   Problem: Nutritional: Goal: Maintenance of adequate nutrition will improve Outcome: Progressing

## 2023-02-14 NOTE — Progress Notes (Signed)
Physical Therapy Treatment Patient Details Name: Scott [name] MRN: [id number] DOB: Aug 13, 1960 Today's Date: 02/14/2023   History of Present Illness a 62 y.o. male with medical history significant of hypertension, hyperlipidemia, type 2 diabetes mellitus who presents to the emergency department from [facility] via EMS due to confusion and fever of 101F tonight.  Patient was not able to provide history possibly due to being confused, history was obtained from ED physician and ED medical record.  Per report, patient was walking located along the hallway which was unusual for patient.    PT Comments  Patient demonstrates good return for sitting up at bedside with [facility] flat, fair/good return for completing BLE exercises while seated at bedside requiring verbal cueing, tolerated ambulating in room with slow labored cadence and limited mostly due to c/o fatigue/generalized weakness.  Patient SpO2 dropped from 93% to 87% walking on room air, at 89-91% on 2 LPM and tolerated sitting up in chair with SpO2 at 93% while on 2 LPM - nurse notified.  Patient will benefit from continued skilled physical therapy in hospital and recommended venue below to increase strength, balance, endurance for safe ADLs and gait.     If plan is discharge home, recommend the following: A little help with bathing/dressing/bathroom;Help with stairs or ramp for entrance;Assistance with cooking/housework;A lot of help with walking and/or transfers   Can travel by private vehicle     Yes  Equipment Recommendations  None recommended by PT    Recommendations for Other Services       Precautions / Restrictions Precautions Precautions: Fall Restrictions Weight Bearing Restrictions: No     Mobility  Bed Mobility Overal bed mobility: Modified Independent             General bed mobility comments: hed of bed flat    Transfers Overall transfer level: Needs assistance Equipment used: Rolling walker (2  wheels) Transfers: Sit to/from Stand, Bed to chair/wheelchair/BSC Sit to Stand: Contact guard assist   Step pivot transfers: Min assist, Contact guard assist       General transfer comment: labored movement without loss of balance    Ambulation/Gait Ambulation/Gait assistance: Contact guard assist, Min assist Gait Distance (Feet): 30 Feet Assistive device: Rolling walker (2 wheels) Gait Pattern/deviations: Decreased step length - right, Decreased step length - left, Decreased stride length, Ataxic Gait velocity: decreased     General Gait Details: slow labored cadence with short step/stride length with ataxic like gait without loss of balance using RW, on room air with SpO2 dropping from 93% to 87%, put on 2 LPM with SpO2 at 89-91%, limited mostly due to fatigue and mild SOB   Stairs             Wheelchair Mobility     Tilt Bed    Modified Rankin (Stroke Patients Only)       Balance Overall balance assessment: Needs assistance Sitting-balance support: Feet supported, No upper extremity supported Sitting balance-Leahy Scale: Fair Sitting balance - Comments: fair/good seated at EOB   Standing balance support: Reliant on assistive device for balance, During functional activity, Bilateral upper extremity supported Standing balance-Leahy Scale: Fair Standing balance comment: using RW                            Cognition Arousal: Alert Behavior During Therapy: WFL for tasks assessed/performed Overall Cognitive Status: Within Functional Limits for tasks assessed  Exercises General Exercises - Lower Extremity Ankle Circles/Pumps: Seated, AROM, Strengthening, Both, 10 reps Long Arc Quad: Seated, AROM, Strengthening, Both, 10 reps Hip Flexion/Marching: Seated, AROM, Strengthening, Both, 10 reps    General Comments        Pertinent Vitals/Pain Pain Assessment Pain Assessment: No/denies pain     Home Living                          Prior Function            PT Goals (current goals can now be found in the care plan section) Acute Rehab PT Goals Patient Stated Goal: return home PT Goal Formulation: With patient Time For Goal Achievement: 02/27/23 Potential to Achieve Goals: Good Progress towards PT goals: Progressing toward goals    Frequency    Min 3X/week      PT Plan      Co-evaluation              AM-PAC PT "6 Clicks" Mobility   Outcome Measure  Help needed turning from your back to your side while in a flat bed without using bedrails?: None Help needed moving from lying on your back to sitting on the side of a flat bed without using bedrails?: None Help needed moving to and from a bed to a chair (including a wheelchair)?: A Little Help needed standing up from a chair using your arms (e.g., wheelchair or bedside chair)?: A Little Help needed to walk in hospital room?: A Lot Help needed climbing 3-5 steps with a railing? : A Lot 6 Click Score: 18    End of Session Equipment Utilized During Treatment: Oxygen Activity Tolerance: Patient tolerated treatment well;Patient limited by fatigue Patient left: in chair;with call bell/phone within reach;with chair alarm set Nurse Communication: Mobility status PT Visit Diagnosis: Unsteadiness on feet (R26.81);Other abnormalities of gait and mobility (R26.89);Muscle weakness (generalized) (M62.81)     Time: 1610-9604 PT Time Calculation (min) (ACUTE ONLY): 28 min  Charges:    $Gait Training: 8-22 mins $Therapeutic Exercise: 8-22 mins PT General Charges $$ ACUTE PT VISIT: 1 Visit                     11:12 AM, 02/14/23 Ocie Bob, MPT Physical Therapist with [facility] [phone] office [phone] mobile phone

## 2023-02-14 NOTE — Plan of Care (Signed)
  Problem: Pain Management: Goal: General experience of comfort will improve Outcome: Progressing

## 2023-02-15 DIAGNOSIS — R770 Abnormality of albumin: Secondary | ICD-10-CM | POA: Diagnosis not present

## 2023-02-15 DIAGNOSIS — E785 Hyperlipidemia, unspecified: Secondary | ICD-10-CM | POA: Diagnosis not present

## 2023-02-15 DIAGNOSIS — J9601 Acute respiratory failure with hypoxia: Secondary | ICD-10-CM | POA: Diagnosis not present

## 2023-02-15 DIAGNOSIS — F319 Bipolar disorder, unspecified: Secondary | ICD-10-CM | POA: Diagnosis not present

## 2023-02-15 DIAGNOSIS — L97509 Non-pressure chronic ulcer of other part of unspecified foot with unspecified severity: Secondary | ICD-10-CM | POA: Diagnosis not present

## 2023-02-15 DIAGNOSIS — F329 Major depressive disorder, single episode, unspecified: Secondary | ICD-10-CM | POA: Diagnosis not present

## 2023-02-15 DIAGNOSIS — F330 Major depressive disorder, recurrent, mild: Secondary | ICD-10-CM | POA: Diagnosis not present

## 2023-02-15 DIAGNOSIS — E119 Type 2 diabetes mellitus without complications: Secondary | ICD-10-CM | POA: Diagnosis not present

## 2023-02-15 DIAGNOSIS — G4700 Insomnia, unspecified: Secondary | ICD-10-CM | POA: Diagnosis not present

## 2023-02-15 DIAGNOSIS — R279 Unspecified lack of coordination: Secondary | ICD-10-CM | POA: Diagnosis not present

## 2023-02-15 DIAGNOSIS — I10 Essential (primary) hypertension: Secondary | ICD-10-CM | POA: Diagnosis not present

## 2023-02-15 DIAGNOSIS — J181 Lobar pneumonia, unspecified organism: Secondary | ICD-10-CM | POA: Diagnosis not present

## 2023-02-15 DIAGNOSIS — G9341 Metabolic encephalopathy: Secondary | ICD-10-CM | POA: Diagnosis not present

## 2023-02-15 DIAGNOSIS — L03116 Cellulitis of left lower limb: Secondary | ICD-10-CM | POA: Diagnosis not present

## 2023-02-15 DIAGNOSIS — E1169 Type 2 diabetes mellitus with other specified complication: Secondary | ICD-10-CM | POA: Diagnosis not present

## 2023-02-15 DIAGNOSIS — F331 Major depressive disorder, recurrent, moderate: Secondary | ICD-10-CM | POA: Diagnosis not present

## 2023-02-15 DIAGNOSIS — E46 Unspecified protein-calorie malnutrition: Secondary | ICD-10-CM | POA: Diagnosis not present

## 2023-02-15 DIAGNOSIS — M6281 Muscle weakness (generalized): Secondary | ICD-10-CM | POA: Diagnosis not present

## 2023-02-15 DIAGNOSIS — E1165 Type 2 diabetes mellitus with hyperglycemia: Secondary | ICD-10-CM | POA: Diagnosis not present

## 2023-02-15 DIAGNOSIS — R262 Difficulty in walking, not elsewhere classified: Secondary | ICD-10-CM | POA: Diagnosis not present

## 2023-02-15 DIAGNOSIS — F70 Mild intellectual disabilities: Secondary | ICD-10-CM | POA: Diagnosis not present

## 2023-02-15 DIAGNOSIS — F419 Anxiety disorder, unspecified: Secondary | ICD-10-CM | POA: Diagnosis not present

## 2023-02-15 DIAGNOSIS — E782 Mixed hyperlipidemia: Secondary | ICD-10-CM | POA: Diagnosis not present

## 2023-02-15 DIAGNOSIS — E669 Obesity, unspecified: Secondary | ICD-10-CM | POA: Diagnosis not present

## 2023-02-15 DIAGNOSIS — E872 Acidosis, unspecified: Secondary | ICD-10-CM | POA: Diagnosis not present

## 2023-02-15 DIAGNOSIS — E11621 Type 2 diabetes mellitus with foot ulcer: Secondary | ICD-10-CM | POA: Diagnosis not present

## 2023-02-15 DIAGNOSIS — Z794 Long term (current) use of insulin: Secondary | ICD-10-CM | POA: Diagnosis not present

## 2023-02-15 DIAGNOSIS — J9620 Acute and chronic respiratory failure, unspecified whether with hypoxia or hypercapnia: Secondary | ICD-10-CM | POA: Diagnosis not present

## 2023-02-15 DIAGNOSIS — R1312 Dysphagia, oropharyngeal phase: Secondary | ICD-10-CM | POA: Diagnosis not present

## 2023-02-15 DIAGNOSIS — N189 Chronic kidney disease, unspecified: Secondary | ICD-10-CM | POA: Diagnosis not present

## 2023-02-15 DIAGNOSIS — R6520 Severe sepsis without septic shock: Secondary | ICD-10-CM | POA: Diagnosis not present

## 2023-02-15 DIAGNOSIS — L97522 Non-pressure chronic ulcer of other part of left foot with fat layer exposed: Secondary | ICD-10-CM | POA: Diagnosis not present

## 2023-02-15 DIAGNOSIS — R251 Tremor, unspecified: Secondary | ICD-10-CM | POA: Diagnosis not present

## 2023-02-15 DIAGNOSIS — A419 Sepsis, unspecified organism: Secondary | ICD-10-CM | POA: Diagnosis not present

## 2023-02-15 DIAGNOSIS — J449 Chronic obstructive pulmonary disease, unspecified: Secondary | ICD-10-CM | POA: Diagnosis not present

## 2023-02-15 DIAGNOSIS — N1831 Chronic kidney disease, stage 3a: Secondary | ICD-10-CM | POA: Diagnosis not present

## 2023-02-15 DIAGNOSIS — J441 Chronic obstructive pulmonary disease with (acute) exacerbation: Secondary | ICD-10-CM | POA: Diagnosis not present

## 2023-02-15 LAB — GLUCOSE, CAPILLARY
Glucose-Capillary: 255 mg/dL — ABNORMAL HIGH (ref 70–99)
Glucose-Capillary: 276 mg/dL — ABNORMAL HIGH (ref 70–99)
Glucose-Capillary: 260 mg/dL — ABNORMAL HIGH (ref 70–99)
Glucose-Capillary: 317 mg/dL — ABNORMAL HIGH (ref 70–99)

## 2023-02-15 MED ORDER — DOXYCYCLINE HYCLATE 100 MG PO TABS: 100.0000 mg | ORAL_TABLET | Freq: Two times a day (BID) | ORAL | Status: AC

## 2023-02-15 MED ORDER — IPRATROPIUM-ALBUTEROL 0.5-2.5 (3) MG/3ML IN SOLN: 3.0000 mL | RESPIRATORY_TRACT | Status: DC | PRN

## 2023-02-15 MED ORDER — RISPERIDONE 2 MG PO TABS: 2.0000 mg | ORAL_TABLET | Freq: Every day | ORAL | Status: AC

## 2023-02-15 MED ORDER — PREDNISONE 20 MG PO TABS
20.0000 mg | ORAL_TABLET | Freq: Every day | ORAL | Status: DC
  Administered 2023-02-15: 20 mg via ORAL
  Filled 2023-02-15: qty 1

## 2023-02-15 MED ORDER — GLUCERNA SHAKE PO LIQD: 237.0000 mL | Freq: Three times a day (TID) | ORAL | Status: DC

## 2023-02-15 MED ORDER — DIVALPROEX SODIUM 500 MG PO DR TAB: 1000.0000 mg | DELAYED_RELEASE_TABLET | Freq: Every day | ORAL | Status: DC

## 2023-02-15 MED ORDER — PREDNISONE 20 MG PO TABS: 20.0000 mg | ORAL_TABLET | Freq: Every day | ORAL | Status: AC

## 2023-02-15 MED ORDER — INSULIN ASPART 100 UNIT/ML IJ SOLN: 14.0000 [IU] | Freq: Three times a day (TID) | INTRAMUSCULAR | Status: DC

## 2023-02-15 MED ORDER — FUROSEMIDE 20 MG PO TABS: 20.0000 mg | ORAL_TABLET | Freq: Every day | ORAL | Status: DC | PRN
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
## 2023-02-15 NOTE — Progress Notes (Signed)
RE: Scott [name] Date Of Birth: December 13, 1960 Date: 02/15/2023  MUST ID: [id number]  To Whom It May Concern:   Please be advised that the above name patient will require a short-term nursing home stay - anticipated 30 days or less rehabilitation and strengthening. The plan is for return home.

## 2023-02-15 NOTE — Discharge Instructions (Signed)
IMPORTANT INFORMATION: PAY CLOSE ATTENTION   PHYSICIAN DISCHARGE INSTRUCTIONS  Follow with Primary care provider  Fanta, Tesfaye Demissie, MD  and other consultants as instructed by your Hospitalist Physician  SEEK MEDICAL CARE OR RETURN TO EMERGENCY ROOM IF SYMPTOMS COME BACK, WORSEN OR NEW PROBLEM DEVELOPS   Please note: You were cared for by a hospitalist during your hospital stay. Every effort will be made to forward records to your primary care provider.  You can request that your primary care provider send for your hospital records if they have not received them.  Once you are discharged, your primary care physician will handle any further medical issues. Please note that NO REFILLS for any discharge medications will be authorized once you are discharged, as it is imperative that you return to your primary care physician (or establish a relationship with a primary care physician if you do not have one) for your post hospital discharge needs so that they can reassess your need for medications and monitor your lab values.  Please get a complete blood count and chemistry panel checked by your Primary MD at your next visit, and again as instructed by your Primary MD.  Get Medicines reviewed and adjusted: Please take all your medications with you for your next visit with your Primary MD  Laboratory/radiological data: Please request your Primary MD to go over all hospital tests and procedure/radiological results at the follow up, please ask your primary care provider to get all Hospital records sent to his/her office.  In some cases, they will be blood work, cultures and biopsy results pending at the time of your discharge. Please request that your primary care provider follow up on these results.  If you are diabetic, please bring your blood sugar readings with you to your follow up appointment with primary care.    Please call and make your follow up appointments as soon as possible.     Also Note the following: If you experience worsening of your admission symptoms, develop shortness of breath, life threatening emergency, suicidal or homicidal thoughts you must seek medical attention immediately by calling 911 or calling your MD immediately  if symptoms less severe.  You must read complete instructions/literature along with all the possible adverse reactions/side effects for all the Medicines you take and that have been prescribed to you. Take any new Medicines after you have completely understood and accpet all the possible adverse reactions/side effects.   Do not drive when taking Pain medications or sleeping medications (Benzodiazepines)  Do not take more than prescribed Pain, Sleep and Anxiety Medications. It is not advisable to combine anxiety,sleep and pain medications without talking with your primary care practitioner  Special Instructions: If you have smoked or chewed Tobacco  in the last 2 yrs please stop smoking, stop any regular Alcohol  and or any Recreational drug use.  Wear Seat belts while driving.  Do not drive if taking any narcotic, mind altering or controlled substances or recreational drugs or alcohol.       

## 2023-02-15 NOTE — Progress Notes (Signed)
This nurse noticed that pt didn't eat much of his breakfast and asked if he was hungry and he said no he didn't really have an appetite and he's been choking on his food. This nurse asked him how long that has been going on and he said since he has been here he just hasn't mentioned it to anyone. He stated "I just thought it was normal." Notified Dr. Laural Benes he stated to do a bedside swallowing assessment and if he failed will put in a SLP eval.

## 2023-02-15 NOTE — Care Management Important Message (Signed)
Important Message  Patient Details  Name: Scott [name] MRN: [id number] Date of Birth: 12-Apr-1960   Important Message Given:  Yes - Medicare IM     Corey Harold 02/15/2023, 11:42 AM

## 2023-02-15 NOTE — Progress Notes (Signed)
Bedside swallowing assessment done. Pt consumed all of the water and started choking at the end. Notified Dr.Johnson.

## 2023-02-15 NOTE — Plan of Care (Signed)

## 2023-02-15 NOTE — Progress Notes (Signed)
Report given to Herbert Seta, RN at [employer].

## 2023-02-15 NOTE — Discharge Summary (Addendum)
Physician Discharge Summary  Scott [name] WUJ:[id number] DOB: 22-Feb-1961 DOA: 02/12/2023  PCP: Benetta Spar, MD  Admit date: 02/12/2023 Discharge date: 02/15/2023  Disposition:   SNF   Recommendations for Outpatient Follow-up:  Follow up with PCP in 2 weeks Please monitor blood glucose 4-5 times per day Please adjust diabetes treatments as needed for better glycemic control Please check lithium level and depakote level in 2 weeks  Please obtain BMP/CBC in one week  Discharge Condition: SNF  CODE STATUS: FULL  DIET:  Dysphagia 2 diet   Brief Hospitalization Summary: Please see all hospital notes, images, labs for full details of the hospitalization. Admission provider HPI:  62 year old male with a history of hypertension, diabetes mellitus type 2, hyperlipidemia, bipolar disorder, tobacco abuse (quit 1 yr ago), COPD, and TBI presenting from [facility] with confusion and fever.  Apparently, the patient was wandering the halls and taking his clothes off and acting strangely.  His facility noted the patient to have a fever of 101.0 F.  He was taken the emergency department for further evaluation. In the ED, the patient was confused and not able to provide any history. On the morning of 02/12/2023, the patient was more awake and alert.  He states that he has been short of breath for about a week with a nonproductive cough.  He denies any chest pain, hemoptysis, nausea, vomiting or diarrhea, abdominal pain, dysuria.  He states that he has had his left foot ulcer for at least 3 months.  He denies any worsening drainage.  He has noted some erythema on his left leg dorsal surface over the past few days.  He states that he has been following a "foot doctor" for his foot ulcer.  In the ED, the patient was febrile to 102.2 F and tachycardic.  He was hemodynamically stable.  Oxygen saturation was 89% on room air.  He was placed on 4 L with saturation up to 96%.  The patient was started  vancomycin, cefepime, and metronidazole in the ED and given fluid boluses as appropriate for sepsis protocol.  HOSPITAL COURSE BY PROBLEM LIST   Severe sepsis - RESOLVED  -Present on admission -Secondary to cellulitis and pulmonary source -Lactic acid peaked 3.3>> 1.6 -Check PCT 0.48 -initially treated with vancomycin and ceftriaxone de-escalated 11/6   Acute respiratory failure with hypoxia -Personally reviewed chest x-ray--increased interstitial markings without frank consolidation -11/5 CT chest--airspace opacity and consolidation in the left lung base, with scattered ground-glass airspace opacity in the right lung base.  -Stable on 4 L -Wean oxygen for saturation greater 92%   Lobar pneumonia -CT chest as discussed above -MRSA screen -doxycycline 100 mg BID starting 11/6 to complete 3 more days    COPD exacerbation -treated with Rosalyn Gess -treated with Pulmicort -treated with Solu-Medrol -treated with DuoNebs -COVID-19 PCR negative/influenza negative -viral respiratory panel--Parainfluenza virus treated supportively   Cellulitis left lower extremity -treated and resolved  -Good palpable pulses   Acute on chronic renal failure--CKD 3a -Baseline creatinine 1.4-1.6 -Presented with serum creatinine 3.18 -treated with IV fluids -Held furosemide temporarily   Uncontrolled diabetes mellitus type 2 with hyperglycemia -NovoLog sliding scale -05/23/2021 hemoglobin A1c 7.9 -Repeat A1c -resuming home treatments at discharge -Please check CBG 5 times per day    Acute metabolic encephalopathy - RESOLVED  -Secondary to infectious process -Improved with fluids and IV antibiotics   Chronic diabetic foot ulcer -See pictures below -Does not appear to be infected on exam -appears to be healing well no  s/s of active infection   Essential hypertension -Continue metoprolol tartrate   Mixed hyperlipidemia -Continue statin   Bipolar disorder -Continue valproic acid, lithium,  benztropine -please check levels in 2 weeks    Discharge Diagnoses:  Principal Problem:   Acute metabolic encephalopathy Active Problems:   Left leg cellulitis   Hyperglycemia due to diabetes mellitus (HCC)   Mixed hyperlipidemia   Severe sepsis (HCC)   Lactic acidosis   AKI (acute kidney injury) (HCC)   Hypoalbuminemia due to protein-calorie malnutrition (HCC)   Bipolar disorder (HCC)   Obesity (BMI 30-39.9)   Discharge Instructions:  Allergies as of 02/15/2023   No Known Allergies      Medication List     STOP taking these medications    benzonatate 100 MG capsule Commonly known as: TESSALON   cetirizine 10 MG tablet Commonly known as: ZYRTEC   glipiZIDE 5 MG 24 hr tablet Commonly known as: GLUCOTROL XL   Melatonin 3 MG Subl   potassium chloride SA 20 MEQ tablet Commonly known as: KLOR-CON M   silver sulfADIAZINE 1 % cream Commonly known as: SILVADENE       TAKE these medications    acetaminophen 325 MG tablet Commonly known as: TYLENOL Take 650 mg by mouth every 6 (six) hours as needed.   albuterol 108 (90 Base) MCG/ACT inhaler Commonly known as: VENTOLIN HFA Inhale 2 puffs into the lungs every 6 (six) hours as needed for wheezing or shortness of breath.   amLODipine 5 MG tablet Commonly known as: NORVASC Take 1 tablet (5 mg total) by mouth daily.   atorvastatin 40 MG tablet Commonly known as: LIPITOR Take 40 mg by mouth daily.   benztropine 1 MG tablet Commonly known as: COGENTIN Take 1 mg by mouth 2 (two) times daily.   divalproex 500 MG DR tablet Commonly known as: DEPAKOTE Take 2 tablets (1,000 mg total) by mouth at bedtime. What changed:  how much to take when to take this   doxycycline 100 MG tablet Commonly known as: VIBRA-TABS Take 1 tablet (100 mg total) by mouth every 12 (twelve) hours for 3 days.   DropSafe Safety Pen Needles 31G X 6 MM Misc Generic drug: Insulin Pen Needle USE FOR ONCE DAILY LANTUS ADMINISTRATION.    BD AutoShield Duo 30G X 5 MM Misc Generic drug: Insulin Pen Needle USE FOR ONCE DAILY LANTUS ADMINISTRATION.   EasyMax Test test strip Generic drug: glucose blood USE AS DIRECTED TO CHECK BLOOD SUGAR TWICE DAILY.   feeding supplement (GLUCERNA SHAKE) Liqd Take 237 mLs by mouth 3 (three) times daily between meals.   furosemide 20 MG tablet Commonly known as: LASIX Take 1 tablet (20 mg total) by mouth daily as needed for edema or fluid (weight gain more than 3 pounds). What changed:  when to take this reasons to take this   gabapentin 100 MG capsule Commonly known as: NEURONTIN Take 100 mg by mouth at bedtime.   ipratropium-albuterol 0.5-2.5 (3) MG/3ML Soln Commonly known as: DUONEB Take 3 mLs by nebulization every 4 (four) hours as needed (wheezing, shortness of breath).   isopropyl alcohol 70 % Soln   Lantus SoloStar 100 UNIT/ML Solostar Pen Generic drug: insulin glargine INJECT 60 UNITS SUBCUTANEOUSLY AT BEDTIME. (HOLD IF BS BELOW 60:CALL MD IF BS ABOVE 400)   linagliptin 5 MG Tabs tablet Commonly known as: Tradjenta Take 1 tablet (5 mg total) by mouth daily.   lithium carbonate 300 MG capsule Take 300 mg by mouth in the  morning and at bedtime.   loratadine 10 MG tablet Commonly known as: CLARITIN Take 10 mg by mouth daily.   metFORMIN 1000 MG tablet Commonly known as: GLUCOPHAGE Take 0.5 tablets (500 mg total) by mouth 2 (two) times daily with a meal.   metoprolol tartrate 25 MG tablet Commonly known as: LOPRESSOR Take 0.5 tablets (12.5 mg total) by mouth 2 (two) times daily.   predniSONE 20 MG tablet Commonly known as: DELTASONE Take 1 tablet (20 mg total) by mouth daily with breakfast for 4 days. Start taking on: February 16, 2023   risperiDONE 2 MG tablet Commonly known as: RISPERDAL Take 1 tablet (2 mg total) by mouth daily.   silodosin 8 MG Caps capsule Commonly known as: RAPAFLO Take 1 capsule (8 mg total) by mouth at bedtime.   tiotropium 18  MCG inhalation capsule Commonly known as: SPIRIVA Place 1 capsule (18 mcg total) into inhaler and inhale daily.   traZODone 50 MG tablet Commonly known as: DESYREL Take 50 mg by mouth at bedtime.        Contact information for follow-up providers     Fanta, Wayland Salinas, MD. Schedule an appointment as soon as possible for a visit in 2 week(s).   Specialty: Internal Medicine Why: Hospital Follow Up Contact information: [address] [place] [address] [phone]              Contact information for after-discharge care     Destination     [facility] .   Service: Skilled Nursing Contact information: [address] [place] [address] [phone]                    No Known Allergies Allergies as of 02/15/2023   No Known Allergies      Medication List     STOP taking these medications    benzonatate 100 MG capsule Commonly known as: TESSALON   cetirizine 10 MG tablet Commonly known as: ZYRTEC   glipiZIDE 5 MG 24 hr tablet Commonly known as: GLUCOTROL XL   Melatonin 3 MG Subl   potassium chloride SA 20 MEQ tablet Commonly known as: KLOR-CON M   silver sulfADIAZINE 1 % cream Commonly known as: SILVADENE       TAKE these medications    acetaminophen 325 MG tablet Commonly known as: TYLENOL Take 650 mg by mouth every 6 (six) hours as needed.   albuterol 108 (90 Base) MCG/ACT inhaler Commonly known as: VENTOLIN HFA Inhale 2 puffs into the lungs every 6 (six) hours as needed for wheezing or shortness of breath.   amLODipine 5 MG tablet Commonly known as: NORVASC Take 1 tablet (5 mg total) by mouth daily.   atorvastatin 40 MG tablet Commonly known as: LIPITOR Take 40 mg by mouth daily.   benztropine 1 MG tablet Commonly known as: COGENTIN Take 1 mg by mouth 2 (two) times daily.   divalproex 500 MG DR tablet Commonly known as: DEPAKOTE Take 2  tablets (1,000 mg total) by mouth at bedtime. What changed:  how much to take when to take this   doxycycline 100 MG tablet Commonly known as: VIBRA-TABS Take 1 tablet (100 mg total) by mouth every 12 (twelve) hours for 3 days.   DropSafe Safety Pen Needles 31G X 6 MM Misc Generic drug: Insulin Pen Needle USE FOR ONCE DAILY LANTUS ADMINISTRATION.   BD AutoShield Duo 30G X 5 MM Misc Generic drug: Insulin Pen  Needle USE FOR ONCE DAILY LANTUS ADMINISTRATION.   EasyMax Test test strip Generic drug: glucose blood USE AS DIRECTED TO CHECK BLOOD SUGAR TWICE DAILY.   feeding supplement (GLUCERNA SHAKE) Liqd Take 237 mLs by mouth 3 (three) times daily between meals.   furosemide 20 MG tablet Commonly known as: LASIX Take 1 tablet (20 mg total) by mouth daily as needed for edema or fluid (weight gain more than 3 pounds). What changed:  when to take this reasons to take this   gabapentin 100 MG capsule Commonly known as: NEURONTIN Take 100 mg by mouth at bedtime.   ipratropium-albuterol 0.5-2.5 (3) MG/3ML Soln Commonly known as: DUONEB Take 3 mLs by nebulization every 4 (four) hours as needed (wheezing, shortness of breath).   isopropyl alcohol 70 % Soln   Lantus SoloStar 100 UNIT/ML Solostar Pen Generic drug: insulin glargine INJECT 60 UNITS SUBCUTANEOUSLY AT BEDTIME. (HOLD IF BS BELOW 60:CALL MD IF BS ABOVE 400)   linagliptin 5 MG Tabs tablet Commonly known as: Tradjenta Take 1 tablet (5 mg total) by mouth daily.   lithium carbonate 300 MG capsule Take 300 mg by mouth in the morning and at bedtime.   loratadine 10 MG tablet Commonly known as: CLARITIN Take 10 mg by mouth daily.   metFORMIN 1000 MG tablet Commonly known as: GLUCOPHAGE Take 0.5 tablets (500 mg total) by mouth 2 (two) times daily with a meal.   metoprolol tartrate 25 MG tablet Commonly known as: LOPRESSOR Take 0.5 tablets (12.5 mg total) by mouth 2 (two) times daily.   predniSONE 20 MG  tablet Commonly known as: DELTASONE Take 1 tablet (20 mg total) by mouth daily with breakfast for 4 days. Start taking on: February 16, 2023   risperiDONE 2 MG tablet Commonly known as: RISPERDAL Take 1 tablet (2 mg total) by mouth daily.   silodosin 8 MG Caps capsule Commonly known as: RAPAFLO Take 1 capsule (8 mg total) by mouth at bedtime.   tiotropium 18 MCG inhalation capsule Commonly known as: SPIRIVA Place 1 capsule (18 mcg total) into inhaler and inhale daily.   traZODone 50 MG tablet Commonly known as: DESYREL Take 50 mg by mouth at bedtime.        Procedures/Studies: CT CHEST WO CONTRAST  Result Date: 02/12/2023 CLINICAL DATA:  Respiratory illness, fever, nondiagnostic x-ray EXAM: CT CHEST WITHOUT CONTRAST TECHNIQUE: Multidetector CT imaging of the chest was performed following the standard protocol without IV contrast. RADIATION DOSE REDUCTION: This exam was performed according to the departmental dose-optimization program which includes automated exposure control, adjustment of the mA and/or kV according to patient size and/or use of iterative reconstruction technique. COMPARISON:  11/22/2019 FINDINGS: Cardiovascular: Aortic atherosclerosis. Normal heart size. Left coronary artery calcifications. No pericardial effusion. Mediastinum/Nodes: No enlarged mediastinal, hilar, or axillary lymph nodes. Thyroid gland, trachea, and esophagus demonstrate no significant findings. Lungs/Pleura: Examination limited by breath motion artifact, particularly in the lung bases. Within this limitation, heterogeneous airspace opacity and consolidation in the left lung base, with scattered ground-glass airspace opacity in the right lung base (series 3, image 106). Mild paraseptal emphysema. No pleural effusion or pneumothorax. Upper Abdomen: No acute abnormality. Musculoskeletal: No chest wall abnormality. No acute osseous findings. IMPRESSION: 1. Examination limited by breath motion artifact,  particularly in the lung bases. Within this limitation, heterogeneous airspace opacity and consolidation in the left lung base, with scattered ground-glass airspace opacity in the right lung base. Findings are consistent with infection or aspiration. Recommend at minimum radiographic follow-up  to resolution to exclude underlying mass in the left lung base. Follow-up CT in 6-8 weeks preferred. 2. Emphysema. 3. Coronary artery disease. Aortic Atherosclerosis (ICD10-I70.0) and Emphysema (ICD10-J43.9). Electronically Signed   By: Jearld Lesch M.D.   On: 02/12/2023 12:51   DG Chest Port 1 View  Result Date: 02/12/2023 CLINICAL DATA:  Possible sepsis. EXAM: PORTABLE CHEST 1 VIEW COMPARISON:  01/25/2022. FINDINGS: The heart size and mediastinal contours are within normal limits. Mild atelectasis is noted at the lung bases. No effusion or pneumothorax is seen. No acute osseous abnormality is identified. IMPRESSION: Mild atelectasis at the lung bases. Electronically Signed   By: Thornell Sartorius M.D.   On: 02/12/2023 00:40     Subjective: Pt eager to discharge, no specific complaints.   Discharge Exam: Vitals:   02/15/23 0724 02/15/23 0728  BP:    Pulse:    Resp:    Temp:    SpO2: 96% 96%   Vitals:   02/15/23 0348 02/15/23 0720 02/15/23 0724 02/15/23 0728  BP: (!) 150/86     Pulse: 83     Resp: 16     Temp: 97.9 F (36.6 C)     TempSrc: Oral     SpO2: 97% 96% 96% 96%  Weight:      Height:       General: Pt is alert, awake, not in acute distress Cardiovascular: normal S1/S2 +, no rubs, no gallops Respiratory: CTA bilaterally, no wheezing, no rhonchi Abdominal: Soft, NT, ND, bowel sounds + Extremities: no s/s of cellulitis or infection seen.    The results of significant diagnostics from this hospitalization (including imaging, microbiology, ancillary and laboratory) are listed below for reference.     Microbiology: Recent Results (from the past 240 hour(s))  Culture, blood (Routine X 2)  w Reflex to ID Panel     Status: None (Preliminary result)   Collection Time: 02/12/23 12:20 AM   Specimen: Left Antecubital  Result Value Ref Range Status   Specimen Description LEFT ANTECUBITAL  Final   Special Requests   Final    BOTTLES DRAWN AEROBIC AND ANAEROBIC Blood Culture results may not be optimal due to an excessive volume of blood received in culture bottles   Culture   Final    NO GROWTH 3 DAYS Performed at [facility], [address], [place], [place] [address]    Report Status PENDING  Incomplete  Resp panel by RT-PCR (RSV, Flu A&B, Covid) Anterior Nasal Swab     Status: None   Collection Time: 02/12/23 12:37 AM   Specimen: Anterior Nasal Swab  Result Value Ref Range Status   SARS Coronavirus 2 by RT PCR NEGATIVE NEGATIVE Final    Comment: (NOTE) SARS-CoV-2 target nucleic acids are NOT DETECTED.  The SARS-CoV-2 RNA is generally detectable in upper respiratory specimens during the acute phase of infection. The lowest concentration of SARS-CoV-2 viral copies this assay can detect is 138 copies/mL. A negative result does not preclude SARS-Cov-2 infection and should not be used as the sole basis for treatment or other patient management decisions. A negative result may occur with  improper specimen collection/handling, submission of specimen other than nasopharyngeal swab, presence of viral mutation(s) within the areas targeted by this assay, and inadequate number of viral copies(<138 copies/mL). A negative result must be combined with clinical observations, patient history, and epidemiological information. The expected result is Negative.  Fact Sheet for Patients:  BloggerCourse.com  Fact Sheet for Healthcare Providers:  SeriousBroker.it  This test  is no t yet approved or cleared by the [place] and  has been authorized for detection and/or diagnosis of SARS-CoV-2 by FDA under an Emergency Use  Authorization (EUA). This EUA will remain  in effect (meaning this test can be used) for the duration of the COVID-19 declaration under Section 564(b)(1) of the Act, 21 U.S.C.section 360bbb-3(b)(1), unless the authorization is terminated  or revoked sooner.       Influenza A by PCR NEGATIVE NEGATIVE Final   Influenza B by PCR NEGATIVE NEGATIVE Final    Comment: (NOTE) The Xpert Xpress SARS-CoV-2/FLU/RSV plus assay is intended as an aid in the diagnosis of influenza from Nasopharyngeal swab specimens and should not be used as a sole basis for treatment. Nasal washings and aspirates are unacceptable for Xpert Xpress SARS-CoV-2/FLU/RSV testing.  Fact Sheet for Patients: BloggerCourse.com  Fact Sheet for Healthcare Providers: SeriousBroker.it  This test is not yet approved or cleared by the [place] FDA and has been authorized for detection and/or diagnosis of SARS-CoV-2 by FDA under an Emergency Use Authorization (EUA). This EUA will remain in effect (meaning this test can be used) for the duration of the COVID-19 declaration under Section 564(b)(1) of the Act, 21 U.S.C. section 360bbb-3(b)(1), unless the authorization is terminated or revoked.     Resp Syncytial Virus by PCR NEGATIVE NEGATIVE Final    Comment: (NOTE) Fact Sheet for Patients: BloggerCourse.com  Fact Sheet for Healthcare Providers: SeriousBroker.it  This test is not yet approved or cleared by the [place] FDA and has been authorized for detection and/or diagnosis of SARS-CoV-2 by FDA under an Emergency Use Authorization (EUA). This EUA will remain in effect (meaning this test can be used) for the duration of the COVID-19 declaration under Section 564(b)(1) of the Act, 21 U.S.C. section 360bbb-3(b)(1), unless the authorization is terminated or revoked.  Performed at [facility], [address],  [place], [place] [address]   Blood Culture (routine x 2)     Status: None (Preliminary result)   Collection Time: 02/12/23  1:07 AM   Specimen: BLOOD  Result Value Ref Range Status   Specimen Description BLOOD BLOOD RIGHT HAND  Final   Special Requests   Final    BOTTLES DRAWN AEROBIC AND ANAEROBIC Blood Culture results may not be optimal due to an excessive volume of blood received in culture bottles   Culture   Final    NO GROWTH 3 DAYS Performed at [facility], [address], [place], [place] [address]    Report Status PENDING  Incomplete  Respiratory (~20 pathogens) panel by PCR     Status: Abnormal   Collection Time: 02/13/23  7:57 AM   Specimen: Nasopharyngeal Swab; Respiratory  Result Value Ref Range Status   Adenovirus NOT DETECTED NOT DETECTED Final   Coronavirus 229E NOT DETECTED NOT DETECTED Final    Comment: (NOTE) The Coronavirus on the Respiratory Panel, DOES NOT test for the novel  Coronavirus (2019 nCoV)    Coronavirus HKU1 NOT DETECTED NOT DETECTED Final   Coronavirus NL63 NOT DETECTED NOT DETECTED Final   Coronavirus OC43 NOT DETECTED NOT DETECTED Final   Metapneumovirus NOT DETECTED NOT DETECTED Final   Rhinovirus / Enterovirus NOT DETECTED NOT DETECTED Final   Influenza A NOT DETECTED NOT DETECTED Final   Influenza B NOT DETECTED NOT DETECTED Final   Parainfluenza Virus 1 NOT DETECTED NOT DETECTED Final   Parainfluenza Virus 2 NOT DETECTED NOT DETECTED Final   Parainfluenza Virus 3 NOT DETECTED  NOT DETECTED Final   Parainfluenza Virus 4 DETECTED (A) NOT DETECTED Final   Respiratory Syncytial Virus NOT DETECTED NOT DETECTED Final   Bordetella pertussis NOT DETECTED NOT DETECTED Final   Bordetella Parapertussis NOT DETECTED NOT DETECTED Final   Chlamydophila pneumoniae NOT DETECTED NOT DETECTED Final   Mycoplasma pneumoniae NOT DETECTED NOT DETECTED Final    Comment: Performed at [facility], [address], [place], [place] [address]  MRSA Next Gen by  PCR, Nasal     Status: None   Collection Time: 02/13/23  9:24 AM   Specimen: Nasal Mucosa; Nasal Swab  Result Value Ref Range Status   MRSA by PCR Next Gen NOT DETECTED NOT DETECTED Final    Comment: Performed at [facility], [address] [place], [place] [address]     Labs: BNP (last 3 results) No results for input(s): "BNP" in the last 8760 hours. Basic Metabolic Panel: Recent Labs  Lab 02/12/23 0020 02/12/23 0453 02/13/23 0439  NA 132* 135 143  K 4.5 4.1 4.1  CL 101 104 108  CO2 19* 21* 24  GLUCOSE 249* 210* 344*  BUN 38* 33* 38*  CREATININE 2.18* 1.93* 1.84*  CALCIUM 9.2 8.9 9.0  MG  --  1.9 2.3  PHOS  --  3.8  --    Liver Function Tests: Recent Labs  Lab 02/12/23 0020 02/12/23 0453  AST 14* 12*  ALT 15 14  ALKPHOS 69 59  BILITOT 0.7 0.7  PROT 7.4 6.7  ALBUMIN 2.9* 2.7*   No results for input(s): "LIPASE", "AMYLASE" in the last 168 hours. No results for input(s): "AMMONIA" in the last 168 hours. CBC: Recent Labs  Lab 02/12/23 0020 02/12/23 0453 02/13/23 0439  WBC 16.5* 14.2* 9.7  NEUTROABS 11.7*  --   --   HGB 11.8* 11.0* 11.0*  HCT 37.3* 35.9* 34.9*  MCV 97.1 98.6 98.3  PLT 219 192 205   Cardiac Enzymes: Recent Labs  Lab 02/12/23 0453  CKTOTAL 48*   BNP: Invalid input(s): "POCBNP" CBG: Recent Labs  Lab 02/14/23 1633 02/14/23 2029 02/15/23 0316 02/15/23 0730 02/15/23 1136  GLUCAP 414* 255* 276* 260* 317*   D-Dimer No results for input(s): "DDIMER" in the last 72 hours. Hgb A1c No results for input(s): "HGBA1C" in the last 72 hours. Lipid Profile No results for input(s): "CHOL", "HDL", "LDLCALC", "TRIG", "CHOLHDL", "LDLDIRECT" in the last 72 hours. Thyroid function studies No results for input(s): "TSH", "T4TOTAL", "T3FREE", "THYROIDAB" in the last 72 hours.  Invalid input(s): "FREET3" Anemia work up No results for input(s): "VITAMINB12", "FOLATE", "FERRITIN", "TIBC", "IRON", "RETICCTPCT" in the last 72 hours. Urinalysis     Component Value Date/Time   COLORURINE STRAW (A) 02/12/2023 0147   APPEARANCEUR CLEAR 02/12/2023 0147   APPEARANCEUR Clear 12/05/2022 1307   LABSPEC 1.010 02/12/2023 0147   PHURINE 6.0 02/12/2023 0147   GLUCOSEU 150 (A) 02/12/2023 0147   HGBUR SMALL (A) 02/12/2023 0147   BILIRUBINUR NEGATIVE 02/12/2023 0147   BILIRUBINUR Negative 12/05/2022 1307   KETONESUR NEGATIVE 02/12/2023 0147   PROTEINUR 100 (A) 02/12/2023 0147   UROBILINOGEN 0.2 07/17/2011 2014   NITRITE NEGATIVE 02/12/2023 0147   LEUKOCYTESUR NEGATIVE 02/12/2023 0147   Sepsis Labs Recent Labs  Lab 02/12/23 0020 02/12/23 0453 02/13/23 0439  WBC 16.5* 14.2* 9.7   Microbiology Recent Results (from the past 240 hour(s))  Culture, blood (Routine X 2) w Reflex to ID Panel     Status: None (Preliminary result)   Collection Time:  02/12/23 12:20 AM   Specimen: Left Antecubital  Result Value Ref Range Status   Specimen Description LEFT ANTECUBITAL  Final   Special Requests   Final    BOTTLES DRAWN AEROBIC AND ANAEROBIC Blood Culture results may not be optimal due to an excessive volume of blood received in culture bottles   Culture   Final    NO GROWTH 3 DAYS Performed at [facility], [address], [place], [place] [address]    Report Status PENDING  Incomplete  Resp panel by RT-PCR (RSV, Flu A&B, Covid) Anterior Nasal Swab     Status: None   Collection Time: 02/12/23 12:37 AM   Specimen: Anterior Nasal Swab  Result Value Ref Range Status   SARS Coronavirus 2 by RT PCR NEGATIVE NEGATIVE Final    Comment: (NOTE) SARS-CoV-2 target nucleic acids are NOT DETECTED.  The SARS-CoV-2 RNA is generally detectable in upper respiratory specimens during the acute phase of infection. The lowest concentration of SARS-CoV-2 viral copies this assay can detect is 138 copies/mL. A negative result does not preclude SARS-Cov-2 infection and should not be used as the sole basis for treatment or other patient management decisions. A  negative result may occur with  improper specimen collection/handling, submission of specimen other than nasopharyngeal swab, presence of viral mutation(s) within the areas targeted by this assay, and inadequate number of viral copies(<138 copies/mL). A negative result must be combined with clinical observations, patient history, and epidemiological information. The expected result is Negative.  Fact Sheet for Patients:  BloggerCourse.com  Fact Sheet for Healthcare Providers:  SeriousBroker.it  This test is no t yet approved or cleared by the [place] FDA and  has been authorized for detection and/or diagnosis of SARS-CoV-2 by FDA under an Emergency Use Authorization (EUA). This EUA will remain  in effect (meaning this test can be used) for the duration of the COVID-19 declaration under Section 564(b)(1) of the Act, 21 U.S.C.section 360bbb-3(b)(1), unless the authorization is terminated  or revoked sooner.       Influenza A by PCR NEGATIVE NEGATIVE Final   Influenza B by PCR NEGATIVE NEGATIVE Final    Comment: (NOTE) The Xpert Xpress SARS-CoV-2/FLU/RSV plus assay is intended as an aid in the diagnosis of influenza from Nasopharyngeal swab specimens and should not be used as a sole basis for treatment. Nasal washings and aspirates are unacceptable for Xpert Xpress SARS-CoV-2/FLU/RSV testing.  Fact Sheet for Patients: BloggerCourse.com  Fact Sheet for Healthcare Providers: SeriousBroker.it  This test is not yet approved or cleared by the [place] FDA and has been authorized for detection and/or diagnosis of SARS-CoV-2 by FDA under an Emergency Use Authorization (EUA). This EUA will remain in effect (meaning this test can be used) for the duration of the COVID-19 declaration under Section 564(b)(1) of the Act, 21 U.S.C. section 360bbb-3(b)(1), unless the authorization  is terminated or revoked.     Resp Syncytial Virus by PCR NEGATIVE NEGATIVE Final    Comment: (NOTE) Fact Sheet for Patients: BloggerCourse.com  Fact Sheet for Healthcare Providers: SeriousBroker.it  This test is not yet approved or cleared by the [place] FDA and has been authorized for detection and/or diagnosis of SARS-CoV-2 by FDA under an Emergency Use Authorization (EUA). This EUA will remain in effect (meaning this test can be used) for the duration of the COVID-19 declaration under Section 564(b)(1) of the Act, 21 U.S.C. section 360bbb-3(b)(1), unless the authorization is terminated or revoked.  Performed at [facility], [address], [place], [place] [address]   Blood Culture (routine x 2)     Status: None (Preliminary result)   Collection Time: 02/12/23  1:07 AM   Specimen: BLOOD  Result Value Ref Range Status   Specimen Description BLOOD BLOOD RIGHT HAND  Final   Special Requests   Final    BOTTLES DRAWN AEROBIC AND ANAEROBIC Blood Culture results may not be optimal due to an excessive volume of blood received in culture bottles   Culture   Final    NO GROWTH 3 DAYS Performed at [facility], [address] [place], [place] [address]    Report Status PENDING  Incomplete  Respiratory (~20 pathogens) panel by PCR     Status: Abnormal   Collection Time: 02/13/23  7:57 AM   Specimen: Nasopharyngeal Swab; Respiratory  Result Value Ref Range Status   Adenovirus NOT DETECTED NOT DETECTED Final   Coronavirus 229E NOT DETECTED NOT DETECTED Final    Comment: (NOTE) The Coronavirus on the Respiratory Panel, DOES NOT test for the novel  Coronavirus (2019 nCoV)    Coronavirus HKU1 NOT DETECTED NOT DETECTED Final   Coronavirus NL63 NOT DETECTED NOT DETECTED Final   Coronavirus OC43 NOT DETECTED NOT DETECTED Final   Metapneumovirus NOT DETECTED NOT DETECTED Final   Rhinovirus / Enterovirus NOT DETECTED NOT DETECTED  Final   Influenza A NOT DETECTED NOT DETECTED Final   Influenza B NOT DETECTED NOT DETECTED Final   Parainfluenza Virus 1 NOT DETECTED NOT DETECTED Final   Parainfluenza Virus 2 NOT DETECTED NOT DETECTED Final   Parainfluenza Virus 3 NOT DETECTED NOT DETECTED Final   Parainfluenza Virus 4 DETECTED (A) NOT DETECTED Final   Respiratory Syncytial Virus NOT DETECTED NOT DETECTED Final   Bordetella pertussis NOT DETECTED NOT DETECTED Final   Bordetella Parapertussis NOT DETECTED NOT DETECTED Final   Chlamydophila pneumoniae NOT DETECTED NOT DETECTED Final   Mycoplasma pneumoniae NOT DETECTED NOT DETECTED Final    Comment: Performed at [facility], [address] [place], [place] [address]  MRSA Next Gen by PCR, Nasal     Status: None   Collection Time: 02/13/23  9:24 AM   Specimen: Nasal Mucosa; Nasal Swab  Result Value Ref Range Status   MRSA by PCR Next Gen NOT DETECTED NOT DETECTED Final    Comment: Performed at [facility], [address], [place], [place] [address]   Time coordinating discharge:  38 mins   SIGNED:  Standley Dakins, MD  [facility] 02/15/2023, 12:30 PM How to contact the [facility] Attending or Consulting provider 7A - 7P or covering provider during after hours 7P -7A, for this patient?  Check the care team in [facility] and look for a) attending/consulting TRH provider listed and b) the [facility] team listed Log into [email or web address] and use [place]'s universal password to access. If you do not have the password, please contact the hospital operator. Locate the [facility] provider you are looking for under [facility] and page to a number that you can be directly reached. If you still have difficulty reaching the provider, please page the [facility] (Director on Call) for the Hospitalists listed on amion for assistance.

## 2023-02-15 NOTE — Plan of Care (Signed)
  Problem: Education: Goal: Knowledge of General Education information will improve Description: Including pain rating scale, medication(s)/side effects and non-pharmacologic comfort measures Outcome: Adequate for Discharge   Problem: Health Behavior/Discharge Planning: Goal: Ability to manage health-related needs will improve Outcome: Adequate for Discharge   Problem: Clinical Measurements: Goal: Ability to maintain clinical measurements within normal limits will improve Outcome: Adequate for Discharge Goal: Will remain free from infection Outcome: Adequate for Discharge Goal: Diagnostic test results will improve Outcome: Adequate for Discharge Goal: Respiratory complications will improve Outcome: Adequate for Discharge Goal: Cardiovascular complication will be avoided Outcome: Adequate for Discharge   Problem: Activity: Goal: Risk for activity intolerance will decrease Outcome: Adequate for Discharge   Problem: Nutrition: Goal: Adequate nutrition will be maintained Outcome: Adequate for Discharge   Problem: Coping: Goal: Level of anxiety will decrease Outcome: Adequate for Discharge   Problem: Elimination: Goal: Will not experience complications related to bowel motility Outcome: Adequate for Discharge Goal: Will not experience complications related to urinary retention Outcome: Adequate for Discharge   Problem: Pain Management: Goal: General experience of comfort will improve Outcome: Adequate for Discharge   Problem: Safety: Goal: Ability to remain free from injury will improve Outcome: Adequate for Discharge   Problem: Skin Integrity: Goal: Risk for impaired skin integrity will decrease Outcome: Adequate for Discharge   Problem: Education: Goal: Ability to describe self-care measures that may prevent or decrease complications (Diabetes Survival Skills Education) will improve Outcome: Adequate for Discharge Goal: Individualized Educational Video(s) Outcome:  Adequate for Discharge   Problem: Coping: Goal: Ability to adjust to condition or change in health will improve Outcome: Adequate for Discharge   Problem: Fluid Volume: Goal: Ability to maintain a balanced intake and output will improve Outcome: Adequate for Discharge   Problem: Health Behavior/Discharge Planning: Goal: Ability to identify and utilize available resources and services will improve Outcome: Adequate for Discharge Goal: Ability to manage health-related needs will improve Outcome: Adequate for Discharge   Problem: Metabolic: Goal: Ability to maintain appropriate glucose levels will improve Outcome: Adequate for Discharge   Problem: Nutritional: Goal: Maintenance of adequate nutrition will improve Outcome: Adequate for Discharge Goal: Progress toward achieving an optimal weight will improve Outcome: Adequate for Discharge   Problem: Skin Integrity: Goal: Risk for impaired skin integrity will decrease Outcome: Adequate for Discharge   Problem: Tissue Perfusion: Goal: Adequacy of tissue perfusion will improve Outcome: Adequate for Discharge   Problem: Acute Rehab PT Goals(only PT should resolve) Goal: Pt Will Go Supine/Side To Sit Outcome: Adequate for Discharge Goal: Patient Will Transfer Sit To/From Stand Outcome: Adequate for Discharge Goal: Pt Will Transfer Bed To Chair/Chair To Bed Outcome: Adequate for Discharge Goal: Pt Will Ambulate Outcome: Adequate for Discharge

## 2023-02-15 NOTE — TOC Transition Note (Signed)
Transition of Care [facility]) - CM/SW Discharge Note   Patient Details  Name: CASPEN PANCIERA MRN: [id number] Date of Birth: Apr 14, 1960  [facility] CM/SW Contact:  Villa Herb, LCSWA Phone Number: 02/15/2023, 3:00 PM   Clinical Narrative:    CSW updated that pt is medically stable for D/C to [facility]. CSW confirmed with Eunice Blase in admissions that the facility can accept pt today. CSW updated pts LG and she is agreeable to transfer and transportation being set up for pt. CSW provided RN with numbers for room and report. [facility] called for transport. TOC signing off.   Final next level of care: Skilled Nursing Facility Barriers to Discharge: Barriers Resolved   Patient Goals and CMS Choice CMS Medicare.gov Compare Post Acute Care list provided to:: Legal Guardian Choice offered to / list presented to : [facility] POA / Guardian  Discharge Placement                  Patient to be transferred to facility by: [facility] Name of family member notified: Legal guardian Patient and family notified of of transfer: 02/15/23  Discharge Plan and Services Additional resources added to the After Visit Summary for   In-house Referral: Clinical Social Work Discharge Planning Services: CM Consult Post Acute Care Choice: Resumption of Svcs/PTA Provider                               Social Determinants of Health (SDOH) Interventions SDOH Screenings   Food Insecurity: No Food Insecurity (02/12/2023)  Housing: Low Risk  (02/12/2023)  Transportation Needs: No Transportation Needs (02/12/2023)  Utilities: Not At Risk (02/12/2023)  Tobacco Use: Low Risk  (01/14/2023)     Readmission Risk Interventions    02/12/2023   10:56 AM  Readmission Risk Prevention Plan  Transportation Screening Complete  HRI or Home Care Consult Complete  Social Work Consult for Recovery Care Planning/Counseling Complete  Palliative Care Screening Not Applicable  Medication Review Oceanographer)  Complete

## 2023-02-17 LAB — CULTURE, BLOOD (ROUTINE X 2)
Culture: NO GROWTH
Culture: NO GROWTH

## 2023-02-18 DIAGNOSIS — F319 Bipolar disorder, unspecified: Secondary | ICD-10-CM | POA: Diagnosis not present

## 2023-02-18 DIAGNOSIS — J181 Lobar pneumonia, unspecified organism: Secondary | ICD-10-CM | POA: Diagnosis not present

## 2023-02-18 DIAGNOSIS — M6281 Muscle weakness (generalized): Secondary | ICD-10-CM | POA: Diagnosis not present

## 2023-02-18 DIAGNOSIS — I10 Essential (primary) hypertension: Secondary | ICD-10-CM | POA: Diagnosis not present

## 2023-02-18 DIAGNOSIS — J441 Chronic obstructive pulmonary disease with (acute) exacerbation: Secondary | ICD-10-CM | POA: Diagnosis not present

## 2023-02-18 DIAGNOSIS — E119 Type 2 diabetes mellitus without complications: Secondary | ICD-10-CM | POA: Diagnosis not present

## 2023-02-18 DIAGNOSIS — J9601 Acute respiratory failure with hypoxia: Secondary | ICD-10-CM | POA: Diagnosis not present

## 2023-02-18 DIAGNOSIS — E11621 Type 2 diabetes mellitus with foot ulcer: Secondary | ICD-10-CM | POA: Diagnosis not present

## 2023-02-20 DIAGNOSIS — F319 Bipolar disorder, unspecified: Secondary | ICD-10-CM | POA: Diagnosis not present

## 2023-02-20 DIAGNOSIS — E119 Type 2 diabetes mellitus without complications: Secondary | ICD-10-CM | POA: Diagnosis not present

## 2023-02-20 DIAGNOSIS — J9601 Acute respiratory failure with hypoxia: Secondary | ICD-10-CM | POA: Diagnosis not present

## 2023-02-20 DIAGNOSIS — E11621 Type 2 diabetes mellitus with foot ulcer: Secondary | ICD-10-CM | POA: Diagnosis not present

## 2023-02-20 DIAGNOSIS — I10 Essential (primary) hypertension: Secondary | ICD-10-CM | POA: Diagnosis not present

## 2023-02-20 DIAGNOSIS — M6281 Muscle weakness (generalized): Secondary | ICD-10-CM | POA: Diagnosis not present

## 2023-02-20 DIAGNOSIS — J181 Lobar pneumonia, unspecified organism: Secondary | ICD-10-CM | POA: Diagnosis not present

## 2023-02-20 DIAGNOSIS — J441 Chronic obstructive pulmonary disease with (acute) exacerbation: Secondary | ICD-10-CM | POA: Diagnosis not present

## 2023-02-21 DIAGNOSIS — L97522 Non-pressure chronic ulcer of other part of left foot with fat layer exposed: Secondary | ICD-10-CM | POA: Diagnosis not present

## 2023-02-21 DIAGNOSIS — F319 Bipolar disorder, unspecified: Secondary | ICD-10-CM | POA: Diagnosis not present

## 2023-02-21 DIAGNOSIS — E11621 Type 2 diabetes mellitus with foot ulcer: Secondary | ICD-10-CM | POA: Diagnosis not present

## 2023-02-21 DIAGNOSIS — J449 Chronic obstructive pulmonary disease, unspecified: Secondary | ICD-10-CM | POA: Diagnosis not present

## 2023-02-21 DIAGNOSIS — F419 Anxiety disorder, unspecified: Secondary | ICD-10-CM | POA: Diagnosis not present

## 2023-02-21 DIAGNOSIS — Z794 Long term (current) use of insulin: Secondary | ICD-10-CM | POA: Diagnosis not present

## 2023-02-21 DIAGNOSIS — M6281 Muscle weakness (generalized): Secondary | ICD-10-CM | POA: Diagnosis not present

## 2023-02-21 DIAGNOSIS — F331 Major depressive disorder, recurrent, moderate: Secondary | ICD-10-CM | POA: Diagnosis not present

## 2023-02-21 DIAGNOSIS — I10 Essential (primary) hypertension: Secondary | ICD-10-CM | POA: Diagnosis not present

## 2023-02-21 DIAGNOSIS — N189 Chronic kidney disease, unspecified: Secondary | ICD-10-CM | POA: Diagnosis not present

## 2023-02-21 DIAGNOSIS — E785 Hyperlipidemia, unspecified: Secondary | ICD-10-CM | POA: Diagnosis not present

## 2023-02-21 DIAGNOSIS — R251 Tremor, unspecified: Secondary | ICD-10-CM | POA: Diagnosis not present

## 2023-02-21 DIAGNOSIS — E1165 Type 2 diabetes mellitus with hyperglycemia: Secondary | ICD-10-CM | POA: Diagnosis not present

## 2023-02-21 DIAGNOSIS — N1831 Chronic kidney disease, stage 3a: Secondary | ICD-10-CM | POA: Diagnosis not present

## 2023-02-21 DIAGNOSIS — E1169 Type 2 diabetes mellitus with other specified complication: Secondary | ICD-10-CM | POA: Diagnosis not present

## 2023-02-22 DIAGNOSIS — E119 Type 2 diabetes mellitus without complications: Secondary | ICD-10-CM | POA: Diagnosis not present

## 2023-02-22 DIAGNOSIS — E11621 Type 2 diabetes mellitus with foot ulcer: Secondary | ICD-10-CM | POA: Diagnosis not present

## 2023-02-22 DIAGNOSIS — J181 Lobar pneumonia, unspecified organism: Secondary | ICD-10-CM | POA: Diagnosis not present

## 2023-02-22 DIAGNOSIS — F319 Bipolar disorder, unspecified: Secondary | ICD-10-CM | POA: Diagnosis not present

## 2023-02-22 DIAGNOSIS — J441 Chronic obstructive pulmonary disease with (acute) exacerbation: Secondary | ICD-10-CM | POA: Diagnosis not present

## 2023-02-22 DIAGNOSIS — J9601 Acute respiratory failure with hypoxia: Secondary | ICD-10-CM | POA: Diagnosis not present

## 2023-02-22 DIAGNOSIS — M6281 Muscle weakness (generalized): Secondary | ICD-10-CM | POA: Diagnosis not present

## 2023-02-22 DIAGNOSIS — I10 Essential (primary) hypertension: Secondary | ICD-10-CM | POA: Diagnosis not present

## 2023-02-25 DIAGNOSIS — E119 Type 2 diabetes mellitus without complications: Secondary | ICD-10-CM | POA: Diagnosis not present

## 2023-02-25 DIAGNOSIS — I10 Essential (primary) hypertension: Secondary | ICD-10-CM | POA: Diagnosis not present

## 2023-02-25 DIAGNOSIS — J9601 Acute respiratory failure with hypoxia: Secondary | ICD-10-CM | POA: Diagnosis not present

## 2023-02-25 DIAGNOSIS — J441 Chronic obstructive pulmonary disease with (acute) exacerbation: Secondary | ICD-10-CM | POA: Diagnosis not present

## 2023-02-25 DIAGNOSIS — E11621 Type 2 diabetes mellitus with foot ulcer: Secondary | ICD-10-CM | POA: Diagnosis not present

## 2023-02-25 DIAGNOSIS — M6281 Muscle weakness (generalized): Secondary | ICD-10-CM | POA: Diagnosis not present

## 2023-02-25 DIAGNOSIS — F319 Bipolar disorder, unspecified: Secondary | ICD-10-CM | POA: Diagnosis not present

## 2023-02-25 DIAGNOSIS — J181 Lobar pneumonia, unspecified organism: Secondary | ICD-10-CM | POA: Diagnosis not present

## 2023-02-27 DIAGNOSIS — E1165 Type 2 diabetes mellitus with hyperglycemia: Secondary | ICD-10-CM | POA: Diagnosis not present

## 2023-02-27 DIAGNOSIS — L97522 Non-pressure chronic ulcer of other part of left foot with fat layer exposed: Secondary | ICD-10-CM | POA: Diagnosis not present

## 2023-02-27 DIAGNOSIS — N189 Chronic kidney disease, unspecified: Secondary | ICD-10-CM | POA: Diagnosis not present

## 2023-02-27 DIAGNOSIS — F331 Major depressive disorder, recurrent, moderate: Secondary | ICD-10-CM | POA: Diagnosis not present

## 2023-02-27 DIAGNOSIS — M6281 Muscle weakness (generalized): Secondary | ICD-10-CM | POA: Diagnosis not present

## 2023-02-27 DIAGNOSIS — J449 Chronic obstructive pulmonary disease, unspecified: Secondary | ICD-10-CM | POA: Diagnosis not present

## 2023-02-27 DIAGNOSIS — F319 Bipolar disorder, unspecified: Secondary | ICD-10-CM | POA: Diagnosis not present

## 2023-02-27 DIAGNOSIS — F419 Anxiety disorder, unspecified: Secondary | ICD-10-CM | POA: Diagnosis not present

## 2023-02-28 DIAGNOSIS — J181 Lobar pneumonia, unspecified organism: Secondary | ICD-10-CM | POA: Diagnosis not present

## 2023-02-28 DIAGNOSIS — F319 Bipolar disorder, unspecified: Secondary | ICD-10-CM | POA: Diagnosis not present

## 2023-02-28 DIAGNOSIS — E1169 Type 2 diabetes mellitus with other specified complication: Secondary | ICD-10-CM | POA: Diagnosis not present

## 2023-02-28 DIAGNOSIS — E11621 Type 2 diabetes mellitus with foot ulcer: Secondary | ICD-10-CM | POA: Diagnosis not present

## 2023-02-28 DIAGNOSIS — L97509 Non-pressure chronic ulcer of other part of unspecified foot with unspecified severity: Secondary | ICD-10-CM | POA: Diagnosis not present

## 2023-02-28 DIAGNOSIS — M6281 Muscle weakness (generalized): Secondary | ICD-10-CM | POA: Diagnosis not present

## 2023-02-28 DIAGNOSIS — J441 Chronic obstructive pulmonary disease with (acute) exacerbation: Secondary | ICD-10-CM | POA: Diagnosis not present

## 2023-02-28 DIAGNOSIS — R251 Tremor, unspecified: Secondary | ICD-10-CM | POA: Diagnosis not present

## 2023-03-05 DIAGNOSIS — E1142 Type 2 diabetes mellitus with diabetic polyneuropathy: Secondary | ICD-10-CM | POA: Diagnosis not present

## 2023-03-05 DIAGNOSIS — L03116 Cellulitis of left lower limb: Secondary | ICD-10-CM | POA: Diagnosis not present

## 2023-03-05 DIAGNOSIS — L97522 Non-pressure chronic ulcer of other part of left foot with fat layer exposed: Secondary | ICD-10-CM | POA: Diagnosis not present

## 2023-03-05 DIAGNOSIS — E1165 Type 2 diabetes mellitus with hyperglycemia: Secondary | ICD-10-CM | POA: Diagnosis not present

## 2023-03-05 DIAGNOSIS — B356 Tinea cruris: Secondary | ICD-10-CM | POA: Diagnosis not present

## 2023-03-05 DIAGNOSIS — Z0001 Encounter for general adult medical examination with abnormal findings: Secondary | ICD-10-CM | POA: Diagnosis not present

## 2023-03-05 DIAGNOSIS — I10 Essential (primary) hypertension: Secondary | ICD-10-CM | POA: Diagnosis not present

## 2023-03-05 DIAGNOSIS — N189 Chronic kidney disease, unspecified: Secondary | ICD-10-CM | POA: Diagnosis not present

## 2023-03-07 ENCOUNTER — Emergency Department (HOSPITAL_COMMUNITY)
Admission: EM | Admit: 2023-03-07 | Discharge: 2023-03-08 | Disposition: A | Discharge status: Home / Self Care | Payer: Medicare Other | Attending: Emergency Medicine | Admitting: Emergency Medicine

## 2023-03-07 ENCOUNTER — Emergency Department (HOSPITAL_COMMUNITY): Payer: Medicare Other

## 2023-03-07 DIAGNOSIS — Z794 Long term (current) use of insulin: Secondary | ICD-10-CM | POA: Insufficient documentation

## 2023-03-07 DIAGNOSIS — M7989 Other specified soft tissue disorders: Secondary | ICD-10-CM | POA: Diagnosis not present

## 2023-03-07 DIAGNOSIS — I2699 Other pulmonary embolism without acute cor pulmonale: Secondary | ICD-10-CM | POA: Diagnosis not present

## 2023-03-07 DIAGNOSIS — R140 Abdominal distension (gaseous): Secondary | ICD-10-CM | POA: Insufficient documentation

## 2023-03-07 DIAGNOSIS — E119 Type 2 diabetes mellitus without complications: Secondary | ICD-10-CM | POA: Diagnosis not present

## 2023-03-07 DIAGNOSIS — R0902 Hypoxemia: Secondary | ICD-10-CM | POA: Insufficient documentation

## 2023-03-07 DIAGNOSIS — I2510 Atherosclerotic heart disease of native coronary artery without angina pectoris: Secondary | ICD-10-CM | POA: Diagnosis not present

## 2023-03-07 DIAGNOSIS — R4182 Altered mental status, unspecified: Secondary | ICD-10-CM | POA: Diagnosis not present

## 2023-03-07 DIAGNOSIS — Z7984 Long term (current) use of oral hypoglycemic drugs: Secondary | ICD-10-CM | POA: Insufficient documentation

## 2023-03-07 DIAGNOSIS — L03116 Cellulitis of left lower limb: Secondary | ICD-10-CM | POA: Diagnosis not present

## 2023-03-07 DIAGNOSIS — L97529 Non-pressure chronic ulcer of other part of left foot with unspecified severity: Secondary | ICD-10-CM | POA: Diagnosis not present

## 2023-03-07 DIAGNOSIS — R918 Other nonspecific abnormal finding of lung field: Secondary | ICD-10-CM | POA: Diagnosis not present

## 2023-03-07 DIAGNOSIS — J439 Emphysema, unspecified: Secondary | ICD-10-CM | POA: Diagnosis not present

## 2023-03-07 LAB — URINALYSIS, ROUTINE W REFLEX MICROSCOPIC
Bacteria, UA: NONE SEEN
Bilirubin Urine: NEGATIVE
Glucose, UA: NEGATIVE mg/dL
Hgb urine dipstick: NEGATIVE
Ketones, ur: NEGATIVE mg/dL
Leukocytes,Ua: NEGATIVE
Nitrite: NEGATIVE
Protein, ur: 30 mg/dL — AB
Specific Gravity, Urine: 1.003 — ABNORMAL LOW (ref 1.005–1.030)
pH: 7 (ref 5.0–8.0)

## 2023-03-07 LAB — COMPREHENSIVE METABOLIC PANEL
ALT: 14 U/L (ref 0–44)
AST: 13 U/L — ABNORMAL LOW (ref 15–41)
Albumin: 2.4 g/dL — ABNORMAL LOW (ref 3.5–5.0)
Alkaline Phosphatase: 56 U/L (ref 38–126)
Anion gap: 9 (ref 5–15)
BUN: 19 mg/dL (ref 8–23)
CO2: 25 mmol/L (ref 22–32)
Calcium: 8.4 mg/dL — ABNORMAL LOW (ref 8.9–10.3)
Chloride: 104 mmol/L (ref 98–111)
Creatinine, Ser: 1.6 mg/dL — ABNORMAL HIGH (ref 0.61–1.24)
GFR, Estimated: 49 mL/min — ABNORMAL LOW (ref >60)
Glucose, Bld: 158 mg/dL — ABNORMAL HIGH (ref 70–99)
Potassium: 4 mmol/L (ref 3.5–5.1)
Sodium: 138 mmol/L (ref 135–145)
Total Bilirubin: 0.5 mg/dL (ref <1.2)
Total Protein: 6.1 g/dL — ABNORMAL LOW (ref 6.5–8.1)

## 2023-03-07 LAB — CBC WITH DIFFERENTIAL/PLATELET
Abs Immature Granulocytes: 0.03 10*3/uL (ref 0.00–0.07)
Basophils Absolute: 0 10*3/uL (ref 0.0–0.1)
Basophils Relative: 0 %
Eosinophils Absolute: 0.2 10*3/uL (ref 0.0–0.5)
Eosinophils Relative: 2 %
HCT: 32.7 % — ABNORMAL LOW (ref 39.0–52.0)
Hemoglobin: 10.2 g/dL — ABNORMAL LOW (ref 13.0–17.0)
Immature Granulocytes: 0 %
Lymphocytes Relative: 27 %
Lymphs Abs: 2.6 10*3/uL (ref 0.7–4.0)
MCH: 30.7 pg (ref 26.0–34.0)
MCHC: 31.2 g/dL (ref 30.0–36.0)
MCV: 98.5 fL (ref 80.0–100.0)
Monocytes Absolute: 0.7 10*3/uL (ref 0.1–1.0)
Monocytes Relative: 7 %
Neutro Abs: 6.1 10*3/uL (ref 1.7–7.7)
Neutrophils Relative %: 64 %
Platelets: 168 10*3/uL (ref 150–400)
RBC: 3.32 MIL/uL — ABNORMAL LOW (ref 4.22–5.81)
RDW: 15.1 % (ref 11.5–15.5)
WBC: 9.7 10*3/uL (ref 4.0–10.5)
nRBC: 0 % (ref 0.0–0.2)

## 2023-03-07 LAB — BLOOD GAS, VENOUS
Acid-Base Excess: 2.4 mmol/L — ABNORMAL HIGH (ref 0.0–2.0)
Bicarbonate: 27.2 mmol/L (ref 20.0–28.0)
Drawn by: 51174
O2 Saturation: 96.5 %
Patient temperature: 36.8
pCO2, Ven: 42 mm[Hg] — ABNORMAL LOW (ref 44–60)
pH, Ven: 7.42 (ref 7.25–7.43)
pO2, Ven: 66 mm[Hg] — ABNORMAL HIGH (ref 32–45)

## 2023-03-07 LAB — LACTIC ACID, PLASMA
Lactic Acid, Venous: 1.7 mmol/L (ref 0.5–1.9)
Lactic Acid, Venous: 1.3 mmol/L (ref 0.5–1.9)

## 2023-03-07 LAB — LITHIUM LEVEL: Lithium Lvl: 0.79 mmol/L (ref 0.60–1.20)

## 2023-03-07 LAB — VALPROIC ACID LEVEL: Valproic Acid Lvl: 51 ug/mL (ref 50.0–100.0)

## 2023-03-07 LAB — CBG MONITORING, ED: Glucose-Capillary: 175 mg/dL — ABNORMAL HIGH (ref 70–99)

## 2023-03-07 MED ORDER — GABAPENTIN 100 MG PO CAPS
100.0000 mg | ORAL_CAPSULE | Freq: Every day | ORAL | Status: DC
  Administered 2023-03-07: 100 mg via ORAL
  Filled 2023-03-07: qty 1

## 2023-03-07 MED ORDER — ATORVASTATIN CALCIUM 40 MG PO TABS
40.0000 mg | ORAL_TABLET | Freq: Every day | ORAL | Status: DC
  Administered 2023-03-07 – 2023-03-08 (×2): 40 mg via ORAL
  Filled 2023-03-07 (×2): qty 1

## 2023-03-07 MED ORDER — ALBUTEROL SULFATE HFA 108 (90 BASE) MCG/ACT IN AERS: 2.0000 | INHALATION_SPRAY | Freq: Four times a day (QID) | RESPIRATORY_TRACT | Status: DC | PRN

## 2023-03-07 MED ORDER — INSULIN GLARGINE-YFGN 100 UNIT/ML ~~LOC~~ SOLN
60.0000 [IU] | Freq: Every day | SUBCUTANEOUS | Status: DC
  Administered 2023-03-07: 60 [IU] via SUBCUTANEOUS
  Filled 2023-03-07 (×2): qty 0.6

## 2023-03-07 MED ORDER — AMLODIPINE BESYLATE 5 MG PO TABS
5.0000 mg | ORAL_TABLET | Freq: Every day | ORAL | Status: DC
  Administered 2023-03-07 – 2023-03-08 (×2): 5 mg via ORAL
  Filled 2023-03-07 (×2): qty 1

## 2023-03-07 MED ORDER — METFORMIN HCL 500 MG PO TABS
500.0000 mg | ORAL_TABLET | Freq: Two times a day (BID) | ORAL | Status: DC
  Administered 2023-03-07 – 2023-03-08 (×2): 500 mg via ORAL
  Filled 2023-03-07 (×2): qty 1

## 2023-03-07 MED ORDER — TAMSULOSIN HCL 0.4 MG PO CAPS
0.4000 mg | ORAL_CAPSULE | Freq: Every day | ORAL | Status: DC
  Administered 2023-03-07: 0.4 mg via ORAL
  Filled 2023-03-07: qty 1

## 2023-03-07 MED ORDER — INSULIN GLARGINE-YFGN 100 UNIT/ML ~~LOC~~ SOLN
100.0000 [IU] | Freq: Every day | SUBCUTANEOUS | Status: DC
  Filled 2023-03-07: qty 1

## 2023-03-07 MED ORDER — LITHIUM CARBONATE 150 MG PO CAPS
300.0000 mg | ORAL_CAPSULE | Freq: Two times a day (BID) | ORAL | Status: DC
  Administered 2023-03-07 – 2023-03-08 (×2): 300 mg via ORAL
  Filled 2023-03-07 (×2): qty 2

## 2023-03-07 MED ORDER — LINAGLIPTIN 5 MG PO TABS
5.0000 mg | ORAL_TABLET | Freq: Every day | ORAL | Status: DC
  Administered 2023-03-07 – 2023-03-08 (×2): 5 mg via ORAL
  Filled 2023-03-07 (×2): qty 1

## 2023-03-07 MED ORDER — DIVALPROEX SODIUM 250 MG PO DR TAB
1000.0000 mg | DELAYED_RELEASE_TABLET | Freq: Every day | ORAL | Status: DC
  Administered 2023-03-07: 1000 mg via ORAL
  Filled 2023-03-07: qty 4

## 2023-03-07 MED ORDER — INSULIN GLARGINE-YFGN 100 UNIT/ML ~~LOC~~ SOLN: 60.0000 [IU] | Freq: Every day | SUBCUTANEOUS | Status: DC

## 2023-03-07 MED ORDER — RISPERIDONE 1 MG PO TABS
2.0000 mg | ORAL_TABLET | Freq: Every day | ORAL | Status: DC
  Administered 2023-03-07 – 2023-03-08 (×2): 2 mg via ORAL
  Filled 2023-03-07 (×5): qty 2

## 2023-03-07 NOTE — Discharge Instructions (Addendum)
Patient needs to stay on 2-4 L of oxygen at all times.  Needs to continue the Keflex for the left leg cellulitis.  No acute findings no evidence of any significant infection to the left foot.  No bony involvement.  No evidence of pneumonia.  No evidence urinary tract infection.

## 2023-03-07 NOTE — ED Provider Notes (Addendum)
[place] EMERGENCY DEPARTMENT AT [facility] Provider Note   CSN: [id number] Arrival date & time: 03/07/23  4098     History  Chief Complaint  Patient presents with   Altered Mental Status    More lethargic/drowsy    Scott [name] is a 62 y.o. male.  [facility] [place] for altered mental status.  Patient still has persistent cellulitis to the left leg has a ulcer on the bottom of the foot that they have been shaving back.  Patient was admitted November 5 through November 8.  Patient was admitted for sepsis.  Patient is a full code.  At that time he had severe sepsis secondary to cellulitis and pulmonary source.  He had acute respiratory failure with hypoxia patient not on oxygen currently requiring 6 L of oxygen here to have oxygen in the low 90s.  Charge instructions imply that maybe he was on oxygen at the time of discharge.  Said he was placed on 4 L with saturation of 90 send patient was started on vancomycin cefepime and metronidazole in the ED.  Patient has a history of bipolar disorder and is on lithium and Depakote.  Patient is alert and able to follow commands.  According to nursing facility they feel that his mental status is off.  Patient currently taking Keflex for the left lower extremity cellulitis.  Patient has been receiving that since discharge from hospital.  Past medical history significant for her and acute metabolic encephalopathy left leg cellulitis hyperglycemia due to diabetes mixed hyperlipidemia severe sepsis resolved acute kidney injury bipolar disorder and obesity.       Home Medications Prior to Admission medications   Medication Sig Start Date End Date Taking? Authorizing Provider  acetaminophen (TYLENOL) 325 MG tablet Take 650 mg by mouth every 6 (six) hours as needed.    [provider]  albuterol (VENTOLIN HFA) 108 (90 Base) MCG/ACT inhaler Inhale 2 puffs into the lungs every 6 (six) hours as needed for wheezing or  shortness of breath.    [provider]  amLODipine (NORVASC) 5 MG tablet Take 1 tablet (5 mg total) by mouth daily. 06/15/19   Erick Blinks, MD  atorvastatin (LIPITOR) 40 MG tablet Take 40 mg by mouth daily. 07/10/22   [provider]  benztropine (COGENTIN) 1 MG tablet Take 1 mg by mouth 2 (two) times daily.  11/05/18   [provider]  divalproex (DEPAKOTE) 500 MG DR tablet Take 2 tablets (1,000 mg total) by mouth at bedtime. 02/15/23   Johnson, Maylene Roes, MD  DROPSAFE SAFETY PEN NEEDLES 31G X 6 MM MISC USE FOR ONCE DAILY LANTUS ADMINISTRATION. 03/27/22   Dani Gobble, NP  feeding supplement, GLUCERNA SHAKE, (GLUCERNA SHAKE) LIQD Take 237 mLs by mouth 3 (three) times daily between meals. 02/15/23   Johnson, Clanford L, MD  furosemide (LASIX) 20 MG tablet Take 1 tablet (20 mg total) by mouth daily as needed for edema or fluid (weight gain more than 3 pounds). 02/15/23   Johnson, Clanford L, MD  gabapentin (NEURONTIN) 100 MG capsule Take 100 mg by mouth at bedtime.    [provider]  glucose blood (EASYMAX TEST) test strip USE AS DIRECTED TO CHECK BLOOD SUGAR TWICE DAILY. 09/24/22   Dani Gobble, NP  Insulin Pen Needle (BD AUTOSHIELD DUO) 30G X 5 MM MISC USE FOR ONCE DAILY LANTUS ADMINISTRATION. 07/17/22   Dani Gobble, NP  ipratropium-albuterol (DUONEB) 0.5-2.5 (3) MG/3ML SOLN Take  3 mLs by nebulization every 4 (four) hours as needed (wheezing, shortness of breath). 02/15/23   Cleora Fleet, MD  isopropyl alcohol 70 % SOLN  11/21/20   [provider]  LANTUS SOLOSTAR 100 UNIT/ML Solostar Pen INJECT 60 UNITS SUBCUTANEOUSLY AT BEDTIME. (HOLD IF BS BELOW 60:CALL MD IF BS ABOVE 400) 12/24/22   Reardon, Freddi Starr, NP  linagliptin (TRADJENTA) 5 MG TABS tablet Take 1 tablet (5 mg total) by mouth daily. 05/03/15   [name], Braulio Conte B, MD  lithium carbonate 300 MG capsule Take 300 mg by mouth in the morning and at bedtime.    [provider]  loratadine (CLARITIN) 10 MG tablet Take 10 mg by mouth daily.    [provider]  metFORMIN (GLUCOPHAGE) 1000 MG tablet Take 0.5 tablets (500 mg total) by mouth 2 (two) times daily with a meal. 06/07/22   Dani Gobble, NP  metoprolol tartrate (LOPRESSOR) 25 MG tablet Take 0.5 tablets (12.5 mg total) by mouth 2 (two) times daily. 05/03/15   [name], Jolanta B, MD  risperiDONE (RISPERDAL) 2 MG tablet Take 1 tablet (2 mg total) by mouth daily. 02/15/23   Johnson, Clanford L, MD  silodosin (RAPAFLO) 8 MG CAPS capsule Take 1 capsule (8 mg total) by mouth at bedtime. 07/30/22   [name], Mardene Celeste, MD  tiotropium (SPIRIVA) 18 MCG inhalation capsule Place 1 capsule (18 mcg total) into inhaler and inhale daily. 05/03/15   [name], Braulio Conte B, MD  traZODone (DESYREL) 50 MG tablet Take 50 mg by mouth at bedtime.    [provider]      Allergies    Patient has no known allergies.    Review of Systems   Review of Systems  Unable to perform ROS: Mental status change    Physical Exam Updated Vital Signs BP 127/83   Pulse 81   Temp 99.1 F (37.3 C) (Oral)   Resp 15   Ht 1.88 m (6\' 2" )   Wt 100.2 kg   SpO2 92%   BMI 28.37 kg/m  Physical Exam Vitals and nursing note reviewed.  Constitutional:      General: He is not in acute distress.    Appearance: Normal appearance. He is well-developed.  HENT:     Head: Normocephalic and atraumatic.     Mouth/Throat:     Mouth: Mucous membranes are moist.  Eyes:     Extraocular Movements: Extraocular movements intact.     Conjunctiva/sclera: Conjunctivae normal.     Pupils: Pupils are equal, round, and reactive to light.  Cardiovascular:     Rate and Rhythm: Normal rate and regular rhythm.     Heart sounds: No murmur heard. Pulmonary:     Effort: Pulmonary effort is normal. No respiratory distress.     Breath sounds: Normal breath sounds. No wheezing, rhonchi or rales.  Abdominal:     General: There is distension.      Palpations: Abdomen is soft.     Tenderness: There is no abdominal tenderness. There is no guarding.  Musculoskeletal:        General: No swelling.     Cervical back: Neck supple.     Right lower leg: Edema present.     Left lower leg: Edema present.     Comments: Left lower extremity with erythema more than halfway up the leg anteriorly.  Does have some edema.  Also has left lower foot erythema and an ulcer on the bottom of the foot.  Good  cap refill.  Right lower extremity with edema but no erythema.  Skin:    General: Skin is warm and dry.     Capillary Refill: Capillary refill takes less than 2 seconds.  Neurological:     Mental Status: He is alert. Mental status is at baseline.  Psychiatric:        Mood and Affect: Mood normal.     ED Results / Procedures / Treatments   Labs (all labs ordered are listed, but only abnormal results are displayed) Labs Reviewed  CULTURE, BLOOD (ROUTINE X 2)  CULTURE, BLOOD (ROUTINE X 2)  COMPREHENSIVE METABOLIC PANEL  CBC WITH DIFFERENTIAL/PLATELET  URINALYSIS, ROUTINE W REFLEX MICROSCOPIC  LACTIC ACID, PLASMA  LACTIC ACID, PLASMA  BLOOD GAS, VENOUS  LITHIUM LEVEL  VALPROIC ACID LEVEL    EKG None  Radiology No results found.  Procedures Procedures    Medications Ordered in ED Medications - No data to display  ED Course/ Medical Decision Making/ A&P                                 Medical Decision Making Amount and/or Complexity of Data Reviewed Labs: ordered. Radiology: ordered.  Risk Prescription drug management.   With significant oxygen increased.  With does not meet criteria for sepsis based on vital signs temp was 99 pulse 82 blood pressure 116/71 oxygen sats 90%.  On liters.  He is satting about 92% on room air he was 86%.  Will get labs will get chest x-ray will get x-rays of the left leg and foot.  Will get venous blood gas.  Will follow patient carefully.  Patient may require readmission for the hypoxia.   Best we can tell patient not normally on oxygen.  Was on oxygen during the last hospitalization.  Also check lithium level and Depakote level.  CRITICAL CARE Performed by: Vanetta Mulders Total critical care time: 45 minutes Critical care time was exclusive of separately billable procedures and treating other patients. Critical care was necessary to treat or prevent imminent or life-threatening deterioration. Critical care was time spent personally by me on the following activities: development of treatment plan with patient and/or surrogate as well as nursing, discussions with consultants, evaluation of patient's response to treatment, examination of patient, obtaining history from patient or surrogate, ordering and performing treatments and interventions, ordering and review of laboratory studies, ordering and review of radiographic studies, pulse oximetry and re-evaluation of patient's condition.  Patient has been very stable here on 4 L of oxygen.  Urinalysis negative for urinary tract infection lactic acid was normal blood cultures are sent and pending.  Venous blood gas pH 7.4 pCO2 42 so no evidence of any abnormalities there is lithium level was normal his Depakote level was normal CBC no leukocytosis hemoglobin 10.2 platelets 168.  Left foot no fracture or bone lesion or osteomyelitis.  Same for x-ray of the left tib-fib just shows swelling consistent with cellulitis.  Which we knew the patient had.  Think this cellulitis not significantly worse.  Chest x-ray streaky bibasilar opacities right greater than left favored to represent atelectasis.  Patient in no respiratory distress.  Patient is stable for discharge back to facility needs 4 L of oxygen and needs to continue the Keflex.  In discussion with charge nurse and facility.  They do not have oxygen available to get today but they can arrange that.  Probably can arrange that tomorrow.  Patient will board here in the mean time until he can  have oxygen arranged.  Patient does not really meet criteria for admission.    Final Clinical Impression(s) / ED Diagnoses Final diagnoses:  Altered mental status, unspecified altered mental status type  Left leg cellulitis  Hypoxia    Rx / DC Orders ED Discharge Orders     None         Vanetta Mulders, MD 03/07/23 1240    Vanetta Mulders, MD 03/07/23 1614    Vanetta Mulders, MD 03/07/23 1620    Vanetta Mulders, MD 03/08/23 [phone]

## 2023-03-07 NOTE — ED Notes (Signed)
Pt pulled up in bed Full linen change and gown [place] lines reconnected

## 2023-03-08 ENCOUNTER — Emergency Department (HOSPITAL_COMMUNITY): Payer: Medicare Other

## 2023-03-08 DIAGNOSIS — R4182 Altered mental status, unspecified: Secondary | ICD-10-CM | POA: Diagnosis not present

## 2023-03-08 DIAGNOSIS — I2510 Atherosclerotic heart disease of native coronary artery without angina pectoris: Secondary | ICD-10-CM | POA: Diagnosis not present

## 2023-03-08 DIAGNOSIS — R918 Other nonspecific abnormal finding of lung field: Secondary | ICD-10-CM | POA: Diagnosis not present

## 2023-03-08 DIAGNOSIS — I2699 Other pulmonary embolism without acute cor pulmonale: Secondary | ICD-10-CM | POA: Diagnosis not present

## 2023-03-08 DIAGNOSIS — J439 Emphysema, unspecified: Secondary | ICD-10-CM | POA: Diagnosis not present

## 2023-03-08 LAB — D-DIMER, QUANTITATIVE: D-Dimer, Quant: 0.85 ug{FEU}/mL — ABNORMAL HIGH (ref 0.00–0.50)

## 2023-03-08 MED ORDER — CEPHALEXIN 500 MG PO CAPS
500.0000 mg | ORAL_CAPSULE | Freq: Four times a day (QID) | ORAL | Status: DC
  Administered 2023-03-08 (×2): 500 mg via ORAL
  Filled 2023-03-08 (×2): qty 1

## 2023-03-08 MED ORDER — IOHEXOL 350 MG/ML SOLN
100.0000 mL | Freq: Once | INTRAVENOUS | Status: AC | PRN
  Administered 2023-03-08: 100 mL via INTRAVENOUS

## 2023-03-08 NOTE — Progress Notes (Signed)
SATURATION QUALIFICATIONS: (This note is used to comply with regulatory documentation for home oxygen)  Patient Saturations on Room Air at Rest = 86%  Patient Saturations on Room Air while Ambulating = 90%  Patient Saturations on 2 Liters of oxygen while Ambulating = 93%  Please briefly explain why patient needs home oxygen:  PT experienced tachycardia while ambulating & needed 2 minutes to recover. Pt needs oxygen therapy as needed. Will intermittently desat at rest and with activity.

## 2023-03-08 NOTE — ED Notes (Signed)
Pt provided a sandwich, chips, and diet coke.

## 2023-03-08 NOTE — TOC Transition Note (Addendum)
Transition of Care [facility]) - CM/SW Discharge Note   Patient Details  Name: Scott [name] MRN: [id number] Date of Birth: 1960/05/21  Transition [facility]) CM/SW Contact:  Karn Cassis, LCSW Phone Number: 03/08/2023, 12:39 PM   Clinical Narrative:  Pt will return to [facility] from ED, but will need home O2. LCSW called Yolanda at [employer] [facility] with update. She states to call [facility] for preference on agency for home O2. Yolanda aware of plan to return to [facility] today. Per Tammy at [facility], they request [employer]. Referral faxed and voicemail left for Kim. TOC will follow.    LCSW spoke with Zia at [facility] and they will work with Babette Relic at [facility] to coordinate O2 delivery today. [facility] will pick up pt after O2 delivered. EDP and RN updated. No FL2 needed per Tammy.   Final next level of care: Assisted Living Barriers to Discharge: Barriers Resolved   Patient Goals and CMS Choice      Discharge Placement                    Name of family member notified: Legal guardian Patient and family notified of of transfer: 03/08/23  Discharge Plan and Services Additional resources added to the After Visit Summary for                  DME Arranged: Oxygen DME Agency: [facility] Date DME Agency Contacted: 03/08/23 Time DME Agency Contacted: [id number] Representative spoke with at DME Agency: Selena Batten            Social Determinants of Health (SDOH) Interventions SDOH Screenings   Food Insecurity: No Food Insecurity (02/12/2023)  Housing: Low Risk  (02/12/2023)  Transportation Needs: No Transportation Needs (02/12/2023)  Utilities: Not At Risk (02/12/2023)  Tobacco Use: Low Risk  (01/14/2023)     Readmission Risk Interventions    02/12/2023   10:56 AM  Readmission Risk Prevention Plan  Transportation Screening Complete  HRI or Home Care Consult Complete  Social Work Consult for Recovery Care Planning/Counseling Complete   Palliative Care Screening Not Applicable  Medication Review Oceanographer) Complete

## 2023-03-08 NOTE — Progress Notes (Signed)
Pt will return to [facility] from ED, but will need home O2. LCSW called Yolanda at [facility] with update. She states to call [facility] for preference on agency for home O2. Yolanda aware of plan to return to [facility] today. Per Tammy at [place], they request [employer]. Referral faxed and voicemail left for Kim. TOC will follow.     LCSW spoke with Zia at [facility] and they will work with Babette Relic at [facility] to coordinate O2 delivery today. [facility] will pick up pt after O2 delivered. EDP and RN updated. No FL2 needed per Tammy.

## 2023-03-08 NOTE — Progress Notes (Signed)
Pt now desating. Placed back on 4L [place].

## 2023-03-08 NOTE — ED Notes (Signed)
SATURATION QUALIFICATIONS: (This note is used to comply with regulatory documentation for home oxygen)  Patient Saturations on Room Air at Rest = 96%  Patient Saturations on Room Air while Ambulating = 94%  Patient Saturations on 4 Liters of oxygen while Ambulating = 100%  Please briefly explain why patient needs home oxygen: Pt arrived AMS, after applying oxygen pt returned to baseline. All other labs & test were negative. Needs oxygen per verbal order Dr. Deretha Emory.

## 2023-03-08 NOTE — ED Notes (Signed)
Patient transported to CT 

## 2023-03-08 NOTE — ED Provider Notes (Addendum)
They are making arrangements to try to get oxygen available for him at the nursing facility.  Today patient seems to be doing fairly well off oxygen and walking pretty good.  But certainly seem to needed.  Patient's Keflex started today.  Unfortunately it was not started yesterday.  Today we cannot find his med list from the nursing facility.  D-dimer is elevated at 0.85 so even age corrected it is still elevated.  Patient's renal function GFR was in the 40s.  Will do go ahead and do CT angio to rule out PE.  Obviously if it shows evidence of pulmonary embolus he will require admission.  In retrospect this probably should have been checked yesterday but patient seemed to do so well and we were under the impression that he was discharged on 4 L of oxygen.  But that may have not been the case.   Vanetta Mulders, MD 03/08/23 1610    Vanetta Mulders, MD 03/08/23 [phone]  Results for orders placed or performed during the hospital encounter of 03/07/23  Culture, blood (Routine X 2) w Reflex to ID Panel   Specimen: Left Antecubital; Blood  Result Value Ref Range   Specimen Description      LEFT ANTECUBITAL BOTTLES DRAWN AEROBIC AND ANAEROBIC   Special Requests Blood Culture adequate volume    Culture      NO GROWTH < 24 HOURS Performed at [facility], [address] [place], [place] [address]    Report Status PENDING   Culture, blood (Routine X 2) w Reflex to ID Panel   Specimen: BLOOD RIGHT HAND  Result Value Ref Range   Specimen Description      BLOOD RIGHT HAND BOTTLES DRAWN AEROBIC AND ANAEROBIC   Special Requests Blood Culture adequate volume    Culture      NO GROWTH < 24 HOURS Performed at [facility], [address] [place], [place] [address]    Report Status PENDING   Comprehensive metabolic panel  Result Value Ref Range   Sodium 138 135 - 145 mmol/L   Potassium 4.0 3.5 - 5.1 mmol/L   Chloride 104 98 - 111 mmol/L   CO2 25 22 - 32 mmol/L   Glucose, Bld 158 (H) 70 - 99  mg/dL   BUN 19 8 - 23 mg/dL   Creatinine, Ser 7.82 (H) 0.61 - 1.24 mg/dL   Calcium 8.4 (L) 8.9 - 10.3 mg/dL   Total Protein 6.1 (L) 6.5 - 8.1 g/dL   Albumin 2.4 (L) 3.5 - 5.0 g/dL   AST 13 (L) 15 - 41 U/L   ALT 14 0 - 44 U/L   Alkaline Phosphatase 56 38 - 126 U/L   Total Bilirubin 0.5 <1.2 mg/dL   GFR, Estimated 49 (L) >60 mL/min   Anion gap 9 5 - 15  CBC with Differential/Platelet  Result Value Ref Range   WBC 9.7 4.0 - 10.5 K/uL   RBC 3.32 (L) 4.22 - 5.81 MIL/uL   Hemoglobin 10.2 (L) 13.0 - 17.0 g/dL   HCT 95.6 (L) 21.3 - 08.6 %   MCV 98.5 80.0 - 100.0 fL   MCH 30.7 26.0 - 34.0 pg   MCHC 31.2 30.0 - 36.0 g/dL   RDW 57.8 46.9 - 62.9 %   Platelets 168 150 - 400 K/uL   nRBC 0.0 0.0 - 0.2 %   Neutrophils Relative % 64 %   Neutro Abs 6.1 1.7 - 7.7 K/uL   Lymphocytes Relative 27 %   Lymphs  Abs 2.6 0.7 - 4.0 K/uL   Monocytes Relative 7 %   Monocytes Absolute 0.7 0.1 - 1.0 K/uL   Eosinophils Relative 2 %   Eosinophils Absolute 0.2 0.0 - 0.5 K/uL   Basophils Relative 0 %   Basophils Absolute 0.0 0.0 - 0.1 K/uL   Immature Granulocytes 0 %   Abs Immature Granulocytes 0.03 0.00 - 0.07 K/uL  Urinalysis, Routine w reflex microscopic -Urine, Clean Catch  Result Value Ref Range   Color, Urine COLORLESS (A) YELLOW   APPearance CLEAR CLEAR   Specific Gravity, Urine 1.003 (L) 1.005 - 1.030   pH 7.0 5.0 - 8.0   Glucose, UA NEGATIVE NEGATIVE mg/dL   Hgb urine dipstick NEGATIVE NEGATIVE   Bilirubin Urine NEGATIVE NEGATIVE   Ketones, ur NEGATIVE NEGATIVE mg/dL   Protein, ur 30 (A) NEGATIVE mg/dL   Nitrite NEGATIVE NEGATIVE   Leukocytes,Ua NEGATIVE NEGATIVE   RBC / HPF 0-5 0 - 5 RBC/hpf   WBC, UA 0-5 0 - 5 WBC/hpf   Bacteria, UA NONE SEEN NONE SEEN   Squamous Epithelial / HPF 0-5 0 - 5 /HPF  Lactic acid, plasma  Result Value Ref Range   Lactic Acid, Venous 1.7 0.5 - 1.9 mmol/L  Lactic acid, plasma  Result Value Ref Range   Lactic Acid, Venous 1.3 0.5 - 1.9 mmol/L  Blood gas,  venous (at [facility] and [facility])  Result Value Ref Range   pH, Ven 7.42 7.25 - 7.43   pCO2, Ven 42 (L) 44 - 60 mmHg   pO2, Ven 66 (H) 32 - 45 mmHg   Bicarbonate 27.2 20.0 - 28.0 mmol/L   Acid-Base Excess 2.4 (H) 0.0 - 2.0 mmol/L   O2 Saturation 96.5 %   Patient temperature 36.8    Collection site RIGHT ANTECUBITAL    Drawn by [id number]   Lithium level  Result Value Ref Range   Lithium Lvl 0.79 0.60 - 1.20 mmol/L  Valproic acid level  Result Value Ref Range   Valproic Acid Lvl 51 50.0 - 100.0 ug/mL  D-dimer, quantitative  Result Value Ref Range   D-Dimer, Quant 0.85 (H) 0.00 - 0.50 ug/mL-FEU  CBG monitoring, ED  Result Value Ref Range   Glucose-Capillary 175 (H) 70 - 99 mg/dL   CT Angio Chest PE W/Cm &/Or Wo Cm  Result Date: 03/08/2023 CLINICAL DATA:  Cough with elevated D-dimer levels. Clinical concern for pulmonary embolism. EXAM: CT ANGIOGRAPHY CHEST WITH CONTRAST TECHNIQUE: Multidetector CT imaging of the chest was performed using the standard protocol during bolus administration of intravenous contrast. Multiplanar CT image reconstructions and MIPs were obtained to evaluate the vascular anatomy. RADIATION DOSE REDUCTION: This exam was performed according to the departmental dose-optimization program which includes automated exposure control, adjustment of the mA and/or kV according to patient size and/or use of iterative reconstruction technique. CONTRAST:  OMNIPAQUE IOHEXOL 350 MG/ML SOLN COMPARISON:  Radiographs 03/07/2023. Noncontrast chest CT 02/12/2023 and chest CTA 11/22/2019. FINDINGS: Cardiovascular: The pulmonary arteries are satisfactorily, but not optimally, opacified with contrast to the level of the segmental branches. There is no evidence of acute pulmonary embolism. Mild aortic and coronary artery atherosclerosis. The heart size is normal. There is no pericardial effusion. Mediastinum/Nodes: There are no enlarged mediastinal, hilar or axillary lymph nodes. The thyroid gland,  trachea and esophagus demonstrate no significant findings. Lungs/Pleura: No pleural effusion or pneumothorax. Mild paraseptal emphysema again noted. The left lower lobe airspace disease seen on the previous study has resolved. There is mild  bibasilar atelectasis, but no residual confluent airspace disease or suspicious nodularity. Mild central airway thickening noted. Upper abdomen: The visualized upper abdomen appears stable, without significant findings. Musculoskeletal/Chest wall: There is no chest wall mass or suspicious osseous finding. Review of the MIP images confirms the above findings. IMPRESSION: 1. No evidence of acute pulmonary embolism or other acute chest findings. 2. Interval resolution of left lower lobe airspace disease seen on previous study. Mild bibasilar atelectasis. 3. Mild central airway thickening suggesting bronchitis. 4. Aortic Atherosclerosis (ICD10-I70.0) and Emphysema (ICD10-J43.9). Electronically Signed   By: Carey Bullocks M.D.   On: 03/08/2023 09:34   DG Tibia/Fibula Left  Result Date: 03/07/2023 CLINICAL DATA:  Left leg swelling/cellulitis. EXAM: LEFT TIBIA AND FIBULA - 2 VIEW COMPARISON:  None Available. FINDINGS: No fracture.  No bone lesion. No bone resorption to suggest osteomyelitis. Previous proximal tibia ORIF with a lateral fixation plate and multiple screws, well positioned and well-seated. Narrowing of the knee joint with marginal osteophytes consistent with osteoarthritis. Ankle joint normally spaced and aligned. Diffuse subcutaneous soft tissue edema.  No soft tissue air. IMPRESSION: 1. No fracture or acute bone abnormality. No evidence of osteomyelitis. 2. Diffuse subcutaneous soft tissue edema consistent with cellulitis. No soft tissue air. Electronically Signed   By: Amie Portland M.D.   On: 03/07/2023 13:25   DG Foot Complete Left  Result Date: 03/07/2023 CLINICAL DATA:  Ulcer along the bottom of the foot. EXAM: LEFT FOOT - COMPLETE 3+ VIEW COMPARISON:   01/14/2023. FINDINGS: No fracture or bone lesion. Also projects over the plantar forefoot near the first metatarsophalangeal joint. There is no bone resorption to suggest osteomyelitis. Joints are normally aligned. IMPRESSION: 1. No fracture, bone lesion or evidence of osteomyelitis. 2. Soft tissue ulceration. Electronically Signed   By: Amie Portland M.D.   On: 03/07/2023 13:24   DG Chest Port 1 View  Result Date: 03/07/2023 CLINICAL DATA:  Altered mental status EXAM: PORTABLE CHEST 1 VIEW COMPARISON:  02/12/2023 FINDINGS: The heart size and mediastinal contours are stable. Streaky bibasilar opacities, right greater than left. No pleural effusion or pneumothorax. The visualized skeletal structures are unremarkable. IMPRESSION: Streaky bibasilar opacities, right greater than left, favored to represent atelectasis. Electronically Signed   By: Duanne Guess D.O.   On: 03/07/2023 12:58   CT CHEST WO CONTRAST  Result Date: 02/12/2023 CLINICAL DATA:  Respiratory illness, fever, nondiagnostic x-ray EXAM: CT CHEST WITHOUT CONTRAST TECHNIQUE: Multidetector CT imaging of the chest was performed following the standard protocol without IV contrast. RADIATION DOSE REDUCTION: This exam was performed according to the departmental dose-optimization program which includes automated exposure control, adjustment of the mA and/or kV according to patient size and/or use of iterative reconstruction technique. COMPARISON:  11/22/2019 FINDINGS: Cardiovascular: Aortic atherosclerosis. Normal heart size. Left coronary artery calcifications. No pericardial effusion. Mediastinum/Nodes: No enlarged mediastinal, hilar, or axillary lymph nodes. Thyroid gland, trachea, and esophagus demonstrate no significant findings. Lungs/Pleura: Examination limited by breath motion artifact, particularly in the lung bases. Within this limitation, heterogeneous airspace opacity and consolidation in the left lung base, with scattered ground-glass  airspace opacity in the right lung base (series 3, image 106). Mild paraseptal emphysema. No pleural effusion or pneumothorax. Upper Abdomen: No acute abnormality. Musculoskeletal: No chest wall abnormality. No acute osseous findings. IMPRESSION: 1. Examination limited by breath motion artifact, particularly in the lung bases. Within this limitation, heterogeneous airspace opacity and consolidation in the left lung base, with scattered ground-glass airspace opacity in the right lung base. Findings  are consistent with infection or aspiration. Recommend at minimum radiographic follow-up to resolution to exclude underlying mass in the left lung base. Follow-up CT in 6-8 weeks preferred. 2. Emphysema. 3. Coronary artery disease. Aortic Atherosclerosis (ICD10-I70.0) and Emphysema (ICD10-J43.9). Electronically Signed   By: Jearld Lesch M.D.   On: 02/12/2023 12:51   DG Chest Port 1 View  Result Date: 02/12/2023 CLINICAL DATA:  Possible sepsis. EXAM: PORTABLE CHEST 1 VIEW COMPARISON:  01/25/2022. FINDINGS: The heart size and mediastinal contours are within normal limits. Mild atelectasis is noted at the lung bases. No effusion or pneumothorax is seen. No acute osseous abnormality is identified. IMPRESSION: Mild atelectasis at the lung bases. Electronically Signed   By: Thornell Sartorius M.D.   On: 02/12/2023 00:40     CT angio without evidence of pulmonary embolism.  Also no evidence of pneumonia.  No significant findings on the CT chest.  Patient stable for discharge back.  To nursing facility.   There definitely appears that patient at times needs oxygen currently he is satting 88% on room air at rest.  Patient placed back on oxygen needs anywhere from 2 to 4 L.  We need to make arrangements that he has oxygen at the nursing facility.  I am aware that overnight he went through a period where he was able to ambulate without desatting.  But at rest now he is desatting.  We just did CT angio chest no signs of any  abnormalities.     Vanetta Mulders, MD 03/08/23 [id number]    Vanetta Mulders, MD 03/08/23 1000

## 2023-03-11 DIAGNOSIS — M79674 Pain in right toe(s): Secondary | ICD-10-CM | POA: Diagnosis not present

## 2023-03-11 DIAGNOSIS — L110 Acquired keratosis follicularis: Secondary | ICD-10-CM | POA: Diagnosis not present

## 2023-03-11 DIAGNOSIS — E1140 Type 2 diabetes mellitus with diabetic neuropathy, unspecified: Secondary | ICD-10-CM | POA: Diagnosis not present

## 2023-03-11 DIAGNOSIS — L89892 Pressure ulcer of other site, stage 2: Secondary | ICD-10-CM | POA: Diagnosis not present

## 2023-03-11 DIAGNOSIS — E1142 Type 2 diabetes mellitus with diabetic polyneuropathy: Secondary | ICD-10-CM | POA: Diagnosis not present

## 2023-03-11 DIAGNOSIS — L89893 Pressure ulcer of other site, stage 3: Secondary | ICD-10-CM | POA: Diagnosis not present

## 2023-03-11 DIAGNOSIS — M79671 Pain in right foot: Secondary | ICD-10-CM | POA: Diagnosis not present

## 2023-03-12 LAB — CULTURE, BLOOD (ROUTINE X 2)
Culture: NO GROWTH
Culture: NO GROWTH
Special Requests: ADEQUATE
Special Requests: ADEQUATE

## 2023-03-14 DIAGNOSIS — I129 Hypertensive chronic kidney disease with stage 1 through stage 4 chronic kidney disease, or unspecified chronic kidney disease: Secondary | ICD-10-CM | POA: Diagnosis not present

## 2023-03-14 DIAGNOSIS — Z9181 History of falling: Secondary | ICD-10-CM | POA: Diagnosis not present

## 2023-03-14 DIAGNOSIS — L03116 Cellulitis of left lower limb: Secondary | ICD-10-CM | POA: Diagnosis not present

## 2023-03-14 DIAGNOSIS — Z794 Long term (current) use of insulin: Secondary | ICD-10-CM | POA: Diagnosis not present

## 2023-03-14 DIAGNOSIS — F319 Bipolar disorder, unspecified: Secondary | ICD-10-CM | POA: Diagnosis not present

## 2023-03-14 DIAGNOSIS — E1122 Type 2 diabetes mellitus with diabetic chronic kidney disease: Secondary | ICD-10-CM | POA: Diagnosis not present

## 2023-03-14 DIAGNOSIS — Z7984 Long term (current) use of oral hypoglycemic drugs: Secondary | ICD-10-CM | POA: Diagnosis not present

## 2023-03-14 DIAGNOSIS — N189 Chronic kidney disease, unspecified: Secondary | ICD-10-CM | POA: Diagnosis not present

## 2023-03-14 DIAGNOSIS — L97522 Non-pressure chronic ulcer of other part of left foot with fat layer exposed: Secondary | ICD-10-CM | POA: Diagnosis not present

## 2023-03-14 DIAGNOSIS — E1165 Type 2 diabetes mellitus with hyperglycemia: Secondary | ICD-10-CM | POA: Diagnosis not present

## 2023-03-14 DIAGNOSIS — J449 Chronic obstructive pulmonary disease, unspecified: Secondary | ICD-10-CM | POA: Diagnosis not present

## 2023-03-14 DIAGNOSIS — E11621 Type 2 diabetes mellitus with foot ulcer: Secondary | ICD-10-CM | POA: Diagnosis not present

## 2023-03-20 DIAGNOSIS — E1122 Type 2 diabetes mellitus with diabetic chronic kidney disease: Secondary | ICD-10-CM | POA: Diagnosis not present

## 2023-03-20 DIAGNOSIS — L03116 Cellulitis of left lower limb: Secondary | ICD-10-CM | POA: Diagnosis not present

## 2023-03-20 DIAGNOSIS — I129 Hypertensive chronic kidney disease with stage 1 through stage 4 chronic kidney disease, or unspecified chronic kidney disease: Secondary | ICD-10-CM | POA: Diagnosis not present

## 2023-03-20 DIAGNOSIS — L97522 Non-pressure chronic ulcer of other part of left foot with fat layer exposed: Secondary | ICD-10-CM | POA: Diagnosis not present

## 2023-03-20 DIAGNOSIS — E1165 Type 2 diabetes mellitus with hyperglycemia: Secondary | ICD-10-CM | POA: Diagnosis not present

## 2023-03-20 DIAGNOSIS — E11621 Type 2 diabetes mellitus with foot ulcer: Secondary | ICD-10-CM | POA: Diagnosis not present

## 2023-03-21 DIAGNOSIS — E1165 Type 2 diabetes mellitus with hyperglycemia: Secondary | ICD-10-CM | POA: Diagnosis not present

## 2023-03-21 DIAGNOSIS — F3163 Bipolar disorder, current episode mixed, severe, without psychotic features: Secondary | ICD-10-CM | POA: Diagnosis not present

## 2023-03-21 DIAGNOSIS — N400 Enlarged prostate without lower urinary tract symptoms: Secondary | ICD-10-CM | POA: Diagnosis not present

## 2023-03-21 DIAGNOSIS — J449 Chronic obstructive pulmonary disease, unspecified: Secondary | ICD-10-CM | POA: Diagnosis not present

## 2023-03-21 DIAGNOSIS — Z9981 Dependence on supplemental oxygen: Secondary | ICD-10-CM | POA: Diagnosis not present

## 2023-03-21 DIAGNOSIS — I10 Essential (primary) hypertension: Secondary | ICD-10-CM | POA: Diagnosis not present

## 2023-03-21 DIAGNOSIS — I129 Hypertensive chronic kidney disease with stage 1 through stage 4 chronic kidney disease, or unspecified chronic kidney disease: Secondary | ICD-10-CM | POA: Diagnosis not present

## 2023-03-21 DIAGNOSIS — L03116 Cellulitis of left lower limb: Secondary | ICD-10-CM | POA: Diagnosis not present

## 2023-03-21 DIAGNOSIS — L97522 Non-pressure chronic ulcer of other part of left foot with fat layer exposed: Secondary | ICD-10-CM | POA: Diagnosis not present

## 2023-03-21 DIAGNOSIS — Z1331 Encounter for screening for depression: Secondary | ICD-10-CM | POA: Diagnosis not present

## 2023-03-21 DIAGNOSIS — E11621 Type 2 diabetes mellitus with foot ulcer: Secondary | ICD-10-CM | POA: Diagnosis not present

## 2023-03-21 DIAGNOSIS — E1142 Type 2 diabetes mellitus with diabetic polyneuropathy: Secondary | ICD-10-CM | POA: Diagnosis not present

## 2023-03-21 DIAGNOSIS — N189 Chronic kidney disease, unspecified: Secondary | ICD-10-CM | POA: Diagnosis not present

## 2023-03-21 DIAGNOSIS — Z1389 Encounter for screening for other disorder: Secondary | ICD-10-CM | POA: Diagnosis not present

## 2023-03-21 DIAGNOSIS — E1122 Type 2 diabetes mellitus with diabetic chronic kidney disease: Secondary | ICD-10-CM | POA: Diagnosis not present

## 2023-03-22 DIAGNOSIS — L97522 Non-pressure chronic ulcer of other part of left foot with fat layer exposed: Secondary | ICD-10-CM | POA: Diagnosis not present

## 2023-03-22 DIAGNOSIS — E1165 Type 2 diabetes mellitus with hyperglycemia: Secondary | ICD-10-CM | POA: Diagnosis not present

## 2023-03-22 DIAGNOSIS — E1122 Type 2 diabetes mellitus with diabetic chronic kidney disease: Secondary | ICD-10-CM | POA: Diagnosis not present

## 2023-03-22 DIAGNOSIS — L03116 Cellulitis of left lower limb: Secondary | ICD-10-CM | POA: Diagnosis not present

## 2023-03-22 DIAGNOSIS — E11621 Type 2 diabetes mellitus with foot ulcer: Secondary | ICD-10-CM | POA: Diagnosis not present

## 2023-03-22 DIAGNOSIS — I129 Hypertensive chronic kidney disease with stage 1 through stage 4 chronic kidney disease, or unspecified chronic kidney disease: Secondary | ICD-10-CM | POA: Diagnosis not present

## 2023-03-25 ENCOUNTER — Inpatient Hospital Stay (HOSPITAL_COMMUNITY)
Admission: EM | Admit: 2023-03-25 | Discharge: 2023-04-02 | DRG: 617 | Disposition: A | Discharge status: Home Health | Payer: Medicare Other | Attending: Internal Medicine | Admitting: Internal Medicine

## 2023-03-25 ENCOUNTER — Encounter (HOSPITAL_COMMUNITY): Payer: Self-pay

## 2023-03-25 ENCOUNTER — Other Ambulatory Visit: Payer: Self-pay

## 2023-03-25 ENCOUNTER — Emergency Department (HOSPITAL_COMMUNITY): Payer: Medicare Other

## 2023-03-25 DIAGNOSIS — E1169 Type 2 diabetes mellitus with other specified complication: Secondary | ICD-10-CM | POA: Diagnosis not present

## 2023-03-25 DIAGNOSIS — E785 Hyperlipidemia, unspecified: Secondary | ICD-10-CM | POA: Diagnosis present

## 2023-03-25 DIAGNOSIS — E11649 Type 2 diabetes mellitus with hypoglycemia without coma: Secondary | ICD-10-CM | POA: Diagnosis not present

## 2023-03-25 DIAGNOSIS — Z7984 Long term (current) use of oral hypoglycemic drugs: Secondary | ICD-10-CM | POA: Diagnosis not present

## 2023-03-25 DIAGNOSIS — E1142 Type 2 diabetes mellitus with diabetic polyneuropathy: Secondary | ICD-10-CM | POA: Diagnosis not present

## 2023-03-25 DIAGNOSIS — F3160 Bipolar disorder, current episode mixed, unspecified: Secondary | ICD-10-CM | POA: Diagnosis not present

## 2023-03-25 DIAGNOSIS — M85872 Other specified disorders of bone density and structure, left ankle and foot: Secondary | ICD-10-CM | POA: Diagnosis not present

## 2023-03-25 DIAGNOSIS — M79672 Pain in left foot: Secondary | ICD-10-CM | POA: Diagnosis not present

## 2023-03-25 DIAGNOSIS — F17200 Nicotine dependence, unspecified, uncomplicated: Secondary | ICD-10-CM | POA: Diagnosis not present

## 2023-03-25 DIAGNOSIS — L03116 Cellulitis of left lower limb: Secondary | ICD-10-CM | POA: Diagnosis not present

## 2023-03-25 DIAGNOSIS — M86172 Other acute osteomyelitis, left ankle and foot: Secondary | ICD-10-CM | POA: Diagnosis not present

## 2023-03-25 DIAGNOSIS — L03032 Cellulitis of left toe: Secondary | ICD-10-CM | POA: Diagnosis present

## 2023-03-25 DIAGNOSIS — I10 Essential (primary) hypertension: Secondary | ICD-10-CM | POA: Diagnosis not present

## 2023-03-25 DIAGNOSIS — M79671 Pain in right foot: Secondary | ICD-10-CM | POA: Diagnosis not present

## 2023-03-25 DIAGNOSIS — N401 Enlarged prostate with lower urinary tract symptoms: Secondary | ICD-10-CM | POA: Diagnosis present

## 2023-03-25 DIAGNOSIS — M19072 Primary osteoarthritis, left ankle and foot: Secondary | ICD-10-CM | POA: Diagnosis not present

## 2023-03-25 DIAGNOSIS — M1990 Unspecified osteoarthritis, unspecified site: Secondary | ICD-10-CM | POA: Diagnosis present

## 2023-03-25 DIAGNOSIS — M79674 Pain in right toe(s): Secondary | ICD-10-CM | POA: Diagnosis not present

## 2023-03-25 DIAGNOSIS — L97529 Non-pressure chronic ulcer of other part of left foot with unspecified severity: Secondary | ICD-10-CM | POA: Diagnosis not present

## 2023-03-25 DIAGNOSIS — J449 Chronic obstructive pulmonary disease, unspecified: Secondary | ICD-10-CM | POA: Diagnosis not present

## 2023-03-25 DIAGNOSIS — E1140 Type 2 diabetes mellitus with diabetic neuropathy, unspecified: Secondary | ICD-10-CM | POA: Diagnosis not present

## 2023-03-25 DIAGNOSIS — E11621 Type 2 diabetes mellitus with foot ulcer: Secondary | ICD-10-CM | POA: Diagnosis present

## 2023-03-25 DIAGNOSIS — M79675 Pain in left toe(s): Secondary | ICD-10-CM | POA: Diagnosis not present

## 2023-03-25 DIAGNOSIS — Z8701 Personal history of pneumonia (recurrent): Secondary | ICD-10-CM

## 2023-03-25 DIAGNOSIS — M869 Osteomyelitis, unspecified: Principal | ICD-10-CM

## 2023-03-25 DIAGNOSIS — R338 Other retention of urine: Secondary | ICD-10-CM | POA: Diagnosis present

## 2023-03-25 DIAGNOSIS — Z9981 Dependence on supplemental oxygen: Secondary | ICD-10-CM | POA: Diagnosis not present

## 2023-03-25 DIAGNOSIS — Z800 Family history of malignant neoplasm of digestive organs: Secondary | ICD-10-CM | POA: Diagnosis not present

## 2023-03-25 DIAGNOSIS — Z794 Long term (current) use of insulin: Secondary | ICD-10-CM

## 2023-03-25 DIAGNOSIS — L110 Acquired keratosis follicularis: Secondary | ICD-10-CM | POA: Diagnosis not present

## 2023-03-25 DIAGNOSIS — Z79899 Other long term (current) drug therapy: Secondary | ICD-10-CM

## 2023-03-25 DIAGNOSIS — F79 Unspecified intellectual disabilities: Secondary | ICD-10-CM | POA: Diagnosis not present

## 2023-03-25 LAB — LACTIC ACID, PLASMA
Lactic Acid, Venous: 1.8 mmol/L (ref 0.5–1.9)
Lactic Acid, Venous: 1.1 mmol/L (ref 0.5–1.9)

## 2023-03-25 LAB — COMPREHENSIVE METABOLIC PANEL
ALT: 12 U/L (ref 0–44)
AST: 14 U/L — ABNORMAL LOW (ref 15–41)
Albumin: 2.6 g/dL — ABNORMAL LOW (ref 3.5–5.0)
Alkaline Phosphatase: 52 U/L (ref 38–126)
Anion gap: 11 (ref 5–15)
BUN: 25 mg/dL — ABNORMAL HIGH (ref 8–23)
CO2: 25 mmol/L (ref 22–32)
Calcium: 8.8 mg/dL — ABNORMAL LOW (ref 8.9–10.3)
Chloride: 103 mmol/L (ref 98–111)
Creatinine, Ser: 1.83 mg/dL — ABNORMAL HIGH (ref 0.61–1.24)
GFR, Estimated: 41 mL/min — ABNORMAL LOW (ref >60)
Glucose, Bld: 132 mg/dL — ABNORMAL HIGH (ref 70–99)
Potassium: 4.1 mmol/L (ref 3.5–5.1)
Sodium: 139 mmol/L (ref 135–145)
Total Bilirubin: 0.4 mg/dL (ref <1.2)
Total Protein: 7.5 g/dL (ref 6.5–8.1)

## 2023-03-25 LAB — GLUCOSE, CAPILLARY
Glucose-Capillary: 110 mg/dL — ABNORMAL HIGH (ref 70–99)
Glucose-Capillary: 185 mg/dL — ABNORMAL HIGH (ref 70–99)

## 2023-03-25 LAB — CBC WITH DIFFERENTIAL/PLATELET
Abs Immature Granulocytes: 0.05 10*3/uL (ref 0.00–0.07)
Basophils Absolute: 0.1 10*3/uL (ref 0.0–0.1)
Basophils Relative: 1 %
Eosinophils Absolute: 0.1 10*3/uL (ref 0.0–0.5)
Eosinophils Relative: 1 %
HCT: 35.8 % — ABNORMAL LOW (ref 39.0–52.0)
Hemoglobin: 10.8 g/dL — ABNORMAL LOW (ref 13.0–17.0)
Immature Granulocytes: 1 %
Lymphocytes Relative: 26 %
Lymphs Abs: 2.6 10*3/uL (ref 0.7–4.0)
MCH: 30.2 pg (ref 26.0–34.0)
MCHC: 30.2 g/dL (ref 30.0–36.0)
MCV: 100 fL (ref 80.0–100.0)
Monocytes Absolute: 0.5 10*3/uL (ref 0.1–1.0)
Monocytes Relative: 5 %
Neutro Abs: 6.5 10*3/uL (ref 1.7–7.7)
Neutrophils Relative %: 66 %
Platelets: 395 10*3/uL (ref 150–400)
RBC: 3.58 MIL/uL — ABNORMAL LOW (ref 4.22–5.81)
RDW: 15.4 % (ref 11.5–15.5)
WBC: 9.9 10*3/uL (ref 4.0–10.5)
nRBC: 0 % (ref 0.0–0.2)

## 2023-03-25 LAB — SEDIMENTATION RATE: Sed Rate: 1 mm/h (ref 0–16)

## 2023-03-25 LAB — C-REACTIVE PROTEIN: CRP: 1.3 mg/dL — ABNORMAL HIGH (ref <1.0)

## 2023-03-25 MED ORDER — VANCOMYCIN HCL 500 MG/100ML IV SOLN
500.0000 mg | Freq: Once | INTRAVENOUS | Status: AC
  Administered 2023-03-25: 500 mg via INTRAVENOUS
  Filled 2023-03-25: qty 100

## 2023-03-25 MED ORDER — LITHIUM CARBONATE 300 MG PO CAPS
300.0000 mg | ORAL_CAPSULE | Freq: Two times a day (BID) | ORAL | Status: DC
  Administered 2023-03-25 – 2023-04-02 (×15): 300 mg via ORAL
  Filled 2023-03-25: qty 1
  Filled 2023-03-25 (×2): qty 2
  Filled 2023-03-25 (×2): qty 1
  Filled 2023-03-25: qty 2
  Filled 2023-03-25 (×11): qty 1

## 2023-03-25 MED ORDER — DIVALPROEX SODIUM 250 MG PO DR TAB
1000.0000 mg | DELAYED_RELEASE_TABLET | Freq: Every day | ORAL | Status: DC
  Administered 2023-03-25 – 2023-04-01 (×8): 1000 mg via ORAL
  Filled 2023-03-25 (×8): qty 4

## 2023-03-25 MED ORDER — VANCOMYCIN HCL 1750 MG/350ML IV SOLN
1750.0000 mg | INTRAVENOUS | Status: DC
  Filled 2023-03-25 (×2): qty 350

## 2023-03-25 MED ORDER — DIVALPROEX SODIUM 250 MG PO DR TAB
500.0000 mg | DELAYED_RELEASE_TABLET | Freq: Every day | ORAL | Status: DC
  Administered 2023-03-26 – 2023-04-02 (×7): 500 mg via ORAL
  Filled 2023-03-25 (×7): qty 2

## 2023-03-25 MED ORDER — RISPERIDONE 2 MG PO TABS
2.0000 mg | ORAL_TABLET | Freq: Every day | ORAL | Status: DC
  Administered 2023-03-26 – 2023-04-02 (×7): 2 mg via ORAL
  Filled 2023-03-25: qty 1
  Filled 2023-03-25: qty 2
  Filled 2023-03-25 (×6): qty 1

## 2023-03-25 MED ORDER — LORATADINE 10 MG PO TABS
10.0000 mg | ORAL_TABLET | Freq: Every day | ORAL | Status: DC
  Administered 2023-03-26 – 2023-04-02 (×7): 10 mg via ORAL
  Filled 2023-03-25 (×7): qty 1

## 2023-03-25 MED ORDER — SODIUM CHLORIDE 0.9 % IV SOLN: Freq: Once | INTRAVENOUS | Status: AC

## 2023-03-25 MED ORDER — LACTATED RINGERS IV BOLUS (SEPSIS)
500.0000 mL | Freq: Once | INTRAVENOUS | Status: AC
  Administered 2023-03-25: 500 mL via INTRAVENOUS

## 2023-03-25 MED ORDER — TIOTROPIUM BROMIDE MONOHYDRATE 2.5 MCG/ACT IN AERS: 2.0000 | INHALATION_SPRAY | Freq: Every day | RESPIRATORY_TRACT | Status: DC

## 2023-03-25 MED ORDER — HEPARIN SODIUM (PORCINE) 5000 UNIT/ML IJ SOLN
5000.0000 [IU] | Freq: Three times a day (TID) | INTRAMUSCULAR | Status: DC
  Administered 2023-03-25 – 2023-04-02 (×20): 5000 [IU] via SUBCUTANEOUS
  Filled 2023-03-25 (×20): qty 1

## 2023-03-25 MED ORDER — BENZTROPINE MESYLATE 0.5 MG PO TABS
1.0000 mg | ORAL_TABLET | Freq: Two times a day (BID) | ORAL | Status: DC
  Administered 2023-03-25 – 2023-04-02 (×15): 1 mg via ORAL
  Filled 2023-03-25 (×6): qty 2
  Filled 2023-03-25 (×2): qty 1
  Filled 2023-03-25: qty 2
  Filled 2023-03-25: qty 1
  Filled 2023-03-25 (×5): qty 2

## 2023-03-25 MED ORDER — ACETAMINOPHEN 325 MG PO TABS: 650.0000 mg | ORAL_TABLET | Freq: Four times a day (QID) | ORAL | Status: DC | PRN

## 2023-03-25 MED ORDER — ADULT MULTIVITAMIN W/MINERALS CH
1.0000 | ORAL_TABLET | Freq: Every day | ORAL | Status: DC
  Administered 2023-03-26 – 2023-04-02 (×7): 1 via ORAL
  Filled 2023-03-25 (×7): qty 1

## 2023-03-25 MED ORDER — SODIUM CHLORIDE 0.9 % IV SOLN
2.0000 g | Freq: Once | INTRAVENOUS | Status: AC
Start: 2023-03-25 — End: 2023-03-25
  Administered 2023-03-25: 2 g via INTRAVENOUS
  Filled 2023-03-25: qty 12.5

## 2023-03-25 MED ORDER — VITAMIN C 500 MG PO TABS
500.0000 mg | ORAL_TABLET | Freq: Every day | ORAL | Status: DC
  Administered 2023-03-26 – 2023-03-28 (×3): 500 mg via ORAL
  Filled 2023-03-25 (×3): qty 1

## 2023-03-25 MED ORDER — VANCOMYCIN HCL IN DEXTROSE 1-5 GM/200ML-% IV SOLN
1000.0000 mg | INTRAVENOUS | Status: AC
Start: 2023-03-25 — End: 2023-03-25
  Administered 2023-03-25 (×2): 1000 mg via INTRAVENOUS
  Filled 2023-03-25 (×2): qty 200

## 2023-03-25 MED ORDER — INSULIN GLARGINE-YFGN 100 UNIT/ML ~~LOC~~ SOLN
35.0000 [IU] | Freq: Every day | SUBCUTANEOUS | Status: DC
  Administered 2023-03-25 – 2023-04-01 (×8): 35 [IU] via SUBCUTANEOUS
  Filled 2023-03-25 (×10): qty 0.35

## 2023-03-25 MED ORDER — AMLODIPINE BESYLATE 5 MG PO TABS
5.0000 mg | ORAL_TABLET | Freq: Every day | ORAL | Status: DC
  Administered 2023-03-26 – 2023-04-02 (×7): 5 mg via ORAL
  Filled 2023-03-25 (×7): qty 1

## 2023-03-25 MED ORDER — GABAPENTIN 100 MG PO CAPS
100.0000 mg | ORAL_CAPSULE | Freq: Every day | ORAL | Status: DC
  Administered 2023-03-25 – 2023-04-01 (×8): 100 mg via ORAL
  Filled 2023-03-25 (×8): qty 1

## 2023-03-25 MED ORDER — TRAZODONE HCL 50 MG PO TABS
50.0000 mg | ORAL_TABLET | Freq: Every day | ORAL | Status: DC
  Administered 2023-03-25 – 2023-04-01 (×8): 50 mg via ORAL
  Filled 2023-03-25 (×8): qty 1

## 2023-03-25 MED ORDER — TAMSULOSIN HCL 0.4 MG PO CAPS
0.4000 mg | ORAL_CAPSULE | Freq: Every day | ORAL | Status: DC
  Administered 2023-03-25 – 2023-04-01 (×8): 0.4 mg via ORAL
  Filled 2023-03-25 (×8): qty 1

## 2023-03-25 MED ORDER — IPRATROPIUM-ALBUTEROL 0.5-2.5 (3) MG/3ML IN SOLN: 3.0000 mL | Freq: Four times a day (QID) | RESPIRATORY_TRACT | Status: DC | PRN

## 2023-03-25 MED ORDER — ACETAMINOPHEN 650 MG RE SUPP: 650.0000 mg | Freq: Four times a day (QID) | RECTAL | Status: DC | PRN

## 2023-03-25 MED ORDER — GADOBUTROL 1 MMOL/ML IV SOLN
10.0000 mL | Freq: Once | INTRAVENOUS | Status: AC | PRN
  Administered 2023-03-25: 10 mL via INTRAVENOUS

## 2023-03-25 MED ORDER — ATORVASTATIN CALCIUM 40 MG PO TABS
40.0000 mg | ORAL_TABLET | Freq: Every day | ORAL | Status: DC
  Administered 2023-03-26 – 2023-04-02 (×7): 40 mg via ORAL
  Filled 2023-03-25 (×7): qty 1

## 2023-03-25 MED ORDER — ONDANSETRON HCL 4 MG PO TABS: 4.0000 mg | ORAL_TABLET | Freq: Four times a day (QID) | ORAL | Status: DC | PRN

## 2023-03-25 MED ORDER — INSULIN ASPART 100 UNIT/ML IJ SOLN
0.0000 [IU] | Freq: Three times a day (TID) | INTRAMUSCULAR | Status: DC
  Administered 2023-03-26: 3 [IU] via SUBCUTANEOUS
  Administered 2023-03-26: 1 [IU] via SUBCUTANEOUS
  Administered 2023-03-27: 3 [IU] via SUBCUTANEOUS
  Administered 2023-03-27 – 2023-03-28 (×4): 2 [IU] via SUBCUTANEOUS
  Administered 2023-03-29: 1 [IU] via SUBCUTANEOUS
  Administered 2023-03-30 – 2023-04-01 (×7): 2 [IU] via SUBCUTANEOUS
  Administered 2023-04-01: 1 [IU] via SUBCUTANEOUS
  Administered 2023-04-01: 3 [IU] via SUBCUTANEOUS
  Administered 2023-04-02: 2 [IU] via SUBCUTANEOUS

## 2023-03-25 MED ORDER — METOPROLOL TARTRATE 12.5 MG HALF TABLET
12.5000 mg | ORAL_TABLET | Freq: Two times a day (BID) | ORAL | Status: DC
  Administered 2023-03-25 – 2023-04-02 (×16): 12.5 mg via ORAL
  Filled 2023-03-25 (×16): qty 1

## 2023-03-25 MED ORDER — INSULIN ASPART 100 UNIT/ML IJ SOLN
0.0000 [IU] | Freq: Every day | INTRAMUSCULAR | Status: DC
  Administered 2023-03-29 – 2023-03-30 (×2): 2 [IU] via SUBCUTANEOUS
  Administered 2023-04-01: 4 [IU] via SUBCUTANEOUS

## 2023-03-25 MED ORDER — UMECLIDINIUM BROMIDE 62.5 MCG/ACT IN AEPB
1.0000 | INHALATION_SPRAY | Freq: Every day | RESPIRATORY_TRACT | Status: AC
Start: 2023-03-25 — End: ?
  Administered 2023-03-26 – 2023-04-02 (×7): 1 via RESPIRATORY_TRACT
  Filled 2023-03-25 (×2): qty 7

## 2023-03-25 MED ORDER — ONDANSETRON HCL 4 MG/2ML IJ SOLN: 4.0000 mg | Freq: Four times a day (QID) | INTRAMUSCULAR | Status: DC | PRN
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
## 2023-03-25 NOTE — ED Notes (Signed)
Report given to and accepted by receiving nurse

## 2023-03-25 NOTE — Consult Note (Signed)
Pharmacy Antibiotic Note  ASSESSMENT: 62 y.o. male with PMH including diabetes and diabetic foot ulcer is presenting with  worsening diabetic ulcer on left foot . Reportedly has had the wound for three months. There is concern for osteomyelitis given new evidence on foot XR. Pharmacy has been consulted to manage vancomycin dosing.  Patient with elevated serum creatinine however it is consistent with past several creatinine labs. Will continue to monitor.  Patient measurements: Height: 6\' 2"  (188 cm) Weight: 100.2 kg (221 lb) IBW/kg (Calculated) : 82.2  Vital signs: Temp: 98.1 F (36.7 C) (12/16 1103) Temp Source: Oral (12/16 1103) BP: 140/87 (12/16 1103) Pulse Rate: 91 (12/16 1103) Recent Labs  Lab 03/25/23 1145  WBC 9.9  CREATININE 1.83*   Estimated Creatinine Clearance: 53.6 mL/min (A) (by C-G formula based on SCr of 1.83 mg/dL (H)).  Allergies: No Known Allergies  Antimicrobials this admission: Cefepime 12/16 >>  Vancomycin 12/16 >>  Dose adjustments this admission: N/A  Microbiology results: N/A  PLAN: Administer vancomycin 2500 mg IV x 1 as a loading dose, followed by 1750 mg IV q24H thereafter eAUC 535, Cmax 37, Cmin 13.5 Scr 1.83, IBW, Vd 0.72 L/kg Follow up any culture results to assess for antibiotic optimization. Monitor renal function to assess for any necessary antibiotic dosing changes.   Thank you for allowing pharmacy to be a part of this patient's care.  Will M. Dareen Piano, PharmD Clinical Pharmacist 03/25/2023 1:39 PM

## 2023-03-25 NOTE — ED Triage Notes (Signed)
Pt presents w/ increasing diabetic ulcer on L foot x3 months.  Denies pain.    Pt has been followed by PCP and Podiatrist Graciela Husbands) and has taken several antibiotics.  Caregiver reports cellulitis in anterior shin looks a bit better.
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
## 2023-03-25 NOTE — ED Provider Notes (Signed)
[facility] AT [facility] Provider Note   CSN: [id number] Arrival date & time: 03/25/23  1025     History  Chief Complaint  Patient presents with   Wound Check    Scott [name] is a 62 y.o. male.   Wound Check  Patient presents for foot ulcer.  Medical history includes bipolar disorder, depression, DM, HTN, HLD, COPD.  Ulcer on plantar aspect of left foot has been present for the past 3 months.  6 weeks ago, he was admitted for sepsis.  Presumed source with cellulitis with left leg erythema and foot ulcer.  He was treated initially with vancomycin and ceftriaxone.  He was discharged on an additional 3 days of doxycycline.  2 weeks ago, he underwent a 7-day course of Keflex.  2 weeks ago, he underwent removal of surrounding callus.  He is followed by Dr. Georgia Lopes, podiatrist, who he saw earlier today.  His podiatrist felt that wound appeared worse.  There was also a foul smelling drainage noted.  He was sent to the ED for further evaluation.  Patient states that the ulcer is not painful to him.  He denies any recent systemic symptoms.     Home Medications Prior to Admission medications   Medication Sig Start Date End Date Taking? Authorizing Provider  acetaminophen (TYLENOL) 325 MG tablet Take 650 mg by mouth every 6 (six) hours as needed.    [provider]  albuterol (VENTOLIN HFA) 108 (90 Base) MCG/ACT inhaler Inhale 2 puffs into the lungs every 6 (six) hours as needed for wheezing or shortness of breath.    [provider]  amLODipine (NORVASC) 5 MG tablet Take 1 tablet (5 mg total) by mouth daily. 06/15/19   Erick Blinks, MD  ascorbic acid (VITAMIN C) 500 MG tablet Take 500 mg by mouth daily.  04/07/23  [provider]  atorvastatin (LIPITOR) 40 MG tablet Take 40 mg by mouth daily. 07/10/22   [provider]  benztropine (COGENTIN) 1 MG tablet Take 1 mg by mouth 2 (two) times daily.  11/05/18   [provider]  divalproex (DEPAKOTE) 500 MG DR tablet Take 500-1,000 mg by mouth 2 (two) times daily. Take 1 tab am and 2 tabs pm    [provider]  DROPSAFE SAFETY PEN NEEDLES 31G X 6 MM MISC USE FOR ONCE DAILY LANTUS ADMINISTRATION. 03/27/22   Dani Gobble, NP  furosemide (LASIX) 20 MG tablet Take 1 tablet (20 mg total) by mouth daily as needed for edema or fluid (weight gain more than 3 pounds). Patient taking differently: Take 20 mg by mouth daily. 02/15/23   Johnson, Clanford L, MD  gabapentin (NEURONTIN) 100 MG capsule Take 100 mg by mouth at bedtime.    [provider]  glucose blood (EASYMAX TEST) test strip USE AS DIRECTED TO CHECK BLOOD SUGAR TWICE DAILY. 09/24/22   Dani Gobble, NP  Insulin Pen Needle (BD AUTOSHIELD DUO) 30G X 5 MM MISC USE FOR ONCE DAILY LANTUS ADMINISTRATION. 07/17/22   Dani Gobble, NP  ipratropium-albuterol (DUONEB) 0.5-2.5 (3) MG/3ML SOLN Take 3 mLs by nebulization every 4 (four) hours as needed (wheezing, shortness of breath). 02/15/23   Cleora Fleet, MD  isopropyl alcohol 70 % SOLN  11/21/20   [provider]  LANTUS SOLOSTAR 100 UNIT/ML Solostar Pen INJECT 60 UNITS SUBCUTANEOUSLY AT BEDTIME. (HOLD IF BS BELOW 60:CALL MD IF BS ABOVE 400) 12/24/22   Dani Gobble, NP  linagliptin (TRADJENTA) 5 MG TABS tablet Take 1 tablet (5 mg total) by mouth daily. 05/03/15   [name], Braulio Conte B, MD  lithium carbonate 300 MG capsule Take 300 mg by mouth in the morning and at bedtime.    [provider]  loratadine (CLARITIN) 10 MG tablet Take 10 mg by mouth daily.    [provider]  metFORMIN (GLUCOPHAGE) 500 MG tablet Take 500 mg by mouth 2 (two) times daily. 02/12/23   [provider]  metoprolol tartrate (LOPRESSOR) 25 MG tablet Take 0.5 tablets (12.5 mg total) by mouth 2 (two) times daily. Patient taking differently: Take 25 mg by mouth 2 (two) times daily. 05/03/15   [name], Ellin Goodie, MD   Multiple Vitamin (DAILY-VITE) TABS Take 1 tablet by mouth daily. 03/04/23   [provider]  nystatin powder Apply 1 Application topically 3 (three) times daily. Apply to groin area three times daily    [provider]  risperiDONE (RISPERDAL) 2 MG tablet Take 1 tablet (2 mg total) by mouth daily. 02/15/23   Johnson, Clanford L, MD  SPIRIVA RESPIMAT 2.5 MCG/ACT AERS Inhale 2 puffs into the lungs daily. 03/04/23   [provider]  tamsulosin (FLOMAX) 0.4 MG CAPS capsule Take 0.4 mg by mouth daily after supper.    [provider]  traZODone (DESYREL) 50 MG tablet Take 50 mg by mouth at bedtime.    [provider]  Zinc Sulfate 220 (50 Zn) MG TABS Take 1 tablet by mouth daily. 03/04/23   [provider]      Allergies    Patient has no known allergies.    Review of Systems   Review of Systems  Skin:  Positive for wound.  All other systems reviewed and are negative.   Physical Exam Updated Vital Signs BP (!) 148/74   Pulse 84   Temp 98.1 F (36.7 C) (Oral)   Resp 15   Ht 6\' 2"  (1.88 m)   Wt 100.2 kg   SpO2 96%   BMI 28.37 kg/m  Physical Exam Vitals and nursing note reviewed.  Constitutional:      General: He is not in acute distress.    Appearance: Normal appearance. He is well-developed. He is not ill-appearing, toxic-appearing or diaphoretic.  HENT:     Head: Normocephalic and atraumatic.     Right Ear: External ear normal.     Left Ear: External ear normal.     Nose: Nose normal.     Mouth/Throat:     Mouth: Mucous membranes are moist.  Eyes:     Extraocular Movements: Extraocular movements intact.     Conjunctiva/sclera: Conjunctivae normal.  Cardiovascular:     Rate and Rhythm: Normal rate and regular rhythm.  Pulmonary:     Effort: Pulmonary effort is normal. No respiratory distress.  Abdominal:     General: There is no distension.     Palpations: Abdomen is soft.  Musculoskeletal:        General: No  swelling or deformity.     Cervical back: Neck supple.     Right lower leg: No edema.     Left lower leg: No edema.     Comments: Ulcer to plantar aspect of left foot.  Skin:    General: Skin is warm and dry.     Coloration: Skin is not jaundiced or pale.  Neurological:     General: No focal deficit present.     Mental Status: He is alert and oriented  to person, place, and time.  Psychiatric:        Mood and Affect: Mood normal.        Behavior: Behavior normal.     ED Results / Procedures / Treatments   Labs (all labs ordered are listed, but only abnormal results are displayed) Labs Reviewed  COMPREHENSIVE METABOLIC PANEL - Abnormal; Notable for the following components:      Result Value   Glucose, Bld 132 (*)    BUN 25 (*)    Creatinine, Ser 1.83 (*)    Calcium 8.8 (*)    Albumin 2.6 (*)    AST 14 (*)    GFR, Estimated 41 (*)    All other components within normal limits  CBC WITH DIFFERENTIAL/PLATELET - Abnormal; Notable for the following components:   RBC 3.58 (*)    Hemoglobin 10.8 (*)    HCT 35.8 (*)    All other components within normal limits  LACTIC ACID, PLASMA  LACTIC ACID, PLASMA  SEDIMENTATION RATE  C-REACTIVE PROTEIN    EKG None  Radiology MR FOOT LEFT W WO CONTRAST Result Date: 03/25/2023 CLINICAL DATA:  Left foot soft tissue infection. Concern for osteomyelitis. EXAM: MRI OF THE LEFT FOREFOOT WITHOUT AND WITH CONTRAST TECHNIQUE: Multiplanar, multisequence MR imaging of the left foot was performed both before and after administration of intravenous contrast. CONTRAST:  10mL GADAVIST GADOBUTROL 1 MMOL/ML IV SOLN COMPARISON:  Same-day radiographs of the left foot at 11:29 a.m. FINDINGS: Bones/Joint/Cartilage The medial hallux sesamoid demonstrates marrow signal abnormality with corresponding enhancement on postcontrast sequences, consistent with osteomyelitis. Degenerative changes of the first interphalangeal joint with joint space narrowing, marginal  osteophytosis, and regions of cortical irregularity. There is T2 hyperintense marrow edema with corresponding T1 hypointense marrow signal abnormality and enhancement on postcontrast imaging sequences involving the first distal phalanx, most pronounced proximally at the level of the first interphalangeal joint (series 6, image 27 and series 11, image 27). Mild degenerative changes at the first MTP joint. Subchondral cystic change of the talus at the talonavicular articulation. Ligaments Collateral ligaments are intact.  Lisfranc ligament is intact. Muscles and Tendons Flexor, peroneal and extensor compartment tendons are intact. No evidence of tenosynovitis. Muscles are normal. Soft tissue Soft tissue ulceration at the plantar base of the first metatarsal head, overlying the medial hallux sesamoid. There is surrounding soft tissue edema and enhancement. No rim enhancing collection identified. IMPRESSION: 1. Osteomyelitis of the medial hallux sesamoid with overlying plantar soft tissue ulceration and associated cellulitis. No fluid collection identified. 2. Marrow signal abnormality with corresponding enhancement involving the first distal phalanx, most pronounced proximally at the level of the first interphalangeal joint. These findings are concerning for osteomyelitis/septic arthritis. Electronically Signed   By: Hart Robinsons M.D.   On: 03/25/2023 14:53   DG Foot 2 Views Left Result Date: 03/25/2023 CLINICAL DATA:  Nonhealing plantar foot ulcer. EXAM: LEFT FOOT - 2 VIEW COMPARISON:  Left foot x-rays dated March 07, 2023. FINDINGS: Soft tissue ulceration at the plantar base of the first metatarsal head appears slightly increased in size compared to prior study. There is new cortical indistinctness of the underlying medial hallux sesamoid. No acute fracture or dislocation. Similar first MTP joint degenerative changes. Osteopenia. IMPRESSION: 1. Soft tissue ulceration at the plantar base of the first  metatarsal head with new cortical indistinctness of the underlying medial hallux sesamoid, concerning for osteomyelitis. Electronically Signed   By: Obie Dredge M.D.   On: 03/25/2023 11:53  Procedures Procedures    Medications Ordered in ED Medications  vancomycin (VANCOCIN) IVPB 1000 mg/200 mL premix (1,000 mg Intravenous New Bag/Given 03/25/23 1458)  vancomycin (VANCOREADY) IVPB 1750 mg/350 mL (has no administration in time range)  vancomycin (VANCOREADY) IVPB 500 mg/100 mL (has no administration in time range)  0.9 %  sodium chloride infusion (0 mLs Intravenous Hold 03/25/23 1501)  amLODipine (NORVASC) tablet 5 mg (has no administration in time range)  ascorbic acid (VITAMIN C) tablet 500 mg (has no administration in time range)  atorvastatin (LIPITOR) tablet 40 mg (has no administration in time range)  benztropine (COGENTIN) tablet 1 mg (has no administration in time range)  divalproex (DEPAKOTE) DR tablet 500-1,000 mg (has no administration in time range)  gabapentin (NEURONTIN) capsule 100 mg (has no administration in time range)  ipratropium-albuterol (DUONEB) 0.5-2.5 (3) MG/3ML nebulizer solution 3 mL (has no administration in time range)  lithium carbonate capsule 300 mg (has no administration in time range)  risperiDONE (RISPERDAL) tablet 2 mg (has no administration in time range)  Tiotropium Bromide Monohydrate AERS 2 puff (has no administration in time range)  tamsulosin (FLOMAX) capsule 0.4 mg (has no administration in time range)  Daily-Vite TABS 1 tablet (has no administration in time range)  metoprolol tartrate (LOPRESSOR) tablet 12.5 mg (has no administration in time range)  loratadine (CLARITIN) tablet 10 mg (has no administration in time range)  traZODone (DESYREL) tablet 50 mg (has no administration in time range)  insulin glargine-yfgn (SEMGLEE) injection 35 Units (has no administration in time range)  insulin aspart (novoLOG) injection 0-9 Units (has no  administration in time range)  insulin aspart (novoLOG) injection 0-5 Units (has no administration in time range)  lactated ringers bolus 500 mL (0 mLs Intravenous Stopped 03/25/23 1457)  gadobutrol (GADAVIST) 1 MMOL/ML injection 10 mL (10 mLs Intravenous Contrast Given 03/25/23 1213)  ceFEPIme (MAXIPIME) 2 g in sodium chloride 0.9 % 100 mL IVPB (0 g Intravenous Stopped 03/25/23 1353)    ED Course/ Medical Decision Making/ A&P                                 Medical Decision Making Amount and/or Complexity of Data Reviewed Labs: ordered. Radiology: ordered. ECG/medicine tests: ordered.  Risk Prescription drug management. Decision regarding hospitalization.   This patient presents to the ED for concern of foot wound, this involves an extensive number of treatment options, and is a complaint that carries with it a high risk of complications and morbidity.  The differential diagnosis includes wound infection, osteomyelitis, nonhealing diabetic ulcer, PAD   Co morbidities that complicate the patient evaluation  bipolar disorder, depression, DM, HTN, HLD, COPD   Additional history obtained:  Additional history obtained from patient's caregiver External records from outside source obtained and reviewed including EMR   Lab Tests:  I Ordered, and personally interpreted labs.  The pertinent results include:   baseline anemia, no leukocytosis, baseline creatinine, normal electrolytes   Imaging Studies ordered:  I ordered imaging studies including left foot x-ray and MRI  I independently visualized and interpreted imaging which showed  osteomyelitis with concern of possible septic arthritis of first interphalangeal joint I agree with the radiologist interpretation   Cardiac Monitoring: / EKG:  The patient was maintained on a cardiac monitor.  I personally viewed and interpreted the cardiac monitored which showed an underlying rhythm of: Sinus rhythm   Consultations  Obtained:  I requested consultation  with the orthopedic surgeon, Dr. Romeo Apple,  and discussed lab and imaging findings as well as pertinent plan - they recommend: Patient can be admitted here at [facility] for antibiotics.  Dr. Romeo Apple does not do procedures for septic arthritis or amputation.  If he needed this procedure, he would be able to go to [facility] for day trip for procedure with return.  This would be discussed with Dr. Lajoyce Corners.   Problem List / ED Course / Critical interventions / Medication management  Patient presents for nonhealing ulcer to plantar aspect of left foot with new onset of foul-smelling drainage.  He has not been on any antibiotics for the past 2 weeks.  Area has not been painful to him, likely secondary to his neuropathy.  On arrival in the ED, vital signs are normal.  Patient is overall well-appearing.  Dressing on wound was removed.  No current drainage is appreciated.  Workup was initiated.  X-ray shows concern of osteomyelitis.  MRI was ordered for further evaluation.  Patient was started on antibiotics.  I spoke with orthopedic surgeon, Dr. Romeo Apple, who stated that he does not do procedures for septic joint for partial amputation.  If he needed this procedure, he would need trip to [facility].  Patient was admitted to medicine for further management. I ordered medication including vancomycin, cefepime for foot wound, osteomyelitis Reevaluation of the patient after these medicines showed that the patient stayed the same I have reviewed the patients home medicines and have made adjustments as needed   Social Determinants of Health:  Has caregiver         Final Clinical Impression(s) / ED Diagnoses Final diagnoses:  Osteomyelitis of left foot, unspecified type [facility])    Rx / DC Orders ED Discharge Orders     None         Gloris Manchester, MD 03/25/23 [phone]

## 2023-03-25 NOTE — Plan of Care (Signed)

## 2023-03-26 ENCOUNTER — Inpatient Hospital Stay (HOSPITAL_COMMUNITY): Payer: Medicare Other

## 2023-03-26 DIAGNOSIS — M86172 Other acute osteomyelitis, left ankle and foot: Secondary | ICD-10-CM | POA: Diagnosis not present

## 2023-03-26 LAB — BASIC METABOLIC PANEL
Anion gap: 6 (ref 5–15)
BUN: 21 mg/dL (ref 8–23)
CO2: 27 mmol/L (ref 22–32)
Calcium: 8.7 mg/dL — ABNORMAL LOW (ref 8.9–10.3)
Chloride: 109 mmol/L (ref 98–111)
Creatinine, Ser: 1.62 mg/dL — ABNORMAL HIGH (ref 0.61–1.24)
GFR, Estimated: 48 mL/min — ABNORMAL LOW (ref >60)
Glucose, Bld: 105 mg/dL — ABNORMAL HIGH (ref 70–99)
Potassium: 4.8 mmol/L (ref 3.5–5.1)
Sodium: 142 mmol/L (ref 135–145)

## 2023-03-26 LAB — GLUCOSE, CAPILLARY
Glucose-Capillary: 88 mg/dL (ref 70–99)
Glucose-Capillary: 141 mg/dL — ABNORMAL HIGH (ref 70–99)
Glucose-Capillary: 242 mg/dL — ABNORMAL HIGH (ref 70–99)
Glucose-Capillary: 200 mg/dL — ABNORMAL HIGH (ref 70–99)

## 2023-03-26 MED ORDER — VANCOMYCIN HCL 750 MG/150ML IV SOLN
750.0000 mg | INTRAVENOUS | Status: DC
  Administered 2023-03-26: 750 mg via INTRAVENOUS
  Filled 2023-03-26: qty 150

## 2023-03-26 MED ORDER — VANCOMYCIN HCL IN DEXTROSE 1-5 GM/200ML-% IV SOLN
1000.0000 mg | INTRAVENOUS | Status: DC
  Administered 2023-03-26: 1000 mg via INTRAVENOUS
  Filled 2023-03-26: qty 200

## 2023-03-26 NOTE — Progress Notes (Signed)
   03/26/23 1236  TOC Brief Assessment  Insurance and Status Reviewed  Patient has primary care physician Yes  Home environment has been reviewed [place]  Prior level of function: ALF  Prior/Current Home Services Current home services (home oxygen - [place])  Social Drivers of Health Review SDOH reviewed no interventions necessary  Readmission risk has been reviewed Yes  Transition of care needs no transition of care needs at this time   Patient being evaluated, may need to transfer to [facility] for Surgical consult. TOC following.

## 2023-03-26 NOTE — Plan of Care (Signed)
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
## 2023-03-26 NOTE — Progress Notes (Signed)
  Progress Note   Patient: Scott [name] MVH:[id number] DOB: 1960-08-25 DOA: 03/25/2023     1 DOS: the patient was seen and examined on 03/26/2023   Brief hospital course: ADREW DUNST is a 62 y.o. male with medical history significant of type 2 diabetes, hyperlipidemia, bipolar disorder, COPD and TBI who presented from podiatrist office secondary to worsening left foot diabetic ulcer.  Patient also have been present for over 3 months and he has received multiple courses of antibiotics and also local debridement by podiatrist without resolution or signs of healing.  Patient is afebrile, no nausea, no vomiting, no chest pain or shortness of breath.   Wound demonstrating foul smell and active intermittent drainage.   X-ray and MRI demonstrating presence of osteomyelitis.  IV antibiotics has been initiated and TRH consulted to place patient in the hospital for further evaluation and management.  Assessment and Plan: 1-diabetic foot ulcer/osteomyelitis -X-ray/MRI confirming osteomyelitis presents on patient's left plantar diabetic foot ulcer -Continue IV antibiotics -After discussing with general surgery patient will require first ray amputation which is something no perform by on-call surgeon. -Case discussed with Dr. Georgia Lopes office (patient's podiatrist) and they do not perform that type of surgical intervention either. -I have contacted Dr. Lajoyce Corners orthopedic surgeon in [place] who will be more than happy to see the patient in consultation and provide adequate/appropriate surgical intervention needed -Patient has been transferred to [facility] for further evaluation and management.  2-type 2 diabetes with neuropathy -Recent A1c 7.3 -Continue sliding scale insulin and Semglee -Follow CBGs and adjust hypoglycemic regimen as needed -Continue Neurontin.  3-hypertension -Continue current antihypertensive agent -Stable vital signs appreciated. -Heart healthy diet discussed  with patient.  4-history of BPH -Continue treatment with Flomax -No complaints of urinary retention currently.  5-hyperlipidemia -Continue treatment with Lipitor.  6-history of bipolar disorder -Continue constant reorientation and supportive care -Continue treatment with Risperdal, lithium, Depakote and benztropine.  Subjective:  Afebrile, expressing no pain in his left foot and in no acute distress.  Physical Exam: Vitals:   03/26/23 0501 03/26/23 0755 03/26/23 0937 03/26/23 1353  BP: 109/63  127/82 122/75  Pulse: 68  75 74  Resp: 18  20 18   Temp: 98.6 F (37 C)  98.2 F (36.8 C) 98.4 F (36.9 C)  TempSrc: Oral   Oral  SpO2: 95% 96% 92% 99%  Weight:      Height:       General exam: Alert, awake, following commands appropriately and in no acute distress.  Currently afebrile. Respiratory system: Clear to auscultation. Respiratory effort normal.  Good saturation on room air. Cardiovascular system:RRR. No rubs or gallops; no JVD. Gastrointestinal system: Abdomen is nondistended, soft and nontender. No organomegaly or masses felt. Normal bowel sounds heard. Central nervous system: Moving 4 limbs spontaneously; no focal neurological deficits. Extremities/skin: No cyanosis or clubbing; left plantar diabetic ulcer with active drainage and surrounding erythema appreciated. Psychiatry: Mood & affect appropriate.   Data Reviewed: Basic metabolic panel: Sodium 142, potassium 4.8, chloride 109, bicarb 27, BUN 21, creatinine 1.62 and GFR 48  Family Communication: No family at bedside.  Disposition: Status is: Inpatient Remains inpatient appropriate because: Continue IV antibiotics.   Planned Discharge Destination: Skilled nursing facility  Time spent: 50 minutes  Author: Vassie Loll, MD 03/26/2023 3:43 PM  For on call review [email or web address].

## 2023-03-27 DIAGNOSIS — F3160 Bipolar disorder, current episode mixed, unspecified: Secondary | ICD-10-CM

## 2023-03-27 DIAGNOSIS — M86172 Other acute osteomyelitis, left ankle and foot: Secondary | ICD-10-CM | POA: Diagnosis not present

## 2023-03-27 LAB — GLUCOSE, CAPILLARY
Glucose-Capillary: 105 mg/dL — ABNORMAL HIGH (ref 70–99)
Glucose-Capillary: 176 mg/dL — ABNORMAL HIGH (ref 70–99)
Glucose-Capillary: 209 mg/dL — ABNORMAL HIGH (ref 70–99)
Glucose-Capillary: 194 mg/dL — ABNORMAL HIGH (ref 70–99)

## 2023-03-27 MED ORDER — VANCOMYCIN HCL 10 G IV SOLR
1750.0000 mg | INTRAVENOUS | Status: AC
  Administered 2023-03-27: 1749.978 mg via INTRAVENOUS
  Administered 2023-03-28 – 2023-03-30 (×3): 1750 mg via INTRAVENOUS
  Filled 2023-03-27: qty 1750
  Filled 2023-03-27 (×2): qty 17.5
  Filled 2023-03-27: qty 1750
  Filled 2023-03-27: qty 17.5

## 2023-03-27 MED ORDER — POLYETHYLENE GLYCOL 3350 17 G PO PACK
17.0000 g | PACK | Freq: Two times a day (BID) | ORAL | Status: AC
  Administered 2023-03-27 – 2023-03-28 (×4): 17 g via ORAL
  Filled 2023-03-27 (×4): qty 1

## 2023-03-27 MED ORDER — INSULIN ASPART 100 UNIT/ML IJ SOLN
4.0000 [IU] | Freq: Three times a day (TID) | INTRAMUSCULAR | Status: DC
  Administered 2023-03-27 – 2023-04-02 (×16): 4 [IU] via SUBCUTANEOUS
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
## 2023-03-27 NOTE — Progress Notes (Signed)
[facility] PROGRESS NOTE    Progress Note  MAHAMED [name]  ZOX:[id number] DOB: 08-25-1960 DOA: 03/25/2023 PCP: Benetta Spar, MD     Brief Narrative:   Scott [name] is an 62 y.o. male past medical history significant for diabetes mellitus type 2, hyperlipidemia bipolar disorder comes in from the podiatrist office to [facility] for for secondary diabetic left foot ulcer MRI showed osteomyelitis orthopedic surgery was consulted who recommended transfer to [facility]  Assessment/Plan:   Acute osteomyelitis of metatarsal bone of left foot (HCC) MRI of the foot showed osteomyelitis of the medial hallux with ulceration and cellulitis. Podiatry was consulted who recommended first ray amputation. Dr. Lajoyce Corners was consulted recommended transfer to [facility].  Further evaluation.  Diabetes mellitus type 2 with peripheral neuropathy: With last A1c of 7.3, long-acting insulin plus sliding scale blood glucose fairly controlled. Add meal coverage for better control.  Essential hypertension: Pressures relatively well-controlled. Continue metoprolol.  History of BPH: Continue Flomax.  Hyperlipidemia: Continue statins.  Bipolar 1 disorder, mixed (HCC) Continue risperidone, lithium, Depakote and benztropine  Tobacco use disorder Counseling.   DVT prophylaxis: lovenox Family Communication:none Status is: Inpatient Remains inpatient appropriate because: Acute osteomyelitis    Code Status:     Code Status Orders  (From admission, onward)           Start     Ordered   03/25/23 1529  Full code  Continuous       Question:  By:  Answer:  Consent: discussion documented in EHR   03/25/23 1528           Code Status History     Date Active Date Inactive Code Status Order ID Comments User Context   02/12/2023 0404 02/15/2023 2201 Full Code [id number]  Frankey Shown, DO ED   11/22/2019 0508 11/24/2019 1957 Full Code [id number]  Frankey Shown, DO  ED   06/11/2019 0925 06/15/2019 1744 Full Code [id number]  Kendell Bane, MD Inpatient   03/09/2019 0625 03/13/2019 1941 Full Code [id number]  Bobette Mo, MD ED   03/09/2019 0411 03/09/2019 0625 Full Code [id number]  Bobette Mo, MD ED   04/27/2015 2025 05/04/2015 1433 Full Code [id number]  Clapacs, Jackquline Denmark, MD Inpatient   04/05/2015 1816 04/09/2015 1732 Full Code [id number]  Houston Siren, MD Inpatient      Advance Directive Documentation    Flowsheet Row Most Recent Value  Type of Advance Directive Healthcare Power of Attorney  Pre-existing out of facility DNR order (yellow form or pink MOST form) --  "MOST" Form in Place? --         IV Access:   Peripheral IV   Procedures and diagnostic studies:   US ARTERIAL ABI (SCREENING LOWER EXTREMITY) Result Date: 03/26/2023 CLINICAL DATA:  Left foot osteomyelitis EXAM: NONINVASIVE PHYSIOLOGIC VASCULAR STUDY OF BILATERAL LOWER EXTREMITIES TECHNIQUE: Evaluation of both lower extremities were performed at rest, including calculation of ankle-brachial indices with single level Doppler, pressure and pulse volume recording. COMPARISON:  None Available. FINDINGS: Right ABI:  1.09 Left ABI:  1.03 Right Lower Extremity:  Normal arterial waveforms at the ankle. Left Lower Extremity:  Normal arterial waveforms at the ankle. 1.0-1.4 Normal IMPRESSION: Normal bilateral resting ankle-brachial indices. Signed, Sterling Big, MD, RPVI [facility] Radiology Electronically Signed   By: Malachy Moan M.D.   On: 03/26/2023 11:26   MR FOOT LEFT W WO CONTRAST Result Date: 03/25/2023 CLINICAL DATA:  Left foot soft  tissue infection. Concern for osteomyelitis. EXAM: MRI OF THE LEFT FOREFOOT WITHOUT AND WITH CONTRAST TECHNIQUE: Multiplanar, multisequence MR imaging of the left foot was performed both before and after administration of intravenous contrast. CONTRAST:  10mL GADAVIST GADOBUTROL 1 MMOL/ML  IV SOLN COMPARISON:  Same-day radiographs of the left foot at 11:29 a.m. FINDINGS: Bones/Joint/Cartilage The medial hallux sesamoid demonstrates marrow signal abnormality with corresponding enhancement on postcontrast sequences, consistent with osteomyelitis. Degenerative changes of the first interphalangeal joint with joint space narrowing, marginal osteophytosis, and regions of cortical irregularity. There is T2 hyperintense marrow edema with corresponding T1 hypointense marrow signal abnormality and enhancement on postcontrast imaging sequences involving the first distal phalanx, most pronounced proximally at the level of the first interphalangeal joint (series 6, image 27 and series 11, image 27). Mild degenerative changes at the first MTP joint. Subchondral cystic change of the talus at the talonavicular articulation. Ligaments Collateral ligaments are intact.  Lisfranc ligament is intact. Muscles and Tendons Flexor, peroneal and extensor compartment tendons are intact. No evidence of tenosynovitis. Muscles are normal. Soft tissue Soft tissue ulceration at the plantar base of the first metatarsal head, overlying the medial hallux sesamoid. There is surrounding soft tissue edema and enhancement. No rim enhancing collection identified. IMPRESSION: 1. Osteomyelitis of the medial hallux sesamoid with overlying plantar soft tissue ulceration and associated cellulitis. No fluid collection identified. 2. Marrow signal abnormality with corresponding enhancement involving the first distal phalanx, most pronounced proximally at the level of the first interphalangeal joint. These findings are concerning for osteomyelitis/septic arthritis. Electronically Signed   By: Hart Robinsons M.D.   On: 03/25/2023 14:53   DG Foot 2 Views Left Result Date: 03/25/2023 CLINICAL DATA:  Nonhealing plantar foot ulcer. EXAM: LEFT FOOT - 2 VIEW COMPARISON:  Left foot x-rays dated March 07, 2023. FINDINGS: Soft tissue ulceration at  the plantar base of the first metatarsal head appears slightly increased in size compared to prior study. There is new cortical indistinctness of the underlying medial hallux sesamoid. No acute fracture or dislocation. Similar first MTP joint degenerative changes. Osteopenia. IMPRESSION: 1. Soft tissue ulceration at the plantar base of the first metatarsal head with new cortical indistinctness of the underlying medial hallux sesamoid, concerning for osteomyelitis. Electronically Signed   By: Obie Dredge M.D.   On: 03/25/2023 11:53     Medical Consultants:   None.   Subjective:    Nyquan Pursifull Pugmire he relates his pain is controlled.  Objective:    Vitals:   03/26/23 2035 03/26/23 2202 03/27/23 0100 03/27/23 0219  BP: (!) 141/74 129/86  120/82  Pulse: 73 85  70  Resp: 20 18  17   Temp: 98.3 F (36.8 C) 98.2 F (36.8 C)  97.7 F (36.5 C)  TempSrc: Oral Oral  Oral  SpO2: 98% 95%  96%  Weight:   95.1 kg   Height:       SpO2: 96 % O2 Flow Rate (L/min): 2 L/min   Intake/Output Summary (Last 24 hours) at 03/27/2023 0726 Last data filed at 03/27/2023 0600 Gross per 24 hour  Intake 1020.85 ml  Output 2925 ml  Net -1904.15 ml   Filed Weights   03/25/23 1103 03/25/23 1628 03/27/23 0100  Weight: 100.2 kg 95.4 kg 95.1 kg    Exam: General exam: In no acute distress. Respiratory system: Good air movement and clear to auscultation. Cardiovascular system: S1 & S2 heard, RRR.  Gastrointestinal system: Abdomen is nondistended, soft and nontender.  Extremities: No pedal edema.  Skin: No rashes, lesions or ulcers Psychiatry: Judgement and insight appear normal. Mood & affect appropriate.    Data Reviewed:    Labs: Basic Metabolic Panel: Recent Labs  Lab 03/25/23 1145 03/26/23 0419  NA 139 142  K 4.1 4.8  CL 103 109  CO2 25 27  GLUCOSE 132* 105*  BUN 25* 21  CREATININE 1.83* 1.62*  CALCIUM 8.8* 8.7*   GFR Estimated Creatinine Clearance: 55.7 mL/min (A) (by C-G  formula based on SCr of 1.62 mg/dL (H)). Liver Function Tests: Recent Labs  Lab 03/25/23 1145  AST 14*  ALT 12  ALKPHOS 52  BILITOT 0.4  PROT 7.5  ALBUMIN 2.6*   No results for input(s): "LIPASE", "AMYLASE" in the last 168 hours. No results for input(s): "AMMONIA" in the last 168 hours. Coagulation profile No results for input(s): "INR", "PROTIME" in the last 168 hours. COVID-19 Labs  Recent Labs    03/25/23 1725  CRP 1.3*    Lab Results  Component Value Date   SARSCOV2NAA NEGATIVE 02/12/2023   SARSCOV2NAA NEGATIVE 12/23/2021   SARSCOV2NAA NEGATIVE 11/22/2019   SARSCOV2NAA NEGATIVE 06/10/2019    CBC: Recent Labs  Lab 03/25/23 1145  WBC 9.9  NEUTROABS 6.5  HGB 10.8*  HCT 35.8*  MCV 100.0  PLT 395   Cardiac Enzymes: No results for input(s): "CKTOTAL", "CKMB", "CKMBINDEX", "TROPONINI" in the last 168 hours. BNP (last 3 results) No results for input(s): "PROBNP" in the last 8760 hours. CBG: Recent Labs  Lab 03/25/23 2115 03/26/23 0725 03/26/23 1121 03/26/23 1625 03/26/23 2037  GLUCAP 185* 88 141* 242* 200*   D-Dimer: No results for input(s): "DDIMER" in the last 72 hours. Hgb A1c: No results for input(s): "HGBA1C" in the last 72 hours. Lipid Profile: No results for input(s): "CHOL", "HDL", "LDLCALC", "TRIG", "CHOLHDL", "LDLDIRECT" in the last 72 hours. Thyroid function studies: No results for input(s): "TSH", "T4TOTAL", "T3FREE", "THYROIDAB" in the last 72 hours.  Invalid input(s): "FREET3" Anemia work up: No results for input(s): "VITAMINB12", "FOLATE", "FERRITIN", "TIBC", "IRON", "RETICCTPCT" in the last 72 hours. Sepsis Labs: Recent Labs  Lab 03/25/23 1145 03/25/23 1303  WBC 9.9  --   LATICACIDVEN 1.8 1.1   Microbiology Recent Results (from the past 240 hours)  Culture, blood (Routine X 2) w Reflex to ID Panel     Status: None (Preliminary result)   Collection Time: 03/25/23  6:58 PM   Specimen: BLOOD  Result Value Ref Range Status    Specimen Description BLOOD RIGHT ANTECUBITAL  Final   Special Requests   Final    BOTTLES DRAWN AEROBIC AND ANAEROBIC Blood Culture adequate volume   Culture   Final    NO GROWTH 2 DAYS Performed at [facility], [address] [place], [place] [address]    Report Status PENDING  Incomplete  Culture, blood (Routine X 2) w Reflex to ID Panel     Status: None (Preliminary result)   Collection Time: 03/25/23  7:03 PM   Specimen: BLOOD  Result Value Ref Range Status   Specimen Description BLOOD LEFT ANTECUBITAL  Final   Special Requests   Final    BOTTLES DRAWN AEROBIC AND ANAEROBIC Blood Culture adequate volume   Culture   Final    NO GROWTH 2 DAYS Performed at [facility], [address], [place], [place] [address]    Report Status PENDING  Incomplete     Medications:    amLODipine  5 mg Oral Daily   ascorbic acid  500 mg  Oral Daily   atorvastatin  40 mg Oral Daily   benztropine  1 mg Oral BID   divalproex  1,000 mg Oral QHS   divalproex  500 mg Oral Daily   gabapentin  100 mg Oral QHS   heparin  5,000 Units Subcutaneous Q8H   insulin aspart  0-5 Units Subcutaneous QHS   insulin aspart  0-9 Units Subcutaneous TID WC   insulin glargine-yfgn  35 Units Subcutaneous QHS   lithium carbonate  300 mg Oral BID WC   loratadine  10 mg Oral Daily   metoprolol tartrate  12.5 mg Oral BID   multivitamin with minerals  1 tablet Oral Daily   risperiDONE  2 mg Oral Daily   tamsulosin  0.4 mg Oral QPC supper   traZODone  50 mg Oral QHS   umeclidinium bromide  1 puff Inhalation Daily   Continuous Infusions:  vancomycin 1,000 mg (03/26/23 1526)   vancomycin 750 mg (03/26/23 1647)      LOS: 2 days   Marinda Elk  [facility]  03/27/2023, 7:26 AM
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
## 2023-03-27 NOTE — Plan of Care (Signed)

## 2023-03-28 DIAGNOSIS — M86172 Other acute osteomyelitis, left ankle and foot: Secondary | ICD-10-CM | POA: Diagnosis not present

## 2023-03-28 LAB — GLUCOSE, CAPILLARY
Glucose-Capillary: 153 mg/dL — ABNORMAL HIGH (ref 70–99)
Glucose-Capillary: 192 mg/dL — ABNORMAL HIGH (ref 70–99)
Glucose-Capillary: 162 mg/dL — ABNORMAL HIGH (ref 70–99)
Glucose-Capillary: 197 mg/dL — ABNORMAL HIGH (ref 70–99)

## 2023-03-28 LAB — BASIC METABOLIC PANEL
Anion gap: 6 (ref 5–15)
BUN: 23 mg/dL (ref 8–23)
CO2: 23 mmol/L (ref 22–32)
Calcium: 8.7 mg/dL — ABNORMAL LOW (ref 8.9–10.3)
Chloride: 110 mmol/L (ref 98–111)
Creatinine, Ser: 1.64 mg/dL — ABNORMAL HIGH (ref 0.61–1.24)
GFR, Estimated: 47 mL/min — ABNORMAL LOW (ref >60)
Glucose, Bld: 170 mg/dL — ABNORMAL HIGH (ref 70–99)
Potassium: 4.6 mmol/L (ref 3.5–5.1)
Sodium: 139 mmol/L (ref 135–145)

## 2023-03-28 MED ORDER — CEFAZOLIN SODIUM-DEXTROSE 2-4 GM/100ML-% IV SOLN
2.0000 g | INTRAVENOUS | Status: AC
  Administered 2023-03-29: 2 g via INTRAVENOUS
  Filled 2023-03-28: qty 100

## 2023-03-28 MED ORDER — POVIDONE-IODINE 10 % EX SWAB: 2.0000 | Freq: Once | CUTANEOUS | Status: DC

## 2023-03-28 MED ORDER — CHLORHEXIDINE GLUCONATE 4 % EX SOLN
60.0000 mL | Freq: Once | CUTANEOUS | Status: AC
Start: 2023-03-29 — End: 2023-03-29
  Administered 2023-03-29: 4 via TOPICAL
  Filled 2023-03-28: qty 15
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
## 2023-03-28 NOTE — Progress Notes (Signed)
[facility] PROGRESS NOTE    Progress Note  Scott [name]  ONG:[id number] DOB: 12-Jul-1960 DOA: 03/25/2023 PCP: Benetta Spar, MD     Brief Narrative:   Scott [name] is an 62 y.o. male past medical history significant for diabetes mellitus type 2, hyperlipidemia bipolar disorder comes in from the podiatrist office to [facility] for for secondary diabetic left foot ulcer MRI showed osteomyelitis orthopedic surgery was consulted who recommended transfer to [facility]  Assessment/Plan:   Acute osteomyelitis of metatarsal bone of left foot (HCC) MRI of the foot showed osteomyelitis of the medial hallux with ulceration and cellulitis. Orthopedic surgery was consulted recommended left foot first ray amputation on 03/29/2023. Continue Tylenol for pain, currently on a bowel regimen.  Diabetes mellitus type 2 with peripheral neuropathy: A1c of 7.3, blood glucose improved continue long-acting insulin plus sliding scale.  Essential hypertension: Pressures relatively well-controlled. Continue metoprolol.  History of BPH: Continue Flomax.  Hyperlipidemia: Continue statins.  Bipolar 1 disorder, mixed (HCC) Continue risperidone, lithium, Depakote and benztropine  Tobacco use disorder Counseling.   DVT prophylaxis: lovenox Family Communication:none Status is: Inpatient Remains inpatient appropriate because: Acute osteomyelitis    Code Status:     Code Status Orders  (From admission, onward)           Start     Ordered   03/25/23 1529  Full code  Continuous       Question:  By:  Answer:  Consent: discussion documented in EHR   03/25/23 1528           Code Status History     Date Active Date Inactive Code Status Order ID Comments User Context   02/12/2023 0404 02/15/2023 2201 Full Code [id number]  Frankey Shown, DO ED   11/22/2019 0508 11/24/2019 1957 Full Code [id number]  Frankey Shown, DO ED   06/11/2019 0925 06/15/2019 1744 Full  Code [id number]  Kendell Bane, MD Inpatient   03/09/2019 0625 03/13/2019 1941 Full Code [id number]  Bobette Mo, MD ED   03/09/2019 0411 03/09/2019 0625 Full Code [id number]  Bobette Mo, MD ED   04/27/2015 2025 05/04/2015 1433 Full Code [id number]  Clapacs, Jackquline Denmark, MD Inpatient   04/05/2015 1816 04/09/2015 1732 Full Code [id number]  Houston Siren, MD Inpatient      Advance Directive Documentation    Flowsheet Row Most Recent Value  Type of Advance Directive Healthcare Power of Attorney  Pre-existing out of facility DNR order (yellow form or pink MOST form) --  "MOST" Form in Place? --         IV Access:   Peripheral IV   Procedures and diagnostic studies:   US ARTERIAL ABI (SCREENING LOWER EXTREMITY) Result Date: 03/26/2023 CLINICAL DATA:  Left foot osteomyelitis EXAM: NONINVASIVE PHYSIOLOGIC VASCULAR STUDY OF BILATERAL LOWER EXTREMITIES TECHNIQUE: Evaluation of both lower extremities were performed at rest, including calculation of ankle-brachial indices with single level Doppler, pressure and pulse volume recording. COMPARISON:  None Available. FINDINGS: Right ABI:  1.09 Left ABI:  1.03 Right Lower Extremity:  Normal arterial waveforms at the ankle. Left Lower Extremity:  Normal arterial waveforms at the ankle. 1.0-1.4 Normal IMPRESSION: Normal bilateral resting ankle-brachial indices. Signed, Sterling Big, MD, RPVI [facility] Radiology Electronically Signed   By: Malachy Moan M.D.   On: 03/26/2023 11:26     Medical Consultants:   None.   Subjective:    [name] he relates his pain is  controlled.  Objective:    Vitals:   03/27/23 1614 03/27/23 2148 03/28/23 0821 03/28/23 0842  BP: 126/78 138/81 125/86   Pulse: 77 85 72   Resp: 16 20 16    Temp: 98.7 F (37.1 C) 97.9 F (36.6 C) 97.7 F (36.5 C)   TempSrc: Oral Oral Oral   SpO2: 96% 95% 100% 95%  Weight:      Height:       SpO2:  95 % O2 Flow Rate (L/min): 2 L/min   Intake/Output Summary (Last 24 hours) at 03/28/2023 1038 Last data filed at 03/28/2023 0608 Gross per 24 hour  Intake 517 ml  Output 2275 ml  Net -1758 ml   Filed Weights   03/25/23 1103 03/25/23 1628 03/27/23 0100  Weight: 100.2 kg 95.4 kg 95.1 kg    Exam: General exam: In no acute distress. Respiratory system: Good air movement and clear to auscultation. Cardiovascular system: S1 & S2 heard, RRR. No JVD. Gastrointestinal system: Abdomen is nondistended, soft and nontender.  Extremities: No pedal edema. Skin: No rashes, lesions or ulcers Psychiatry: Judgement and insight appear normal. Mood & affect appropriate.  Data Reviewed:    Labs: Basic Metabolic Panel: Recent Labs  Lab 03/25/23 1145 03/26/23 0419 03/28/23 0635  NA 139 142 139  K 4.1 4.8 4.6  CL 103 109 110  CO2 25 27 23   GLUCOSE 132* 105* 170*  BUN 25* 21 23  CREATININE 1.83* 1.62* 1.64*  CALCIUM 8.8* 8.7* 8.7*   GFR Estimated Creatinine Clearance: 55 mL/min (A) (by C-G formula based on SCr of 1.64 mg/dL (H)). Liver Function Tests: Recent Labs  Lab 03/25/23 1145  AST 14*  ALT 12  ALKPHOS 52  BILITOT 0.4  PROT 7.5  ALBUMIN 2.6*   No results for input(s): "LIPASE", "AMYLASE" in the last 168 hours. No results for input(s): "AMMONIA" in the last 168 hours. Coagulation profile No results for input(s): "INR", "PROTIME" in the last 168 hours. COVID-19 Labs  Recent Labs    03/25/23 1725  CRP 1.3*    Lab Results  Component Value Date   SARSCOV2NAA NEGATIVE 02/12/2023   SARSCOV2NAA NEGATIVE 12/23/2021   SARSCOV2NAA NEGATIVE 11/22/2019   SARSCOV2NAA NEGATIVE 06/10/2019    CBC: Recent Labs  Lab 03/25/23 1145  WBC 9.9  NEUTROABS 6.5  HGB 10.8*  HCT 35.8*  MCV 100.0  PLT 395   Cardiac Enzymes: No results for input(s): "CKTOTAL", "CKMB", "CKMBINDEX", "TROPONINI" in the last 168 hours. BNP (last 3 results) No results for input(s): "PROBNP" in the  last 8760 hours. CBG: Recent Labs  Lab 03/27/23 0814 03/27/23 1159 03/27/23 1705 03/27/23 2155 03/28/23 0822  GLUCAP 105* 176* 209* 194* 153*   D-Dimer: No results for input(s): "DDIMER" in the last 72 hours. Hgb A1c: No results for input(s): "HGBA1C" in the last 72 hours. Lipid Profile: No results for input(s): "CHOL", "HDL", "LDLCALC", "TRIG", "CHOLHDL", "LDLDIRECT" in the last 72 hours. Thyroid function studies: No results for input(s): "TSH", "T4TOTAL", "T3FREE", "THYROIDAB" in the last 72 hours.  Invalid input(s): "FREET3" Anemia work up: No results for input(s): "VITAMINB12", "FOLATE", "FERRITIN", "TIBC", "IRON", "RETICCTPCT" in the last 72 hours. Sepsis Labs: Recent Labs  Lab 03/25/23 1145 03/25/23 1303  WBC 9.9  --   LATICACIDVEN 1.8 1.1   Microbiology Recent Results (from the past 240 hours)  Culture, blood (Routine X 2) w Reflex to ID Panel     Status: None (Preliminary result)   Collection Time: 03/25/23  6:58 PM  Specimen: BLOOD  Result Value Ref Range Status   Specimen Description BLOOD RIGHT ANTECUBITAL  Final   Special Requests   Final    BOTTLES DRAWN AEROBIC AND ANAEROBIC Blood Culture adequate volume   Culture   Final    NO GROWTH 3 DAYS Performed at [facility], [address], [place], [place] [address]    Report Status PENDING  Incomplete  Culture, blood (Routine X 2) w Reflex to ID Panel     Status: None (Preliminary result)   Collection Time: 03/25/23  7:03 PM   Specimen: BLOOD  Result Value Ref Range Status   Specimen Description BLOOD LEFT ANTECUBITAL  Final   Special Requests   Final    BOTTLES DRAWN AEROBIC AND ANAEROBIC Blood Culture adequate volume   Culture   Final    NO GROWTH 3 DAYS Performed at [facility], [address], [place], [place] [address]    Report Status PENDING  Incomplete     Medications:    amLODipine  5 mg Oral Daily   ascorbic acid  500 mg Oral Daily   atorvastatin  40 mg Oral Daily   benztropine  1 mg  Oral BID   divalproex  1,000 mg Oral QHS   divalproex  500 mg Oral Daily   gabapentin  100 mg Oral QHS   heparin  5,000 Units Subcutaneous Q8H   insulin aspart  0-5 Units Subcutaneous QHS   insulin aspart  0-9 Units Subcutaneous TID WC   insulin aspart  4 Units Subcutaneous TID WC   insulin glargine-yfgn  35 Units Subcutaneous QHS   lithium carbonate  300 mg Oral BID WC   loratadine  10 mg Oral Daily   metoprolol tartrate  12.5 mg Oral BID   multivitamin with minerals  1 tablet Oral Daily   polyethylene glycol  17 g Oral BID   risperiDONE  2 mg Oral Daily   tamsulosin  0.4 mg Oral QPC supper   traZODone  50 mg Oral QHS   umeclidinium bromide  1 puff Inhalation Daily   Continuous Infusions:  vancomycin Stopped (03/27/23 1926)      LOS: 3 days   Marinda Elk  [facility]  03/28/2023, 10:38 AM

## 2023-03-28 NOTE — Plan of Care (Signed)

## 2023-03-28 NOTE — Progress Notes (Signed)
RN assumed care of patient at 1500. Agree with current assessment and will continue to monitor.

## 2023-03-29 ENCOUNTER — Inpatient Hospital Stay (HOSPITAL_COMMUNITY): Payer: Medicare Other | Admitting: Anesthesiology

## 2023-03-29 ENCOUNTER — Encounter (HOSPITAL_COMMUNITY): Payer: Self-pay | Admitting: Internal Medicine

## 2023-03-29 ENCOUNTER — Encounter (HOSPITAL_COMMUNITY)
Admission: EM | Disposition: A | Discharge status: Home Health | Payer: Self-pay | Source: Home / Self Care | Attending: Internal Medicine

## 2023-03-29 ENCOUNTER — Other Ambulatory Visit: Payer: Self-pay

## 2023-03-29 DIAGNOSIS — E1169 Type 2 diabetes mellitus with other specified complication: Secondary | ICD-10-CM

## 2023-03-29 DIAGNOSIS — I10 Essential (primary) hypertension: Secondary | ICD-10-CM

## 2023-03-29 DIAGNOSIS — M86172 Other acute osteomyelitis, left ankle and foot: Secondary | ICD-10-CM | POA: Diagnosis not present

## 2023-03-29 DIAGNOSIS — J449 Chronic obstructive pulmonary disease, unspecified: Secondary | ICD-10-CM

## 2023-03-29 HISTORY — PX: AMPUTATION: SHX166

## 2023-03-29 LAB — GLUCOSE, CAPILLARY
Glucose-Capillary: 127 mg/dL — ABNORMAL HIGH (ref 70–99)
Glucose-Capillary: 117 mg/dL — ABNORMAL HIGH (ref 70–99)
Glucose-Capillary: 109 mg/dL — ABNORMAL HIGH (ref 70–99)
Glucose-Capillary: 132 mg/dL — ABNORMAL HIGH (ref 70–99)
Glucose-Capillary: 142 mg/dL — ABNORMAL HIGH (ref 70–99)
Glucose-Capillary: 225 mg/dL — ABNORMAL HIGH (ref 70–99)

## 2023-03-29 LAB — SURGICAL PCR SCREEN
MRSA, PCR: NEGATIVE
Staphylococcus aureus: POSITIVE — AB

## 2023-03-29 SURGERY — AMPUTATION, FOOT, RAY
Anesthesia: Monitor Anesthesia Care | Laterality: Left

## 2023-03-29 MED ORDER — MAGNESIUM SULFATE 2 GM/50ML IV SOLN
2.0000 g | Freq: Every day | INTRAVENOUS | Status: DC | PRN
Start: 2023-03-29 — End: 2023-04-02

## 2023-03-29 MED ORDER — OXYCODONE HCL 5 MG PO TABS
5.0000 mg | ORAL_TABLET | ORAL | Status: DC | PRN
  Administered 2023-03-30 (×2): 10 mg via ORAL
  Filled 2023-03-29 (×3): qty 2

## 2023-03-29 MED ORDER — FENTANYL CITRATE (PF) 100 MCG/2ML IJ SOLN
INTRAMUSCULAR | Status: DC | PRN
  Administered 2023-03-29: 50 ug via INTRAVENOUS

## 2023-03-29 MED ORDER — ORAL CARE MOUTH RINSE
15.0000 mL | Freq: Once | OROMUCOSAL | Status: AC
Start: 2023-03-29 — End: 2023-03-29

## 2023-03-29 MED ORDER — HYDROMORPHONE HCL 1 MG/ML IJ SOLN: 0.5000 mg | INTRAMUSCULAR | Status: DC | PRN

## 2023-03-29 MED ORDER — ONDANSETRON HCL 4 MG/2ML IJ SOLN
INTRAMUSCULAR | Status: DC | PRN
  Administered 2023-03-29: 4 mg via INTRAVENOUS

## 2023-03-29 MED ORDER — LIDOCAINE HCL (PF) 1 % IJ SOLN
INTRAMUSCULAR | Status: AC
Start: 2023-03-29 — End: ?
  Filled 2023-03-29: qty 30

## 2023-03-29 MED ORDER — VITAMIN C 500 MG PO TABS
1000.0000 mg | ORAL_TABLET | Freq: Every day | ORAL | Status: DC
  Administered 2023-03-29 – 2023-04-02 (×5): 1000 mg via ORAL
  Filled 2023-03-29 (×5): qty 2

## 2023-03-29 MED ORDER — PROPOFOL 10 MG/ML IV BOLUS
INTRAVENOUS | Status: AC
  Filled 2023-03-29: qty 20

## 2023-03-29 MED ORDER — JUVEN PO PACK
1.0000 | PACK | Freq: Two times a day (BID) | ORAL | Status: DC
  Administered 2023-03-30 – 2023-04-02 (×7): 1 via ORAL
  Filled 2023-03-29 (×7): qty 1

## 2023-03-29 MED ORDER — MUPIROCIN 2 % EX OINT
1.0000 | TOPICAL_OINTMENT | Freq: Two times a day (BID) | CUTANEOUS | Status: DC
  Administered 2023-03-29 – 2023-04-02 (×9): 1 via NASAL
  Filled 2023-03-29 (×2): qty 22

## 2023-03-29 MED ORDER — SODIUM CHLORIDE 0.9 % IV SOLN: INTRAVENOUS | Status: DC

## 2023-03-29 MED ORDER — LIDOCAINE HCL (PF) 1 % IJ SOLN
INTRAMUSCULAR | Status: DC | PRN
  Administered 2023-03-29: 20 mL

## 2023-03-29 MED ORDER — ACETAMINOPHEN 325 MG PO TABS
325.0000 mg | ORAL_TABLET | Freq: Four times a day (QID) | ORAL | Status: DC | PRN
Start: 2023-03-30 — End: 2023-04-02
  Administered 2023-03-30: 650 mg via ORAL
  Filled 2023-03-29: qty 2

## 2023-03-29 MED ORDER — CHLORHEXIDINE GLUCONATE 0.12 % MT SOLN
OROMUCOSAL | Status: AC
  Administered 2023-03-29: 15 mL via OROMUCOSAL
  Filled 2023-03-29: qty 15

## 2023-03-29 MED ORDER — FENTANYL CITRATE (PF) 250 MCG/5ML IJ SOLN
INTRAMUSCULAR | Status: AC
  Filled 2023-03-29: qty 5

## 2023-03-29 MED ORDER — CHLORHEXIDINE GLUCONATE CLOTH 2 % EX PADS
6.0000 | MEDICATED_PAD | Freq: Every day | CUTANEOUS | Status: DC
  Administered 2023-03-29 – 2023-04-02 (×4): 6 via TOPICAL

## 2023-03-29 MED ORDER — FENTANYL CITRATE (PF) 100 MCG/2ML IJ SOLN: 25.0000 ug | INTRAMUSCULAR | Status: DC | PRN

## 2023-03-29 MED ORDER — ZINC SULFATE 220 (50 ZN) MG PO CAPS
220.0000 mg | ORAL_CAPSULE | Freq: Every day | ORAL | Status: DC
  Administered 2023-03-29 – 2023-04-02 (×5): 220 mg via ORAL
  Filled 2023-03-29 (×5): qty 1

## 2023-03-29 MED ORDER — POTASSIUM CHLORIDE CRYS ER 20 MEQ PO TBCR: 20.0000 meq | EXTENDED_RELEASE_TABLET | Freq: Every day | ORAL | Status: DC | PRN

## 2023-03-29 MED ORDER — CHLORHEXIDINE GLUCONATE 0.12 % MT SOLN: 15.0000 mL | Freq: Once | OROMUCOSAL | Status: AC

## 2023-03-29 MED ORDER — INSULIN ASPART 100 UNIT/ML IJ SOLN: 0.0000 [IU] | INTRAMUSCULAR | Status: DC | PRN

## 2023-03-29 MED ORDER — CEFAZOLIN SODIUM-DEXTROSE 2-4 GM/100ML-% IV SOLN
2.0000 g | Freq: Three times a day (TID) | INTRAVENOUS | Status: AC
  Administered 2023-03-29 – 2023-03-30 (×2): 2 g via INTRAVENOUS
  Filled 2023-03-29 (×2): qty 100

## 2023-03-29 MED ORDER — PHENYLEPHRINE HCL-NACL 20-0.9 MG/250ML-% IV SOLN
INTRAVENOUS | Status: DC | PRN
  Administered 2023-03-29: 25 ug/min via INTRAVENOUS

## 2023-03-29 MED ORDER — LIDOCAINE 2% (20 MG/ML) 5 ML SYRINGE
INTRAMUSCULAR | Status: DC | PRN
  Administered 2023-03-29: 20 mg via INTRAVENOUS

## 2023-03-29 MED ORDER — PROPOFOL 10 MG/ML IV BOLUS
INTRAVENOUS | Status: DC | PRN
  Administered 2023-03-29: 30 mg via INTRAVENOUS

## 2023-03-29 MED ORDER — 0.9 % SODIUM CHLORIDE (POUR BTL) OPTIME
TOPICAL | Status: DC | PRN
  Administered 2023-03-29: 1000 mL

## 2023-03-29 MED ORDER — PROPOFOL 500 MG/50ML IV EMUL
INTRAVENOUS | Status: DC | PRN
  Administered 2023-03-29: 75 ug/kg/min via INTRAVENOUS

## 2023-03-29 MED ORDER — VASHE WOUND IRRIGATION OPTIME
TOPICAL | Status: DC | PRN
  Administered 2023-03-29: 34 [oz_av]

## 2023-03-29 MED ORDER — OXYCODONE HCL 5 MG PO TABS
10.0000 mg | ORAL_TABLET | ORAL | Status: DC | PRN
  Administered 2023-03-29 (×2): 10 mg via ORAL
  Administered 2023-03-30 – 2023-04-02 (×3): 15 mg via ORAL
  Filled 2023-03-29 (×4): qty 3

## 2023-03-29 MED ORDER — CEFAZOLIN SODIUM-DEXTROSE 2-4 GM/100ML-% IV SOLN
INTRAVENOUS | Status: AC
  Filled 2023-03-29: qty 100

## 2023-03-29 SURGICAL SUPPLY — 29 items
BAG COUNTER SPONGE SURGICOUNT (BAG) ×1 IMPLANT
BLADE SAW SGTL MED 73X18.5 STR (BLADE) IMPLANT
BLADE SURG 21 STRL SS (BLADE) ×1 IMPLANT
BNDG COHESIVE 6X5 TAN NS LF (GAUZE/BANDAGES/DRESSINGS) IMPLANT
BNDG GAUZE DERMACEA FLUFF 4 (GAUZE/BANDAGES/DRESSINGS) ×1 IMPLANT
CLEANSER WND VASHE INSTL 34OZ (WOUND CARE) IMPLANT
COVER SURGICAL LIGHT HANDLE (MISCELLANEOUS) ×2 IMPLANT
DRAPE U-SHAPE 47X51 STRL (DRAPES) ×2 IMPLANT
DRSG ADAPTIC 3X8 NADH LF (GAUZE/BANDAGES/DRESSINGS) ×1 IMPLANT
DRSG EMULSION OIL 3X3 NADH (GAUZE/BANDAGES/DRESSINGS) IMPLANT
DURAPREP 26ML APPLICATOR (WOUND CARE) ×1 IMPLANT
ELECT REM PT RETURN 9FT ADLT (ELECTROSURGICAL) ×1 IMPLANT
ELECTRODE REM PT RTRN 9FT ADLT (ELECTROSURGICAL) ×1 IMPLANT
GAUZE PAD ABD 8X10 STRL (GAUZE/BANDAGES/DRESSINGS) ×2 IMPLANT
GAUZE SPONGE 4X4 12PLY STRL LF (GAUZE/BANDAGES/DRESSINGS) IMPLANT
GLOVE BIOGEL PI IND STRL 9 (GLOVE) ×1 IMPLANT
GLOVE SURG ORTHO 9.0 STRL STRW (GLOVE) ×1 IMPLANT
GOWN STRL REUS W/ TWL XL LVL3 (GOWN DISPOSABLE) ×2 IMPLANT
GRAFT SKIN WND MICRO 38 (Tissue) IMPLANT
KIT BASIN OR (CUSTOM PROCEDURE TRAY) ×1 IMPLANT
KIT TURNOVER KIT B (KITS) ×1 IMPLANT
NS IRRIG 1000ML POUR BTL (IV SOLUTION) ×1 IMPLANT
PACK ORTHO EXTREMITY (CUSTOM PROCEDURE TRAY) ×1 IMPLANT
PAD ARMBOARD 7.5X6 YLW CONV (MISCELLANEOUS) ×2 IMPLANT
STOCKINETTE IMPERVIOUS LG (DRAPES) IMPLANT
SUT ETHILON 2 0 PSLX (SUTURE) ×1 IMPLANT
TOWEL GREEN STERILE (TOWEL DISPOSABLE) ×1 IMPLANT
TUBE CONNECTING 12X1/4 (SUCTIONS) ×1 IMPLANT
YANKAUER SUCT BULB TIP NO VENT (SUCTIONS) ×1 IMPLANT

## 2023-03-29 NOTE — Interval H&P Note (Signed)
History and Physical Interval Note:  03/29/2023 6:53 AM  Scott [name]  has presented today for surgery, with the diagnosis of Osteomyelitis Left Foot.  The various methods of treatment have been discussed with the patient and family. After consideration of risks, benefits and other options for treatment, the patient has consented to  Procedure(s): LEFT 1ST RAY AMPUTATION (Left) as a surgical intervention.  The patient's history has been reviewed, patient examined, no change in status, stable for surgery.  I have reviewed the patient's chart and labs.  Questions were answered to the patient's satisfaction.     [name]

## 2023-03-29 NOTE — Op Note (Signed)
03/29/2023  3:04 PM  PATIENT:  Scott [name]    PRE-OPERATIVE DIAGNOSIS:  Osteomyelitis Left Foot  POST-OPERATIVE DIAGNOSIS:  Same  PROCEDURE:  LEFT 1ST RAY AMPUTATION Local tissue rearrangement for wound closure 10 x 3 cm. Application of Kerecis micro graft 38 cm. Irrigation wound Vashe  SURGEON:  [name], MD  PHYSICIAN ASSISTANT:None ANESTHESIA:   General  PREOPERATIVE INDICATIONS:  Scott [name] is a  62 y.o. male with a diagnosis of Osteomyelitis Left Foot who failed conservative measures and elected for surgical management.    The risks benefits and alternatives were discussed with the patient preoperatively including but not limited to the risks of infection, bleeding, nerve injury, cardiopulmonary complications, the need for revision surgery, among others, and the patient was willing to proceed.  OPERATIVE IMPLANTS:   Implant Name Type Inv. Item Serial No. Manufacturer Lot No. LRB No. Used Action  GRAFT SKIN WND MICRO 38 - [id number] Tissue GRAFT SKIN WND MICRO 38  [employer] [id number] Left 1 Implanted    @ENCIMAGES @  OPERATIVE FINDINGS: Tissue margins were clear.  Patient had good petechial bleeding.  OPERATIVE PROCEDURE: Patient was brought the operating room and underwent a MAC anesthetic.  The left lower extremity was prepped using DuraPrep draped into a sterile field a timeout was called.  Patient underwent local infiltration with 20 cc 1% lidocaine plain.  An elliptical incision was made around the ulcerative tissue in the first ray.  This left a wound that was 10 x 3 cm.  Electrocautery was used hemostasis the toe and ray were resected in 1 block of tissue.  Tissue margins were healthy and viable.  Tissue margins were undermined to allow for local tissue rearrangement.  After hemostasis the wound was irrigated with Vashe irrigation.  The wound was then filled with 38 cm of Kerecis micro graft to cover a wound surface area of 40 cm.  Local tissue  rearrangement was used to close the wound 3 x 10 cm.  Wound was covered with a dry dressing patient was taken the PACU in stable condition.   DISCHARGE PLANNING:  Antibiotic duration: Continue antibiotics for 24 hours  Weightbearing: Touchdown weightbearing on the left with postoperative shoe  Pain medication: Opioid pathway  Dressing care/ Wound VAC: Dry dressing reinforce as needed  Ambulatory devices: Walker  Discharge to: Discharge planning based on therapy recommendations.  Follow-up: In the office 1 week post operative.

## 2023-03-29 NOTE — Transfer of Care (Signed)
Immediate Anesthesia Transfer of Care Note  Patient: Scott [name]  Procedure(s) Performed: LEFT 1ST RAY AMPUTATION (Left)  Patient Location: PACU  Anesthesia Type:MAC  Level of Consciousness: awake and alert   Airway & Oxygen Therapy: Patient Spontanous Breathing  Post-op Assessment: Report given to RN  Post vital signs: Reviewed and stable  Last Vitals:  Vitals Value Taken Time  BP 105/66 03/29/23 1508  Temp    Pulse 78 03/29/23 1511  Resp 13 03/29/23 1511  SpO2 96 % 03/29/23 1511  Vitals shown include unfiled device data.  Last Pain:  Vitals:   03/29/23 1320  TempSrc: Oral  PainSc: 0-No pain         Complications: No notable events documented.

## 2023-03-29 NOTE — Progress Notes (Signed)
[facility] PROGRESS NOTE    Progress Note  Scott [name]  EAV:[id number] DOB: 14-Dec-1960 DOA: 03/25/2023 PCP: Benetta Spar, MD     Brief Narrative:   Scott [name] is an 62 y.o. male past medical history significant for diabetes mellitus type 2, hyperlipidemia bipolar disorder comes in from the podiatrist office to [facility] for for secondary diabetic left foot ulcer MRI showed osteomyelitis orthopedic surgery was consulted who recommended transfer to [facility]  Assessment/Plan:   Acute osteomyelitis of metatarsal bone of left foot (HCC) MRI of the foot showed osteomyelitis of the medial hallux with ulceration and cellulitis. Orthopedic surgery was consulted recommended left foot first ray amputation on 03/29/2023. Continue Tylenol for pain, currently on a bowel regimen.  Diabetes mellitus type 2 with peripheral neuropathy: A1c of 7.3, blood glucose improved continue long-acting insulin plus sliding scale.  Essential hypertension: Pressures relatively well-controlled. Continue metoprolol.  History of BPH: Continue Flomax.  Hyperlipidemia: Continue statins.  Bipolar 1 disorder, mixed (HCC) Continue risperidone, lithium, Depakote and benztropine  Tobacco use disorder Counseling.   DVT prophylaxis: lovenox Family Communication:none Status is: Inpatient Remains inpatient appropriate because: Acute osteomyelitis    Code Status:     Code Status Orders  (From admission, onward)           Start     Ordered   03/25/23 1529  Full code  Continuous       Question:  By:  Answer:  Consent: discussion documented in EHR   03/25/23 1528           Code Status History     Date Active Date Inactive Code Status Order ID Comments User Context   02/12/2023 0404 02/15/2023 2201 Full Code [id number]  Frankey Shown, DO ED   11/22/2019 0508 11/24/2019 1957 Full Code [id number]  Frankey Shown, DO ED   06/11/2019 0925 06/15/2019 1744 Full  Code [id number]  Kendell Bane, MD Inpatient   03/09/2019 0625 03/13/2019 1941 Full Code [id number]  Bobette Mo, MD ED   03/09/2019 0411 03/09/2019 0625 Full Code [id number]  Bobette Mo, MD ED   04/27/2015 2025 05/04/2015 1433 Full Code [id number]  Audery Amel, MD Inpatient   04/05/2015 1816 04/09/2015 1732 Full Code [id number]  Houston Siren, MD Inpatient      Advance Directive Documentation    Flowsheet Row Most Recent Value  Type of Advance Directive Healthcare Power of Attorney  Pre-existing out of facility DNR order (yellow form or pink MOST form) --  "MOST" Form in Place? --         IV Access:   Peripheral IV   Procedures and diagnostic studies:   No results found.    Medical Consultants:   None.   Subjective:    Jule Destefano Stillman no complains  Objective:    Vitals:   03/28/23 2013 03/29/23 0500 03/29/23 0742 03/29/23 0901  BP: (!) 145/85 (!) 126/92 138/87   Pulse: 77 69 77 75  Resp: 17 19 16 17   Temp: 97.8 F (36.6 C) 97.8 F (36.6 C) 97.9 F (36.6 C)   TempSrc: Oral Oral Oral   SpO2: 96% 96% 99% 94%  Weight:      Height:       SpO2: 94 % O2 Flow Rate (L/min): 2 L/min FiO2 (%): 28 %   Intake/Output Summary (Last 24 hours) at 03/29/2023 0924 Last data filed at 03/29/2023 0553 Gross per 24 hour  Intake 2380 ml  Output 3425  ml  Net -1045 ml   Filed Weights   03/25/23 1103 03/25/23 1628 03/27/23 0100  Weight: 100.2 kg 95.4 kg 95.1 kg    Exam: General exam: In no acute distress. Respiratory system: Good air movement and clear to auscultation. Cardiovascular system: S1 & S2 heard, RRR. No JVD. Gastrointestinal system: Abdomen is nondistended, soft and nontender.  Extremities: No pedal edema. Skin: No rashes, lesions or ulcers Psychiatry: Judgement and insight appear normal. Mood & affect appropriate.  Data Reviewed:    Labs: Basic Metabolic Panel: Recent Labs  Lab 03/25/23 1145 03/26/23 0419 03/28/23 0635  NA  139 142 139  K 4.1 4.8 4.6  CL 103 109 110  CO2 25 27 23   GLUCOSE 132* 105* 170*  BUN 25* 21 23  CREATININE 1.83* 1.62* 1.64*  CALCIUM 8.8* 8.7* 8.7*   GFR Estimated Creatinine Clearance: 55 mL/min (A) (by C-G formula based on SCr of 1.64 mg/dL (H)). Liver Function Tests: Recent Labs  Lab 03/25/23 1145  AST 14*  ALT 12  ALKPHOS 52  BILITOT 0.4  PROT 7.5  ALBUMIN 2.6*   No results for input(s): "LIPASE", "AMYLASE" in the last 168 hours. No results for input(s): "AMMONIA" in the last 168 hours. Coagulation profile No results for input(s): "INR", "PROTIME" in the last 168 hours. COVID-19 Labs  No results for input(s): "DDIMER", "FERRITIN", "LDH", "CRP" in the last 72 hours.   Lab Results  Component Value Date   SARSCOV2NAA NEGATIVE 02/12/2023   SARSCOV2NAA NEGATIVE 12/23/2021   SARSCOV2NAA NEGATIVE 11/22/2019   SARSCOV2NAA NEGATIVE 06/10/2019    CBC: Recent Labs  Lab 03/25/23 1145  WBC 9.9  NEUTROABS 6.5  HGB 10.8*  HCT 35.8*  MCV 100.0  PLT 395   Cardiac Enzymes: No results for input(s): "CKTOTAL", "CKMB", "CKMBINDEX", "TROPONINI" in the last 168 hours. BNP (last 3 results) No results for input(s): "PROBNP" in the last 8760 hours. CBG: Recent Labs  Lab 03/28/23 0822 03/28/23 1206 03/28/23 1725 03/28/23 2134 03/29/23 0741  GLUCAP 153* 192* 162* 197* 127*   D-Dimer: No results for input(s): "DDIMER" in the last 72 hours. Hgb A1c: No results for input(s): "HGBA1C" in the last 72 hours. Lipid Profile: No results for input(s): "CHOL", "HDL", "LDLCALC", "TRIG", "CHOLHDL", "LDLDIRECT" in the last 72 hours. Thyroid function studies: No results for input(s): "TSH", "T4TOTAL", "T3FREE", "THYROIDAB" in the last 72 hours.  Invalid input(s): "FREET3" Anemia work up: No results for input(s): "VITAMINB12", "FOLATE", "FERRITIN", "TIBC", "IRON", "RETICCTPCT" in the last 72 hours. Sepsis Labs: Recent Labs  Lab 03/25/23 1145 03/25/23 1303  WBC 9.9  --    LATICACIDVEN 1.8 1.1   Microbiology Recent Results (from the past 240 hours)  Culture, blood (Routine X 2) w Reflex to ID Panel     Status: None (Preliminary result)   Collection Time: 03/25/23  6:58 PM   Specimen: BLOOD  Result Value Ref Range Status   Specimen Description BLOOD RIGHT ANTECUBITAL  Final   Special Requests   Final    BOTTLES DRAWN AEROBIC AND ANAEROBIC Blood Culture adequate volume   Culture   Final    NO GROWTH 4 DAYS Performed at [facility], [address] [place], [place] [address]    Report Status PENDING  Incomplete  Culture, blood (Routine X 2) w Reflex to ID Panel     Status: None (Preliminary result)   Collection Time: 03/25/23  7:03 PM   Specimen: BLOOD  Result Value Ref Range Status   Specimen Description BLOOD  LEFT ANTECUBITAL  Final   Special Requests   Final    BOTTLES DRAWN AEROBIC AND ANAEROBIC Blood Culture adequate volume   Culture   Final    NO GROWTH 4 DAYS Performed at [facility], [address], [place], [place] [address]    Report Status PENDING  Incomplete  Surgical pcr screen     Status: Abnormal   Collection Time: 03/28/23 10:08 PM   Specimen: Nasal Mucosa; Nasal Swab  Result Value Ref Range Status   MRSA, PCR NEGATIVE NEGATIVE Final   Staphylococcus aureus POSITIVE (A) NEGATIVE Final    Comment: (NOTE) The Xpert SA Assay (FDA approved for NASAL specimens in patients 97 years of age and older), is one component of a comprehensive surveillance program. It is not intended to diagnose infection nor to guide or monitor treatment. Performed at [facility], [address] [place], [place] [address]      Medications:    amLODipine  5 mg Oral Daily   ascorbic acid  500 mg Oral Daily   atorvastatin  40 mg Oral Daily   benztropine  1 mg Oral BID   Chlorhexidine Gluconate Cloth  6 each Topical Q0600   divalproex  1,000 mg Oral QHS   divalproex  500 mg Oral Daily   gabapentin  100 mg Oral QHS   heparin  5,000 Units  Subcutaneous Q8H   insulin aspart  0-5 Units Subcutaneous QHS   insulin aspart  0-9 Units Subcutaneous TID WC   insulin aspart  4 Units Subcutaneous TID WC   insulin glargine-yfgn  35 Units Subcutaneous QHS   lithium carbonate  300 mg Oral BID WC   loratadine  10 mg Oral Daily   metoprolol tartrate  12.5 mg Oral BID   multivitamin with minerals  1 tablet Oral Daily   mupirocin ointment  1 Application Nasal BID   povidone-iodine  2 Application Topical Once   risperiDONE  2 mg Oral Daily   tamsulosin  0.4 mg Oral QPC supper   traZODone  50 mg Oral QHS   umeclidinium bromide  1 puff Inhalation Daily   Continuous Infusions:   ceFAZolin (ANCEF) IV     vancomycin 1,750 mg (03/28/23 1605)      LOS: 4 days   Marinda Elk  [facility]  03/29/2023, 9:24 AM

## 2023-03-29 NOTE — TOC Progression Note (Signed)
Transition of Care [facility]) - Progression Note    Patient Details  Name: CHRISTOFHER EASTMAN MRN: [id number] Date of Birth: 02/11/1961  [facility] CM/SW Contact  Carley Hammed, LCSW Phone Number: 03/29/2023, 11:33 AM  Clinical Narrative:    CSW advised that pt is currently active with [facility]. Pt to have procedure today. TOC will continue to follow.    Expected Discharge Plan: Assisted Living Barriers to Discharge: Continued Medical Work up  Expected Discharge Plan and Services In-house Referral: Clinical Social Work   Post Acute Care Choice: Resumption of Svcs/PTA Provider (ALF) Living arrangements for the past 2 months: Assisted Living Facility                                       Social Determinants of Health (SDOH) Interventions SDOH Screenings   Food Insecurity: No Food Insecurity (03/25/2023)  Housing: Low Risk  (03/25/2023)  Transportation Needs: No Transportation Needs (03/25/2023)  Utilities: Not At Risk (03/25/2023)  Tobacco Use: Low Risk  (03/25/2023)    Readmission Risk Interventions    03/26/2023   12:38 PM 02/12/2023   10:56 AM  Readmission Risk Prevention Plan  Transportation Screening Complete Complete  PCP or Specialist Appt within 3-5 Days Not Complete   HRI or Home Care Consult Complete Complete  Social Work Consult for Recovery Care Planning/Counseling Complete Complete  Palliative Care Screening Not Applicable Not Applicable  Medication Review Ocean[employer]) Complete Complete

## 2023-03-29 NOTE — Consult Note (Signed)
Pharmacy Antibiotic Note  ASSESSMENT: 62 y.o. male with PMH including diabetes and diabetic foot ulcer is presenting with  worsening diabetic ulcer on left foot . Reportedly has had the wound for three months. There is concern for osteomyelitis given new evidence on foot XR. Pharmacy has been consulted to manage vancomycin dosing.  Renal function relatively stable, afebrile, WBC WNL.  Plan: Continue vanc 1750mg  IV Q24H for AUC 505 Monitor renal fxn, clinical progress, vanc levels as indicated F/u post amputation today   Patient measurements: Height: 6\' 2"  (188 cm) Weight: 95.1 kg (209 lb 10.5 oz) IBW/kg (Calculated) : 82.2  Vital signs: Temp: 97.9 F (36.6 C) (12/20 0742) Temp Source: Oral (12/20 0742) BP: 138/87 (12/20 0742) Pulse Rate: 77 (12/20 0742) Recent Labs  Lab 03/25/23 1145 03/26/23 0419 03/28/23 0635  WBC 9.9  --   --   CREATININE 1.83* 1.62* 1.64*   Estimated Creatinine Clearance: 55 mL/min (A) (by C-G formula based on SCr of 1.64 mg/dL (H)).  Allergies: No Known Allergies  Vanc 12/16 >> Cefepime 12/1x1   12/16 BCx - NGTD  Scott [name] D. Laney Potash, PharmD, BCPS, BCCCP 03/29/2023, 7:55 AM

## 2023-03-29 NOTE — Progress Notes (Signed)
CCC Pre-op Review  Pre-op checklist: Partially completed  NPO: Ordered  Labs: PCR Neg  Consent: Completed per Epic  H&P: interval done by Lajoyce Corners today  Vitals: BP elevated  O2 requirements: 2L [place]ronila  MAR/PTA review: BB due at 10, heparin given at 0632, No GLPs,   IV: 22g  Floor nurse name:  [name], RN  Additional info:   Pt has MR and has legal guardian: Scott [name] [phone]

## 2023-03-29 NOTE — Plan of Care (Signed)
  Problem: Clinical Measurements: Goal: Will remain free from infection Outcome: Progressing   Problem: Clinical Measurements: Goal: Diagnostic test results will improve Outcome: Progressing   Problem: Clinical Measurements: Goal: Respiratory complications will improve Outcome: Progressing   Problem: Coping: Goal: Level of anxiety will decrease Outcome: Progressing   Problem: Nutrition: Goal: Adequate nutrition will be maintained Outcome: Progressing   Problem: Pain Management: Goal: General experience of comfort will improve Outcome: Progressing   Problem: Nutritional: Goal: Maintenance of adequate nutrition will improve Outcome: Progressing   Problem: Tissue Perfusion: Goal: Adequacy of tissue perfusion will improve Outcome: Progressing

## 2023-03-29 NOTE — Anesthesia Preprocedure Evaluation (Addendum)
Anesthesia Evaluation  Patient identified by MRN, date of birth, ID band Patient awake    Reviewed: Allergy & Precautions, NPO status , Patient's Chart, lab work & pertinent test results, reviewed documented beta blocker date and time   Airway Mallampati: III  TM Distance: >3 FB Neck ROM: Full    Dental  (+) Missing, Chipped   Pulmonary COPD (2L O2),  oxygen dependent   Pulmonary exam normal breath sounds clear to auscultation       Cardiovascular hypertension, Pt. on medications and Pt. on home beta blockers Normal cardiovascular exam Rhythm:Regular Rate:Normal  TTE 2021  1. Left ventricular ejection fraction, by estimation, is 60 to 65%. The  left ventricle has normal function. The left ventricle has no regional  wall motion abnormalities. There is mild left ventricular hypertrophy.  Left ventricular diastolic parameters  are consistent with Grade I diastolic dysfunction (impaired relaxation).   2. Right ventricular systolic function is normal. The right ventricular  size is normal.   3. The mitral valve is normal in structure and function. Trivial mitral  valve regurgitation. No evidence of mitral stenosis.   4. The aortic valve is tricuspid. Aortic valve regurgitation is not  visualized. No aortic stenosis is present.     Neuro/Psych  PSYCHIATRIC DISORDERS  Depression Bipolar Disorder   negative neurological ROS     GI/Hepatic negative GI ROS, Neg liver ROS,,,  Endo/Other  diabetes, Type 2, Oral Hypoglycemic Agents, Insulin Dependent    Renal/GU negative Renal ROS  negative genitourinary   Musculoskeletal  (+) Arthritis ,    Abdominal   Peds Developmental delay   Hematology negative hematology ROS (+)   Anesthesia Other Findings   Reproductive/Obstetrics                             Anesthesia Physical Anesthesia Plan  ASA: 3  Anesthesia Plan: MAC   Post-op Pain Management:     Induction: Intravenous  PONV Risk Score and Plan: 1 and Propofol infusion, Treatment may vary due to age or medical condition, Midazolam and Ondansetron  Airway Management Planned: Natural Airway  Additional Equipment:   Intra-op Plan:   Post-operative Plan:   Informed Consent: I have reviewed the patients History and Physical, chart, labs and discussed the procedure including the risks, benefits and alternatives for the proposed anesthesia with the patient or authorized representative who has indicated his/her understanding and acceptance.     Dental advisory given  Plan Discussed with: CRNA  Anesthesia Plan Comments:        Anesthesia Quick Evaluation

## 2023-03-30 DIAGNOSIS — M86172 Other acute osteomyelitis, left ankle and foot: Secondary | ICD-10-CM | POA: Diagnosis not present

## 2023-03-30 LAB — GLUCOSE, CAPILLARY
Glucose-Capillary: 155 mg/dL — ABNORMAL HIGH (ref 70–99)
Glucose-Capillary: 165 mg/dL — ABNORMAL HIGH (ref 70–99)
Glucose-Capillary: 177 mg/dL — ABNORMAL HIGH (ref 70–99)
Glucose-Capillary: 215 mg/dL — ABNORMAL HIGH (ref 70–99)

## 2023-03-30 LAB — CULTURE, BLOOD (ROUTINE X 2)
Culture: NO GROWTH
Culture: NO GROWTH
Special Requests: ADEQUATE
Special Requests: ADEQUATE

## 2023-03-30 MED ORDER — POLYETHYLENE GLYCOL 3350 17 G PO PACK
17.0000 g | PACK | Freq: Two times a day (BID) | ORAL | Status: AC
  Administered 2023-03-30 – 2023-03-31 (×3): 17 g via ORAL
  Filled 2023-03-30 (×4): qty 1

## 2023-03-30 NOTE — Evaluation (Signed)
Physical Therapy Evaluation Patient Details Name: Scott [name] MRN: [id number] DOB: September 28, 1960 Today's Date: 03/30/2023  History of Present Illness  Pt is a 62 year old male comes in from the podiatrist office to [facility] for secondary diabetic left foot ulcer MRI showed osteomyelitis orthopedic surgery was consulted who recommended transfer to [facility]. Pt is now s/p L 1st ray amputation on 12/20. Past medical history significant for diabetes mellitus type 2, hyperlipidemia bipolar disorder.  Clinical Impression  Pt presents with admitting diagnosis above. Pt today was able to stand with Min/Mod A however unable to take any steps and noted with very heavy R lateral lean. Pt was then able to lateral scoot CGA along EOB. PTA pt reports he was able to ambulate short distances with RW at ALF. Patient will benefit from continued inpatient follow up therapy, <3 hours/day at current facility. PT will continue to follow.        If plan is discharge home, recommend the following: A lot of help with walking and/or transfers;A little help with bathing/dressing/bathroom;Assistance with cooking/housework;Direct supervision/assist for medications management;Assist for transportation;Help with stairs or ramp for entrance   Can travel by private vehicle   No    Equipment Recommendations Other (comment) (Per accepting facility)  Recommendations for Other Services       Functional Status Assessment Patient has had a recent decline in their functional status and demonstrates the ability to make significant improvements in function in a reasonable and predictable amount of time.     Precautions / Restrictions Precautions Precautions: Fall Restrictions Weight Bearing Restrictions Per Provider Order: Yes LLE Weight Bearing Per Provider Order: Touchdown weight bearing      Mobility  Bed Mobility Overal bed mobility: Needs Assistance Bed Mobility: Supine to Sit, Sit to Supine      Supine to sit: Supervision Sit to supine: Supervision        Transfers Overall transfer level: Needs assistance Equipment used: Rolling walker (2 wheels), None Transfers: Sit to/from Stand, Bed to chair/wheelchair/BSC Sit to Stand: Min assist, Mod assist, From elevated surface          Lateral/Scoot Transfers: Contact guard assist General transfer comment: Mod A from lower surface. Min A elevated bed. Unable to take any steps and noted with very heavy R lateral lean. Pt was then able to lateral scoot CGA along EOB.    Ambulation/Gait               General Gait Details: deferred  Stairs            Wheelchair Mobility     Tilt Bed    Modified Rankin (Stroke Patients Only)       Balance Overall balance assessment: Needs assistance Sitting-balance support: Bilateral upper extremity supported, Feet supported Sitting balance-Leahy Scale: Fair     Standing balance support: Bilateral upper extremity supported, During functional activity Standing balance-Leahy Scale: Poor Standing balance comment: Reliant on RW                             Pertinent Vitals/Pain Pain Assessment Pain Assessment: 0-10 Pain Score: 5  Pain Location: L foot Pain Descriptors / Indicators: Constant, Discomfort, Sore Pain Intervention(s): Monitored during session, Premedicated before session    Home Living Family/patient expects to be discharged to:: Assisted living                 Home Equipment: Agricultural consultant (2 wheels);Shower seat  Prior Function Prior Level of Function : Independent/Modified Independent             Mobility Comments: Pt reporting that from facility and walks @ mod I level with RW around facility ADLs Comments: Reports supervision level for all ADLs at facility.     Extremity/Trunk Assessment   Upper Extremity Assessment Upper Extremity Assessment: Generalized weakness    Lower Extremity Assessment Lower Extremity  Assessment: LLE deficits/detail LLE Deficits / Details: L 1st ray amputation    Cervical / Trunk Assessment Cervical / Trunk Assessment: Normal  Communication   Communication Communication: No apparent difficulties Cueing Techniques: Verbal cues;Tactile cues  Cognition Arousal: Alert Behavior During Therapy: Flat affect Overall Cognitive Status: Within Functional Limits for tasks assessed                                          General Comments General comments (skin integrity, edema, etc.): Pt initially received on 82% with [place] hanging off face however pt recovered to 93% on 2L.    Exercises     Assessment/Plan    PT Assessment Patient needs continued PT services  PT Problem List Decreased strength;Decreased range of motion;Decreased activity tolerance;Decreased balance;Decreased mobility;Decreased coordination;Decreased knowledge of use of DME;Decreased safety awareness;Decreased knowledge of precautions;Cardiopulmonary status limiting activity       PT Treatment Interventions DME instruction;Gait training;Stair training;Functional mobility training;Therapeutic activities;Therapeutic exercise;Balance training;Neuromuscular re-education;Patient/family education    PT Goals (Current goals can be found in the Care Plan section)  Acute Rehab PT Goals Patient Stated Goal: to get better PT Goal Formulation: With patient Time For Goal Achievement: 04/13/23 Potential to Achieve Goals: Fair    Frequency Min 1X/week     Co-evaluation               AM-PAC PT "6 Clicks" Mobility  Outcome Measure Help needed turning from your back to your side while in a flat bed without using bedrails?: A Little Help needed moving from lying on your back to sitting on the side of a flat bed without using bedrails?: A Little Help needed moving to and from a bed to a chair (including a wheelchair)?: A Little Help needed standing up from a chair using your arms (e.g.,  wheelchair or bedside chair)?: A Lot Help needed to walk in hospital room?: Total Help needed climbing 3-5 steps with a railing? : Total 6 Click Score: 13    End of Session Equipment Utilized During Treatment: Gait belt;Oxygen Activity Tolerance: Patient limited by fatigue Patient left: in bed;with call bell/phone within reach;with bed alarm set Nurse Communication: Mobility status PT Visit Diagnosis: Other abnormalities of gait and mobility (R26.89)    Time: 1450-1505 PT Time Calculation (min) (ACUTE ONLY): 15 min   Charges:   PT Evaluation $PT Eval Moderate Complexity: 1 Mod   PT General Charges $$ ACUTE PT VISIT: 1 Visit         Shela Nevin, PT, DPT Acute Rehab Services [phone]   Gladys Damme 03/30/2023, 4:48 PM

## 2023-03-30 NOTE — Anesthesia Postprocedure Evaluation (Signed)
Anesthesia Post Note  Patient: Scott [name]  Procedure(s) Performed: LEFT 1ST RAY AMPUTATION (Left)     Patient location during evaluation: PACU Anesthesia Type: MAC Level of consciousness: awake and alert Pain management: pain level controlled Vital Signs Assessment: post-procedure vital signs reviewed and stable Respiratory status: spontaneous breathing, nonlabored ventilation, respiratory function stable and patient connected to nasal cannula oxygen Cardiovascular status: stable and blood pressure returned to baseline Postop Assessment: no apparent nausea or vomiting Anesthetic complications: no  No notable events documented.  Last Vitals:  Vitals:   03/30/23 0816 03/30/23 0900  BP: 120/74   Pulse: 70   Resp: 17   Temp: 36.8 C   SpO2: 91% 92%    Last Pain:  Vitals:   03/30/23 0816  TempSrc: Oral  PainSc:    Pain Goal:                   Apryll Hinkle L Deena Shaub

## 2023-03-30 NOTE — Progress Notes (Signed)
Patient ID: Scott [name], male   DOB: 09/30/1960, 62 y.o.   MRN: [id number] Patient is postoperative day 1 first ray amputation left foot.  The dressing is clean and dry.  Patient may discharge to home when safe with therapy.  I will change the dressing at follow-up in the office in 1 to 2 weeks.

## 2023-03-30 NOTE — Progress Notes (Signed)
[facility] PROGRESS NOTE    Progress Note  RAAD BAJEMA  WUJ:[id number] DOB: 02/03/1961 DOA: 03/25/2023 PCP: Benetta Spar, MD     Brief Narrative:   Scott [name] is an 62 y.o. male past medical history significant for diabetes mellitus type 2, hyperlipidemia bipolar disorder comes in from the podiatrist office to [facility] for for secondary diabetic left foot ulcer MRI showed osteomyelitis orthopedic surgery was consulted who recommended transfer to [facility]  Assessment/Plan:   Acute osteomyelitis of metatarsal bone of left foot (HCC) MRI of the foot showed osteomyelitis of the medial hallux with ulceration and cellulitis. Orthopedic surgery was consulted status post left foot first ray amputation on 03/29/2023. Continue narcotics for analgesics. Currently on a bowel regimen. Appreciate Ortho's assistance, antibiotics per Ortho.  Diabetes mellitus type 2 with peripheral neuropathy: A1c of 7.3, blood glucose improving. Continue current regimen.  Essential hypertension: Pressures relatively well-controlled. Continue metoprolol.  History of BPH: Continue Flomax.  Hyperlipidemia: Continue statins.  Bipolar 1 disorder, mixed (HCC) Continue risperidone, lithium, Depakote and benztropine  Tobacco use disorder Counseling.   DVT prophylaxis: lovenox Family Communication:none Status is: Inpatient Remains inpatient appropriate because: Acute osteomyelitis    Code Status:     Code Status Orders  (From admission, onward)           Start     Ordered   03/25/23 1529  Full code  Continuous       Question:  By:  Answer:  Consent: discussion documented in EHR   03/25/23 1528           Code Status History     Date Active Date Inactive Code Status Order ID Comments User Context   02/12/2023 0404 02/15/2023 2201 Full Code [id number]  Frankey Shown, DO ED   11/22/2019 0508 11/24/2019 1957 Full Code [id number]  Frankey Shown,  DO ED   06/11/2019 0925 06/15/2019 1744 Full Code [id number]  Kendell Bane, MD Inpatient   03/09/2019 0625 03/13/2019 1941 Full Code [id number]  Bobette Mo, MD ED   03/09/2019 0411 03/09/2019 0625 Full Code [id number]  Bobette Mo, MD ED   04/27/2015 2025 05/04/2015 1433 Full Code [id number]  Audery Amel, MD Inpatient   04/05/2015 1816 04/09/2015 1732 Full Code [id number]  Houston Siren, MD Inpatient      Advance Directive Documentation    Flowsheet Row Most Recent Value  Type of Advance Directive Healthcare Power of Attorney  Pre-existing out of facility DNR order (yellow form or pink MOST form) --  "MOST" Form in Place? --         IV Access:   Peripheral IV   Procedures and diagnostic studies:   No results found.    Medical Consultants:   None.   Subjective:    Laythan Binney Seavey relates his pain is controlled has not had a bowel movement.  Objective:    Vitals:   03/29/23 2144 03/30/23 0055 03/30/23 0816 03/30/23 0900  BP: 115/77 119/74 120/74   Pulse: 71 62 70   Resp: 18  17   Temp: 98.1 F (36.7 C) 99.3 F (37.4 C) 98.2 F (36.8 C)   TempSrc: Oral Oral Oral   SpO2: 94% 92% 91% 92%  Weight:      Height:       SpO2: 92 % O2 Flow Rate (L/min): 2 L/min FiO2 (%): 28 %   Intake/Output Summary (Last 24 hours) at 03/30/2023 4166 Last data filed at 03/30/2023 [phone]  Gross per 24 hour  Intake 1310 ml  Output 2510 ml  Net -1200 ml   Filed Weights   03/25/23 1628 03/27/23 0100 03/29/23 1320  Weight: 95.4 kg 95.1 kg 99.8 kg    Exam: General exam: In no acute distress. Respiratory system: Good air movement and clear to auscultation. Cardiovascular system: S1 & S2 heard, RRR. No JVD. Gastrointestinal system: Abdomen is nondistended, soft and nontender.  Extremities: No pedal edema. Skin: No rashes, lesions or ulcers Psychiatry: Judgement and insight appear normal. Mood & affect appropriate.  Data Reviewed:    Labs: Basic Metabolic  Panel: Recent Labs  Lab 03/25/23 1145 03/26/23 0419 03/28/23 0635  NA 139 142 139  K 4.1 4.8 4.6  CL 103 109 110  CO2 25 27 23   GLUCOSE 132* 105* 170*  BUN 25* 21 23  CREATININE 1.83* 1.62* 1.64*  CALCIUM 8.8* 8.7* 8.7*   GFR Estimated Creatinine Clearance: 59.7 mL/min (A) (by C-G formula based on SCr of 1.64 mg/dL (H)). Liver Function Tests: Recent Labs  Lab 03/25/23 1145  AST 14*  ALT 12  ALKPHOS 52  BILITOT 0.4  PROT 7.5  ALBUMIN 2.6*   No results for input(s): "LIPASE", "AMYLASE" in the last 168 hours. No results for input(s): "AMMONIA" in the last 168 hours. Coagulation profile No results for input(s): "INR", "PROTIME" in the last 168 hours. COVID-19 Labs  No results for input(s): "DDIMER", "FERRITIN", "LDH", "CRP" in the last 72 hours.   Lab Results  Component Value Date   SARSCOV2NAA NEGATIVE 02/12/2023   SARSCOV2NAA NEGATIVE 12/23/2021   SARSCOV2NAA NEGATIVE 11/22/2019   SARSCOV2NAA NEGATIVE 06/10/2019    CBC: Recent Labs  Lab 03/25/23 1145  WBC 9.9  NEUTROABS 6.5  HGB 10.8*  HCT 35.8*  MCV 100.0  PLT 395   Cardiac Enzymes: No results for input(s): "CKTOTAL", "CKMB", "CKMBINDEX", "TROPONINI" in the last 168 hours. BNP (last 3 results) No results for input(s): "PROBNP" in the last 8760 hours. CBG: Recent Labs  Lab 03/29/23 1315 03/29/23 1512 03/29/23 1628 03/29/23 2221 03/30/23 0818  GLUCAP 109* 132* 142* 225* 155*   D-Dimer: No results for input(s): "DDIMER" in the last 72 hours. Hgb A1c: No results for input(s): "HGBA1C" in the last 72 hours. Lipid Profile: No results for input(s): "CHOL", "HDL", "LDLCALC", "TRIG", "CHOLHDL", "LDLDIRECT" in the last 72 hours. Thyroid function studies: No results for input(s): "TSH", "T4TOTAL", "T3FREE", "THYROIDAB" in the last 72 hours.  Invalid input(s): "FREET3" Anemia work up: No results for input(s): "VITAMINB12", "FOLATE", "FERRITIN", "TIBC", "IRON", "RETICCTPCT" in the last 72  hours. Sepsis Labs: Recent Labs  Lab 03/25/23 1145 03/25/23 1303  WBC 9.9  --   LATICACIDVEN 1.8 1.1   Microbiology Recent Results (from the past 240 hours)  Culture, blood (Routine X 2) w Reflex to ID Panel     Status: None   Collection Time: 03/25/23  6:58 PM   Specimen: BLOOD  Result Value Ref Range Status   Specimen Description BLOOD RIGHT ANTECUBITAL  Final   Special Requests   Final    BOTTLES DRAWN AEROBIC AND ANAEROBIC Blood Culture adequate volume   Culture   Final    NO GROWTH 5 DAYS Performed at [facility], [address], [place], [place] [address]    Report Status 03/30/2023 FINAL  Final  Culture, blood (Routine X 2) w Reflex to ID Panel     Status: None   Collection Time: 03/25/23  7:03 PM   Specimen: BLOOD  Result Value  Ref Range Status   Specimen Description BLOOD LEFT ANTECUBITAL  Final   Special Requests   Final    BOTTLES DRAWN AEROBIC AND ANAEROBIC Blood Culture adequate volume   Culture   Final    NO GROWTH 5 DAYS Performed at [facility], [address], [place], [place] [address]    Report Status 03/30/2023 FINAL  Final  Surgical pcr screen     Status: Abnormal   Collection Time: 03/28/23 10:08 PM   Specimen: Nasal Mucosa; Nasal Swab  Result Value Ref Range Status   MRSA, PCR NEGATIVE NEGATIVE Final   Staphylococcus aureus POSITIVE (A) NEGATIVE Final    Comment: (NOTE) The Xpert SA Assay (FDA approved for NASAL specimens in patients 80 years of age and older), is one component of a comprehensive surveillance program. It is not intended to diagnose infection nor to guide or monitor treatment. Performed at [facility], [address] [place], [place] [address]      Medications:    amLODipine  5 mg Oral Daily   vitamin C  1,000 mg Oral Daily   atorvastatin  40 mg Oral Daily   benztropine  1 mg Oral BID   Chlorhexidine Gluconate Cloth  6 each Topical Q0600   divalproex  1,000 mg Oral QHS   divalproex  500 mg Oral Daily    gabapentin  100 mg Oral QHS   heparin  5,000 Units Subcutaneous Q8H   insulin aspart  0-5 Units Subcutaneous QHS   insulin aspart  0-9 Units Subcutaneous TID WC   insulin aspart  4 Units Subcutaneous TID WC   insulin glargine-yfgn  35 Units Subcutaneous QHS   lithium carbonate  300 mg Oral BID WC   loratadine  10 mg Oral Daily   metoprolol tartrate  12.5 mg Oral BID   multivitamin with minerals  1 tablet Oral Daily   mupirocin ointment  1 Application Nasal BID   nutrition supplement (JUVEN)  1 packet Oral BID BM   risperiDONE  2 mg Oral Daily   tamsulosin  0.4 mg Oral QPC supper   traZODone  50 mg Oral QHS   umeclidinium bromide  1 puff Inhalation Daily   zinc sulfate (50mg  elemental zinc)  220 mg Oral Daily   Continuous Infusions:  sodium chloride     magnesium sulfate bolus IVPB     vancomycin 1,750 mg (03/29/23 1658)      LOS: 5 days   Marinda Elk  [facility]  03/30/2023, 9:03 AM

## 2023-03-31 DIAGNOSIS — M86172 Other acute osteomyelitis, left ankle and foot: Secondary | ICD-10-CM | POA: Diagnosis not present

## 2023-03-31 LAB — GLUCOSE, CAPILLARY
Glucose-Capillary: 179 mg/dL — ABNORMAL HIGH (ref 70–99)
Glucose-Capillary: 187 mg/dL — ABNORMAL HIGH (ref 70–99)
Glucose-Capillary: 151 mg/dL — ABNORMAL HIGH (ref 70–99)
Glucose-Capillary: 193 mg/dL — ABNORMAL HIGH (ref 70–99)

## 2023-03-31 NOTE — Plan of Care (Signed)
  Problem: Education: Goal: Knowledge of General Education information will improve Description: Including pain rating scale, medication(s)/side effects and non-pharmacologic comfort measures Outcome: Progressing   Problem: Health Behavior/Discharge Planning: Goal: Ability to manage health-related needs will improve Outcome: Progressing   Problem: Clinical Measurements: Goal: Ability to maintain clinical measurements within normal limits will improve Outcome: Progressing Goal: Will remain free from infection Outcome: Progressing Goal: Diagnostic test results will improve Outcome: Progressing Goal: Respiratory complications will improve Outcome: Progressing Goal: Cardiovascular complication will be avoided Outcome: Progressing   Problem: Activity: Goal: Risk for activity intolerance will decrease Outcome: Progressing   Problem: Nutrition: Goal: Adequate nutrition will be maintained Outcome: Progressing   Problem: Coping: Goal: Level of anxiety will decrease Outcome: Progressing   Problem: Elimination: Goal: Will not experience complications related to bowel motility Outcome: Progressing Goal: Will not experience complications related to urinary retention Outcome: Progressing   Problem: Pain Management: Goal: General experience of comfort will improve Outcome: Progressing   Problem: Safety: Goal: Ability to remain free from injury will improve Outcome: Progressing   Problem: Skin Integrity: Goal: Risk for impaired skin integrity will decrease Outcome: Progressing   Problem: Education: Goal: Ability to describe self-care measures that may prevent or decrease complications (Diabetes Survival Skills Education) will improve Outcome: Progressing Goal: Individualized Educational Video(s) Outcome: Progressing   Problem: Coping: Goal: Ability to adjust to condition or change in health will improve Outcome: Progressing   Problem: Fluid Volume: Goal: Ability to  maintain a balanced intake and output will improve Outcome: Progressing   Problem: Health Behavior/Discharge Planning: Goal: Ability to identify and utilize available resources and services will improve Outcome: Progressing Goal: Ability to manage health-related needs will improve Outcome: Progressing   Problem: Metabolic: Goal: Ability to maintain appropriate glucose levels will improve Outcome: Progressing   Problem: Nutritional: Goal: Maintenance of adequate nutrition will improve Outcome: Progressing Goal: Progress toward achieving an optimal weight will improve Outcome: Progressing   Problem: Skin Integrity: Goal: Risk for impaired skin integrity will decrease Outcome: Progressing   Problem: Tissue Perfusion: Goal: Adequacy of tissue perfusion will improve Outcome: Progressing   Problem: Education: Goal: Knowledge of the prescribed therapeutic regimen will improve Outcome: Progressing Goal: Ability to verbalize activity precautions or restrictions will improve Outcome: Progressing Goal: Understanding of discharge needs will improve Outcome: Progressing   Problem: Activity: Goal: Ability to perform//tolerate increased activity and mobilize with assistive devices will improve Outcome: Progressing   Problem: Clinical Measurements: Goal: Postoperative complications will be avoided or minimized Outcome: Progressing   Problem: Self-Care: Goal: Ability to meet self-care needs will improve Outcome: Progressing   Problem: Self-Concept: Goal: Ability to maintain and perform role responsibilities to the fullest extent possible will improve Outcome: Progressing   Problem: Pain Management: Goal: Pain level will decrease with appropriate interventions Outcome: Progressing   Problem: Skin Integrity: Goal: Demonstration of wound healing without infection will improve Outcome: Progressing

## 2023-03-31 NOTE — Progress Notes (Signed)
[facility] PROGRESS NOTE    Progress Note  Scott [name]  IEP:[id number] DOB: February 25, 1961 DOA: 03/25/2023 PCP: Benetta Spar, MD     Brief Narrative:   Scott [name] is an 62 y.o. male past medical history significant for diabetes mellitus type 2, hyperlipidemia bipolar disorder comes in from the podiatrist office to [facility] for for secondary diabetic left foot ulcer MRI showed osteomyelitis orthopedic surgery was consulted who recommended transfer to [facility]  Assessment/Plan:   Acute osteomyelitis of metatarsal bone of left foot (HCC) MRI of the foot showed osteomyelitis of the medial hallux with ulceration and cellulitis. Orthopedic surgery was consulted status post left foot first ray amputation on 03/29/2023. Continue narcotics for analgesics. I's and O's are poorly recorded. Currently on a bowel regimen. Further management antibiotics per orthopedic surgery. PT evaluated the patient, will need skilled nursing facility placement.   He is currently on IV vancomycin.  Diabetes mellitus type 2 with peripheral neuropathy: A1c of 7.3, blood glucose improving. Continue current regimen.  Essential hypertension: Pressures relatively well-controlled. Continue metoprolol.  History of BPH: Continue Flomax.  Hyperlipidemia: Continue statins.  Bipolar 1 disorder, mixed (HCC) Continue risperidone, lithium, Depakote and benztropine  Tobacco use disorder Counseling.   DVT prophylaxis: lovenox Family Communication:none Status is: Inpatient Remains inpatient appropriate because: Acute osteomyelitis    Code Status:     Code Status Orders  (From admission, onward)           Start     Ordered   03/25/23 1529  Full code  Continuous       Question:  By:  Answer:  Consent: discussion documented in EHR   03/25/23 1528           Code Status History     Date Active Date Inactive Code Status Order ID Comments User Context    02/12/2023 0404 02/15/2023 2201 Full Code [id number]  Frankey Shown, DO ED   11/22/2019 0508 11/24/2019 1957 Full Code [id number]  Frankey Shown, DO ED   06/11/2019 0925 06/15/2019 1744 Full Code [id number]  Kendell Bane, MD Inpatient   03/09/2019 0625 03/13/2019 1941 Full Code [id number]  Bobette Mo, MD ED   03/09/2019 0411 03/09/2019 0625 Full Code [id number]  Bobette Mo, MD ED   04/27/2015 2025 05/04/2015 1433 Full Code [id number]  Audery Amel, MD Inpatient   04/05/2015 1816 04/09/2015 1732 Full Code [id number]  Houston Siren, MD Inpatient      Advance Directive Documentation    Flowsheet Row Most Recent Value  Type of Advance Directive Healthcare Power of Attorney  Pre-existing out of facility DNR order (yellow form or pink MOST form) --  "MOST" Form in Place? --         IV Access:   Peripheral IV   Procedures and diagnostic studies:   No results found.    Medical Consultants:   None.   Subjective:    Scott [name] had a bowel movement pain is controlled.  Objective:    Vitals:   03/30/23 2140 03/31/23 0357 03/31/23 0825 03/31/23 0831  BP: 139/74 135/84  133/81  Pulse: 78 83  73  Resp: 19 20  19   Temp: 98.6 F (37 C) 98.6 F (37 C)  98.9 F (37.2 C)  TempSrc: Oral Oral  Oral  SpO2: 92% 98% 93% 93%  Weight:      Height:       SpO2: 93 % O2 Flow Rate (L/min): 2 L/min  FiO2 (%): 28 %   Intake/Output Summary (Last 24 hours) at 03/31/2023 0852 Last data filed at 03/31/2023 0831 Gross per 24 hour  Intake 840 ml  Output 2550 ml  Net -1710 ml   Filed Weights   03/25/23 1628 03/27/23 0100 03/29/23 1320  Weight: 95.4 kg 95.1 kg 99.8 kg    Exam: General exam: In no acute distress. Respiratory system: Good air movement and clear to auscultation. Cardiovascular system: S1 & S2 heard, RRR. No JVD. Gastrointestinal system: Abdomen is nondistended, soft and nontender.  Extremities: No pedal edema. Skin: No rashes, lesions or  ulcers  Data Reviewed:    Labs: Basic Metabolic Panel: Recent Labs  Lab 03/25/23 1145 03/26/23 0419 03/28/23 0635  NA 139 142 139  K 4.1 4.8 4.6  CL 103 109 110  CO2 25 27 23   GLUCOSE 132* 105* 170*  BUN 25* 21 23  CREATININE 1.83* 1.62* 1.64*  CALCIUM 8.8* 8.7* 8.7*   GFR Estimated Creatinine Clearance: 59.7 mL/min (A) (by C-G formula based on SCr of 1.64 mg/dL (H)). Liver Function Tests: Recent Labs  Lab 03/25/23 1145  AST 14*  ALT 12  ALKPHOS 52  BILITOT 0.4  PROT 7.5  ALBUMIN 2.6*   No results for input(s): "LIPASE", "AMYLASE" in the last 168 hours. No results for input(s): "AMMONIA" in the last 168 hours. Coagulation profile No results for input(s): "INR", "PROTIME" in the last 168 hours. COVID-19 Labs  No results for input(s): "DDIMER", "FERRITIN", "LDH", "CRP" in the last 72 hours.   Lab Results  Component Value Date   SARSCOV2NAA NEGATIVE 02/12/2023   SARSCOV2NAA NEGATIVE 12/23/2021   SARSCOV2NAA NEGATIVE 11/22/2019   SARSCOV2NAA NEGATIVE 06/10/2019    CBC: Recent Labs  Lab 03/25/23 1145  WBC 9.9  NEUTROABS 6.5  HGB 10.8*  HCT 35.8*  MCV 100.0  PLT 395   Cardiac Enzymes: No results for input(s): "CKTOTAL", "CKMB", "CKMBINDEX", "TROPONINI" in the last 168 hours. BNP (last 3 results) No results for input(s): "PROBNP" in the last 8760 hours. CBG: Recent Labs  Lab 03/30/23 0818 03/30/23 1206 03/30/23 1650 03/30/23 2142 03/31/23 0829  GLUCAP 155* 165* 177* 215* 179*   D-Dimer: No results for input(s): "DDIMER" in the last 72 hours. Hgb A1c: No results for input(s): "HGBA1C" in the last 72 hours. Lipid Profile: No results for input(s): "CHOL", "HDL", "LDLCALC", "TRIG", "CHOLHDL", "LDLDIRECT" in the last 72 hours. Thyroid function studies: No results for input(s): "TSH", "T4TOTAL", "T3FREE", "THYROIDAB" in the last 72 hours.  Invalid input(s): "FREET3" Anemia work up: No results for input(s): "VITAMINB12", "FOLATE", "FERRITIN",  "TIBC", "IRON", "RETICCTPCT" in the last 72 hours. Sepsis Labs: Recent Labs  Lab 03/25/23 1145 03/25/23 1303  WBC 9.9  --   LATICACIDVEN 1.8 1.1   Microbiology Recent Results (from the past 240 hours)  Culture, blood (Routine X 2) w Reflex to ID Panel     Status: None   Collection Time: 03/25/23  6:58 PM   Specimen: BLOOD  Result Value Ref Range Status   Specimen Description BLOOD RIGHT ANTECUBITAL  Final   Special Requests   Final    BOTTLES DRAWN AEROBIC AND ANAEROBIC Blood Culture adequate volume   Culture   Final    NO GROWTH 5 DAYS Performed at [facility], [address] [place], [place] [address]    Report Status 03/30/2023 FINAL  Final  Culture, blood (Routine X 2) w Reflex to ID Panel     Status: None   Collection Time: 03/25/23  7:03 PM   Specimen: BLOOD  Result Value Ref Range Status   Specimen Description BLOOD LEFT ANTECUBITAL  Final   Special Requests   Final    BOTTLES DRAWN AEROBIC AND ANAEROBIC Blood Culture adequate volume   Culture   Final    NO GROWTH 5 DAYS Performed at [facility], [address], [place], [place] [address]    Report Status 03/30/2023 FINAL  Final  Surgical pcr screen     Status: Abnormal   Collection Time: 03/28/23 10:08 PM   Specimen: Nasal Mucosa; Nasal Swab  Result Value Ref Range Status   MRSA, PCR NEGATIVE NEGATIVE Final   Staphylococcus aureus POSITIVE (A) NEGATIVE Final    Comment: (NOTE) The Xpert SA Assay (FDA approved for NASAL specimens in patients 15 years of age and older), is one component of a comprehensive surveillance program. It is not intended to diagnose infection nor to guide or monitor treatment. Performed at [facility], [address], [place], [place] [address]      Medications:    amLODipine  5 mg Oral Daily   vitamin C  1,000 mg Oral Daily   atorvastatin  40 mg Oral Daily   benztropine  1 mg Oral BID   Chlorhexidine Gluconate Cloth  6 each Topical Q0600   divalproex  1,000 mg Oral QHS    divalproex  500 mg Oral Daily   gabapentin  100 mg Oral QHS   heparin  5,000 Units Subcutaneous Q8H   insulin aspart  0-5 Units Subcutaneous QHS   insulin aspart  0-9 Units Subcutaneous TID WC   insulin aspart  4 Units Subcutaneous TID WC   insulin glargine-yfgn  35 Units Subcutaneous QHS   lithium carbonate  300 mg Oral BID WC   loratadine  10 mg Oral Daily   metoprolol tartrate  12.5 mg Oral BID   multivitamin with minerals  1 tablet Oral Daily   mupirocin ointment  1 Application Nasal BID   nutrition supplement (JUVEN)  1 packet Oral BID BM   polyethylene glycol  17 g Oral BID   risperiDONE  2 mg Oral Daily   tamsulosin  0.4 mg Oral QPC supper   traZODone  50 mg Oral QHS   umeclidinium bromide  1 puff Inhalation Daily   zinc sulfate (50mg  elemental zinc)  220 mg Oral Daily   Continuous Infusions:  sodium chloride     magnesium sulfate bolus IVPB        LOS: 6 days   Marinda Elk  [facility]  03/31/2023, 8:52 AM

## 2023-03-31 NOTE — Plan of Care (Signed)
  Problem: Education: Goal: Knowledge of General Education information will improve Description: Including pain rating scale, medication(s)/side effects and non-pharmacologic comfort measures Outcome: Progressing   Problem: Nutrition: Goal: Adequate nutrition will be maintained Outcome: Progressing   Problem: Coping: Goal: Level of anxiety will decrease Outcome: Progressing   Problem: Safety: Goal: Ability to remain free from injury will improve Outcome: Progressing   Problem: Skin Integrity: Goal: Risk for impaired skin integrity will decrease Outcome: Progressing   Problem: Skin Integrity: Goal: Risk for impaired skin integrity will decrease Outcome: Progressing   Problem: Education: Goal: Knowledge of the prescribed therapeutic regimen will improve Outcome: Progressing

## 2023-04-01 ENCOUNTER — Encounter (HOSPITAL_COMMUNITY): Payer: Self-pay | Admitting: Orthopedic Surgery

## 2023-04-01 DIAGNOSIS — I10 Essential (primary) hypertension: Secondary | ICD-10-CM

## 2023-04-01 DIAGNOSIS — M86172 Other acute osteomyelitis, left ankle and foot: Secondary | ICD-10-CM | POA: Diagnosis not present

## 2023-04-01 DIAGNOSIS — F3160 Bipolar disorder, current episode mixed, unspecified: Secondary | ICD-10-CM | POA: Diagnosis not present

## 2023-04-01 LAB — GLUCOSE, CAPILLARY
Glucose-Capillary: 141 mg/dL — ABNORMAL HIGH (ref 70–99)
Glucose-Capillary: 171 mg/dL — ABNORMAL HIGH (ref 70–99)
Glucose-Capillary: 218 mg/dL — ABNORMAL HIGH (ref 70–99)
Glucose-Capillary: 318 mg/dL — ABNORMAL HIGH (ref 70–99)

## 2023-04-01 NOTE — Plan of Care (Signed)
  Problem: Education: Goal: Knowledge of General Education information will improve Description: Including pain rating scale, medication(s)/side effects and non-pharmacologic comfort measures Outcome: Progressing   Problem: Health Behavior/Discharge Planning: Goal: Ability to manage health-related needs will improve Outcome: Progressing   Problem: Clinical Measurements: Goal: Ability to maintain clinical measurements within normal limits will improve Outcome: Progressing Goal: Will remain free from infection Outcome: Progressing Goal: Diagnostic test results will improve Outcome: Progressing Goal: Respiratory complications will improve Outcome: Progressing Goal: Cardiovascular complication will be avoided Outcome: Progressing   Problem: Activity: Goal: Risk for activity intolerance will decrease Outcome: Progressing   Problem: Nutrition: Goal: Adequate nutrition will be maintained Outcome: Progressing   Problem: Coping: Goal: Level of anxiety will decrease Outcome: Progressing   Problem: Elimination: Goal: Will not experience complications related to bowel motility Outcome: Progressing Goal: Will not experience complications related to urinary retention Outcome: Progressing   Problem: Pain Management: Goal: General experience of comfort will improve Outcome: Progressing   Problem: Safety: Goal: Ability to remain free from injury will improve Outcome: Progressing   Problem: Skin Integrity: Goal: Risk for impaired skin integrity will decrease Outcome: Progressing   Problem: Education: Goal: Ability to describe self-care measures that may prevent or decrease complications (Diabetes Survival Skills Education) will improve Outcome: Progressing Goal: Individualized Educational Video(s) Outcome: Progressing   Problem: Coping: Goal: Ability to adjust to condition or change in health will improve Outcome: Progressing   Problem: Fluid Volume: Goal: Ability to  maintain a balanced intake and output will improve Outcome: Progressing   Problem: Health Behavior/Discharge Planning: Goal: Ability to identify and utilize available resources and services will improve Outcome: Progressing Goal: Ability to manage health-related needs will improve Outcome: Progressing   Problem: Metabolic: Goal: Ability to maintain appropriate glucose levels will improve Outcome: Progressing   Problem: Nutritional: Goal: Maintenance of adequate nutrition will improve Outcome: Progressing Goal: Progress toward achieving an optimal weight will improve Outcome: Progressing   Problem: Skin Integrity: Goal: Risk for impaired skin integrity will decrease Outcome: Progressing   Problem: Tissue Perfusion: Goal: Adequacy of tissue perfusion will improve Outcome: Progressing   Problem: Education: Goal: Knowledge of the prescribed therapeutic regimen will improve Outcome: Progressing Goal: Ability to verbalize activity precautions or restrictions will improve Outcome: Progressing Goal: Understanding of discharge needs will improve Outcome: Progressing   Problem: Activity: Goal: Ability to perform//tolerate increased activity and mobilize with assistive devices will improve Outcome: Progressing   Problem: Clinical Measurements: Goal: Postoperative complications will be avoided or minimized Outcome: Progressing   Problem: Self-Care: Goal: Ability to meet self-care needs will improve Outcome: Progressing   Problem: Self-Concept: Goal: Ability to maintain and perform role responsibilities to the fullest extent possible will improve Outcome: Progressing   Problem: Pain Management: Goal: Pain level will decrease with appropriate interventions Outcome: Progressing   Problem: Skin Integrity: Goal: Demonstration of wound healing without infection will improve Outcome: Progressing

## 2023-04-01 NOTE — Care Management Important Message (Signed)
Important Message  Patient Details  Name: Scott [name] MRN: [id number] Date of Birth: [birth year]   Important Message Given:  Yes - Medicare IM     Sherilyn Banker 04/01/2023, 3:25 PM

## 2023-04-01 NOTE — TOC Progression Note (Signed)
Transition of Care [facility]) - Progression Note    Patient Details  Name: [name] MRN: [id number] Date of Birth: 1960/05/15  Transition of Care [facility]) CM/SW Contact  Carley Hammed, LCSW Phone Number: 04/01/2023, 3:38 PM  Clinical Narrative:    CSW notified by PT of interventions needed in order to return to ALF. CSW spoke with Tammy at ALF who asked if pt had a PICC and that they could not accept with a PICC. CSW confirmed with MD that pt will not DC with PICC or IV ABX. CSW attempted to follow up with facility in order to discuss transfer back, message left. TOC will continue to follow.   Expected Discharge Plan: Assisted Living Barriers to Discharge: Continued Medical Work up  Expected Discharge Plan and Services In-house Referral: Clinical Social Work   Post Acute Care Choice: Resumption of Svcs/PTA Provider (ALF) Living arrangements for the past 2 months: Assisted Living Facility                                       Social Determinants of Health (SDOH) Interventions SDOH Screenings   Food Insecurity: No Food Insecurity (03/25/2023)  Housing: Low Risk  (03/25/2023)  Transportation Needs: No Transportation Needs (03/25/2023)  Utilities: Not At Risk (03/25/2023)  Tobacco Use: Low Risk  (03/29/2023)    Readmission Risk Interventions    03/26/2023   12:38 PM 02/12/2023   10:56 AM  Readmission Risk Prevention Plan  Transportation Screening Complete Complete  PCP or Specialist Appt within 3-5 Days Not Complete   HRI or Home Care Consult Complete Complete  Social Work Consult for Recovery Care Planning/Counseling Complete Complete  Palliative Care Screening Not Applicable Not Applicable  Medication Review Oceanographer) Complete Complete

## 2023-04-01 NOTE — Progress Notes (Signed)
[facility]                                                                               Scott [name], is a 62 y.o. male, DOB - 03/27/1961, UXL:[id number] Admit date - 03/25/2023    Outpatient Primary MD for the patient is Fanta, Wayland Salinas, MD  LOS - 7  days    Brief summary   Scott [name] is an 62 y.o. male past medical history significant for diabetes mellitus type 2, hyperlipidemia bipolar disorder comes in from the podiatrist office to [facility] for for secondary diabetic left foot ulcer MRI showed osteomyelitis orthopedic surgery was consulted who recommended transfer to [facility]    Assessment & Plan    Assessment and Plan:  Acute osteomyelitis of the metatarsal bone of the left foot Orthopedics consulted on board s/p left foot first ray amputation on 12/20 Pain control and therapy evaluations recommending higher level of care than ALF.    Type 2 diabetes mellitus with peripheral neuropathy A1c 7.3 Continue with home regimen   Essential hypertension  blood pressure parameters are optimal   BPH Continue with Flomax  Hyperlipidemia Continue with statin   Bipolar disorder Continue with risperidone, lithium, Depakote and benztropine   Tobacco use disorder Counseling given   Estimated body mass index is 28.25 kg/m as calculated from the following:   Height as of this encounter: 6\' 2"  (1.88 m).   Weight as of this encounter: 99.8 kg.  Code Status: full code DVT Prophylaxis:  SCD's Start: 03/29/23 1605 heparin injection 5,000 Units Start: 03/25/23 2200   Level of Care: Level of care: Med-Surg Family Communication: none at bedside.   Disposition Plan:     Remains inpatient appropriate:  SNF vs ALF.  Procedures:  status post left foot first ray amputation on 03/29/2023.   Consultants:   ORTHOPEDICS  Antimicrobials:   Anti-infectives (From admission, onward)    Start     Dose/Rate Route Frequency Ordered  Stop   03/29/23 2200  ceFAZolin (ANCEF) IVPB 2g/100 mL premix        2 g 200 mL/hr over 30 Minutes Intravenous Every 8 hours 03/29/23 1604 03/30/23 0607   03/29/23 1310  ceFAZolin (ANCEF) 2-4 GM/100ML-% IVPB       Note to Pharmacy: Shanda Bumps M: cabinet override      03/29/23 1310 03/30/23 0114   03/29/23 1200  ceFAZolin (ANCEF) IVPB 2g/100 mL premix        2 g 200 mL/hr over 30 Minutes Intravenous To ShortStay Surgical 03/28/23 2209 03/29/23 1448   03/27/23 1600  vancomycin (VANCOCIN) 1,750 mg in sodium chloride 0.9 % 500 mL IVPB        1,750 mg 258.8 mL/hr over 120 Minutes Intravenous Every 24 hours 03/27/23 1009 03/30/23 2015   03/26/23 1600  vancomycin (VANCOREADY) IVPB 750 mg/150 mL  Status:  Discontinued        750 mg 150 mL/hr over 60 Minutes Intravenous Every 24 hours 03/26/23 1352 03/27/23 1009   03/26/23 1500  vancomycin (VANCOCIN) IVPB 1000 mg/200 mL premix  Status:  Discontinued        1,000 mg 200  mL/hr over 60 Minutes Intravenous Every 24 hours 03/26/23 1352 03/27/23 1009   03/26/23 1400  vancomycin (VANCOREADY) IVPB 1750 mg/350 mL  Status:  Discontinued        1,750 mg 175 mL/hr over 120 Minutes Intravenous Every 24 hours 03/25/23 1338 03/26/23 1352   03/25/23 2200  vancomycin (VANCOREADY) IVPB 500 mg/100 mL        500 mg 100 mL/hr over 60 Minutes Intravenous  Once 03/25/23 1347 03/25/23 2302   03/25/23 1330  vancomycin (VANCOCIN) IVPB 1000 mg/200 mL premix        1,000 mg 200 mL/hr over 60 Minutes Intravenous Every 1 hr x 2 03/25/23 1311 03/25/23 1647   03/25/23 1300  ceFEPIme (MAXIPIME) 2 g in sodium chloride 0.9 % 100 mL IVPB        2 g 200 mL/hr over 30 Minutes Intravenous  Once 03/25/23 1258 03/25/23 1353        Medications  Scheduled Meds:  amLODipine  5 mg Oral Daily   vitamin C  1,000 mg Oral Daily   atorvastatin  40 mg Oral Daily   benztropine  1 mg Oral BID   Chlorhexidine Gluconate Cloth  6 each Topical Q0600   divalproex  1,000 mg Oral QHS    divalproex  500 mg Oral Daily   gabapentin  100 mg Oral QHS   heparin  5,000 Units Subcutaneous Q8H   insulin aspart  0-5 Units Subcutaneous QHS   insulin aspart  0-9 Units Subcutaneous TID WC   insulin aspart  4 Units Subcutaneous TID WC   insulin glargine-yfgn  35 Units Subcutaneous QHS   lithium carbonate  300 mg Oral BID WC   loratadine  10 mg Oral Daily   metoprolol tartrate  12.5 mg Oral BID   multivitamin with minerals  1 tablet Oral Daily   mupirocin ointment  1 Application Nasal BID   nutrition supplement (JUVEN)  1 packet Oral BID BM   risperiDONE  2 mg Oral Daily   tamsulosin  0.4 mg Oral QPC supper   traZODone  50 mg Oral QHS   umeclidinium bromide  1 puff Inhalation Daily   zinc sulfate (50mg  elemental zinc)  220 mg Oral Daily   Continuous Infusions:  magnesium sulfate bolus IVPB     PRN Meds:.acetaminophen, HYDROmorphone (DILAUDID) injection, ipratropium-albuterol, magnesium sulfate bolus IVPB, ondansetron **OR** ondansetron (ZOFRAN) IV, oxyCODONE, oxyCODONE, potassium chloride    Subjective:   Scott [name] was seen and examined today.  Does nt remember last night.    Objective:   Vitals:   03/31/23 1613 03/31/23 2040 04/01/23 0349 04/01/23 0759  BP: 105/89 127/85 (!) 120/104 137/85  Pulse: 87 85 91 79  Resp: 16 16 18 17   Temp: (!) 97.3 F (36.3 C) 98.7 F (37.1 C) 98 F (36.7 C) 98.1 F (36.7 C)  TempSrc: Oral Oral Oral Oral  SpO2:  94% 100% 98%  Weight:      Height:        Intake/Output Summary (Last 24 hours) at 04/01/2023 1332 Last data filed at 04/01/2023 1330 Gross per 24 hour  Intake 900 ml  Output 2950 ml  Net -2050 ml   Filed Weights   03/25/23 1628 03/27/23 0100 03/29/23 1320  Weight: 95.4 kg 95.1 kg 99.8 kg     Exam General exam: Appears calm and comfortable  Respiratory system: Clear to auscultation. Respiratory effort normal. Cardiovascular system: S1 & S2 heard, RRR. Gastrointestinal system: Abdomen is nondistended,  soft and  nontender. Central nervous system: Alert and oriented to person and place.  Extremities: Symmetric 5 x 5 power. Skin: No rashes,  Psychiatry: Mood & affect appropriate.    Data Reviewed:  I have personally reviewed following labs and imaging studies   CBC Lab Results  Component Value Date   WBC 9.9 03/25/2023   RBC 3.58 (L) 03/25/2023   HGB 10.8 (L) 03/25/2023   HCT 35.8 (L) 03/25/2023   MCV 100.0 03/25/2023   MCH 30.2 03/25/2023   PLT 395 03/25/2023   MCHC 30.2 03/25/2023   RDW 15.4 03/25/2023   LYMPHSABS 2.6 03/25/2023   MONOABS 0.5 03/25/2023   EOSABS 0.1 03/25/2023   BASOSABS 0.1 03/25/2023     Last metabolic panel Lab Results  Component Value Date   NA 139 03/28/2023   K 4.6 03/28/2023   CL 110 03/28/2023   CO2 23 03/28/2023   BUN 23 03/28/2023   CREATININE 1.64 (H) 03/28/2023   GLUCOSE 170 (H) 03/28/2023   GFRNONAA 47 (L) 03/28/2023   GFRAA 78 05/10/2020   CALCIUM 8.7 (L) 03/28/2023   PHOS 3.8 02/12/2023   PROT 7.5 03/25/2023   ALBUMIN 2.6 (L) 03/25/2023   LABGLOB 3.1 09/04/2022   AGRATIO 1.2 09/04/2022   BILITOT 0.4 03/25/2023   ALKPHOS 52 03/25/2023   AST 14 (L) 03/25/2023   ALT 12 03/25/2023   ANIONGAP 6 03/28/2023    CBG (last 3)  Recent Labs    03/31/23 2109 04/01/23 0800 04/01/23 1135  GLUCAP 193* 141* 171*      Coagulation Profile: No results for input(s): "INR", "PROTIME" in the last 168 hours.   Radiology Studies: No results found.     Kathlen Mody M.D. [facility] 04/01/2023, 1:32 PM  Available via Epic secure chat 7am-7pm After 7 pm, please refer to night coverage provider listed on amion.

## 2023-04-01 NOTE — Care Plan (Signed)
The writer went to check the patient and the patient was sitting in the middle of the bed with his feets down and informed that he feel down. The bed alarm was on and never went off. The patient was alert oriented times 2 and was confused in the beginning and told again that he fell on to the bed from above, on to the bed. There was a oozing noted on the surgical site and marked with the black marker. Then the patient later on told that he really do not know whether he fell. The NP Reuel Boom was communicated through secure chat about this. Did reinforced the dressing with Kerlix as per his advice. The charge nurse was with primary nurse all the time.The vitals was taken and recorded. The bed alarm was checked and reinforced the patient to call for help if needed.

## 2023-04-01 NOTE — Progress Notes (Signed)
Physical Therapy Treatment Patient Details Name: KYEN PUERTO MRN: [id number] DOB: 07-04-1960 Today's Date: 04/01/2023   History of Present Illness Pt is a 62 year old male comes in from the podiatrist office to [facility] for secondary diabetic left foot ulcer MRI showed osteomyelitis orthopedic surgery was consulted who recommended transfer to [facility]. Pt is now s/p L 1st Ray amputation. Past medical history significant for diabetes mellitus type 2, hyperlipidemia bipolar disorder.    PT Comments  Pt supine in bed on arrival this session.  He was unable to recall weight bearing status.  PTA re-educated on weight bearing status to progress gt training at this time.  He continues to put too much weight despite cues.  He will need to d/c with WC until foot heals.      If plan is discharge home, recommend the following: A lot of help with walking and/or transfers;A little help with bathing/dressing/bathroom;Assistance with cooking/housework;Direct supervision/assist for medications management;Assist for transportation;Help with stairs or ramp for entrance   Can travel by private vehicle     No  Equipment Recommendations  Wheelchair (measurements PT);Wheelchair cushion (measurements PT) (elevating leg rests)    Recommendations for Other Services       Precautions / Restrictions Precautions Precautions: Fall Restrictions Weight Bearing Restrictions Per Provider Order: Yes LLE Weight Bearing Per Provider Order: Touchdown weight bearing     Mobility  Bed Mobility Overal bed mobility: Needs Assistance Bed Mobility: Supine to Sit, Sit to Supine     Supine to sit: Supervision Sit to supine: Min assist   General bed mobility comments: Assistance to move LE back to bed and tactile cueing for positioning in bed.  Pt required total assistance to donn post op shoe to L foot and doff before returning to bed.    Transfers Overall transfer level: Needs  assistance Equipment used: Rolling walker (2 wheels) Transfers: Sit to/from Stand Sit to Stand: Min assist           General transfer comment: Pt attempted without assistance and cues for hand placement and forward weight shifting.  He was unable to rise into standing during 3 unassisted attempts.  PTA Provided assistance on 4th trial to achieve standing.  Stand to sit he presents with poor eccentric load.    Ambulation/Gait Ambulation/Gait assistance: Contact guard assist Gait Distance (Feet): 10 Feet Assistive device: Rolling walker (2 wheels) Gait Pattern/deviations: Step-to pattern, Trunk flexed       General Gait Details: Cues for hand placement and to push through device to maintain TDWB.  PTA demonstrated it prior to attempt but he remained unable to follow commands to maintain TDWB safely to progress gt distance.  Informed care team he will need a WC at d/c until foot heals to maintain proper weight bearing during the healing process.   Stairs             Wheelchair Mobility     Tilt Bed    Modified Rankin (Stroke Patients Only)       Balance Overall balance assessment: Needs assistance Sitting-balance support: Bilateral upper extremity supported, Feet supported Sitting balance-Leahy Scale: Fair       Standing balance-Leahy Scale: Poor Standing balance comment: Reliant on RW                            Cognition Arousal: Alert Behavior During Therapy: Flat affect Overall Cognitive Status: Within Functional Limits for tasks assessed  General Comments: slow processing and unable to follow commands to maintain tdwb status this session.        Exercises      General Comments        Pertinent Vitals/Pain Pain Assessment Pain Assessment: 0-10 Pain Score: 2  Pain Location: L foot Pain Descriptors / Indicators: Constant, Discomfort, Sore    Home Living                           Prior Function            PT Goals (current goals can now be found in the care plan section) Acute Rehab PT Goals Patient Stated Goal: to get better Potential to Achieve Goals: Fair Progress towards PT goals: Progressing toward goals    Frequency    Min 1X/week      PT Plan      Co-evaluation              AM-PAC PT "6 Clicks" Mobility   Outcome Measure  Help needed turning from your back to your side while in a flat bed without using bedrails?: A Little Help needed moving from lying on your back to sitting on the side of a flat bed without using bedrails?: A Little Help needed moving to and from a bed to a chair (including a wheelchair)?: A Little Help needed standing up from a chair using your arms (e.g., wheelchair or bedside chair)?: A Little Help needed to walk in hospital room?: A Little Help needed climbing 3-5 steps with a railing? : A Little 6 Click Score: 18    End of Session Equipment Utilized During Treatment: Gait belt Activity Tolerance: Other (comment) (limited due to innnability to maintain weight bearing.) Patient left: in bed;with call bell/phone within reach;with bed alarm set Nurse Communication: Mobility status PT Visit Diagnosis: Other abnormalities of gait and mobility (R26.89)     Time: 6578-4696 PT Time Calculation (min) (ACUTE ONLY): 23 min  Charges:    $Gait Training: 8-22 mins $Therapeutic Activity: 8-22 mins PT General Charges $$ ACUTE PT VISIT: 1 Visit                     Bonney Leitz , PTA Acute Rehabilitation Services Office [phone]    Florestine Avers 04/01/2023, 2:01 PM

## 2023-04-02 DIAGNOSIS — F17200 Nicotine dependence, unspecified, uncomplicated: Secondary | ICD-10-CM | POA: Diagnosis not present

## 2023-04-02 DIAGNOSIS — I10 Essential (primary) hypertension: Secondary | ICD-10-CM | POA: Diagnosis not present

## 2023-04-02 DIAGNOSIS — M86172 Other acute osteomyelitis, left ankle and foot: Secondary | ICD-10-CM | POA: Diagnosis not present

## 2023-04-02 DIAGNOSIS — F3160 Bipolar disorder, current episode mixed, unspecified: Secondary | ICD-10-CM | POA: Diagnosis not present

## 2023-04-02 LAB — GLUCOSE, CAPILLARY: Glucose-Capillary: 169 mg/dL — ABNORMAL HIGH (ref 70–99)

## 2023-04-02 MED ORDER — INSULIN GLARGINE-YFGN 100 UNIT/ML ~~LOC~~ SOLN: 35.0000 [IU] | Freq: Every day | SUBCUTANEOUS | 11 refills | Status: DC

## 2023-04-02 MED ORDER — FUROSEMIDE 20 MG PO TABS: 20.0000 mg | ORAL_TABLET | Freq: Every day | ORAL | Status: AC

## 2023-04-02 MED ORDER — IPRATROPIUM-ALBUTEROL 0.5-2.5 (3) MG/3ML IN SOLN: 3.0000 mL | RESPIRATORY_TRACT | Status: DC | PRN

## 2023-04-02 MED ORDER — JUVEN PO PACK: 1.0000 | PACK | Freq: Two times a day (BID) | ORAL | Status: AC
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
## 2023-04-02 NOTE — Discharge Summary (Addendum)
Physician Discharge Summary   Patient: Scott [name] MRN: [id number] DOB: 09-10-1960  Admit date:     03/25/2023  Discharge date: 04/02/23  Discharge Physician: Kathlen Mody   PCP: Benetta Spar, MD   Recommendations at discharge:  Please follow up with Dr Lajoyce Corners as recommended.   Discharge Diagnoses: Principal Problem:   Acute osteomyelitis of metatarsal bone of left foot (HCC) Active Problems:   Bipolar 1 disorder, mixed (HCC)   Tobacco use disorder   Dyslipidemia   Essential hypertension, benign  Resolved Problems:   * No resolved hospital problems. *  Hospital Course:  Scott [name] is an 62 y.o. male past medical history significant for diabetes mellitus type 2, hyperlipidemia bipolar disorder comes in from the podiatrist office to [facility] for for secondary diabetic left foot ulcer MRI showed osteomyelitis orthopedic surgery was consulted who recommended transfer to [facility]  Assessment and Plan:    Acute osteomyelitis of the metatarsal bone of the left foot Orthopedics consulted on board s/p left foot first ray amputation on 12/20 Pain control and PT /OT EVAL.       Type 2 diabetes mellitus with peripheral neuropathy A1c 7.3 D/c metformin as his creatinine is around 1.6.  Continue with lantus.  Hold glipizide.      Essential hypertension  blood pressure parameters are optimal     BPH Continue with Flomax   Hyperlipidemia Continue with statin     Bipolar disorder Continue with risperidone, lithium, Depakote and benztropine     Tobacco use disorder Counseling given     Estimated body mass index is 28.25 kg/m as calculated from the following:   Height as of this encounter: 6\' 2"  (1.88 m).   Weight as of this encounter: 99.8 kg.    Consultants: orthopedics.  Procedures performed: amputation of the first ray of the left foot.  Disposition: Long term care facility Diet recommendation:  Discharge Diet Orders (From  admission, onward)     Start     Ordered   04/02/23 0000  Diet - low sodium heart healthy        04/02/23 0912           Carb modified diet DISCHARGE MEDICATION: Allergies as of 04/02/2023   No Known Allergies      Medication List     STOP taking these medications    clindamycin 300 MG capsule Commonly known as: CLEOCIN   glipiZIDE 5 MG 24 hr tablet Commonly known as: GLUCOTROL XL   metFORMIN 500 MG tablet Commonly known as: GLUCOPHAGE       TAKE these medications    acetaminophen 325 MG tablet Commonly known as: TYLENOL Take 650 mg by mouth every 6 (six) hours as needed.   albuterol 108 (90 Base) MCG/ACT inhaler Commonly known as: VENTOLIN HFA Inhale 2 puffs into the lungs every 6 (six) hours as needed for wheezing or shortness of breath.   amLODipine 5 MG tablet Commonly known as: NORVASC Take 1 tablet (5 mg total) by mouth daily.   ascorbic acid 500 MG tablet Commonly known as: VITAMIN C Take 500 mg by mouth daily.   atorvastatin 40 MG tablet Commonly known as: LIPITOR Take 40 mg by mouth daily.   benztropine 1 MG tablet Commonly known as: COGENTIN Take 1 mg by mouth 2 (two) times daily.   cetirizine 10 MG chewable tablet Commonly known as: ZYRTEC Chew 10 mg by mouth daily.   Daily-Vite Tabs Take 1 tablet by  mouth daily.   divalproex 500 MG DR tablet Commonly known as: DEPAKOTE Take 500-1,000 mg by mouth 2 (two) times daily. Take 1 tab am and 2 tabs pm   DropSafe Safety Pen Needles 31G X 6 MM Misc Generic drug: Insulin Pen Needle USE FOR ONCE DAILY LANTUS ADMINISTRATION.   BD AutoShield Duo 30G X 5 MM Misc Generic drug: Insulin Pen Needle USE FOR ONCE DAILY LANTUS ADMINISTRATION.   EasyMax Test test strip Generic drug: glucose blood USE AS DIRECTED TO CHECK BLOOD SUGAR TWICE DAILY.   furosemide 20 MG tablet Commonly known as: LASIX Take 1 tablet (20 mg total) by mouth daily.   gabapentin 100 MG capsule Commonly known as:  NEURONTIN Take 100 mg by mouth at bedtime.   ipratropium-albuterol 0.5-2.5 (3) MG/3ML Soln Commonly known as: DUONEB Take 3 mLs by nebulization every 4 (four) hours as needed (wheezing, shortness of breath).   Lantus SoloStar 100 UNIT/ML Solostar Pen Generic drug: insulin glargine INJECT 60 UNITS SUBCUTANEOUSLY AT BEDTIME. (HOLD IF BS BELOW 60:CALL MD IF BS ABOVE 400)   lithium carbonate 300 MG capsule Take 300 mg by mouth in the morning and at bedtime.   loratadine 10 MG tablet Commonly known as: CLARITIN Take 10 mg by mouth daily.   Melatonin 3 MG Subl Place 3 mg under the tongue at bedtime.   metoprolol tartrate 25 MG tablet Commonly known as: LOPRESSOR Take 0.5 tablets (12.5 mg total) by mouth 2 (two) times daily. What changed: how much to take   nutrition supplement (JUVEN) Pack Take 1 packet by mouth 2 (two) times daily between meals.   nystatin powder Apply 1 Application topically 3 (three) times daily. Apply to groin area three times daily   risperiDONE 2 MG tablet Commonly known as: RISPERDAL Take 1 tablet (2 mg total) by mouth daily.   silver sulfADIAZINE 1 % cream Commonly known as: SILVADENE Apply 1 Application topically daily.   Spiriva Respimat 2.5 MCG/ACT Aers Generic drug: Tiotropium Bromide Monohydrate Inhale 2 puffs into the lungs daily.   tamsulosin 0.4 MG Caps capsule Commonly known as: FLOMAX Take 0.4 mg by mouth daily after supper.   traZODone 50 MG tablet Commonly known as: DESYREL Take 50 mg by mouth at bedtime.   Zinc Sulfate 220 (50 Zn) MG Tabs Take 1 tablet by mouth daily.               Discharge Care Instructions  (From admission, onward)           Start     Ordered   03/30/23 0000  Touch down weight bearing       Question Answer Comment  Laterality left   Extremity Lower      03/30/23 1017            Follow-up Information     [name], MD Follow up in 2 week(s).   Specialty: Orthopedic  Surgery Contact information: [address] [place] [address] [phone]                Discharge Exam: Scott [name] Weights   03/25/23 1628 03/27/23 0100 03/29/23 1320  Weight: 95.4 kg 95.1 kg 99.8 kg   General exam: Appears calm and comfortable  Respiratory system: Clear to auscultation. Respiratory effort normal. Cardiovascular system: S1 & S2 heard, RRR.  Gastrointestinal system: Abdomen is nondistended, soft and nontender. Central nervous system: Alert and oriented. No focal neurological deficits. Extremities: Symmetric 5 x 5 power. Skin: No rashes, Psychiatry: Mood &  affect appropriate.    Condition at discharge: fair  The results of significant diagnostics from this hospitalization (including imaging, microbiology, ancillary and laboratory) are listed below for reference.   Imaging Studies: US ARTERIAL ABI (SCREENING LOWER EXTREMITY) Result Date: 03/26/2023 CLINICAL DATA:  Left foot osteomyelitis EXAM: NONINVASIVE PHYSIOLOGIC VASCULAR STUDY OF BILATERAL LOWER EXTREMITIES TECHNIQUE: Evaluation of both lower extremities were performed at rest, including calculation of ankle-brachial indices with single level Doppler, pressure and pulse volume recording. COMPARISON:  None Available. FINDINGS: Right ABI:  1.09 Left ABI:  1.03 Right Lower Extremity:  Normal arterial waveforms at the ankle. Left Lower Extremity:  Normal arterial waveforms at the ankle. 1.0-1.4 Normal IMPRESSION: Normal bilateral resting ankle-brachial indices. Signed, Sterling Big, MD, RPVI [facility] Radiology Electronically Signed   By: Malachy Moan M.D.   On: 03/26/2023 11:26   MR FOOT LEFT W WO CONTRAST Result Date: 03/25/2023 CLINICAL DATA:  Left foot soft tissue infection. Concern for osteomyelitis. EXAM: MRI OF THE LEFT FOREFOOT WITHOUT AND WITH CONTRAST TECHNIQUE: Multiplanar, multisequence MR imaging of the left foot was performed both  before and after administration of intravenous contrast. CONTRAST:  10mL GADAVIST GADOBUTROL 1 MMOL/ML IV SOLN COMPARISON:  Same-day radiographs of the left foot at 11:29 a.m. FINDINGS: Bones/Joint/Cartilage The medial hallux sesamoid demonstrates marrow signal abnormality with corresponding enhancement on postcontrast sequences, consistent with osteomyelitis. Degenerative changes of the first interphalangeal joint with joint space narrowing, marginal osteophytosis, and regions of cortical irregularity. There is T2 hyperintense marrow edema with corresponding T1 hypointense marrow signal abnormality and enhancement on postcontrast imaging sequences involving the first distal phalanx, most pronounced proximally at the level of the first interphalangeal joint (series 6, image 27 and series 11, image 27). Mild degenerative changes at the first MTP joint. Subchondral cystic change of the talus at the talonavicular articulation. Ligaments Collateral ligaments are intact.  Lisfranc ligament is intact. Muscles and Tendons Flexor, peroneal and extensor compartment tendons are intact. No evidence of tenosynovitis. Muscles are normal. Soft tissue Soft tissue ulceration at the plantar base of the first metatarsal head, overlying the medial hallux sesamoid. There is surrounding soft tissue edema and enhancement. No rim enhancing collection identified. IMPRESSION: 1. Osteomyelitis of the medial hallux sesamoid with overlying plantar soft tissue ulceration and associated cellulitis. No fluid collection identified. 2. Marrow signal abnormality with corresponding enhancement involving the first distal phalanx, most pronounced proximally at the level of the first interphalangeal joint. These findings are concerning for osteomyelitis/septic arthritis. Electronically Signed   By: Hart Robinsons M.D.   On: 03/25/2023 14:53   DG Foot 2 Views Left Result Date: 03/25/2023 CLINICAL DATA:  Nonhealing plantar foot ulcer. EXAM: LEFT  FOOT - 2 VIEW COMPARISON:  Left foot x-rays dated March 07, 2023. FINDINGS: Soft tissue ulceration at the plantar base of the first metatarsal head appears slightly increased in size compared to prior study. There is new cortical indistinctness of the underlying medial hallux sesamoid. No acute fracture or dislocation. Similar first MTP joint degenerative changes. Osteopenia. IMPRESSION: 1. Soft tissue ulceration at the plantar base of the first metatarsal head with new cortical indistinctness of the underlying medial hallux sesamoid, concerning for osteomyelitis. Electronically Signed   By: Obie Dredge M.D.   On: 03/25/2023 11:53   CT Angio Chest PE W/Cm &/Or Wo Cm Result Date: 03/08/2023 CLINICAL DATA:  Cough with elevated D-dimer levels. Clinical concern for pulmonary embolism. EXAM: CT ANGIOGRAPHY CHEST WITH CONTRAST TECHNIQUE: Multidetector CT imaging  of the chest was performed using the standard protocol during bolus administration of intravenous contrast. Multiplanar CT image reconstructions and MIPs were obtained to evaluate the vascular anatomy. RADIATION DOSE REDUCTION: This exam was performed according to the departmental dose-optimization program which includes automated exposure control, adjustment of the mA and/or kV according to patient size and/or use of iterative reconstruction technique. CONTRAST:  OMNIPAQUE IOHEXOL 350 MG/ML SOLN COMPARISON:  Radiographs 03/07/2023. Noncontrast chest CT 02/12/2023 and chest CTA 11/22/2019. FINDINGS: Cardiovascular: The pulmonary arteries are satisfactorily, but not optimally, opacified with contrast to the level of the segmental branches. There is no evidence of acute pulmonary embolism. Mild aortic and coronary artery atherosclerosis. The heart size is normal. There is no pericardial effusion. Mediastinum/Nodes: There are no enlarged mediastinal, hilar or axillary lymph nodes. The thyroid gland, trachea and esophagus demonstrate no significant  findings. Lungs/Pleura: No pleural effusion or pneumothorax. Mild paraseptal emphysema again noted. The left lower lobe airspace disease seen on the previous study has resolved. There is mild bibasilar atelectasis, but no residual confluent airspace disease or suspicious nodularity. Mild central airway thickening noted. Upper abdomen: The visualized upper abdomen appears stable, without significant findings. Musculoskeletal/Chest wall: There is no chest wall mass or suspicious osseous finding. Review of the MIP images confirms the above findings. IMPRESSION: 1. No evidence of acute pulmonary embolism or other acute chest findings. 2. Interval resolution of left lower lobe airspace disease seen on previous study. Mild bibasilar atelectasis. 3. Mild central airway thickening suggesting bronchitis. 4. Aortic Atherosclerosis (ICD10-I70.0) and Emphysema (ICD10-J43.9). Electronically Signed   By: Carey Bullocks M.D.   On: 03/08/2023 09:34   DG Tibia/Fibula Left Result Date: 03/07/2023 CLINICAL DATA:  Left leg swelling/cellulitis. EXAM: LEFT TIBIA AND FIBULA - 2 VIEW COMPARISON:  None Available. FINDINGS: No fracture.  No bone lesion. No bone resorption to suggest osteomyelitis. Previous proximal tibia ORIF with a lateral fixation plate and multiple screws, well positioned and well-seated. Narrowing of the knee joint with marginal osteophytes consistent with osteoarthritis. Ankle joint normally spaced and aligned. Diffuse subcutaneous soft tissue edema.  No soft tissue air. IMPRESSION: 1. No fracture or acute bone abnormality. No evidence of osteomyelitis. 2. Diffuse subcutaneous soft tissue edema consistent with cellulitis. No soft tissue air. Electronically Signed   By: Amie Portland M.D.   On: 03/07/2023 13:25   DG Foot Complete Left Result Date: 03/07/2023 CLINICAL DATA:  Ulcer along the bottom of the foot. EXAM: LEFT FOOT - COMPLETE 3+ VIEW COMPARISON:  01/14/2023. FINDINGS: No fracture or bone lesion. Also  projects over the plantar forefoot near the first metatarsophalangeal joint. There is no bone resorption to suggest osteomyelitis. Joints are normally aligned. IMPRESSION: 1. No fracture, bone lesion or evidence of osteomyelitis. 2. Soft tissue ulceration. Electronically Signed   By: Amie Portland M.D.   On: 03/07/2023 13:24   DG Chest Port 1 View Result Date: 03/07/2023 CLINICAL DATA:  Altered mental status EXAM: PORTABLE CHEST 1 VIEW COMPARISON:  02/12/2023 FINDINGS: The heart size and mediastinal contours are stable. Streaky bibasilar opacities, right greater than left. No pleural effusion or pneumothorax. The visualized skeletal structures are unremarkable. IMPRESSION: Streaky bibasilar opacities, right greater than left, favored to represent atelectasis. Electronically Signed   By: Duanne Guess D.O.   On: 03/07/2023 12:58    Microbiology: Results for orders placed or performed during the hospital encounter of 03/25/23  Culture, blood (Routine X 2) w Reflex to ID Panel     Status: None  Collection Time: 03/25/23  6:58 PM   Specimen: BLOOD  Result Value Ref Range Status   Specimen Description BLOOD RIGHT ANTECUBITAL  Final   Special Requests   Final    BOTTLES DRAWN AEROBIC AND ANAEROBIC Blood Culture adequate volume   Culture   Final    NO GROWTH 5 DAYS Performed at [facility], [address], [place], [place] [address]    Report Status 03/30/2023 FINAL  Final  Culture, blood (Routine X 2) w Reflex to ID Panel     Status: None   Collection Time: 03/25/23  7:03 PM   Specimen: BLOOD  Result Value Ref Range Status   Specimen Description BLOOD LEFT ANTECUBITAL  Final   Special Requests   Final    BOTTLES DRAWN AEROBIC AND ANAEROBIC Blood Culture adequate volume   Culture   Final    NO GROWTH 5 DAYS Performed at [facility], [address], [place], [place] [address]    Report Status 03/30/2023 FINAL  Final  Surgical pcr screen     Status: Abnormal   Collection Time: 03/28/23  10:08 PM   Specimen: Nasal Mucosa; Nasal Swab  Result Value Ref Range Status   MRSA, PCR NEGATIVE NEGATIVE Final   Staphylococcus aureus POSITIVE (A) NEGATIVE Final    Comment: (NOTE) The Xpert SA Assay (FDA approved for NASAL specimens in patients 79 years of age and older), is one component of a comprehensive surveillance program. It is not intended to diagnose infection nor to guide or monitor treatment. Performed at [facility], [address], [place], [place] [address]     Labs: CBC: No results for input(s): "WBC", "NEUTROABS", "HGB", "HCT", "MCV", "PLT" in the last 168 hours. Basic Metabolic Panel: Recent Labs  Lab 03/28/23 0635  NA 139  K 4.6  CL 110  CO2 23  GLUCOSE 170*  BUN 23  CREATININE 1.64*  CALCIUM 8.7*   Liver Function Tests: No results for input(s): "AST", "ALT", "ALKPHOS", "BILITOT", "PROT", "ALBUMIN" in the last 168 hours. CBG: Recent Labs  Lab 04/01/23 0800 04/01/23 1135 04/01/23 1627 04/01/23 2000 04/02/23 0847  GLUCAP 141* 171* 218* 318* 169*    Discharge time spent: 40 minutes.   Signed: Kathlen Mody, MD [facility] 04/02/2023

## 2023-04-03 ENCOUNTER — Other Ambulatory Visit: Payer: Self-pay

## 2023-04-03 ENCOUNTER — Encounter (HOSPITAL_COMMUNITY): Payer: Self-pay

## 2023-04-03 ENCOUNTER — Emergency Department (HOSPITAL_COMMUNITY)
Admission: EM | Admit: 2023-04-03 | Discharge: 2023-04-03 | Disposition: A | Discharge status: Home / Self Care | Payer: Medicare Other | Attending: Emergency Medicine | Admitting: Emergency Medicine

## 2023-04-03 DIAGNOSIS — Z743 Need for continuous supervision: Secondary | ICD-10-CM | POA: Diagnosis not present

## 2023-04-03 DIAGNOSIS — T8131XA Disruption of external operation (surgical) wound, not elsewhere classified, initial encounter: Secondary | ICD-10-CM | POA: Insufficient documentation

## 2023-04-03 DIAGNOSIS — Z4800 Encounter for change or removal of nonsurgical wound dressing: Secondary | ICD-10-CM | POA: Insufficient documentation

## 2023-04-03 DIAGNOSIS — Z4801 Encounter for change or removal of surgical wound dressing: Secondary | ICD-10-CM | POA: Diagnosis not present

## 2023-04-03 DIAGNOSIS — Z794 Long term (current) use of insulin: Secondary | ICD-10-CM | POA: Diagnosis not present

## 2023-04-03 DIAGNOSIS — Z5189 Encounter for other specified aftercare: Secondary | ICD-10-CM

## 2023-04-03 DIAGNOSIS — R58 Hemorrhage, not elsewhere classified: Secondary | ICD-10-CM | POA: Diagnosis not present

## 2023-04-03 DIAGNOSIS — T8130XA Disruption of wound, unspecified, initial encounter: Secondary | ICD-10-CM

## 2023-04-03 DIAGNOSIS — R69 Illness, unspecified: Secondary | ICD-10-CM | POA: Diagnosis not present

## 2023-04-03 NOTE — ED Notes (Signed)
Patient being picked up for transport back to [facility] term care.

## 2023-04-03 NOTE — ED Provider Notes (Signed)
[name] Provider Note   CSN: [id number] Arrival date & time: 04/03/23  1605     History  Chief Complaint  Patient presents with   Wound Check    Scott [name] is a 62 y.o. male.  HPI     62 year old male with history of bipolar disorder, recent diagnosis of osteomyelitis status post surgical debridement postop day #2, after being discharged from the hospital yesterday comes into the emergency room with chief complaint of bleeding from the wound site.  Patient is currently residing in a nursing home.  According to the patient, he tried to walk and started bleeding.  He is unsure what ambulatory recommendations have been given to him.  He has no pain at this time.  He is not on any blood thinners.  Patient is taking antibiotics.  Home Medications Prior to Admission medications   Medication Sig Start Date End Date Taking? Authorizing Provider  acetaminophen (TYLENOL) 325 MG tablet Take 650 mg by mouth every 6 (six) hours as needed.    [provider]  albuterol (VENTOLIN HFA) 108 (90 Base) MCG/ACT inhaler Inhale 2 puffs into the lungs every 6 (six) hours as needed for wheezing or shortness of breath.    [provider]  amLODipine (NORVASC) 5 MG tablet Take 1 tablet (5 mg total) by mouth daily. 06/15/19   Erick Blinks, MD  ascorbic acid (VITAMIN C) 500 MG tablet Take 500 mg by mouth daily.  04/07/23  [provider]  atorvastatin (LIPITOR) 40 MG tablet Take 40 mg by mouth daily. 07/10/22   [provider]  benztropine (COGENTIN) 1 MG tablet Take 1 mg by mouth 2 (two) times daily.  11/05/18   [provider]  cetirizine (ZYRTEC) 10 MG chewable tablet Chew 10 mg by mouth daily.    [provider]  divalproex (DEPAKOTE) 500 MG DR tablet Take 500-1,000 mg by mouth 2 (two) times daily. Take 1 tab am and 2 tabs pm    [provider]  DROPSAFE SAFETY PEN NEEDLES 31G X 6 MM MISC USE  FOR ONCE DAILY LANTUS ADMINISTRATION. 03/27/22   Dani Gobble, NP  furosemide (LASIX) 20 MG tablet Take 1 tablet (20 mg total) by mouth daily. 04/02/23   Kathlen Mody, MD  gabapentin (NEURONTIN) 100 MG capsule Take 100 mg by mouth at bedtime.    [provider]  glucose blood (EASYMAX TEST) test strip USE AS DIRECTED TO CHECK BLOOD SUGAR TWICE DAILY. 09/24/22   Dani Gobble, NP  Insulin Pen Needle (BD AUTOSHIELD DUO) 30G X 5 MM MISC USE FOR ONCE DAILY LANTUS ADMINISTRATION. 07/17/22   Dani Gobble, NP  ipratropium-albuterol (DUONEB) 0.5-2.5 (3) MG/3ML SOLN Take 3 mLs by nebulization every 4 (four) hours as needed (wheezing, shortness of breath). 04/02/23   Kathlen Mody, MD  LANTUS SOLOSTAR 100 UNIT/ML Solostar Pen INJECT 60 UNITS SUBCUTANEOUSLY AT BEDTIME. (HOLD IF BS BELOW 60:CALL MD IF BS ABOVE 400) 12/24/22   Dani Gobble, NP  lithium carbonate 300 MG capsule Take 300 mg by mouth in the morning and at bedtime.    [provider]  loratadine (CLARITIN) 10 MG tablet Take 10 mg by mouth daily.    [provider]  Melatonin 3 MG SUBL Place 3 mg under the tongue at bedtime.    [provider]  metoprolol tartrate (LOPRESSOR) 25 MG tablet Take 0.5 tablets (12.5 mg total) by mouth 2 (two) times daily.  Patient taking differently: Take 25 mg by mouth 2 (two) times daily. 05/03/15   [name], Ellin Goodie, MD  Multiple Vitamin (DAILY-VITE) TABS Take 1 tablet by mouth daily. 03/04/23   [provider]  nutrition supplement, JUVEN, (JUVEN) PACK Take 1 packet by mouth 2 (two) times daily between meals. 04/02/23 07/01/23  Kathlen Mody, MD  nystatin powder Apply 1 Application topically 3 (three) times daily. Apply to groin area three times daily    [provider]  risperiDONE (RISPERDAL) 2 MG tablet Take 1 tablet (2 mg total) by mouth daily. 02/15/23   Johnson, Clanford L, MD  silver sulfADIAZINE (SILVADENE) 1 % cream Apply 1 Application  topically daily.    [provider]  SPIRIVA RESPIMAT 2.5 MCG/ACT AERS Inhale 2 puffs into the lungs daily. 03/04/23   [provider]  tamsulosin (FLOMAX) 0.4 MG CAPS capsule Take 0.4 mg by mouth daily after supper.    [provider]  traZODone (DESYREL) 50 MG tablet Take 50 mg by mouth at bedtime.    [provider]  Zinc Sulfate 220 (50 Zn) MG TABS Take 1 tablet by mouth daily. 03/04/23   [provider]      Allergies    Patient has no known allergies.    Review of Systems   Review of Systems  All other systems reviewed and are negative.   Physical Exam Updated Vital Signs BP 125/82 (BP Location: Left Arm)   Pulse 75   Temp 98.7 F (37.1 C) (Oral)   Resp 11   Ht 6\' 2"  (1.88 m)   Wt 99.8 kg   SpO2 92%   BMI 28.25 kg/m  Physical Exam Vitals and nursing note reviewed.  Constitutional:      Appearance: He is well-developed.  HENT:     Head: Atraumatic.  Cardiovascular:     Rate and Rhythm: Normal rate.  Pulmonary:     Effort: Pulmonary effort is normal.  Musculoskeletal:     Cervical back: Neck supple.     Comments: Bleeding is stopped with compressive dressing.  Patient has slight exposure of subcutaneous tissue on gross evaluation, possibly mild wound dehiscence  Skin:    General: Skin is warm.  Neurological:     Mental Status: He is alert and oriented to person, place, and time.          ED Results / Procedures / Treatments   Labs (all labs ordered are listed, but only abnormal results are displayed) Labs Reviewed - No data to display  EKG None  Radiology No results found.  Procedures Procedures    Medications Ordered in ED Medications - No data to display  ED Course/ Medical Decision Making/ A&P                                 Medical Decision Making  62 year old male comes in with chief complaint of wound evaluation.  He has history of osteomyelitis status post surgical debridement 2 days  ago and discharged from the hospital 1 day ago.  I reviewed patient's records.  It appears that the PT note indicated that patient should not be putting significant weight on the affected extremity.  It appears that patient did try to walk, which led to bleeding.  Based on exam, differential diagnosis includes wound dehiscence, venous bleeding, arterial bleeding, hematoma.  Exam appears to be consistent with likely a small wound dehiscence.  It is  possible that patient might have had a clot or hematoma that might have also disrupted leading to bleeding.  The bleeding is stopped at this time.  We will apply quick clot gauze and not on the use of dressing.  We will recommend that the nursing home proceed with the wound care precautions/recommendations given at the time of discharge from the hospital starting tomorrow.  As a courtesy, I will send information regarding this encounter to Dr. Lajoyce Corners.  It appears that patient is going to have a follow-up in 2 weeks.  Final Clinical Impression(s) / ED Diagnoses Final diagnoses:  Wound dehiscence  Visit for wound check    Rx / DC Orders ED Discharge Orders     None         Derwood Kaplan, MD 04/03/23 1737

## 2023-04-03 NOTE — Discharge Instructions (Signed)
Mr. Scott [name] was seen in the ER because of bleeding from the wound.  It appears that he might have a small wound dehiscence.  He informs me that the bleeding was a result of him trying to walk.  If that is the case, he will need to stay bedbound and have the physical therapist at your facility look at the recommendations from the surgery and advised on mobility.  Our workup indicates that patient should be not putting significant weight on the affected extremity.  He will also need wound care to assess the wound.  We have applied a quick clot gauze and Vaseline gauze on the wound right now.  The wound needs no assessment for at least 24 hours.  Thereafter, you may continue with wound care as recommended at the time of discharge from the hospital.

## 2023-04-03 NOTE — ED Triage Notes (Signed)
Pt BIB ems for bleeding from his left big toe removal. Pt was just recently at [facility] and had his big toe removed. Pt just got back to nursing facility yesterday and was sent back today because of the amount of drainage and bleeding from dressing. Per EMS dressing has not been changed since pt has been back to facility. Pt denies any pain at this time.

## 2023-04-04 DIAGNOSIS — L97522 Non-pressure chronic ulcer of other part of left foot with fat layer exposed: Secondary | ICD-10-CM | POA: Diagnosis not present

## 2023-04-04 DIAGNOSIS — E1122 Type 2 diabetes mellitus with diabetic chronic kidney disease: Secondary | ICD-10-CM | POA: Diagnosis not present

## 2023-04-04 DIAGNOSIS — E1165 Type 2 diabetes mellitus with hyperglycemia: Secondary | ICD-10-CM | POA: Diagnosis not present

## 2023-04-04 DIAGNOSIS — L03116 Cellulitis of left lower limb: Secondary | ICD-10-CM | POA: Diagnosis not present

## 2023-04-04 DIAGNOSIS — I129 Hypertensive chronic kidney disease with stage 1 through stage 4 chronic kidney disease, or unspecified chronic kidney disease: Secondary | ICD-10-CM | POA: Diagnosis not present

## 2023-04-04 DIAGNOSIS — E11621 Type 2 diabetes mellitus with foot ulcer: Secondary | ICD-10-CM | POA: Diagnosis not present

## 2023-04-07 DIAGNOSIS — L03116 Cellulitis of left lower limb: Secondary | ICD-10-CM | POA: Diagnosis not present

## 2023-04-07 DIAGNOSIS — I129 Hypertensive chronic kidney disease with stage 1 through stage 4 chronic kidney disease, or unspecified chronic kidney disease: Secondary | ICD-10-CM | POA: Diagnosis not present

## 2023-04-07 DIAGNOSIS — E1122 Type 2 diabetes mellitus with diabetic chronic kidney disease: Secondary | ICD-10-CM | POA: Diagnosis not present

## 2023-04-07 DIAGNOSIS — L97522 Non-pressure chronic ulcer of other part of left foot with fat layer exposed: Secondary | ICD-10-CM | POA: Diagnosis not present

## 2023-04-07 DIAGNOSIS — E11621 Type 2 diabetes mellitus with foot ulcer: Secondary | ICD-10-CM | POA: Diagnosis not present

## 2023-04-07 DIAGNOSIS — E1165 Type 2 diabetes mellitus with hyperglycemia: Secondary | ICD-10-CM | POA: Diagnosis not present

## 2023-04-09 ENCOUNTER — Other Ambulatory Visit: Payer: Self-pay | Admitting: Nurse Practitioner

## 2023-04-09 DIAGNOSIS — L03116 Cellulitis of left lower limb: Secondary | ICD-10-CM | POA: Diagnosis not present

## 2023-04-09 DIAGNOSIS — E1122 Type 2 diabetes mellitus with diabetic chronic kidney disease: Secondary | ICD-10-CM | POA: Diagnosis not present

## 2023-04-09 DIAGNOSIS — L97522 Non-pressure chronic ulcer of other part of left foot with fat layer exposed: Secondary | ICD-10-CM | POA: Diagnosis not present

## 2023-04-09 DIAGNOSIS — E11621 Type 2 diabetes mellitus with foot ulcer: Secondary | ICD-10-CM | POA: Diagnosis not present

## 2023-04-09 DIAGNOSIS — E1165 Type 2 diabetes mellitus with hyperglycemia: Secondary | ICD-10-CM | POA: Diagnosis not present

## 2023-04-09 DIAGNOSIS — I129 Hypertensive chronic kidney disease with stage 1 through stage 4 chronic kidney disease, or unspecified chronic kidney disease: Secondary | ICD-10-CM | POA: Diagnosis not present

## 2023-04-11 ENCOUNTER — Telehealth: Payer: Self-pay | Admitting: Orthopedic Surgery

## 2023-04-11 ENCOUNTER — Encounter: Payer: Self-pay | Admitting: Orthopedic Surgery

## 2023-04-11 ENCOUNTER — Ambulatory Visit: Payer: Medicare Other | Admitting: Orthopedic Surgery

## 2023-04-11 DIAGNOSIS — E1122 Type 2 diabetes mellitus with diabetic chronic kidney disease: Secondary | ICD-10-CM | POA: Diagnosis not present

## 2023-04-11 DIAGNOSIS — Z89412 Acquired absence of left great toe: Secondary | ICD-10-CM

## 2023-04-11 DIAGNOSIS — E11621 Type 2 diabetes mellitus with foot ulcer: Secondary | ICD-10-CM | POA: Diagnosis not present

## 2023-04-11 DIAGNOSIS — L97522 Non-pressure chronic ulcer of other part of left foot with fat layer exposed: Secondary | ICD-10-CM | POA: Diagnosis not present

## 2023-04-11 DIAGNOSIS — E1165 Type 2 diabetes mellitus with hyperglycemia: Secondary | ICD-10-CM | POA: Diagnosis not present

## 2023-04-11 DIAGNOSIS — L03116 Cellulitis of left lower limb: Secondary | ICD-10-CM | POA: Diagnosis not present

## 2023-04-11 DIAGNOSIS — I129 Hypertensive chronic kidney disease with stage 1 through stage 4 chronic kidney disease, or unspecified chronic kidney disease: Secondary | ICD-10-CM | POA: Diagnosis not present
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
## 2023-04-11 NOTE — Telephone Encounter (Signed)
 Can you please dictate this note from today so that I can send to guardian please?

## 2023-04-11 NOTE — Telephone Encounter (Signed)
 Pt legal guardian Scott [name] would like OV notes faxed over to her at [phone]

## 2023-04-12 DIAGNOSIS — L97522 Non-pressure chronic ulcer of other part of left foot with fat layer exposed: Secondary | ICD-10-CM | POA: Diagnosis not present

## 2023-04-12 DIAGNOSIS — E1165 Type 2 diabetes mellitus with hyperglycemia: Secondary | ICD-10-CM | POA: Diagnosis not present

## 2023-04-12 DIAGNOSIS — E11621 Type 2 diabetes mellitus with foot ulcer: Secondary | ICD-10-CM | POA: Diagnosis not present

## 2023-04-12 DIAGNOSIS — L03116 Cellulitis of left lower limb: Secondary | ICD-10-CM | POA: Diagnosis not present

## 2023-04-12 DIAGNOSIS — E1122 Type 2 diabetes mellitus with diabetic chronic kidney disease: Secondary | ICD-10-CM | POA: Diagnosis not present

## 2023-04-12 DIAGNOSIS — I129 Hypertensive chronic kidney disease with stage 1 through stage 4 chronic kidney disease, or unspecified chronic kidney disease: Secondary | ICD-10-CM | POA: Diagnosis not present

## 2023-04-12 NOTE — Telephone Encounter (Signed)
 Faxed note from yesterday as requested.

## 2023-04-13 DIAGNOSIS — E1165 Type 2 diabetes mellitus with hyperglycemia: Secondary | ICD-10-CM | POA: Diagnosis not present

## 2023-04-13 DIAGNOSIS — E1122 Type 2 diabetes mellitus with diabetic chronic kidney disease: Secondary | ICD-10-CM | POA: Diagnosis not present

## 2023-04-13 DIAGNOSIS — E11621 Type 2 diabetes mellitus with foot ulcer: Secondary | ICD-10-CM | POA: Diagnosis not present

## 2023-04-13 DIAGNOSIS — Z7984 Long term (current) use of oral hypoglycemic drugs: Secondary | ICD-10-CM | POA: Diagnosis not present

## 2023-04-13 DIAGNOSIS — I129 Hypertensive chronic kidney disease with stage 1 through stage 4 chronic kidney disease, or unspecified chronic kidney disease: Secondary | ICD-10-CM | POA: Diagnosis not present

## 2023-04-13 DIAGNOSIS — Z794 Long term (current) use of insulin: Secondary | ICD-10-CM | POA: Diagnosis not present

## 2023-04-13 DIAGNOSIS — J449 Chronic obstructive pulmonary disease, unspecified: Secondary | ICD-10-CM | POA: Diagnosis not present

## 2023-04-13 DIAGNOSIS — N189 Chronic kidney disease, unspecified: Secondary | ICD-10-CM | POA: Diagnosis not present

## 2023-04-13 DIAGNOSIS — Z9181 History of falling: Secondary | ICD-10-CM | POA: Diagnosis not present

## 2023-04-13 DIAGNOSIS — L03116 Cellulitis of left lower limb: Secondary | ICD-10-CM | POA: Diagnosis not present

## 2023-04-13 DIAGNOSIS — L97522 Non-pressure chronic ulcer of other part of left foot with fat layer exposed: Secondary | ICD-10-CM | POA: Diagnosis not present

## 2023-04-13 DIAGNOSIS — F319 Bipolar disorder, unspecified: Secondary | ICD-10-CM | POA: Diagnosis not present

## 2023-04-15 DIAGNOSIS — E1122 Type 2 diabetes mellitus with diabetic chronic kidney disease: Secondary | ICD-10-CM | POA: Diagnosis not present

## 2023-04-15 DIAGNOSIS — I129 Hypertensive chronic kidney disease with stage 1 through stage 4 chronic kidney disease, or unspecified chronic kidney disease: Secondary | ICD-10-CM | POA: Diagnosis not present

## 2023-04-15 DIAGNOSIS — L97522 Non-pressure chronic ulcer of other part of left foot with fat layer exposed: Secondary | ICD-10-CM | POA: Diagnosis not present

## 2023-04-15 DIAGNOSIS — E1165 Type 2 diabetes mellitus with hyperglycemia: Secondary | ICD-10-CM | POA: Diagnosis not present

## 2023-04-15 DIAGNOSIS — E11621 Type 2 diabetes mellitus with foot ulcer: Secondary | ICD-10-CM | POA: Diagnosis not present

## 2023-04-15 DIAGNOSIS — L03116 Cellulitis of left lower limb: Secondary | ICD-10-CM | POA: Diagnosis not present

## 2023-04-18 DIAGNOSIS — L97522 Non-pressure chronic ulcer of other part of left foot with fat layer exposed: Secondary | ICD-10-CM | POA: Diagnosis not present

## 2023-04-18 DIAGNOSIS — L03116 Cellulitis of left lower limb: Secondary | ICD-10-CM | POA: Diagnosis not present

## 2023-04-18 DIAGNOSIS — I129 Hypertensive chronic kidney disease with stage 1 through stage 4 chronic kidney disease, or unspecified chronic kidney disease: Secondary | ICD-10-CM | POA: Diagnosis not present

## 2023-04-18 DIAGNOSIS — E11621 Type 2 diabetes mellitus with foot ulcer: Secondary | ICD-10-CM | POA: Diagnosis not present

## 2023-04-18 DIAGNOSIS — E1165 Type 2 diabetes mellitus with hyperglycemia: Secondary | ICD-10-CM | POA: Diagnosis not present

## 2023-04-18 DIAGNOSIS — E1122 Type 2 diabetes mellitus with diabetic chronic kidney disease: Secondary | ICD-10-CM | POA: Diagnosis not present

## 2023-04-21 DIAGNOSIS — I10 Essential (primary) hypertension: Secondary | ICD-10-CM | POA: Diagnosis not present

## 2023-04-21 DIAGNOSIS — E1142 Type 2 diabetes mellitus with diabetic polyneuropathy: Secondary | ICD-10-CM | POA: Diagnosis not present

## 2023-04-22 DIAGNOSIS — E1165 Type 2 diabetes mellitus with hyperglycemia: Secondary | ICD-10-CM | POA: Diagnosis not present

## 2023-04-22 DIAGNOSIS — L97522 Non-pressure chronic ulcer of other part of left foot with fat layer exposed: Secondary | ICD-10-CM | POA: Diagnosis not present

## 2023-04-22 DIAGNOSIS — I129 Hypertensive chronic kidney disease with stage 1 through stage 4 chronic kidney disease, or unspecified chronic kidney disease: Secondary | ICD-10-CM | POA: Diagnosis not present

## 2023-04-22 DIAGNOSIS — L03116 Cellulitis of left lower limb: Secondary | ICD-10-CM | POA: Diagnosis not present

## 2023-04-22 DIAGNOSIS — E11621 Type 2 diabetes mellitus with foot ulcer: Secondary | ICD-10-CM | POA: Diagnosis not present

## 2023-04-22 DIAGNOSIS — E1122 Type 2 diabetes mellitus with diabetic chronic kidney disease: Secondary | ICD-10-CM | POA: Diagnosis not present

## 2023-04-23 DIAGNOSIS — E1122 Type 2 diabetes mellitus with diabetic chronic kidney disease: Secondary | ICD-10-CM | POA: Diagnosis not present

## 2023-04-23 DIAGNOSIS — I129 Hypertensive chronic kidney disease with stage 1 through stage 4 chronic kidney disease, or unspecified chronic kidney disease: Secondary | ICD-10-CM | POA: Diagnosis not present

## 2023-04-23 DIAGNOSIS — L97522 Non-pressure chronic ulcer of other part of left foot with fat layer exposed: Secondary | ICD-10-CM | POA: Diagnosis not present

## 2023-04-23 DIAGNOSIS — L03116 Cellulitis of left lower limb: Secondary | ICD-10-CM | POA: Diagnosis not present

## 2023-04-23 DIAGNOSIS — E1165 Type 2 diabetes mellitus with hyperglycemia: Secondary | ICD-10-CM | POA: Diagnosis not present

## 2023-04-23 DIAGNOSIS — E11621 Type 2 diabetes mellitus with foot ulcer: Secondary | ICD-10-CM | POA: Diagnosis not present

## 2023-04-25 ENCOUNTER — Ambulatory Visit: Payer: Medicare Other | Admitting: Orthopedic Surgery

## 2023-04-25 DIAGNOSIS — F319 Bipolar disorder, unspecified: Secondary | ICD-10-CM | POA: Diagnosis not present

## 2023-04-25 DIAGNOSIS — E1165 Type 2 diabetes mellitus with hyperglycemia: Secondary | ICD-10-CM | POA: Diagnosis not present

## 2023-04-25 DIAGNOSIS — F5101 Primary insomnia: Secondary | ICD-10-CM | POA: Diagnosis not present

## 2023-04-25 DIAGNOSIS — L03116 Cellulitis of left lower limb: Secondary | ICD-10-CM | POA: Diagnosis not present

## 2023-04-25 DIAGNOSIS — Z87820 Personal history of traumatic brain injury: Secondary | ICD-10-CM | POA: Diagnosis not present

## 2023-04-25 DIAGNOSIS — L97522 Non-pressure chronic ulcer of other part of left foot with fat layer exposed: Secondary | ICD-10-CM | POA: Diagnosis not present

## 2023-04-25 DIAGNOSIS — E1122 Type 2 diabetes mellitus with diabetic chronic kidney disease: Secondary | ICD-10-CM | POA: Diagnosis not present

## 2023-04-25 DIAGNOSIS — E11621 Type 2 diabetes mellitus with foot ulcer: Secondary | ICD-10-CM | POA: Diagnosis not present

## 2023-04-25 DIAGNOSIS — I129 Hypertensive chronic kidney disease with stage 1 through stage 4 chronic kidney disease, or unspecified chronic kidney disease: Secondary | ICD-10-CM | POA: Diagnosis not present

## 2023-04-29 DIAGNOSIS — E1165 Type 2 diabetes mellitus with hyperglycemia: Secondary | ICD-10-CM | POA: Diagnosis not present

## 2023-04-29 DIAGNOSIS — E11621 Type 2 diabetes mellitus with foot ulcer: Secondary | ICD-10-CM | POA: Diagnosis not present

## 2023-04-29 DIAGNOSIS — L03116 Cellulitis of left lower limb: Secondary | ICD-10-CM | POA: Diagnosis not present

## 2023-04-29 DIAGNOSIS — I129 Hypertensive chronic kidney disease with stage 1 through stage 4 chronic kidney disease, or unspecified chronic kidney disease: Secondary | ICD-10-CM | POA: Diagnosis not present

## 2023-04-29 DIAGNOSIS — E1122 Type 2 diabetes mellitus with diabetic chronic kidney disease: Secondary | ICD-10-CM | POA: Diagnosis not present

## 2023-04-29 DIAGNOSIS — L97522 Non-pressure chronic ulcer of other part of left foot with fat layer exposed: Secondary | ICD-10-CM | POA: Diagnosis not present

## 2023-05-02 DIAGNOSIS — E1165 Type 2 diabetes mellitus with hyperglycemia: Secondary | ICD-10-CM | POA: Diagnosis not present

## 2023-05-02 DIAGNOSIS — I129 Hypertensive chronic kidney disease with stage 1 through stage 4 chronic kidney disease, or unspecified chronic kidney disease: Secondary | ICD-10-CM | POA: Diagnosis not present

## 2023-05-02 DIAGNOSIS — L97522 Non-pressure chronic ulcer of other part of left foot with fat layer exposed: Secondary | ICD-10-CM | POA: Diagnosis not present

## 2023-05-02 DIAGNOSIS — E11621 Type 2 diabetes mellitus with foot ulcer: Secondary | ICD-10-CM | POA: Diagnosis not present

## 2023-05-02 DIAGNOSIS — E1122 Type 2 diabetes mellitus with diabetic chronic kidney disease: Secondary | ICD-10-CM | POA: Diagnosis not present

## 2023-05-02 DIAGNOSIS — L03116 Cellulitis of left lower limb: Secondary | ICD-10-CM | POA: Diagnosis not present

## 2023-05-06 DIAGNOSIS — I129 Hypertensive chronic kidney disease with stage 1 through stage 4 chronic kidney disease, or unspecified chronic kidney disease: Secondary | ICD-10-CM | POA: Diagnosis not present

## 2023-05-06 DIAGNOSIS — E11621 Type 2 diabetes mellitus with foot ulcer: Secondary | ICD-10-CM | POA: Diagnosis not present

## 2023-05-06 DIAGNOSIS — L97522 Non-pressure chronic ulcer of other part of left foot with fat layer exposed: Secondary | ICD-10-CM | POA: Diagnosis not present

## 2023-05-06 DIAGNOSIS — E1122 Type 2 diabetes mellitus with diabetic chronic kidney disease: Secondary | ICD-10-CM | POA: Diagnosis not present

## 2023-05-06 DIAGNOSIS — E1165 Type 2 diabetes mellitus with hyperglycemia: Secondary | ICD-10-CM | POA: Diagnosis not present

## 2023-05-06 DIAGNOSIS — L03116 Cellulitis of left lower limb: Secondary | ICD-10-CM | POA: Diagnosis not present

## 2023-05-07 DIAGNOSIS — L03116 Cellulitis of left lower limb: Secondary | ICD-10-CM | POA: Diagnosis not present

## 2023-05-07 DIAGNOSIS — E1165 Type 2 diabetes mellitus with hyperglycemia: Secondary | ICD-10-CM | POA: Diagnosis not present

## 2023-05-07 DIAGNOSIS — E1122 Type 2 diabetes mellitus with diabetic chronic kidney disease: Secondary | ICD-10-CM | POA: Diagnosis not present

## 2023-05-07 DIAGNOSIS — L97522 Non-pressure chronic ulcer of other part of left foot with fat layer exposed: Secondary | ICD-10-CM | POA: Diagnosis not present

## 2023-05-07 DIAGNOSIS — E11621 Type 2 diabetes mellitus with foot ulcer: Secondary | ICD-10-CM | POA: Diagnosis not present

## 2023-05-07 DIAGNOSIS — I129 Hypertensive chronic kidney disease with stage 1 through stage 4 chronic kidney disease, or unspecified chronic kidney disease: Secondary | ICD-10-CM | POA: Diagnosis not present

## 2023-05-09 DIAGNOSIS — L97522 Non-pressure chronic ulcer of other part of left foot with fat layer exposed: Secondary | ICD-10-CM | POA: Diagnosis not present

## 2023-05-09 DIAGNOSIS — L03116 Cellulitis of left lower limb: Secondary | ICD-10-CM | POA: Diagnosis not present

## 2023-05-09 DIAGNOSIS — I129 Hypertensive chronic kidney disease with stage 1 through stage 4 chronic kidney disease, or unspecified chronic kidney disease: Secondary | ICD-10-CM | POA: Diagnosis not present

## 2023-05-09 DIAGNOSIS — E1122 Type 2 diabetes mellitus with diabetic chronic kidney disease: Secondary | ICD-10-CM | POA: Diagnosis not present

## 2023-05-09 DIAGNOSIS — E1165 Type 2 diabetes mellitus with hyperglycemia: Secondary | ICD-10-CM | POA: Diagnosis not present

## 2023-05-09 DIAGNOSIS — E11621 Type 2 diabetes mellitus with foot ulcer: Secondary | ICD-10-CM | POA: Diagnosis not present

## 2023-05-13 ENCOUNTER — Ambulatory Visit (INDEPENDENT_AMBULATORY_CARE_PROVIDER_SITE_OTHER): Payer: Medicare Other | Admitting: Orthopedic Surgery

## 2023-05-13 ENCOUNTER — Encounter: Payer: Self-pay | Admitting: Orthopedic Surgery

## 2023-05-13 DIAGNOSIS — E1165 Type 2 diabetes mellitus with hyperglycemia: Secondary | ICD-10-CM | POA: Diagnosis not present

## 2023-05-13 DIAGNOSIS — Z9981 Dependence on supplemental oxygen: Secondary | ICD-10-CM | POA: Diagnosis not present

## 2023-05-13 DIAGNOSIS — Z89412 Acquired absence of left great toe: Secondary | ICD-10-CM

## 2023-05-13 DIAGNOSIS — F319 Bipolar disorder, unspecified: Secondary | ICD-10-CM | POA: Diagnosis not present

## 2023-05-13 DIAGNOSIS — L03116 Cellulitis of left lower limb: Secondary | ICD-10-CM | POA: Diagnosis not present

## 2023-05-13 DIAGNOSIS — L97522 Non-pressure chronic ulcer of other part of left foot with fat layer exposed: Secondary | ICD-10-CM | POA: Diagnosis not present

## 2023-05-13 DIAGNOSIS — N189 Chronic kidney disease, unspecified: Secondary | ICD-10-CM | POA: Diagnosis not present

## 2023-05-13 DIAGNOSIS — E1122 Type 2 diabetes mellitus with diabetic chronic kidney disease: Secondary | ICD-10-CM | POA: Diagnosis not present

## 2023-05-13 DIAGNOSIS — J449 Chronic obstructive pulmonary disease, unspecified: Secondary | ICD-10-CM | POA: Diagnosis not present

## 2023-05-13 DIAGNOSIS — Z7984 Long term (current) use of oral hypoglycemic drugs: Secondary | ICD-10-CM | POA: Diagnosis not present

## 2023-05-13 DIAGNOSIS — Z794 Long term (current) use of insulin: Secondary | ICD-10-CM | POA: Diagnosis not present

## 2023-05-13 DIAGNOSIS — E11621 Type 2 diabetes mellitus with foot ulcer: Secondary | ICD-10-CM | POA: Diagnosis not present

## 2023-05-13 DIAGNOSIS — I129 Hypertensive chronic kidney disease with stage 1 through stage 4 chronic kidney disease, or unspecified chronic kidney disease: Secondary | ICD-10-CM | POA: Diagnosis not present
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
## 2023-05-14 ENCOUNTER — Telehealth: Payer: Self-pay

## 2023-05-14 NOTE — Telephone Encounter (Signed)
[facility]/Nicole calling stating patients leg is warm and red like he has cellulitis and she is asking what to do, do you need to see him Looks like he was here just yesterday  Her cb# [phone]

## 2023-05-15 ENCOUNTER — Ambulatory Visit (INDEPENDENT_AMBULATORY_CARE_PROVIDER_SITE_OTHER): Payer: Medicare Other | Admitting: Family

## 2023-05-15 ENCOUNTER — Encounter: Payer: Self-pay | Admitting: Family

## 2023-05-15 ENCOUNTER — Telehealth: Payer: Self-pay | Admitting: Nurse Practitioner

## 2023-05-15 DIAGNOSIS — L03116 Cellulitis of left lower limb: Secondary | ICD-10-CM

## 2023-05-15 DIAGNOSIS — Z89412 Acquired absence of left great toe: Secondary | ICD-10-CM

## 2023-05-15 MED ORDER — CEPHALEXIN 500 MG PO CAPS: 500.0000 mg | ORAL_CAPSULE | Freq: Three times a day (TID) | ORAL | 0 refills | Status: DC

## 2023-05-15 NOTE — Telephone Encounter (Signed)
 Yes, that's fine

## 2023-05-15 NOTE — Telephone Encounter (Signed)
 Can you use his CMP from December? [facility] is asking. Let me know and I will call them back

## 2023-05-15 NOTE — Telephone Encounter (Signed)
 Scott [name] made aware at [facility]
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
## 2023-05-16 DIAGNOSIS — L97522 Non-pressure chronic ulcer of other part of left foot with fat layer exposed: Secondary | ICD-10-CM | POA: Diagnosis not present

## 2023-05-16 DIAGNOSIS — H5010 Unspecified exotropia: Secondary | ICD-10-CM | POA: Diagnosis not present

## 2023-05-16 DIAGNOSIS — E11621 Type 2 diabetes mellitus with foot ulcer: Secondary | ICD-10-CM | POA: Diagnosis not present

## 2023-05-16 DIAGNOSIS — E1165 Type 2 diabetes mellitus with hyperglycemia: Secondary | ICD-10-CM | POA: Diagnosis not present

## 2023-05-16 DIAGNOSIS — E1122 Type 2 diabetes mellitus with diabetic chronic kidney disease: Secondary | ICD-10-CM | POA: Diagnosis not present

## 2023-05-16 DIAGNOSIS — L03116 Cellulitis of left lower limb: Secondary | ICD-10-CM | POA: Diagnosis not present

## 2023-05-16 DIAGNOSIS — H25812 Combined forms of age-related cataract, left eye: Secondary | ICD-10-CM | POA: Diagnosis not present

## 2023-05-16 DIAGNOSIS — I129 Hypertensive chronic kidney disease with stage 1 through stage 4 chronic kidney disease, or unspecified chronic kidney disease: Secondary | ICD-10-CM | POA: Diagnosis not present

## 2023-05-20 ENCOUNTER — Ambulatory Visit (INDEPENDENT_AMBULATORY_CARE_PROVIDER_SITE_OTHER): Payer: Medicare Other | Admitting: Nurse Practitioner

## 2023-05-20 ENCOUNTER — Encounter: Payer: Self-pay | Admitting: Nurse Practitioner

## 2023-05-20 VITALS — BP 122/70 | HR 84 | Ht 69.0 in

## 2023-05-20 DIAGNOSIS — E782 Mixed hyperlipidemia: Secondary | ICD-10-CM | POA: Diagnosis not present

## 2023-05-20 DIAGNOSIS — D0462 Carcinoma in situ of skin of left upper limb, including shoulder: Secondary | ICD-10-CM | POA: Diagnosis not present

## 2023-05-20 DIAGNOSIS — E559 Vitamin D deficiency, unspecified: Secondary | ICD-10-CM

## 2023-05-20 DIAGNOSIS — Z08 Encounter for follow-up examination after completed treatment for malignant neoplasm: Secondary | ICD-10-CM | POA: Diagnosis not present

## 2023-05-20 DIAGNOSIS — E1165 Type 2 diabetes mellitus with hyperglycemia: Secondary | ICD-10-CM | POA: Diagnosis not present

## 2023-05-20 DIAGNOSIS — D225 Melanocytic nevi of trunk: Secondary | ICD-10-CM | POA: Diagnosis not present

## 2023-05-20 DIAGNOSIS — Z7984 Long term (current) use of oral hypoglycemic drugs: Secondary | ICD-10-CM

## 2023-05-20 DIAGNOSIS — C44622 Squamous cell carcinoma of skin of right upper limb, including shoulder: Secondary | ICD-10-CM | POA: Diagnosis not present

## 2023-05-20 DIAGNOSIS — Z85820 Personal history of malignant melanoma of skin: Secondary | ICD-10-CM | POA: Diagnosis not present

## 2023-05-20 DIAGNOSIS — Z794 Long term (current) use of insulin: Secondary | ICD-10-CM | POA: Diagnosis not present

## 2023-05-20 DIAGNOSIS — I10 Essential (primary) hypertension: Secondary | ICD-10-CM | POA: Diagnosis not present

## 2023-05-20 DIAGNOSIS — D2271 Melanocytic nevi of right lower limb, including hip: Secondary | ICD-10-CM | POA: Diagnosis not present

## 2023-05-20 DIAGNOSIS — D485 Neoplasm of uncertain behavior of skin: Secondary | ICD-10-CM | POA: Diagnosis not present

## 2023-05-20 DIAGNOSIS — Z1283 Encounter for screening for malignant neoplasm of skin: Secondary | ICD-10-CM | POA: Diagnosis not present

## 2023-05-20 LAB — POCT GLYCOSYLATED HEMOGLOBIN (HGB A1C): Hemoglobin A1C: 8.4 % — AB (ref 4.0–5.6)
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
## 2023-05-21 DIAGNOSIS — L97522 Non-pressure chronic ulcer of other part of left foot with fat layer exposed: Secondary | ICD-10-CM | POA: Diagnosis not present

## 2023-05-21 DIAGNOSIS — E1165 Type 2 diabetes mellitus with hyperglycemia: Secondary | ICD-10-CM | POA: Diagnosis not present

## 2023-05-21 DIAGNOSIS — L03116 Cellulitis of left lower limb: Secondary | ICD-10-CM | POA: Diagnosis not present

## 2023-05-21 DIAGNOSIS — E11621 Type 2 diabetes mellitus with foot ulcer: Secondary | ICD-10-CM | POA: Diagnosis not present

## 2023-05-21 DIAGNOSIS — I129 Hypertensive chronic kidney disease with stage 1 through stage 4 chronic kidney disease, or unspecified chronic kidney disease: Secondary | ICD-10-CM | POA: Diagnosis not present

## 2023-05-21 DIAGNOSIS — E1122 Type 2 diabetes mellitus with diabetic chronic kidney disease: Secondary | ICD-10-CM | POA: Diagnosis not present

## 2023-05-22 DIAGNOSIS — E1142 Type 2 diabetes mellitus with diabetic polyneuropathy: Secondary | ICD-10-CM | POA: Diagnosis not present

## 2023-05-22 DIAGNOSIS — I10 Essential (primary) hypertension: Secondary | ICD-10-CM | POA: Diagnosis not present

## 2023-05-23 DIAGNOSIS — E1165 Type 2 diabetes mellitus with hyperglycemia: Secondary | ICD-10-CM | POA: Diagnosis not present

## 2023-05-23 DIAGNOSIS — I129 Hypertensive chronic kidney disease with stage 1 through stage 4 chronic kidney disease, or unspecified chronic kidney disease: Secondary | ICD-10-CM | POA: Diagnosis not present

## 2023-05-23 DIAGNOSIS — E1122 Type 2 diabetes mellitus with diabetic chronic kidney disease: Secondary | ICD-10-CM | POA: Diagnosis not present

## 2023-05-23 DIAGNOSIS — L03116 Cellulitis of left lower limb: Secondary | ICD-10-CM | POA: Diagnosis not present

## 2023-05-23 DIAGNOSIS — L97522 Non-pressure chronic ulcer of other part of left foot with fat layer exposed: Secondary | ICD-10-CM | POA: Diagnosis not present

## 2023-05-23 DIAGNOSIS — E11621 Type 2 diabetes mellitus with foot ulcer: Secondary | ICD-10-CM | POA: Diagnosis not present

## 2023-05-27 ENCOUNTER — Telehealth: Payer: Self-pay

## 2023-05-27 ENCOUNTER — Telehealth: Payer: Self-pay | Admitting: *Deleted

## 2023-05-27 DIAGNOSIS — L03116 Cellulitis of left lower limb: Secondary | ICD-10-CM | POA: Diagnosis not present

## 2023-05-27 DIAGNOSIS — E1165 Type 2 diabetes mellitus with hyperglycemia: Secondary | ICD-10-CM | POA: Diagnosis not present

## 2023-05-27 DIAGNOSIS — L97522 Non-pressure chronic ulcer of other part of left foot with fat layer exposed: Secondary | ICD-10-CM | POA: Diagnosis not present

## 2023-05-27 DIAGNOSIS — E1122 Type 2 diabetes mellitus with diabetic chronic kidney disease: Secondary | ICD-10-CM | POA: Diagnosis not present

## 2023-05-27 DIAGNOSIS — I129 Hypertensive chronic kidney disease with stage 1 through stage 4 chronic kidney disease, or unspecified chronic kidney disease: Secondary | ICD-10-CM | POA: Diagnosis not present

## 2023-05-27 DIAGNOSIS — E11621 Type 2 diabetes mellitus with foot ulcer: Secondary | ICD-10-CM | POA: Diagnosis not present

## 2023-05-27 NOTE — Telephone Encounter (Signed)
 Scott [name] with [facility] stating pt finished the keflex that was RX on Saturday  and pt is still super red and super warm touch. Scott [name] would like a call back at [phone] per vm.

## 2023-05-27 NOTE — Telephone Encounter (Signed)
 This has already been taken care of. Pt has an apt for follow up tomorrow morning.

## 2023-05-27 NOTE — Telephone Encounter (Signed)
 Message left on VM for triage pt leg is red. HHN [phone] I called and she advised that the pt finished keflex rx that was written on 05/15/2023 on this past Saturday and the foot/ leg does not look any better. Asked if he could come in tomorrow for eval asked me to call assisted living [phone]. They will bring pt in tomorrow at 10 am

## 2023-05-28 ENCOUNTER — Encounter: Payer: Self-pay | Admitting: Orthopedic Surgery

## 2023-05-28 ENCOUNTER — Ambulatory Visit (INDEPENDENT_AMBULATORY_CARE_PROVIDER_SITE_OTHER): Payer: Medicare Other | Admitting: Orthopedic Surgery

## 2023-05-28 DIAGNOSIS — I872 Venous insufficiency (chronic) (peripheral): Secondary | ICD-10-CM

## 2023-05-28 DIAGNOSIS — L03116 Cellulitis of left lower limb: Secondary | ICD-10-CM

## 2023-05-28 DIAGNOSIS — Z89412 Acquired absence of left great toe: Secondary | ICD-10-CM
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
## 2023-05-29 ENCOUNTER — Ambulatory Visit: Payer: Medicare Other | Admitting: Family

## 2023-05-30 DIAGNOSIS — I129 Hypertensive chronic kidney disease with stage 1 through stage 4 chronic kidney disease, or unspecified chronic kidney disease: Secondary | ICD-10-CM | POA: Diagnosis not present

## 2023-05-30 DIAGNOSIS — E11621 Type 2 diabetes mellitus with foot ulcer: Secondary | ICD-10-CM | POA: Diagnosis not present

## 2023-05-30 DIAGNOSIS — E1165 Type 2 diabetes mellitus with hyperglycemia: Secondary | ICD-10-CM | POA: Diagnosis not present

## 2023-05-30 DIAGNOSIS — L97522 Non-pressure chronic ulcer of other part of left foot with fat layer exposed: Secondary | ICD-10-CM | POA: Diagnosis not present

## 2023-05-30 DIAGNOSIS — E1122 Type 2 diabetes mellitus with diabetic chronic kidney disease: Secondary | ICD-10-CM | POA: Diagnosis not present

## 2023-05-30 DIAGNOSIS — L03116 Cellulitis of left lower limb: Secondary | ICD-10-CM | POA: Diagnosis not present

## 2023-06-05 DIAGNOSIS — L03116 Cellulitis of left lower limb: Secondary | ICD-10-CM | POA: Diagnosis not present

## 2023-06-05 DIAGNOSIS — L97522 Non-pressure chronic ulcer of other part of left foot with fat layer exposed: Secondary | ICD-10-CM | POA: Diagnosis not present

## 2023-06-05 DIAGNOSIS — E1122 Type 2 diabetes mellitus with diabetic chronic kidney disease: Secondary | ICD-10-CM | POA: Diagnosis not present

## 2023-06-05 DIAGNOSIS — I129 Hypertensive chronic kidney disease with stage 1 through stage 4 chronic kidney disease, or unspecified chronic kidney disease: Secondary | ICD-10-CM | POA: Diagnosis not present

## 2023-06-05 DIAGNOSIS — E11621 Type 2 diabetes mellitus with foot ulcer: Secondary | ICD-10-CM | POA: Diagnosis not present

## 2023-06-05 DIAGNOSIS — E1165 Type 2 diabetes mellitus with hyperglycemia: Secondary | ICD-10-CM | POA: Diagnosis not present

## 2023-06-06 DIAGNOSIS — E1165 Type 2 diabetes mellitus with hyperglycemia: Secondary | ICD-10-CM | POA: Diagnosis not present

## 2023-06-06 DIAGNOSIS — L03116 Cellulitis of left lower limb: Secondary | ICD-10-CM | POA: Diagnosis not present

## 2023-06-06 DIAGNOSIS — E11621 Type 2 diabetes mellitus with foot ulcer: Secondary | ICD-10-CM | POA: Diagnosis not present

## 2023-06-06 DIAGNOSIS — I129 Hypertensive chronic kidney disease with stage 1 through stage 4 chronic kidney disease, or unspecified chronic kidney disease: Secondary | ICD-10-CM | POA: Diagnosis not present

## 2023-06-06 DIAGNOSIS — E1122 Type 2 diabetes mellitus with diabetic chronic kidney disease: Secondary | ICD-10-CM | POA: Diagnosis not present

## 2023-06-06 DIAGNOSIS — L97522 Non-pressure chronic ulcer of other part of left foot with fat layer exposed: Secondary | ICD-10-CM | POA: Diagnosis not present

## 2023-06-10 ENCOUNTER — Ambulatory Visit (INDEPENDENT_AMBULATORY_CARE_PROVIDER_SITE_OTHER): Payer: Medicare Other | Admitting: Orthopedic Surgery

## 2023-06-10 DIAGNOSIS — E11621 Type 2 diabetes mellitus with foot ulcer: Secondary | ICD-10-CM | POA: Diagnosis not present

## 2023-06-10 DIAGNOSIS — L03116 Cellulitis of left lower limb: Secondary | ICD-10-CM | POA: Diagnosis not present

## 2023-06-10 DIAGNOSIS — Z89412 Acquired absence of left great toe: Secondary | ICD-10-CM

## 2023-06-10 DIAGNOSIS — E1122 Type 2 diabetes mellitus with diabetic chronic kidney disease: Secondary | ICD-10-CM | POA: Diagnosis not present

## 2023-06-10 DIAGNOSIS — E1165 Type 2 diabetes mellitus with hyperglycemia: Secondary | ICD-10-CM | POA: Diagnosis not present

## 2023-06-10 DIAGNOSIS — L97522 Non-pressure chronic ulcer of other part of left foot with fat layer exposed: Secondary | ICD-10-CM | POA: Diagnosis not present

## 2023-06-10 DIAGNOSIS — I129 Hypertensive chronic kidney disease with stage 1 through stage 4 chronic kidney disease, or unspecified chronic kidney disease: Secondary | ICD-10-CM | POA: Diagnosis not present

## 2023-06-12 ENCOUNTER — Encounter: Payer: Self-pay | Admitting: Urology

## 2023-06-12 ENCOUNTER — Ambulatory Visit (INDEPENDENT_AMBULATORY_CARE_PROVIDER_SITE_OTHER): Payer: Medicare Other | Admitting: Urology

## 2023-06-12 VITALS — BP 109/67 | HR 87

## 2023-06-12 DIAGNOSIS — R339 Retention of urine, unspecified: Secondary | ICD-10-CM

## 2023-06-12 DIAGNOSIS — L97522 Non-pressure chronic ulcer of other part of left foot with fat layer exposed: Secondary | ICD-10-CM | POA: Diagnosis not present

## 2023-06-12 DIAGNOSIS — N189 Chronic kidney disease, unspecified: Secondary | ICD-10-CM | POA: Diagnosis not present

## 2023-06-12 DIAGNOSIS — F319 Bipolar disorder, unspecified: Secondary | ICD-10-CM | POA: Diagnosis not present

## 2023-06-12 DIAGNOSIS — I129 Hypertensive chronic kidney disease with stage 1 through stage 4 chronic kidney disease, or unspecified chronic kidney disease: Secondary | ICD-10-CM | POA: Diagnosis not present

## 2023-06-12 DIAGNOSIS — E11621 Type 2 diabetes mellitus with foot ulcer: Secondary | ICD-10-CM | POA: Diagnosis not present

## 2023-06-12 DIAGNOSIS — E1122 Type 2 diabetes mellitus with diabetic chronic kidney disease: Secondary | ICD-10-CM | POA: Diagnosis not present

## 2023-06-12 DIAGNOSIS — N138 Other obstructive and reflux uropathy: Secondary | ICD-10-CM | POA: Diagnosis not present

## 2023-06-12 DIAGNOSIS — N401 Enlarged prostate with lower urinary tract symptoms: Secondary | ICD-10-CM

## 2023-06-12 DIAGNOSIS — Z7984 Long term (current) use of oral hypoglycemic drugs: Secondary | ICD-10-CM | POA: Diagnosis not present

## 2023-06-12 DIAGNOSIS — J449 Chronic obstructive pulmonary disease, unspecified: Secondary | ICD-10-CM | POA: Diagnosis not present

## 2023-06-12 DIAGNOSIS — E1165 Type 2 diabetes mellitus with hyperglycemia: Secondary | ICD-10-CM | POA: Diagnosis not present

## 2023-06-12 DIAGNOSIS — Z794 Long term (current) use of insulin: Secondary | ICD-10-CM | POA: Diagnosis not present

## 2023-06-12 DIAGNOSIS — L03116 Cellulitis of left lower limb: Secondary | ICD-10-CM | POA: Diagnosis not present

## 2023-06-12 DIAGNOSIS — Z9981 Dependence on supplemental oxygen: Secondary | ICD-10-CM | POA: Diagnosis not present

## 2023-06-12 LAB — URINALYSIS, ROUTINE W REFLEX MICROSCOPIC
Bilirubin, UA: NEGATIVE
Ketones, UA: NEGATIVE
Leukocytes,UA: NEGATIVE
Nitrite, UA: NEGATIVE
RBC, UA: NEGATIVE
Specific Gravity, UA: 1.01 (ref 1.005–1.030)
Urobilinogen, Ur: 0.2 mg/dL (ref 0.2–1.0)
pH, UA: 6.5 (ref 5.0–7.5)

## 2023-06-12 LAB — MICROSCOPIC EXAMINATION
Bacteria, UA: NONE SEEN
RBC, Urine: NONE SEEN /HPF (ref 0–2)
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
## 2023-06-12 NOTE — Patient Instructions (Signed)

## 2023-06-12 NOTE — Progress Notes (Signed)
post void residual=405

## 2023-06-13 ENCOUNTER — Other Ambulatory Visit: Payer: Self-pay | Admitting: Urology

## 2023-06-13 DIAGNOSIS — L97522 Non-pressure chronic ulcer of other part of left foot with fat layer exposed: Secondary | ICD-10-CM | POA: Diagnosis not present

## 2023-06-13 DIAGNOSIS — E1122 Type 2 diabetes mellitus with diabetic chronic kidney disease: Secondary | ICD-10-CM | POA: Diagnosis not present

## 2023-06-13 DIAGNOSIS — L03116 Cellulitis of left lower limb: Secondary | ICD-10-CM | POA: Diagnosis not present

## 2023-06-13 DIAGNOSIS — I129 Hypertensive chronic kidney disease with stage 1 through stage 4 chronic kidney disease, or unspecified chronic kidney disease: Secondary | ICD-10-CM | POA: Diagnosis not present

## 2023-06-13 DIAGNOSIS — E1165 Type 2 diabetes mellitus with hyperglycemia: Secondary | ICD-10-CM | POA: Diagnosis not present

## 2023-06-13 DIAGNOSIS — E11621 Type 2 diabetes mellitus with foot ulcer: Secondary | ICD-10-CM | POA: Diagnosis not present

## 2023-06-14 ENCOUNTER — Telehealth: Payer: Self-pay | Admitting: Orthopedic Surgery

## 2023-06-14 DIAGNOSIS — E1165 Type 2 diabetes mellitus with hyperglycemia: Secondary | ICD-10-CM | POA: Diagnosis not present

## 2023-06-14 DIAGNOSIS — E1122 Type 2 diabetes mellitus with diabetic chronic kidney disease: Secondary | ICD-10-CM | POA: Diagnosis not present

## 2023-06-14 DIAGNOSIS — L97522 Non-pressure chronic ulcer of other part of left foot with fat layer exposed: Secondary | ICD-10-CM | POA: Diagnosis not present

## 2023-06-14 DIAGNOSIS — E11621 Type 2 diabetes mellitus with foot ulcer: Secondary | ICD-10-CM | POA: Diagnosis not present

## 2023-06-14 DIAGNOSIS — L03116 Cellulitis of left lower limb: Secondary | ICD-10-CM | POA: Diagnosis not present

## 2023-06-14 DIAGNOSIS — I129 Hypertensive chronic kidney disease with stage 1 through stage 4 chronic kidney disease, or unspecified chronic kidney disease: Secondary | ICD-10-CM | POA: Diagnosis not present

## 2023-06-14 NOTE — Telephone Encounter (Signed)
 Scott [name] with [facility] requesting verbal order for PT : 2 week 3 1 week 1    Scott [name]'s call back number [phone]

## 2023-06-14 NOTE — Telephone Encounter (Signed)
 I called and sw Amy gave verbal ok for orders as requested below.

## 2023-06-15 ENCOUNTER — Inpatient Hospital Stay (HOSPITAL_COMMUNITY)
Admission: EM | Admit: 2023-06-15 | Discharge: 2023-06-18 | DRG: 871 | Disposition: A | Discharge status: Home Health | Source: Skilled Nursing Facility | Attending: Internal Medicine | Admitting: Internal Medicine

## 2023-06-15 ENCOUNTER — Emergency Department (HOSPITAL_COMMUNITY)

## 2023-06-15 ENCOUNTER — Encounter (HOSPITAL_COMMUNITY): Payer: Self-pay | Admitting: Emergency Medicine

## 2023-06-15 ENCOUNTER — Other Ambulatory Visit: Payer: Self-pay

## 2023-06-15 DIAGNOSIS — Z89432 Acquired absence of left foot: Secondary | ICD-10-CM | POA: Diagnosis not present

## 2023-06-15 DIAGNOSIS — L97522 Non-pressure chronic ulcer of other part of left foot with fat layer exposed: Secondary | ICD-10-CM | POA: Diagnosis not present

## 2023-06-15 DIAGNOSIS — F79 Unspecified intellectual disabilities: Secondary | ICD-10-CM | POA: Diagnosis present

## 2023-06-15 DIAGNOSIS — Z800 Family history of malignant neoplasm of digestive organs: Secondary | ICD-10-CM

## 2023-06-15 DIAGNOSIS — Z79899 Other long term (current) drug therapy: Secondary | ICD-10-CM

## 2023-06-15 DIAGNOSIS — Z720 Tobacco use: Secondary | ICD-10-CM | POA: Diagnosis not present

## 2023-06-15 DIAGNOSIS — R590 Localized enlarged lymph nodes: Secondary | ICD-10-CM | POA: Diagnosis not present

## 2023-06-15 DIAGNOSIS — R81 Glycosuria: Secondary | ICD-10-CM | POA: Diagnosis present

## 2023-06-15 DIAGNOSIS — E1122 Type 2 diabetes mellitus with diabetic chronic kidney disease: Secondary | ICD-10-CM | POA: Diagnosis present

## 2023-06-15 DIAGNOSIS — R600 Localized edema: Secondary | ICD-10-CM | POA: Diagnosis not present

## 2023-06-15 DIAGNOSIS — E1165 Type 2 diabetes mellitus with hyperglycemia: Secondary | ICD-10-CM | POA: Diagnosis present

## 2023-06-15 DIAGNOSIS — N1832 Chronic kidney disease, stage 3b: Secondary | ICD-10-CM | POA: Insufficient documentation

## 2023-06-15 DIAGNOSIS — M7989 Other specified soft tissue disorders: Secondary | ICD-10-CM | POA: Diagnosis not present

## 2023-06-15 DIAGNOSIS — R739 Hyperglycemia, unspecified: Secondary | ICD-10-CM | POA: Diagnosis not present

## 2023-06-15 DIAGNOSIS — R6520 Severe sepsis without septic shock: Secondary | ICD-10-CM | POA: Diagnosis not present

## 2023-06-15 DIAGNOSIS — I10 Essential (primary) hypertension: Secondary | ICD-10-CM | POA: Diagnosis not present

## 2023-06-15 DIAGNOSIS — L03116 Cellulitis of left lower limb: Secondary | ICD-10-CM | POA: Diagnosis not present

## 2023-06-15 DIAGNOSIS — J9601 Acute respiratory failure with hypoxia: Secondary | ICD-10-CM | POA: Diagnosis present

## 2023-06-15 DIAGNOSIS — I129 Hypertensive chronic kidney disease with stage 1 through stage 4 chronic kidney disease, or unspecified chronic kidney disease: Secondary | ICD-10-CM | POA: Diagnosis not present

## 2023-06-15 DIAGNOSIS — F319 Bipolar disorder, unspecified: Secondary | ICD-10-CM | POA: Diagnosis present

## 2023-06-15 DIAGNOSIS — A419 Sepsis, unspecified organism: Principal | ICD-10-CM | POA: Diagnosis present

## 2023-06-15 DIAGNOSIS — J440 Chronic obstructive pulmonary disease with acute lower respiratory infection: Secondary | ICD-10-CM | POA: Diagnosis present

## 2023-06-15 DIAGNOSIS — N400 Enlarged prostate without lower urinary tract symptoms: Secondary | ICD-10-CM | POA: Insufficient documentation

## 2023-06-15 DIAGNOSIS — J432 Centrilobular emphysema: Secondary | ICD-10-CM | POA: Diagnosis not present

## 2023-06-15 DIAGNOSIS — Z794 Long term (current) use of insulin: Secondary | ICD-10-CM

## 2023-06-15 DIAGNOSIS — E11628 Type 2 diabetes mellitus with other skin complications: Secondary | ICD-10-CM | POA: Diagnosis not present

## 2023-06-15 DIAGNOSIS — J441 Chronic obstructive pulmonary disease with (acute) exacerbation: Secondary | ICD-10-CM | POA: Diagnosis present

## 2023-06-15 DIAGNOSIS — J181 Lobar pneumonia, unspecified organism: Secondary | ICD-10-CM | POA: Insufficient documentation

## 2023-06-15 DIAGNOSIS — E860 Dehydration: Secondary | ICD-10-CM | POA: Diagnosis not present

## 2023-06-15 DIAGNOSIS — E782 Mixed hyperlipidemia: Secondary | ICD-10-CM | POA: Diagnosis not present

## 2023-06-15 DIAGNOSIS — Z556 Problems related to health literacy: Secondary | ICD-10-CM

## 2023-06-15 DIAGNOSIS — J9811 Atelectasis: Secondary | ICD-10-CM | POA: Diagnosis not present

## 2023-06-15 DIAGNOSIS — Z1152 Encounter for screening for COVID-19: Secondary | ICD-10-CM

## 2023-06-15 DIAGNOSIS — E1142 Type 2 diabetes mellitus with diabetic polyneuropathy: Secondary | ICD-10-CM | POA: Diagnosis not present

## 2023-06-15 DIAGNOSIS — Z716 Tobacco abuse counseling: Secondary | ICD-10-CM

## 2023-06-15 DIAGNOSIS — R509 Fever, unspecified: Secondary | ICD-10-CM | POA: Diagnosis not present

## 2023-06-15 DIAGNOSIS — R531 Weakness: Secondary | ICD-10-CM | POA: Diagnosis not present

## 2023-06-15 DIAGNOSIS — F17200 Nicotine dependence, unspecified, uncomplicated: Secondary | ICD-10-CM | POA: Diagnosis present

## 2023-06-15 DIAGNOSIS — L0390 Cellulitis, unspecified: Secondary | ICD-10-CM | POA: Diagnosis not present

## 2023-06-15 DIAGNOSIS — E11621 Type 2 diabetes mellitus with foot ulcer: Secondary | ICD-10-CM | POA: Diagnosis not present

## 2023-06-15 DIAGNOSIS — R0689 Other abnormalities of breathing: Secondary | ICD-10-CM | POA: Diagnosis not present

## 2023-06-15 DIAGNOSIS — M19072 Primary osteoarthritis, left ankle and foot: Secondary | ICD-10-CM | POA: Diagnosis not present

## 2023-06-15 DIAGNOSIS — E669 Obesity, unspecified: Secondary | ICD-10-CM | POA: Diagnosis present

## 2023-06-15 DIAGNOSIS — S99922A Unspecified injury of left foot, initial encounter: Secondary | ICD-10-CM | POA: Diagnosis not present

## 2023-06-15 DIAGNOSIS — W19XXXA Unspecified fall, initial encounter: Secondary | ICD-10-CM | POA: Diagnosis not present

## 2023-06-15 DIAGNOSIS — L03119 Cellulitis of unspecified part of limb: Secondary | ICD-10-CM | POA: Diagnosis not present

## 2023-06-15 LAB — URINALYSIS, W/ REFLEX TO CULTURE (INFECTION SUSPECTED)
Bacteria, UA: NONE SEEN
Bilirubin Urine: NEGATIVE
Glucose, UA: >=500 mg/dL — AB
Hgb urine dipstick: NEGATIVE
Ketones, ur: NEGATIVE mg/dL
Leukocytes,Ua: NEGATIVE
Nitrite: NEGATIVE
Protein, ur: 100 mg/dL — AB
Specific Gravity, Urine: 1.005 (ref 1.005–1.030)
pH: 6 (ref 5.0–8.0)

## 2023-06-15 LAB — CBC WITH DIFFERENTIAL/PLATELET
Abs Immature Granulocytes: 0.03 10*3/uL (ref 0.00–0.07)
Basophils Absolute: 0 10*3/uL (ref 0.0–0.1)
Basophils Relative: 0 %
Eosinophils Absolute: 0.1 10*3/uL (ref 0.0–0.5)
Eosinophils Relative: 1 %
HCT: 34.1 % — ABNORMAL LOW (ref 39.0–52.0)
Hemoglobin: 10.8 g/dL — ABNORMAL LOW (ref 13.0–17.0)
Immature Granulocytes: 0 %
Lymphocytes Relative: 19 %
Lymphs Abs: 1.6 10*3/uL (ref 0.7–4.0)
MCH: 30.5 pg (ref 26.0–34.0)
MCHC: 31.7 g/dL (ref 30.0–36.0)
MCV: 96.3 fL (ref 80.0–100.0)
Monocytes Absolute: 0.8 10*3/uL (ref 0.1–1.0)
Monocytes Relative: 10 %
Neutro Abs: 5.7 10*3/uL (ref 1.7–7.7)
Neutrophils Relative %: 70 %
Platelets: 181 10*3/uL (ref 150–400)
RBC: 3.54 MIL/uL — ABNORMAL LOW (ref 4.22–5.81)
RDW: 15.2 % (ref 11.5–15.5)
WBC: 8.1 10*3/uL (ref 4.0–10.5)
nRBC: 0 % (ref 0.0–0.2)

## 2023-06-15 LAB — COMPREHENSIVE METABOLIC PANEL
ALT: 20 U/L (ref 0–44)
AST: 22 U/L (ref 15–41)
Albumin: 2.9 g/dL — ABNORMAL LOW (ref 3.5–5.0)
Alkaline Phosphatase: 73 U/L (ref 38–126)
Anion gap: 10 (ref 5–15)
BUN: 32 mg/dL — ABNORMAL HIGH (ref 8–23)
CO2: 23 mmol/L (ref 22–32)
Calcium: 8.8 mg/dL — ABNORMAL LOW (ref 8.9–10.3)
Chloride: 102 mmol/L (ref 98–111)
Creatinine, Ser: 1.97 mg/dL — ABNORMAL HIGH (ref 0.61–1.24)
GFR, Estimated: 38 mL/min — ABNORMAL LOW (ref >60)
Glucose, Bld: 218 mg/dL — ABNORMAL HIGH (ref 70–99)
Potassium: 3.8 mmol/L (ref 3.5–5.1)
Sodium: 135 mmol/L (ref 135–145)
Total Bilirubin: 0.4 mg/dL (ref 0.0–1.2)
Total Protein: 7 g/dL (ref 6.5–8.1)

## 2023-06-15 LAB — RESP PANEL BY RT-PCR (RSV, FLU A&B, COVID)  RVPGX2
Influenza A by PCR: NEGATIVE
Influenza B by PCR: NEGATIVE
Resp Syncytial Virus by PCR: NEGATIVE
SARS Coronavirus 2 by RT PCR: NEGATIVE

## 2023-06-15 LAB — PROTIME-INR
INR: 1 (ref 0.8–1.2)
Prothrombin Time: 13.7 s (ref 11.4–15.2)

## 2023-06-15 LAB — LACTIC ACID, PLASMA: Lactic Acid, Venous: 2.5 mmol/L (ref 0.5–1.9)

## 2023-06-15 MED ORDER — LACTATED RINGERS IV BOLUS (SEPSIS)
1000.0000 mL | Freq: Once | INTRAVENOUS | Status: AC
  Administered 2023-06-16: 1000 mL via INTRAVENOUS

## 2023-06-15 MED ORDER — ACETAMINOPHEN 325 MG PO TABS
650.0000 mg | ORAL_TABLET | Freq: Once | ORAL | Status: AC
Start: 2023-06-15 — End: 2023-06-15
  Administered 2023-06-15: 650 mg via ORAL
  Filled 2023-06-15: qty 2

## 2023-06-15 MED ORDER — LACTATED RINGERS IV BOLUS: 30.0000 mL/kg | Freq: Once | INTRAVENOUS | Status: DC

## 2023-06-15 MED ORDER — LACTATED RINGERS IV BOLUS
1000.0000 mL | Freq: Once | INTRAVENOUS | Status: DC
Start: 2023-06-15 — End: 2023-06-15

## 2023-06-15 MED ORDER — SODIUM CHLORIDE 0.9 % IV SOLN
2.0000 g | Freq: Once | INTRAVENOUS | Status: AC
  Administered 2023-06-16: 2 g via INTRAVENOUS
  Filled 2023-06-15: qty 12.5

## 2023-06-15 MED ORDER — VANCOMYCIN HCL IN DEXTROSE 1-5 GM/200ML-% IV SOLN
1000.0000 mg | Freq: Once | INTRAVENOUS | Status: AC
  Administered 2023-06-16: 1000 mg via INTRAVENOUS
  Filled 2023-06-15: qty 200

## 2023-06-15 MED ORDER — LACTATED RINGERS IV SOLN: INTRAVENOUS | Status: AC
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
## 2023-06-15 NOTE — ED Triage Notes (Addendum)
 Pt bib EMS after he fell tonight and landed on his "bottom". Staff stated that pt has had increased weakness and also had a fever 100.7. EMS reports blood glucose of 288 and 90% sats on R/A. Pt reports pain in L foot. EMS states pt had L great toe removed approximately 2 weeks ago. L foot and L lower leg red and warm.

## 2023-06-16 ENCOUNTER — Inpatient Hospital Stay (HOSPITAL_COMMUNITY)

## 2023-06-16 DIAGNOSIS — N1832 Chronic kidney disease, stage 3b: Secondary | ICD-10-CM | POA: Insufficient documentation

## 2023-06-16 DIAGNOSIS — I129 Hypertensive chronic kidney disease with stage 1 through stage 4 chronic kidney disease, or unspecified chronic kidney disease: Secondary | ICD-10-CM | POA: Diagnosis present

## 2023-06-16 DIAGNOSIS — Z720 Tobacco use: Secondary | ICD-10-CM

## 2023-06-16 DIAGNOSIS — R590 Localized enlarged lymph nodes: Secondary | ICD-10-CM | POA: Diagnosis not present

## 2023-06-16 DIAGNOSIS — L97522 Non-pressure chronic ulcer of other part of left foot with fat layer exposed: Secondary | ICD-10-CM | POA: Diagnosis not present

## 2023-06-16 DIAGNOSIS — E1142 Type 2 diabetes mellitus with diabetic polyneuropathy: Secondary | ICD-10-CM | POA: Insufficient documentation

## 2023-06-16 DIAGNOSIS — J181 Lobar pneumonia, unspecified organism: Secondary | ICD-10-CM | POA: Diagnosis present

## 2023-06-16 DIAGNOSIS — R81 Glycosuria: Secondary | ICD-10-CM | POA: Diagnosis present

## 2023-06-16 DIAGNOSIS — Z1152 Encounter for screening for COVID-19: Secondary | ICD-10-CM | POA: Diagnosis not present

## 2023-06-16 DIAGNOSIS — Z800 Family history of malignant neoplasm of digestive organs: Secondary | ICD-10-CM | POA: Diagnosis not present

## 2023-06-16 DIAGNOSIS — I10 Essential (primary) hypertension: Secondary | ICD-10-CM

## 2023-06-16 DIAGNOSIS — E11621 Type 2 diabetes mellitus with foot ulcer: Secondary | ICD-10-CM | POA: Diagnosis not present

## 2023-06-16 DIAGNOSIS — E782 Mixed hyperlipidemia: Secondary | ICD-10-CM | POA: Diagnosis present

## 2023-06-16 DIAGNOSIS — E1122 Type 2 diabetes mellitus with diabetic chronic kidney disease: Secondary | ICD-10-CM | POA: Diagnosis present

## 2023-06-16 DIAGNOSIS — J9811 Atelectasis: Secondary | ICD-10-CM | POA: Diagnosis not present

## 2023-06-16 DIAGNOSIS — J9601 Acute respiratory failure with hypoxia: Secondary | ICD-10-CM | POA: Diagnosis present

## 2023-06-16 DIAGNOSIS — F17200 Nicotine dependence, unspecified, uncomplicated: Secondary | ICD-10-CM | POA: Diagnosis present

## 2023-06-16 DIAGNOSIS — L03119 Cellulitis of unspecified part of limb: Secondary | ICD-10-CM

## 2023-06-16 DIAGNOSIS — A419 Sepsis, unspecified organism: Secondary | ICD-10-CM | POA: Diagnosis present

## 2023-06-16 DIAGNOSIS — Z79899 Other long term (current) drug therapy: Secondary | ICD-10-CM | POA: Diagnosis not present

## 2023-06-16 DIAGNOSIS — J441 Chronic obstructive pulmonary disease with (acute) exacerbation: Secondary | ICD-10-CM | POA: Diagnosis present

## 2023-06-16 DIAGNOSIS — R509 Fever, unspecified: Secondary | ICD-10-CM | POA: Diagnosis present

## 2023-06-16 DIAGNOSIS — R6520 Severe sepsis without septic shock: Secondary | ICD-10-CM

## 2023-06-16 DIAGNOSIS — N400 Enlarged prostate without lower urinary tract symptoms: Secondary | ICD-10-CM | POA: Insufficient documentation

## 2023-06-16 DIAGNOSIS — E11628 Type 2 diabetes mellitus with other skin complications: Secondary | ICD-10-CM | POA: Diagnosis not present

## 2023-06-16 DIAGNOSIS — E860 Dehydration: Secondary | ICD-10-CM | POA: Diagnosis present

## 2023-06-16 DIAGNOSIS — Z89432 Acquired absence of left foot: Secondary | ICD-10-CM | POA: Diagnosis not present

## 2023-06-16 DIAGNOSIS — F319 Bipolar disorder, unspecified: Secondary | ICD-10-CM | POA: Diagnosis present

## 2023-06-16 DIAGNOSIS — L03116 Cellulitis of left lower limb: Secondary | ICD-10-CM | POA: Diagnosis present

## 2023-06-16 DIAGNOSIS — J440 Chronic obstructive pulmonary disease with acute lower respiratory infection: Secondary | ICD-10-CM | POA: Diagnosis present

## 2023-06-16 DIAGNOSIS — J432 Centrilobular emphysema: Secondary | ICD-10-CM | POA: Diagnosis not present

## 2023-06-16 DIAGNOSIS — L0390 Cellulitis, unspecified: Secondary | ICD-10-CM | POA: Diagnosis not present

## 2023-06-16 DIAGNOSIS — Z794 Long term (current) use of insulin: Secondary | ICD-10-CM | POA: Diagnosis not present

## 2023-06-16 DIAGNOSIS — F79 Unspecified intellectual disabilities: Secondary | ICD-10-CM | POA: Diagnosis present

## 2023-06-16 DIAGNOSIS — E1165 Type 2 diabetes mellitus with hyperglycemia: Secondary | ICD-10-CM | POA: Diagnosis present

## 2023-06-16 LAB — GLUCOSE, CAPILLARY
Glucose-Capillary: 140 mg/dL — ABNORMAL HIGH (ref 70–99)
Glucose-Capillary: 167 mg/dL — ABNORMAL HIGH (ref 70–99)

## 2023-06-16 LAB — BLOOD GAS, VENOUS
Acid-Base Excess: 5 mmol/L — ABNORMAL HIGH (ref 0.0–2.0)
Bicarbonate: 31.8 mmol/L — ABNORMAL HIGH (ref 20.0–28.0)
Drawn by: 27160
O2 Saturation: 35.6 %
Patient temperature: 37.2
pCO2, Ven: 55 mmHg (ref 44–60)
pH, Ven: 7.37 (ref 7.25–7.43)
pO2, Ven: <31 mmHg — CL (ref 32–45)

## 2023-06-16 LAB — LACTIC ACID, PLASMA: Lactic Acid, Venous: 1.8 mmol/L (ref 0.5–1.9)

## 2023-06-16 LAB — MAGNESIUM: Magnesium: 1.9 mg/dL (ref 1.7–2.4)

## 2023-06-16 LAB — CBG MONITORING, ED
Glucose-Capillary: 83 mg/dL (ref 70–99)
Glucose-Capillary: 128 mg/dL — ABNORMAL HIGH (ref 70–99)

## 2023-06-16 LAB — PROCALCITONIN: Procalcitonin: 0.41 ng/mL

## 2023-06-16 LAB — BRAIN NATRIURETIC PEPTIDE: B Natriuretic Peptide: 202 pg/mL — ABNORMAL HIGH (ref 0.0–100.0)

## 2023-06-16 LAB — PHOSPHORUS: Phosphorus: 2.4 mg/dL — ABNORMAL LOW (ref 2.5–4.6)

## 2023-06-16 LAB — LITHIUM LEVEL: Lithium Lvl: 0.7 mmol/L (ref 0.60–1.20)

## 2023-06-16 MED ORDER — ENOXAPARIN SODIUM 40 MG/0.4ML IJ SOSY
40.0000 mg | PREFILLED_SYRINGE | INTRAMUSCULAR | Status: DC
Start: 2023-06-16 — End: 2023-06-18
  Administered 2023-06-16 – 2023-06-18 (×3): 40 mg via SUBCUTANEOUS
  Filled 2023-06-16 (×3): qty 0.4

## 2023-06-16 MED ORDER — INSULIN GLARGINE-YFGN 100 UNIT/ML ~~LOC~~ SOLN
10.0000 [IU] | Freq: Every day | SUBCUTANEOUS | Status: DC
  Administered 2023-06-16 – 2023-06-18 (×3): 10 [IU] via SUBCUTANEOUS
  Filled 2023-06-16 (×4): qty 0.1

## 2023-06-16 MED ORDER — MORPHINE SULFATE (PF) 2 MG/ML IV SOLN: 2.0000 mg | INTRAVENOUS | Status: DC | PRN

## 2023-06-16 MED ORDER — IPRATROPIUM-ALBUTEROL 0.5-2.5 (3) MG/3ML IN SOLN
3.0000 mL | Freq: Four times a day (QID) | RESPIRATORY_TRACT | Status: DC
  Administered 2023-06-16 (×2): 3 mL via RESPIRATORY_TRACT
  Filled 2023-06-16 (×2): qty 3

## 2023-06-16 MED ORDER — INSULIN GLARGINE-YFGN 100 UNIT/ML ~~LOC~~ SOLN
20.0000 [IU] | Freq: Every day | SUBCUTANEOUS | Status: DC
  Filled 2023-06-16 (×2): qty 0.2

## 2023-06-16 MED ORDER — VANCOMYCIN HCL IN DEXTROSE 1-5 GM/200ML-% IV SOLN
1000.0000 mg | INTRAVENOUS | Status: DC
  Administered 2023-06-17 (×2): 1000 mg via INTRAVENOUS
  Filled 2023-06-16 (×2): qty 200

## 2023-06-16 MED ORDER — INSULIN ASPART 100 UNIT/ML IJ SOLN: 0.0000 [IU] | Freq: Every day | INTRAMUSCULAR | Status: DC

## 2023-06-16 MED ORDER — INSULIN GLARGINE-YFGN 100 UNIT/ML ~~LOC~~ SOPN
20.0000 [IU] | PEN_INJECTOR | Freq: Every day | SUBCUTANEOUS | Status: DC
  Filled 2023-06-16: qty 3

## 2023-06-16 MED ORDER — INSULIN GLARGINE-YFGN 100 UNIT/ML ~~LOC~~ SOLN
15.0000 [IU] | Freq: Every day | SUBCUTANEOUS | Status: DC
  Filled 2023-06-16 (×2): qty 0.15

## 2023-06-16 MED ORDER — SODIUM CHLORIDE 0.9 % IV SOLN
2.0000 g | Freq: Two times a day (BID) | INTRAVENOUS | Status: DC
  Administered 2023-06-16 – 2023-06-18 (×4): 2 g via INTRAVENOUS
  Filled 2023-06-16 (×4): qty 12.5

## 2023-06-16 MED ORDER — ONDANSETRON HCL 4 MG PO TABS: 4.0000 mg | ORAL_TABLET | Freq: Four times a day (QID) | ORAL | Status: DC | PRN

## 2023-06-16 MED ORDER — ACETAMINOPHEN 325 MG PO TABS: 650.0000 mg | ORAL_TABLET | Freq: Four times a day (QID) | ORAL | Status: DC | PRN

## 2023-06-16 MED ORDER — ACETAMINOPHEN 650 MG RE SUPP: 650.0000 mg | Freq: Four times a day (QID) | RECTAL | Status: DC | PRN

## 2023-06-16 MED ORDER — INSULIN GLARGINE 100 UNIT/ML ~~LOC~~ SOLN
10.0000 [IU] | Freq: Every day | SUBCUTANEOUS | Status: DC
  Filled 2023-06-16: qty 0.1

## 2023-06-16 MED ORDER — INSULIN ASPART 100 UNIT/ML IJ SOLN
0.0000 [IU] | Freq: Three times a day (TID) | INTRAMUSCULAR | Status: DC
  Administered 2023-06-16 (×2): 1 [IU] via SUBCUTANEOUS
  Administered 2023-06-17: 4 [IU] via SUBCUTANEOUS
  Administered 2023-06-17: 2 [IU] via SUBCUTANEOUS

## 2023-06-16 MED ORDER — DOXYCYCLINE HYCLATE 100 MG PO TABS
100.0000 mg | ORAL_TABLET | Freq: Two times a day (BID) | ORAL | Status: DC
  Administered 2023-06-16 – 2023-06-18 (×5): 100 mg via ORAL
  Filled 2023-06-16 (×5): qty 1

## 2023-06-16 MED ORDER — BUDESONIDE 0.5 MG/2ML IN SUSP
0.5000 mg | Freq: Two times a day (BID) | RESPIRATORY_TRACT | Status: DC
  Administered 2023-06-16 – 2023-06-18 (×5): 0.5 mg via RESPIRATORY_TRACT
  Filled 2023-06-16 (×5): qty 2

## 2023-06-16 MED ORDER — ARFORMOTEROL TARTRATE 15 MCG/2ML IN NEBU
15.0000 ug | INHALATION_SOLUTION | Freq: Two times a day (BID) | RESPIRATORY_TRACT | Status: DC
  Administered 2023-06-16 – 2023-06-18 (×5): 15 ug via RESPIRATORY_TRACT
  Filled 2023-06-16 (×5): qty 2

## 2023-06-16 MED ORDER — ONDANSETRON HCL 4 MG/2ML IJ SOLN: 4.0000 mg | Freq: Four times a day (QID) | INTRAMUSCULAR | Status: DC | PRN

## 2023-06-16 NOTE — Progress Notes (Addendum)
 PROGRESS NOTE  Scott [name]:[id number] DOB: 02-08-1961 DOA: 06/15/2023 PCP: Benetta Spar, MD  Brief History:  63 year old male with a history of hypertension, diabetes mellitus type 2, hyperlipidemia, bipolar disorder, tobacco abuse (quit 1 yr ago), COPD, and TBI presenting from [facility] with fever, a fall and generalized weakness.  The patient himself cannot tell me exactly how long he has had fevers.  His history can be a little bit contradictory.  He relates a mechanical fall earlier in the week.  He denied any loss of consciousness or hitting his head.  He related 1 week history of pain in his left leg with some redness.  He has not noted any drainage.  He usually ambulates with a cane.  Apparently, he has had been having difficulty getting up because of worsening generalized weakness.  He complains of a cough but denies any shortness of breath, chest pain, headache, neck pain.  He denies any mopped assist.  He denies any nausea, vomiting, diarrhea, abdominal pain, hematochezia, melena.  There is no dysuria or hematuria.  He states that he has been wearing his support hose on his legs. The patient states that he quit smoking over a year ago.  He has a nearly 40-pack-year history. In the ED, the patient was febrile to 103.2 F with tachycardia.  He was hemodynamically stable.  Oxygen saturation was 86% room air.  He was placed on 4 L with saturation up to 93-94%.  Chest x-ray was negative for infiltrates.  X-ray of the left foot and left ankle were negative for fractures or dislocation. WBC 8.1, hemoglobin 10.8, platelets 181.  Sodium 135, potassium 3.8, bicarbonate 23, serum creatinine 1.97.  Lactic acid 2.5>> 1.8.  Lithium level 0.70.  EKG showed artifact with sinus rhythm.  COVID-19 PCR is negative.  UA neg for pyuria.  Patient was started on vancomycin and cefepime.  Notably, the patient had a recent hospital admission from 03/25/2023 to 04/02/2023 secondary to  osteomyelitis of his left foot.  He underwent amputation of his left first ray by Dr. Lajoyce Corners.   Assessment/Plan:  Severe sepsis -Present on admission -Secondary to cellulitis and likely  pulmonary source -Lactic acid peaked 2.5>> 1.6 -Check PCT  -Continue IV fluids -Continue vancomycin and cefepime pending culture data -UA--no pyuria   Acute respiratory failure with hypoxia -Personally reviewed chest x-ray--increased interstitial markings without frank consolidation -obtain CT chest -add doxy -Stable on 4 L -Wean oxygen for saturation greater 92% -check VBG   COPD exacerbation -Start Brovana -Start Pulmicort -Start DuoNebs -COVID-19 PCR negative/influenza negative -Check viral respiratory panel   Cellulitis left lower extremity -See pictures below -Good palpable pulses -MRI leg and foot if no improvement or persistent fever   CKD 3b -Baseline creatinine 1.6-1.8 -Continue IV fluids -Holding furosemide   Uncontrolled diabetes mellitus type 2 with hyperglycemia -NovoLog sliding scale -05/20/2023 hemoglobin A1c 8.4 -Holding glipizide and tradjenta -start semglee 10 units daily   Hx of osteomyelitis left hallux -See pictures below -s/p left first ray amputation 03/29/23--Dr. Lajoyce Corners   Essential hypertension -Hold amlodipine -restart metoprolol -monitor clinically   Mixed hyperlipidemia -Continue statin   Bipolar disorder -Continue valproic acid, lithium, benztropine, respiradone   Family Communication:  no  Family at bedside   Consultants:  none   Code Status:  FULL   DVT Prophylaxis: Lamar Lovenox     Procedures: As Listed in Progress Note Above   Antibiotics: Vanc 3/8>> Cefepime 3/8>>  Total time spent 50  minutes.  Greater than 50% spent face to face counseling and coordinating care.    Subjective: Patient complains of a cough.  He denies any nausea, vomiting, diarrhea, abdominal pain.  He denies any chest pain.  He denies any hematochezia or  melena.  He complains of some pain in his left leg.  He denies any headache or neck pain.  Objective: Vitals:   06/16/23 0700 06/16/23 0800 06/16/23 0900 06/16/23 1000  BP: 136/78 132/79 (!) 140/79 (!) 157/94  Pulse: 87 90 92 92  Resp: 14 15 15 19   Temp: 98.9 F (37.2 C)     TempSrc: Oral     SpO2: 94% 93% 94% 96%  Weight:      Height:        Intake/Output Summary (Last 24 hours) at 06/16/2023 1058 Last data filed at 06/16/2023 0943 Gross per 24 hour  Intake 3424.41 ml  Output 5450 ml  Net -2025.59 ml   Weight change:  Exam:  General:  Pt is alert, follows commands appropriately, not in acute distress HEENT: No icterus, No thrush, No neck mass, [place]/AT Cardiovascular: RRR, S1/S2, no rubs, no gallops Respiratory: Diminished breath sounds bilateral.  Scattered bilateral rales.  No wheezing. Abdomen: Soft/+BS, non tender, non distended, no guarding Extremities: trace LE edema, No lymphangitis, No petechiae, No rashes, no synovitis.  See pics of left leg below             Data Reviewed: I have personally reviewed following labs and imaging studies Basic Metabolic Panel: Recent Labs  Lab 06/15/23 2308 06/16/23 0628  NA 135  --   K 3.8  --   CL 102  --   CO2 23  --   GLUCOSE 218*  --   BUN 32*  --   CREATININE 1.97*  --   CALCIUM 8.8*  --   MG  --  1.9  PHOS  --  2.4*   Liver Function Tests: Recent Labs  Lab 06/15/23 2308  AST 22  ALT 20  ALKPHOS 73  BILITOT 0.4  PROT 7.0  ALBUMIN 2.9*   No results for input(s): "LIPASE", "AMYLASE" in the last 168 hours. No results for input(s): "AMMONIA" in the last 168 hours. Coagulation Profile: Recent Labs  Lab 06/15/23 2308  INR 1.0   CBC: Recent Labs  Lab 06/15/23 2308  WBC 8.1  NEUTROABS 5.7  HGB 10.8*  HCT 34.1*  MCV 96.3  PLT 181   Cardiac Enzymes: No results for input(s): "CKTOTAL", "CKMB", "CKMBINDEX", "TROPONINI" in the last 168 hours. BNP: Invalid input(s): "POCBNP" CBG: Recent Labs   Lab 06/16/23 0714  GLUCAP 83   HbA1C: No results for input(s): "HGBA1C" in the last 72 hours. Urine analysis:    Component Value Date/Time   COLORURINE STRAW (A) 06/15/2023 2308   APPEARANCEUR CLEAR 06/15/2023 2308   APPEARANCEUR Clear 06/12/2023 0928   LABSPEC 1.005 06/15/2023 2308   PHURINE 6.0 06/15/2023 2308   GLUCOSEU >=500 (A) 06/15/2023 2308   HGBUR NEGATIVE 06/15/2023 2308   BILIRUBINUR NEGATIVE 06/15/2023 2308   BILIRUBINUR Negative 06/12/2023 0928   KETONESUR NEGATIVE 06/15/2023 2308   PROTEINUR 100 (A) 06/15/2023 2308   UROBILINOGEN 0.2 07/17/2011 2014   NITRITE NEGATIVE 06/15/2023 2308   LEUKOCYTESUR NEGATIVE 06/15/2023 2308   Sepsis Labs: @LABRCNTIP (procalcitonin:4,lacticidven:4) ) Recent Results (from the past 240 hours)  Microscopic Examination     Status: None   Collection Time: 06/12/23  9:28 AM   Urine  Result  Value Ref Range Status   WBC, UA 0-5 0 - 5 /hpf Final   RBC, Urine None seen 0 - 2 /hpf Final   Epithelial Cells (non renal) 0-10 0 - 10 /hpf Final   Bacteria, UA None seen None seen/Few Final  Culture, blood (Routine x 2)     Status: None (Preliminary result)   Collection Time: 06/15/23 11:08 PM   Specimen: BLOOD  Result Value Ref Range Status   Specimen Description BLOOD BLOOD LEFT ARM  Final   Special Requests NONE  Final   Culture   Final    NO GROWTH < 12 HOURS Performed at [facility], [address], [place], [place] [address]    Report Status PENDING  Incomplete  Resp panel by RT-PCR (RSV, Flu A&B, Covid) Anterior Nasal Swab     Status: None   Collection Time: 06/15/23 11:08 PM   Specimen: Anterior Nasal Swab  Result Value Ref Range Status   SARS Coronavirus 2 by RT PCR NEGATIVE NEGATIVE Final    Comment: (NOTE) SARS-CoV-2 target nucleic acids are NOT DETECTED.  The SARS-CoV-2 RNA is generally detectable in upper respiratory specimens during the acute phase of infection. The lowest concentration of SARS-CoV-2 viral copies this  assay can detect is 138 copies/mL. A negative result does not preclude SARS-Cov-2 infection and should not be used as the sole basis for treatment or other patient management decisions. A negative result may occur with  improper specimen collection/handling, submission of specimen other than nasopharyngeal swab, presence of viral mutation(s) within the areas targeted by this assay, and inadequate number of viral copies(<138 copies/mL). A negative result must be combined with clinical observations, patient history, and epidemiological information. The expected result is Negative.  Fact Sheet for Patients:  BloggerCourse.com  Fact Sheet for Healthcare Providers:  SeriousBroker.it  This test is no t yet approved or cleared by the [place] FDA and  has been authorized for detection and/or diagnosis of SARS-CoV-2 by FDA under an Emergency Use Authorization (EUA). This EUA will remain  in effect (meaning this test can be used) for the duration of the COVID-19 declaration under Section 564(b)(1) of the Act, 21 U.S.C.section 360bbb-3(b)(1), unless the authorization is terminated  or revoked sooner.       Influenza A by PCR NEGATIVE NEGATIVE Final   Influenza B by PCR NEGATIVE NEGATIVE Final    Comment: (NOTE) The Xpert Xpress SARS-CoV-2/FLU/RSV plus assay is intended as an aid in the diagnosis of influenza from Nasopharyngeal swab specimens and should not be used as a sole basis for treatment. Nasal washings and aspirates are unacceptable for Xpert Xpress SARS-CoV-2/FLU/RSV testing.  Fact Sheet for Patients: BloggerCourse.com  Fact Sheet for Healthcare Providers: SeriousBroker.it  This test is not yet approved or cleared by the [place] FDA and has been authorized for detection and/or diagnosis of SARS-CoV-2 by FDA under an Emergency Use Authorization (EUA). This EUA will  remain in effect (meaning this test can be used) for the duration of the COVID-19 declaration under Section 564(b)(1) of the Act, 21 U.S.C. section 360bbb-3(b)(1), unless the authorization is terminated or revoked.     Resp Syncytial Virus by PCR NEGATIVE NEGATIVE Final    Comment: (NOTE) Fact Sheet for Patients: BloggerCourse.com  Fact Sheet for Healthcare Providers: SeriousBroker.it  This test is not yet approved or cleared by the [place] FDA and has been authorized for detection and/or diagnosis of SARS-CoV-2 by FDA under an Emergency Use Authorization (EUA). This EUA will remain  in effect (meaning this test can be used) for the duration of the COVID-19 declaration under Section 564(b)(1) of the Act, 21 U.S.C. section 360bbb-3(b)(1), unless the authorization is terminated or revoked.  Performed at [facility], [address], [place], [place] [address]   Culture, blood (Routine x 2)     Status: None (Preliminary result)   Collection Time: 06/15/23 11:28 PM   Specimen: BLOOD LEFT HAND  Result Value Ref Range Status   Specimen Description BLOOD LEFT HAND  Final   Special Requests NONE  Final   Culture   Final    NO GROWTH < 12 HOURS Performed at [facility], [address] [place], [place] [address]    Report Status PENDING  Incomplete     Scheduled Meds:  arformoterol  15 mcg Nebulization BID   budesonide (PULMICORT) nebulizer solution  0.5 mg Nebulization BID   doxycycline  100 mg Oral Q12H   enoxaparin (LOVENOX) injection  40 mg Subcutaneous Q24H   insulin aspart  0-5 Units Subcutaneous QHS   insulin aspart  0-9 Units Subcutaneous TID WC   insulin glargine-yfgn  10 Units Subcutaneous Daily   ipratropium-albuterol  3 mL Nebulization Q6H   Continuous Infusions:  ceFEPime (MAXIPIME) IV     lactated ringers 150 mL/hr at 06/16/23 0343   [START ON 06/17/2023] vancomycin      Procedures/Studies: DG Chest  Portable 1 View Result Date: 06/16/2023 CLINICAL DATA:  Sepsis protocol, increased weakness and fever, incidental recent fall. Left foot and left foreleg injury. Left foot first ray amputation on 04/06/2023. EXAM: PORTABLE CHEST 1 VIEW LEFT TIBIA FIBULA 2 VIEWS LEFT FOOT 3 VIEWS COMPARISON:  CTA chest 03/08/2023, preoperative left foot AP and lateral 03/25/2023, left tibia fibula 03/07/2023. FINDINGS: Chest AP portable 11:39 p.m.: The heart size and mediastinal contours are within normal limits. There is aortic atherosclerosis. Multiple overlying monitor wires. Both lungs are clear. The visualized skeletal structures are unremarkable. AP and lateral left tibia fibula: There is mild osteopenia without evidence of fractures. Lateral sideplate ORIF fixation hardware in proximal tibia and securing screws are unremarkable. No interval change in appearance. No primary pathologic bone lesion is seen. There is mild subcutaneous stranding edema in the foreleg and ankle. There is moderate tricompartmental arthrosis of the knee and mild tibiotalar arthrosis. No soft tissue gas or foreign bodies are seen. The soft tissue edema was greater previously but in all other respects there are no further changes. Left foot three views: Since 03/25/2023, interval first ray amputation. There is mild-to-moderate edema in the foot. No soft tissue gas is seen. There is osteopenia without evidence of fractures. Hyperextension at the MTP joints. There are mild features of midfoot arthrosis. Small plantar and dorsal calcaneal spurring. There is no aggressive osseous lesion. Osteophytes superior talonavicular joint. IMPRESSION: 1. No evidence of acute chest disease. 2. Osteopenia and degenerative change without evidence of left tibiofibular or left foot fractures. 3. Mild subcutaneous stranding edema in the foreleg and ankle. No soft tissue gas or foreign bodies are seen. Edema throughout the foot. 4. Interval left foot first ray amputation  since 03/25/2023. 5. Aortic atherosclerosis. Electronically Signed   By: Almira Bar M.D.   On: 06/16/2023 00:23   DG Tibia/Fibula Left Result Date: 06/16/2023 CLINICAL DATA:  Sepsis protocol, increased weakness and fever, incidental recent fall. Left foot and left foreleg injury. Left foot first ray amputation on 04/06/2023. EXAM: PORTABLE CHEST 1 VIEW LEFT TIBIA FIBULA 2 VIEWS LEFT FOOT 3 VIEWS COMPARISON:  CTA chest 03/08/2023, preoperative left foot AP and lateral 03/25/2023, left tibia fibula 03/07/2023. FINDINGS: Chest AP portable 11:39 p.m.: The heart size and mediastinal contours are within normal limits. There is aortic atherosclerosis. Multiple overlying monitor wires. Both lungs are clear. The visualized skeletal structures are unremarkable. AP and lateral left tibia fibula: There is mild osteopenia without evidence of fractures. Lateral sideplate ORIF fixation hardware in proximal tibia and securing screws are unremarkable. No interval change in appearance. No primary pathologic bone lesion is seen. There is mild subcutaneous stranding edema in the foreleg and ankle. There is moderate tricompartmental arthrosis of the knee and mild tibiotalar arthrosis. No soft tissue gas or foreign bodies are seen. The soft tissue edema was greater previously but in all other respects there are no further changes. Left foot three views: Since 03/25/2023, interval first ray amputation. There is mild-to-moderate edema in the foot. No soft tissue gas is seen. There is osteopenia without evidence of fractures. Hyperextension at the MTP joints. There are mild features of midfoot arthrosis. Small plantar and dorsal calcaneal spurring. There is no aggressive osseous lesion. Osteophytes superior talonavicular joint. IMPRESSION: 1. No evidence of acute chest disease. 2. Osteopenia and degenerative change without evidence of left tibiofibular or left foot fractures. 3. Mild subcutaneous stranding edema in the foreleg and  ankle. No soft tissue gas or foreign bodies are seen. Edema throughout the foot. 4. Interval left foot first ray amputation since 03/25/2023. 5. Aortic atherosclerosis. Electronically Signed   By: Almira Bar M.D.   On: 06/16/2023 00:23   DG Foot Complete Left Result Date: 06/16/2023 CLINICAL DATA:  Sepsis protocol, increased weakness and fever, incidental recent fall. Left foot and left foreleg injury. Left foot first ray amputation on 04/06/2023. EXAM: PORTABLE CHEST 1 VIEW LEFT TIBIA FIBULA 2 VIEWS LEFT FOOT 3 VIEWS COMPARISON:  CTA chest 03/08/2023, preoperative left foot AP and lateral 03/25/2023, left tibia fibula 03/07/2023. FINDINGS: Chest AP portable 11:39 p.m.: The heart size and mediastinal contours are within normal limits. There is aortic atherosclerosis. Multiple overlying monitor wires. Both lungs are clear. The visualized skeletal structures are unremarkable. AP and lateral left tibia fibula: There is mild osteopenia without evidence of fractures. Lateral sideplate ORIF fixation hardware in proximal tibia and securing screws are unremarkable. No interval change in appearance. No primary pathologic bone lesion is seen. There is mild subcutaneous stranding edema in the foreleg and ankle. There is moderate tricompartmental arthrosis of the knee and mild tibiotalar arthrosis. No soft tissue gas or foreign bodies are seen. The soft tissue edema was greater previously but in all other respects there are no further changes. Left foot three views: Since 03/25/2023, interval first ray amputation. There is mild-to-moderate edema in the foot. No soft tissue gas is seen. There is osteopenia without evidence of fractures. Hyperextension at the MTP joints. There are mild features of midfoot arthrosis. Small plantar and dorsal calcaneal spurring. There is no aggressive osseous lesion. Osteophytes superior talonavicular joint. IMPRESSION: 1. No evidence of acute chest disease. 2. Osteopenia and degenerative  change without evidence of left tibiofibular or left foot fractures. 3. Mild subcutaneous stranding edema in the foreleg and ankle. No soft tissue gas or foreign bodies are seen. Edema throughout the foot. 4. Interval left foot first ray amputation since 03/25/2023. 5. Aortic atherosclerosis. Electronically Signed   By: Almira Bar M.D.   On: 06/16/2023 00:23    Catarina Hartshorn, DO  [facility]  If 7PM-7AM, please contact night-coverage [email or web address] Password [email or web address]  06/16/2023, 10:58 AM   LOS: 0 days

## 2023-06-16 NOTE — Hospital Course (Addendum)
 63 year old male with a history of hypertension, diabetes mellitus type 2, hyperlipidemia, bipolar disorder, tobacco abuse (quit 1 yr ago), COPD, and TBI presenting from [facility] with fever, a fall and generalized weakness.  The patient himself cannot tell me exactly how long he has had fevers.  His history can be a little bit contradictory.  He relates a mechanical fall earlier in the week.  He denied any loss of consciousness or hitting his head.  He related 1 week history of pain in his left leg with some redness.  He has not noted any drainage.  He usually ambulates with a cane.  Apparently, he has had been having difficulty getting up because of worsening generalized weakness.  He complains of a cough but denies any shortness of breath, chest pain, headache, neck pain.  He denies any mopped assist.  He denies any nausea, vomiting, diarrhea, abdominal pain, hematochezia, melena.  There is no dysuria or hematuria.  He states that he has been wearing his support hose on his legs. The patient states that he quit smoking over a year ago.  He has a nearly 40-pack-year history. In the ED, the patient was febrile to 103.2 F with tachycardia.  He was hemodynamically stable.  Oxygen saturation was 86% room air.  He was placed on 4 L with saturation up to 93-94%.  Chest x-ray was negative for infiltrates.  X-ray of the left foot and left ankle were negative for fractures or dislocation. WBC 8.1, hemoglobin 10.8, platelets 181.  Sodium 135, potassium 3.8, bicarbonate 23, serum creatinine 1.97.  Lactic acid 2.5>> 1.8.  Lithium level 0.70.  EKG showed artifact with sinus rhythm.  COVID-19 PCR is negative.  UA neg for pyuria.  Patient was started on vancomycin and cefepime.  Notably, the patient had a recent hospital admission from 03/25/2023 to 04/02/2023 secondary to osteomyelitis of his left foot.  He underwent amputation of his left first ray by Dr. Lajoyce Corners.

## 2023-06-16 NOTE — Sepsis Progress Note (Signed)
 Elink following for sepsis protocol.

## 2023-06-16 NOTE — ED Notes (Signed)
 Pt called out stating that he spilled hot coffee on himself, tech went into room to clean patient up, provided full bed change because malewic was also leaking. Pt changed into new gown and malewic replaced, patient repositioned in bed and eating breakfast

## 2023-06-16 NOTE — Progress Notes (Signed)
 Pharmacy Antibiotic Note  Scott [name] is a 63 y.o. male admitted on 06/15/2023 with cellulitis.  Pharmacy has been consulted for cefepime and vancomycin dosing.  Plan: Cefepime 2g q12h  Vancomycin 1000mg  q24h (eAUC 540, Vd 0.5, Scr 1.97)  F/u renal function, infectious work up and length of therapy  Vancomycin levels as neeed  Height: 5\' 9"  (175.3 cm) Weight: 99.8 kg (220 lb) IBW/kg (Calculated) : 70.7  Temp (24hrs), Avg:101.8 F (38.8 C), Min:99.5 F (37.5 C), Max:103.2 F (39.6 C)  Recent Labs  Lab 06/15/23 2308 06/16/23 0106  WBC 8.1  --   CREATININE 1.97*  --   LATICACIDVEN 2.5* 1.8    Estimated Creatinine Clearance: 45.8 mL/min (A) (by C-G formula based on SCr of 1.97 mg/dL (H)).    No Known Allergies  Thank you for allowing pharmacy to be a part of this patient's care.  Marja Kays 06/16/2023 6:10 AM
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
## 2023-06-16 NOTE — Progress Notes (Signed)
 Patient had 2inch x3.5inch area of blanchable redness to sacrum upon arrival to floor. Mepilex placed for protection. Left leg with cellulitis, no drainage from healing amputation site.
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
## 2023-06-16 NOTE — Plan of Care (Signed)

## 2023-06-17 DIAGNOSIS — L03116 Cellulitis of left lower limb: Secondary | ICD-10-CM | POA: Diagnosis not present

## 2023-06-17 DIAGNOSIS — A419 Sepsis, unspecified organism: Secondary | ICD-10-CM | POA: Diagnosis not present

## 2023-06-17 DIAGNOSIS — J9601 Acute respiratory failure with hypoxia: Secondary | ICD-10-CM | POA: Diagnosis not present

## 2023-06-17 DIAGNOSIS — J181 Lobar pneumonia, unspecified organism: Secondary | ICD-10-CM | POA: Insufficient documentation

## 2023-06-17 LAB — RESPIRATORY PANEL BY PCR

## 2023-06-17 LAB — BASIC METABOLIC PANEL
Anion gap: 9 (ref 5–15)
Anion gap: 11 (ref 5–15)
BUN: 22 mg/dL (ref 8–23)
BUN: 32 mg/dL — ABNORMAL HIGH (ref 8–23)
CO2: 24 mmol/L (ref 22–32)
CO2: 19 mmol/L — ABNORMAL LOW (ref 22–32)
Calcium: 8.3 mg/dL — ABNORMAL LOW (ref 8.9–10.3)
Calcium: 8.3 mg/dL — ABNORMAL LOW (ref 8.9–10.3)
Chloride: 107 mmol/L (ref 98–111)
Chloride: 103 mmol/L (ref 98–111)
Creatinine, Ser: 1.71 mg/dL — ABNORMAL HIGH (ref 0.61–1.24)
Creatinine, Ser: 2.05 mg/dL — ABNORMAL HIGH (ref 0.61–1.24)
GFR, Estimated: 45 mL/min — ABNORMAL LOW (ref >60)
GFR, Estimated: 36 mL/min — ABNORMAL LOW (ref >60)
Glucose, Bld: 178 mg/dL — ABNORMAL HIGH (ref 70–99)
Glucose, Bld: 562 mg/dL (ref 70–99)
Potassium: 3.6 mmol/L (ref 3.5–5.1)
Potassium: 5.2 mmol/L — ABNORMAL HIGH (ref 3.5–5.1)
Sodium: 140 mmol/L (ref 135–145)
Sodium: 133 mmol/L — ABNORMAL LOW (ref 135–145)

## 2023-06-17 LAB — GLUCOSE, CAPILLARY
Glucose-Capillary: 157 mg/dL — ABNORMAL HIGH (ref 70–99)
Glucose-Capillary: 318 mg/dL — ABNORMAL HIGH (ref 70–99)
Glucose-Capillary: 552 mg/dL (ref 70–99)
Glucose-Capillary: 554 mg/dL (ref 70–99)
Glucose-Capillary: 454 mg/dL — ABNORMAL HIGH (ref 70–99)

## 2023-06-17 LAB — CBC
HCT: 36.3 % — ABNORMAL LOW (ref 39.0–52.0)
Hemoglobin: 11.2 g/dL — ABNORMAL LOW (ref 13.0–17.0)
MCH: 30.7 pg (ref 26.0–34.0)
MCHC: 30.9 g/dL (ref 30.0–36.0)
MCV: 99.5 fL (ref 80.0–100.0)
Platelets: 144 10*3/uL — ABNORMAL LOW (ref 150–400)
RBC: 3.65 MIL/uL — ABNORMAL LOW (ref 4.22–5.81)
RDW: 15.8 % — ABNORMAL HIGH (ref 11.5–15.5)
WBC: 8.6 10*3/uL (ref 4.0–10.5)
nRBC: 0 % (ref 0.0–0.2)

## 2023-06-17 LAB — MAGNESIUM: Magnesium: 1.8 mg/dL (ref 1.7–2.4)

## 2023-06-17 LAB — MRSA NEXT GEN BY PCR, NASAL: MRSA by PCR Next Gen: NOT DETECTED

## 2023-06-17 MED ORDER — ATORVASTATIN CALCIUM 40 MG PO TABS
40.0000 mg | ORAL_TABLET | Freq: Every day | ORAL | Status: DC
  Administered 2023-06-17 – 2023-06-18 (×2): 40 mg via ORAL
  Filled 2023-06-17 (×2): qty 1

## 2023-06-17 MED ORDER — LACTATED RINGERS IV SOLN: INTRAVENOUS | Status: AC

## 2023-06-17 MED ORDER — ENSURE ENLIVE PO LIQD
237.0000 mL | Freq: Two times a day (BID) | ORAL | Status: DC
  Administered 2023-06-17 – 2023-06-18 (×3): 237 mL via ORAL

## 2023-06-17 MED ORDER — LITHIUM CARBONATE 150 MG PO CAPS
300.0000 mg | ORAL_CAPSULE | Freq: Two times a day (BID) | ORAL | Status: DC
Start: 2023-06-17 — End: 2023-06-18
  Administered 2023-06-17 – 2023-06-18 (×3): 300 mg via ORAL
  Filled 2023-06-17 (×3): qty 2

## 2023-06-17 MED ORDER — AMLODIPINE BESYLATE 5 MG PO TABS
5.0000 mg | ORAL_TABLET | Freq: Every day | ORAL | Status: DC
  Administered 2023-06-17 – 2023-06-18 (×2): 5 mg via ORAL
  Filled 2023-06-17 (×2): qty 1

## 2023-06-17 MED ORDER — METHYLPREDNISOLONE SODIUM SUCC 125 MG IJ SOLR
60.0000 mg | Freq: Every day | INTRAMUSCULAR | Status: DC
  Administered 2023-06-18: 60 mg via INTRAVENOUS
  Filled 2023-06-17: qty 2

## 2023-06-17 MED ORDER — INSULIN ASPART 100 UNIT/ML IJ SOLN
0.0000 [IU] | Freq: Three times a day (TID) | INTRAMUSCULAR | Status: DC
  Administered 2023-06-17: 20 [IU] via SUBCUTANEOUS
  Administered 2023-06-18: 3 [IU] via SUBCUTANEOUS

## 2023-06-17 MED ORDER — SODIUM CHLORIDE 0.9 % IV BOLUS
1000.0000 mL | Freq: Once | INTRAVENOUS | Status: AC
  Administered 2023-06-17: 1000 mL via INTRAVENOUS

## 2023-06-17 MED ORDER — LACTATED RINGERS IV BOLUS
1000.0000 mL | Freq: Once | INTRAVENOUS | Status: AC
  Administered 2023-06-17: 1000 mL via INTRAVENOUS

## 2023-06-17 MED ORDER — SENNA 8.6 MG PO TABS
2.0000 | ORAL_TABLET | Freq: Every day | ORAL | Status: DC
  Administered 2023-06-17 – 2023-06-18 (×2): 17.2 mg via ORAL
  Filled 2023-06-17 (×2): qty 2

## 2023-06-17 MED ORDER — METOPROLOL TARTRATE 25 MG PO TABS
25.0000 mg | ORAL_TABLET | Freq: Two times a day (BID) | ORAL | Status: DC
  Administered 2023-06-17 – 2023-06-18 (×3): 25 mg via ORAL
  Filled 2023-06-17 (×4): qty 1

## 2023-06-17 MED ORDER — IPRATROPIUM-ALBUTEROL 0.5-2.5 (3) MG/3ML IN SOLN
3.0000 mL | Freq: Two times a day (BID) | RESPIRATORY_TRACT | Status: DC
  Administered 2023-06-17 – 2023-06-18 (×3): 3 mL via RESPIRATORY_TRACT
  Filled 2023-06-17 (×3): qty 3

## 2023-06-17 MED ORDER — DIVALPROEX SODIUM 250 MG PO DR TAB
1000.0000 mg | DELAYED_RELEASE_TABLET | Freq: Every day | ORAL | Status: DC
  Administered 2023-06-17 (×2): 1000 mg via ORAL
  Filled 2023-06-17 (×2): qty 4

## 2023-06-17 MED ORDER — INSULIN ASPART 100 UNIT/ML IJ SOLN
5.0000 [IU] | Freq: Three times a day (TID) | INTRAMUSCULAR | Status: DC
  Administered 2023-06-17 – 2023-06-18 (×2): 5 [IU] via SUBCUTANEOUS

## 2023-06-17 MED ORDER — INSULIN ASPART 100 UNIT/ML IJ SOLN
0.0000 [IU] | Freq: Every day | INTRAMUSCULAR | Status: DC
Start: 2023-06-17 — End: 2023-06-18
  Administered 2023-06-17: 5 [IU] via SUBCUTANEOUS

## 2023-06-17 MED ORDER — POLYETHYLENE GLYCOL 3350 17 G PO PACK
17.0000 g | PACK | Freq: Every day | ORAL | Status: DC
  Administered 2023-06-17 – 2023-06-18 (×2): 17 g via ORAL
  Filled 2023-06-17 (×2): qty 1

## 2023-06-17 MED ORDER — TAMSULOSIN HCL 0.4 MG PO CAPS
0.4000 mg | ORAL_CAPSULE | Freq: Every day | ORAL | Status: DC
  Administered 2023-06-17: 0.4 mg via ORAL
  Filled 2023-06-17: qty 1

## 2023-06-17 MED ORDER — RISPERIDONE 1 MG PO TABS
2.0000 mg | ORAL_TABLET | Freq: Every day | ORAL | Status: DC
  Administered 2023-06-17 – 2023-06-18 (×2): 2 mg via ORAL
  Filled 2023-06-17: qty 4
  Filled 2023-06-17 (×2): qty 2

## 2023-06-17 MED ORDER — DIVALPROEX SODIUM 250 MG PO DR TAB
500.0000 mg | DELAYED_RELEASE_TABLET | Freq: Every day | ORAL | Status: DC
  Administered 2023-06-18: 500 mg via ORAL
  Filled 2023-06-17 (×2): qty 2

## 2023-06-17 MED ORDER — METHYLPREDNISOLONE SODIUM SUCC 125 MG IJ SOLR
60.0000 mg | Freq: Two times a day (BID) | INTRAMUSCULAR | Status: DC
  Administered 2023-06-17: 60 mg via INTRAVENOUS
  Filled 2023-06-17: qty 2

## 2023-06-17 NOTE — Progress Notes (Signed)
 Patient's sat was in 80s on 2L, had to take patient up to 4L to get him above 90%.

## 2023-06-17 NOTE — Progress Notes (Signed)
 SATURATION QUALIFICATIONS: (This note is used to comply with regulatory documentation for home oxygen)   Patient Saturations on Room Air at Rest = 88   Patient Saturations on Room Air while Ambulating = 85   Patient Saturations on 2 Liters of oxygen while Ambulating = 94   Please briefly explain why patient needs home oxygen: To maintain 02 sat at 90% or above during ambulation.   Scott Hartshorn, DO

## 2023-06-17 NOTE — TOC Progression Note (Addendum)
 Transition of Care [facility]) - Progression Note    Patient Details  Name: Scott [name] MRN: [id number] Date of Birth: [birth year]  Transition of Care [facility]) CM/SW Contact  Villa Herb, [place] Phone Number: 06/17/2023, 10:34 AM  Clinical Narrative:    CSW spoke to Tammy with [facility] who states pt no longer wears O2 at the facility and this will need to be set back up with [place] if needed. Pt was walking independently prior to coming into the hospital. CSW to request PT/OT eval. TOC to follow.   CSW reviewed PT eval with Nettie Elm at [facility] who states they will be able to accept pt back tomorrow. Pt has used Archivist for [facility] RN in the past. Nettie Elm states [facility] PT/RN referral can be given to them. TOC to follow.   Expected Discharge Plan: Assisted Living Barriers to Discharge: Continued Medical Work up  Expected Discharge Plan and Services                                               Social Determinants of Health (SDOH) Interventions SDOH Screenings   Food Insecurity: No Food Insecurity (06/16/2023)  Housing: Low Risk  (06/16/2023)  Transportation Needs: No Transportation Needs (06/16/2023)  Utilities: Not At Risk (06/16/2023)  Tobacco Use: Low Risk  (06/15/2023)    Readmission Risk Interventions    03/26/2023   12:38 PM 02/12/2023   10:56 AM  Readmission Risk Prevention Plan  Transportation Screening Complete Complete  PCP or Specialist Appt within 3-5 Days Not Complete   HRI or Home Care Consult Complete Complete  Social Work Consult for Recovery Care Planning/Counseling Complete Complete  Palliative Care Screening Not Applicable Not Applicable  Medication Review Oceanographer) Complete Complete

## 2023-06-17 NOTE — Progress Notes (Addendum)
 PROGRESS NOTE  Scott [name] ZOX:[id number] DOB: [birth year] DOA: 06/15/2023 PCP: Benetta Spar, MD  Brief History:  63 year old male with a history of hypertension, diabetes mellitus type 2, hyperlipidemia, bipolar disorder, tobacco abuse (quit 1 yr ago), COPD, and TBI presenting from [facility] with fever, a fall and generalized weakness.  The patient himself cannot tell me exactly how long he has had fevers.  His history can be a little bit contradictory.  He relates a mechanical fall earlier in the week.  He denied any loss of consciousness or hitting his head.  He related 1 week history of pain in his left leg with some redness.  He has not noted any drainage.  He usually ambulates with a cane.  Apparently, he has had been having difficulty getting up because of worsening generalized weakness.  He complains of a cough but denies any shortness of breath, chest pain, headache, neck pain.  He denies any mopped assist.  He denies any nausea, vomiting, diarrhea, abdominal pain, hematochezia, melena.  There is no dysuria or hematuria.  He states that he has been wearing his support hose on his legs. The patient states that he quit smoking over a year ago.  He has a nearly 40-pack-year history. In the ED, the patient was febrile to 103.2 F with tachycardia.  He was hemodynamically stable.  Oxygen saturation was 86% room air.  He was placed on 4 L with saturation up to 93-94%.  Chest x-ray was negative for infiltrates.  X-ray of the left foot and left ankle were negative for fractures or dislocation. WBC 8.1, hemoglobin 10.8, platelets 181.  Sodium 135, potassium 3.8, bicarbonate 23, serum creatinine 1.97.  Lactic acid 2.5>> 1.8.  Lithium level 0.70.  EKG showed artifact with sinus rhythm.  COVID-19 PCR is negative.  UA neg for pyuria.  Patient was started on vancomycin and cefepime.  Notably, the patient had a recent hospital admission from 03/25/2023 to 04/02/2023 secondary to  osteomyelitis of his left foot.  He underwent amputation of his left first ray by Dr. Lajoyce Corners.   Assessment/Plan: Severe sepsis -Present on admission -Secondary to pneumonia and possible cellulitis -Lactic acid peaked 2.5>> 1.6 -Check PCT 0.41 -Continue IV fluids -Continue vancomycin and cefepime pending culture data -UA--no pyuria -blood cultures remain neg   Acute respiratory failure with hypoxia -Personally reviewed chest x-ray--increased interstitial markings without frank consolidation -3/9 CT chest--Patchy irregular bandlike consolidation with some air Bronchograms LLL and RUL/RLL -continue doxy -Stable on 4 L initially -Wean oxygen for saturation greater 92% -3/9 VBG 7.37/55/<31/31  Lobar pneumonia -CT chest and antibiotics as discussed above   COPD exacerbation -Started Brovana -Started Pulmicort -Started DuoNebs -COVID-19 PCR negative/influenza negative -Check viral respiratory panel -start solumedrol   Cellulitis left lower extremity -See pictures below -Good palpable pulses -MRI leg and foot if no improvement or persistent fever   CKD 3b -Baseline creatinine 1.6-1.8 -Continue IV fluids -Holding furosemide   Uncontrolled diabetes mellitus type 2 with hyperglycemia -NovoLog sliding scale -05/20/2023 hemoglobin A1c 8.4 -Holding glipizide and tradjenta -started semglee 10 units daily   Hx of osteomyelitis left hallux -See pictures below -s/p left first ray amputation 03/29/23--Dr. Lajoyce Corners   Essential hypertension -Hold amlodipine>>restart -restart metoprolol -monitor clinically   Mixed hyperlipidemia -Continue statin   Bipolar disorder -Continue valproic acid, lithium, benztropine, respiradone     Family Communication:  no  Family at bedside   Consultants:  none   Code  Status:  FULL   DVT Prophylaxis: Steele Lovenox     Procedures: As Listed in Progress Note Above   Antibiotics: Vanc 3/8>> Cefepime 3/8>> Doxy  3/8>>          Subjective: Pt complains of cough and some sob.  Denies n/v/d, abd pain or leg or foot pain.  Has cp with cough.  Objective: Vitals:   06/16/23 1953 06/17/23 0603 06/17/23 0608 06/17/23 0805  BP: (!) 141/72 (!) 154/87 (!) 154/87 (!) 144/79  Pulse: 96 95 95 90  Resp: 18     Temp: 99 F (37.2 C) 98.1 F (36.7 C)    TempSrc: Oral Oral    SpO2: 96% 90% 93% 92%  Weight:      Height:        Intake/Output Summary (Last 24 hours) at 06/17/2023 0817 Last data filed at 06/17/2023 0654 Gross per 24 hour  Intake 1678.81 ml  Output 4400 ml  Net -2721.19 ml   Weight change:  Exam:  General:  Pt is alert, follows commands appropriately, not in acute distress HEENT: No icterus, No thrush, No neck mass, [place]/AT Cardiovascular: RRR, S1/S2, no rubs, no gallops Respiratory: bilateral rales.  Diminished BS.  Mild basilar wheeze Abdomen: Soft/+BS, non tender, non distended, no guarding Extremities: No edema, No lymphangitis, No petechiae, No rashes, no synovitis; see pics of left leg below        Data Reviewed: I have personally reviewed following labs and imaging studies Basic Metabolic Panel: Recent Labs  Lab 06/15/23 2308 06/16/23 0628 06/17/23 0316  NA 135  --  140  K 3.8  --  3.6  CL 102  --  107  CO2 23  --  24  GLUCOSE 218*  --  178*  BUN 32*  --  22  CREATININE 1.97*  --  1.71*  CALCIUM 8.8*  --  8.3*  MG  --  1.9 1.8  PHOS  --  2.4*  --    Liver Function Tests: Recent Labs  Lab 06/15/23 2308  AST 22  ALT 20  ALKPHOS 73  BILITOT 0.4  PROT 7.0  ALBUMIN 2.9*   No results for input(s): "LIPASE", "AMYLASE" in the last 168 hours. No results for input(s): "AMMONIA" in the last 168 hours. Coagulation Profile: Recent Labs  Lab 06/15/23 2308  INR 1.0   CBC: Recent Labs  Lab 06/15/23 2308 06/17/23 0316  WBC 8.1 8.6  NEUTROABS 5.7  --   HGB 10.8* 11.2*  HCT 34.1* 36.3*  MCV 96.3 99.5  PLT 181 144*   Cardiac Enzymes: No results  for input(s): "CKTOTAL", "CKMB", "CKMBINDEX", "TROPONINI" in the last 168 hours. BNP: Invalid input(s): "POCBNP" CBG: Recent Labs  Lab 06/16/23 0714 06/16/23 1148 06/16/23 1609 06/16/23 2202 06/17/23 0732  GLUCAP 83 128* 140* 167* 157*   HbA1C: No results for input(s): "HGBA1C" in the last 72 hours. Urine analysis:    Component Value Date/Time   COLORURINE STRAW (A) 06/15/2023 2308   APPEARANCEUR CLEAR 06/15/2023 2308   APPEARANCEUR Clear 06/12/2023 0928   LABSPEC 1.005 06/15/2023 2308   PHURINE 6.0 06/15/2023 2308   GLUCOSEU >=500 (A) 06/15/2023 2308   HGBUR NEGATIVE 06/15/2023 2308   BILIRUBINUR NEGATIVE 06/15/2023 2308   BILIRUBINUR Negative 06/12/2023 0928   KETONESUR NEGATIVE 06/15/2023 2308   PROTEINUR 100 (A) 06/15/2023 2308   UROBILINOGEN 0.2 07/17/2011 2014   NITRITE NEGATIVE 06/15/2023 2308   LEUKOCYTESUR NEGATIVE 06/15/2023 2308   Sepsis Labs: @LABRCNTIP (procalcitonin:4,lacticidven:4) ) Recent Results (from  the past 240 hours)  Microscopic Examination     Status: None   Collection Time: 06/12/23  9:28 AM   Urine  Result Value Ref Range Status   WBC, UA 0-5 0 - 5 /hpf Final   RBC, Urine None seen 0 - 2 /hpf Final   Epithelial Cells (non renal) 0-10 0 - 10 /hpf Final   Bacteria, UA None seen None seen/Few Final  Culture, blood (Routine x 2)     Status: None (Preliminary result)   Collection Time: 06/15/23 11:08 PM   Specimen: BLOOD  Result Value Ref Range Status   Specimen Description BLOOD BLOOD LEFT ARM  Final   Special Requests NONE  Final   Culture   Final    NO GROWTH 2 DAYS Performed at [facility], [address], [place], [place] [address]    Report Status PENDING  Incomplete  Resp panel by RT-PCR (RSV, Flu A&B, Covid) Anterior Nasal Swab     Status: None   Collection Time: 06/15/23 11:08 PM   Specimen: Anterior Nasal Swab  Result Value Ref Range Status   SARS Coronavirus 2 by RT PCR NEGATIVE NEGATIVE Final    Comment: (NOTE) SARS-CoV-2  target nucleic acids are NOT DETECTED.  The SARS-CoV-2 RNA is generally detectable in upper respiratory specimens during the acute phase of infection. The lowest concentration of SARS-CoV-2 viral copies this assay can detect is 138 copies/mL. A negative result does not preclude SARS-Cov-2 infection and should not be used as the sole basis for treatment or other patient management decisions. A negative result may occur with  improper specimen collection/handling, submission of specimen other than nasopharyngeal swab, presence of viral mutation(s) within the areas targeted by this assay, and inadequate number of viral copies(<138 copies/mL). A negative result must be combined with clinical observations, patient history, and epidemiological information. The expected result is Negative.  Fact Sheet for Patients:  BloggerCourse.com  Fact Sheet for Healthcare Providers:  SeriousBroker.it  This test is no t yet approved or cleared by the [place] FDA and  has been authorized for detection and/or diagnosis of SARS-CoV-2 by FDA under an Emergency Use Authorization (EUA). This EUA will remain  in effect (meaning this test can be used) for the duration of the COVID-19 declaration under Section 564(b)(1) of the Act, 21 U.S.C.section 360bbb-3(b)(1), unless the authorization is terminated  or revoked sooner.       Influenza A by PCR NEGATIVE NEGATIVE Final   Influenza B by PCR NEGATIVE NEGATIVE Final    Comment: (NOTE) The Xpert Xpress SARS-CoV-2/FLU/RSV plus assay is intended as an aid in the diagnosis of influenza from Nasopharyngeal swab specimens and should not be used as a sole basis for treatment. Nasal washings and aspirates are unacceptable for Xpert Xpress SARS-CoV-2/FLU/RSV testing.  Fact Sheet for Patients: BloggerCourse.com  Fact Sheet for Healthcare  Providers: SeriousBroker.it  This test is not yet approved or cleared by the [place] FDA and has been authorized for detection and/or diagnosis of SARS-CoV-2 by FDA under an Emergency Use Authorization (EUA). This EUA will remain in effect (meaning this test can be used) for the duration of the COVID-19 declaration under Section 564(b)(1) of the Act, 21 U.S.C. section 360bbb-3(b)(1), unless the authorization is terminated or revoked.     Resp Syncytial Virus by PCR NEGATIVE NEGATIVE Final    Comment: (NOTE) Fact Sheet for Patients: BloggerCourse.com  Fact Sheet for Healthcare Providers: SeriousBroker.it  This test is not yet approved or cleared by the  [place] [employer] trader and has been authorized for detection and/or diagnosis of SARS-CoV-2 by FDA under an TEFL teacher (EUA). This EUA will remain in effect (meaning this test can be used) for the duration of the COVID-19 declaration under Section 564(b)(1) of the Act, 21 U.S.C. section 360bbb-3(b)(1), unless the authorization is terminated or revoked.  Performed at [facility], [address], [place], [place] [address]   Culture, blood (Routine x 2)     Status: None (Preliminary result)   Collection Time: 06/15/23 11:28 PM   Specimen: BLOOD LEFT HAND  Result Value Ref Range Status   Specimen Description BLOOD LEFT HAND  Final   Special Requests NONE  Final   Culture   Final    NO GROWTH 2 DAYS Performed at [facility], [address] [place], [place] [address]    Report Status PENDING  Incomplete     Scheduled Meds:  amLODipine  5 mg Oral Daily   arformoterol  15 mcg Nebulization BID   atorvastatin  40 mg Oral Daily   budesonide (PULMICORT) nebulizer solution  0.5 mg Nebulization BID   divalproex  1,000 mg Oral QHS   divalproex  500 mg Oral Daily   doxycycline  100 mg Oral Q12H   enoxaparin (LOVENOX) injection  40 mg  Subcutaneous Q24H   feeding supplement  237 mL Oral BID BM   insulin aspart  0-5 Units Subcutaneous QHS   insulin aspart  0-9 Units Subcutaneous TID WC   insulin glargine-yfgn  10 Units Subcutaneous Daily   ipratropium-albuterol  3 mL Nebulization BID   lithium carbonate  300 mg Oral BID WC   metoprolol tartrate  25 mg Oral BID   polyethylene glycol  17 g Oral Daily   risperiDONE  2 mg Oral Daily   senna  2 tablet Oral Daily   tamsulosin  0.4 mg Oral QPC supper   Continuous Infusions:  ceFEPime (MAXIPIME) IV Stopped (06/17/23 0136)   vancomycin Stopped (06/17/23 0255)    Procedures/Studies: CT CHEST WO CONTRAST Result Date: 06/16/2023 CLINICAL DATA:  Respiratory illness, nondiagnostic xray EXAM: CT CHEST WITHOUT CONTRAST TECHNIQUE: Multidetector CT imaging of the chest was performed following the standard protocol without IV contrast. RADIATION DOSE REDUCTION: This exam was performed according to the departmental dose-optimization program which includes automated exposure control, adjustment of the mA and/or kV according to patient size and/or use of iterative reconstruction technique. COMPARISON:  Chest radiograph from one day prior. 03/08/2023 chest CT angiogram. FINDINGS: Cardiovascular: Normal heart size. No significant pericardial effusion/thickening. Left anterior descending coronary atherosclerosis. Mildly atherosclerotic nonaneurysmal thoracic aorta. Normal caliber pulmonary arteries. Mediastinum/Nodes: No significant thyroid nodules. Unremarkable esophagus. No axillary adenopathy. Mildly enlarged 1.0 cm right paratracheal node on series 2/image 67 and mildly enlarged 1.2 cm right hilar node on series 2/image 76, new. No discrete left hilar adenopathy on these noncontrast images. Lungs/Pleura: No pneumothorax. No pleural effusion. Mild centrilobular and paraseptal emphysema with diffuse bronchial wall thickening. Patchy irregular bandlike consolidation with some air bronchograms and mild  volume loss in the left lower lobe and less prominently in the posterior right upper lobe and peripheral right lower lobe. No lung masses or significant pulmonary nodules in the aerated portions of the lungs. Upper abdomen: Cholelithiasis. Musculoskeletal: No aggressive appearing focal osseous lesions. Mild thoracic spondylosis. IMPRESSION: 1. Patchy irregular bandlike consolidation with some air bronchograms and mild volume loss in the left lower lobe and less prominently in the posterior right upper lobe and peripheral right lower  lobe, favoring multilobar pneumonia with some atelectasis. 2. Mild mediastinal and right hilar lymphadenopathy, new, probably reactive. 3. Suggest follow-up chest CT with IV contrast in 3 months to document resolution given patient risk factors. 4. One vessel coronary atherosclerosis. 5. Cholelithiasis. 6. Aortic Atherosclerosis (ICD10-I70.0) and Emphysema (ICD10-J43.9). Electronically Signed   By: Delbert Phenix M.D.   On: 06/16/2023 11:35   DG Chest Portable 1 View Result Date: 06/16/2023 CLINICAL DATA:  Sepsis protocol, increased weakness and fever, incidental recent fall. Left foot and left foreleg injury. Left foot first ray amputation on 04/06/2023. EXAM: PORTABLE CHEST 1 VIEW LEFT TIBIA FIBULA 2 VIEWS LEFT FOOT 3 VIEWS COMPARISON:  CTA chest 03/08/2023, preoperative left foot AP and lateral 03/25/2023, left tibia fibula 03/07/2023. FINDINGS: Chest AP portable 11:39 p.m.: The heart size and mediastinal contours are within normal limits. There is aortic atherosclerosis. Multiple overlying monitor wires. Both lungs are clear. The visualized skeletal structures are unremarkable. AP and lateral left tibia fibula: There is mild osteopenia without evidence of fractures. Lateral sideplate ORIF fixation hardware in proximal tibia and securing screws are unremarkable. No interval change in appearance. No primary pathologic bone lesion is seen. There is mild subcutaneous stranding edema in  the foreleg and ankle. There is moderate tricompartmental arthrosis of the knee and mild tibiotalar arthrosis. No soft tissue gas or foreign bodies are seen. The soft tissue edema was greater previously but in all other respects there are no further changes. Left foot three views: Since 03/25/2023, interval first ray amputation. There is mild-to-moderate edema in the foot. No soft tissue gas is seen. There is osteopenia without evidence of fractures. Hyperextension at the MTP joints. There are mild features of midfoot arthrosis. Small plantar and dorsal calcaneal spurring. There is no aggressive osseous lesion. Osteophytes superior talonavicular joint. IMPRESSION: 1. No evidence of acute chest disease. 2. Osteopenia and degenerative change without evidence of left tibiofibular or left foot fractures. 3. Mild subcutaneous stranding edema in the foreleg and ankle. No soft tissue gas or foreign bodies are seen. Edema throughout the foot. 4. Interval left foot first ray amputation since 03/25/2023. 5. Aortic atherosclerosis. Electronically Signed   By: Almira Bar M.D.   On: 06/16/2023 00:23   DG Tibia/Fibula Left Result Date: 06/16/2023 CLINICAL DATA:  Sepsis protocol, increased weakness and fever, incidental recent fall. Left foot and left foreleg injury. Left foot first ray amputation on 04/06/2023. EXAM: PORTABLE CHEST 1 VIEW LEFT TIBIA FIBULA 2 VIEWS LEFT FOOT 3 VIEWS COMPARISON:  CTA chest 03/08/2023, preoperative left foot AP and lateral 03/25/2023, left tibia fibula 03/07/2023. FINDINGS: Chest AP portable 11:39 p.m.: The heart size and mediastinal contours are within normal limits. There is aortic atherosclerosis. Multiple overlying monitor wires. Both lungs are clear. The visualized skeletal structures are unremarkable. AP and lateral left tibia fibula: There is mild osteopenia without evidence of fractures. Lateral sideplate ORIF fixation hardware in proximal tibia and securing screws are unremarkable. No  interval change in appearance. No primary pathologic bone lesion is seen. There is mild subcutaneous stranding edema in the foreleg and ankle. There is moderate tricompartmental arthrosis of the knee and mild tibiotalar arthrosis. No soft tissue gas or foreign bodies are seen. The soft tissue edema was greater previously but in all other respects there are no further changes. Left foot three views: Since 03/25/2023, interval first ray amputation. There is mild-to-moderate edema in the foot. No soft tissue gas is seen. There is osteopenia without evidence of fractures. Hyperextension at the MTP  joints. There are mild features of midfoot arthrosis. Small plantar and dorsal calcaneal spurring. There is no aggressive osseous lesion. Osteophytes superior talonavicular joint. IMPRESSION: 1. No evidence of acute chest disease. 2. Osteopenia and degenerative change without evidence of left tibiofibular or left foot fractures. 3. Mild subcutaneous stranding edema in the foreleg and ankle. No soft tissue gas or foreign bodies are seen. Edema throughout the foot. 4. Interval left foot first ray amputation since 03/25/2023. 5. Aortic atherosclerosis. Electronically Signed   By: Almira Bar M.D.   On: 06/16/2023 00:23   DG Foot Complete Left Result Date: 06/16/2023 CLINICAL DATA:  Sepsis protocol, increased weakness and fever, incidental recent fall. Left foot and left foreleg injury. Left foot first ray amputation on 04/06/2023. EXAM: PORTABLE CHEST 1 VIEW LEFT TIBIA FIBULA 2 VIEWS LEFT FOOT 3 VIEWS COMPARISON:  CTA chest 03/08/2023, preoperative left foot AP and lateral 03/25/2023, left tibia fibula 03/07/2023. FINDINGS: Chest AP portable 11:39 p.m.: The heart size and mediastinal contours are within normal limits. There is aortic atherosclerosis. Multiple overlying monitor wires. Both lungs are clear. The visualized skeletal structures are unremarkable. AP and lateral left tibia fibula: There is mild osteopenia without  evidence of fractures. Lateral sideplate ORIF fixation hardware in proximal tibia and securing screws are unremarkable. No interval change in appearance. No primary pathologic bone lesion is seen. There is mild subcutaneous stranding edema in the foreleg and ankle. There is moderate tricompartmental arthrosis of the knee and mild tibiotalar arthrosis. No soft tissue gas or foreign bodies are seen. The soft tissue edema was greater previously but in all other respects there are no further changes. Left foot three views: Since 03/25/2023, interval first ray amputation. There is mild-to-moderate edema in the foot. No soft tissue gas is seen. There is osteopenia without evidence of fractures. Hyperextension at the MTP joints. There are mild features of midfoot arthrosis. Small plantar and dorsal calcaneal spurring. There is no aggressive osseous lesion. Osteophytes superior talonavicular joint. IMPRESSION: 1. No evidence of acute chest disease. 2. Osteopenia and degenerative change without evidence of left tibiofibular or left foot fractures. 3. Mild subcutaneous stranding edema in the foreleg and ankle. No soft tissue gas or foreign bodies are seen. Edema throughout the foot. 4. Interval left foot first ray amputation since 03/25/2023. 5. Aortic atherosclerosis. Electronically Signed   By: Almira Bar M.D.   On: 06/16/2023 00:23    Catarina Hartshorn, DO  [facility]  If 7PM-7AM, please contact night-coverage [email or web address] Password [email or web address] 06/17/2023, 8:17 AM   LOS: 1 day

## 2023-06-17 NOTE — Plan of Care (Signed)
  Problem: Education: Goal: Ability to describe self-care measures that may prevent or decrease complications (Diabetes Survival Skills Education) will improve Outcome: Progressing Goal: Individualized Educational Video(s) Outcome: Progressing   Problem: Fluid Volume: Goal: Ability to maintain a balanced intake and output will improve Outcome: Progressing   Problem: Health Behavior/Discharge Planning: Goal: Ability to identify and utilize available resources and services will improve Outcome: Progressing Goal: Ability to manage health-related needs will improve Outcome: Progressing   Problem: Metabolic: Goal: Ability to maintain appropriate glucose levels will improve Outcome: Progressing   Problem: Nutritional: Goal: Maintenance of adequate nutrition will improve Outcome: Progressing Goal: Progress toward achieving an optimal weight will improve Outcome: Progressing

## 2023-06-17 NOTE — Evaluation (Signed)
 Physical Therapy Evaluation Patient Details Name: Scott [name] MRN: [id number] DOB: Jan 11, 1961 Today's Date: 06/17/2023  History of Present Illness  Scott [name] is a 63 y.o. male with medical history significant of hyperlipidemia, type 2 diabetes mellitus, COPD, bipolar disorder and TBI who presented to the emergency department due to fever and accidental fall.  Patient states that he slid down and landed on his buttocks, he complained of generalized weakness, but denies any head injury or loss of consciousness.  He was recently admitted from 03/25/2023 to 04/02/2023 due to acute osteomyelitis of the metatarsal bone of left foot during which orthopedic (Dr. Lajoyce Corners) was consulted and patient had left foot first ray amputation on 03/29/2023.   Clinical Impression  Pt demonstrates difficulty with bed mobility (supine to sit) requiring +1 mod assist for completion. Pt also demonstrated decreased O2 saturation during functional movements and transfers on RA. Pt went from 96% SpO2 on 2 Lpm to 87%SpO2 on RA). Pt's SpO2 was stable on 2 Lpm during ambulation to doorway and back. Pt would continue to benefit from skilled acute physical therapy for increased fatigue resistance, increased LE strength, and decreased fall risk for return to [facility] and meet therapy goals.      If plan is discharge home, recommend the following: A lot of help with walking and/or transfers;A little help with bathing/dressing/bathroom;Help with stairs or ramp for entrance;Assistance with cooking/housework   Can travel by private vehicle   No    Equipment Recommendations Rolling walker (2 wheels)  Recommendations for Other Services       Functional Status Assessment Patient has had a recent decline in their functional status and demonstrates the ability to make significant improvements in function in a reasonable and predictable amount of time.     Precautions / Restrictions Precautions Precautions:  Fall Restrictions Weight Bearing Restrictions Per Provider Order: No      Mobility  Bed Mobility Overal bed mobility: Needs Assistance Bed Mobility: Supine to Sit     Supine to sit: Min assist     General bed mobility comments: Pt moved with decreased speed and need for one UE support from therapist    Transfers Overall transfer level: Needs assistance Equipment used: Rolling walker (2 wheels) Transfers: Sit to/from Stand Sit to Stand: Min assist           General transfer comment: slow and needs assistance reaching full standing position    Ambulation/Gait Ambulation/Gait assistance: Min assist, Mod assist Gait Distance (Feet): 22 Feet Assistive device: Rolling walker (2 wheels) Gait Pattern/deviations: Decreased stride length, Decreased step length - right, Decreased step length - left, Shuffle Gait velocity: slow     General Gait Details: bilateral LE weakness  Stairs            Wheelchair Mobility     Tilt Bed    Modified Rankin (Stroke Patients Only)       Balance Overall balance assessment: Needs assistance Sitting-balance support: Feet supported, No upper extremity supported Sitting balance-Leahy Scale: Fair Sitting balance - Comments: seated at EOB Postural control: Posterior lean Standing balance support: Reliant on assistive device for balance, During functional activity, Bilateral upper extremity supported Standing balance-Leahy Scale: Fair Standing balance comment: fair/poor using RW                             Pertinent Vitals/Pain Pain Assessment Pain Assessment: No/denies pain    Home Living Family/patient expects to be discharged  to:: Assisted living                 Home Equipment: Gilmer Mor - single point      Prior Function Prior Level of Function : Needs assist       Physical Assist : Mobility (physical);ADLs (physical) Mobility (physical): Bed mobility;Transfers;Gait;Stairs   Mobility Comments:  Household ambulation with out AD ADLs Comments: Assisted by ALF staff     Extremity/Trunk Assessment   Upper Extremity Assessment Upper Extremity Assessment: Generalized weakness    Lower Extremity Assessment Lower Extremity Assessment: Generalized weakness    Cervical / Trunk Assessment Cervical / Trunk Assessment: Kyphotic  Communication   Communication Communication: No apparent difficulties    Cognition Arousal: Alert Behavior During Therapy: WFL for tasks assessed/performed   PT - Cognitive impairments: No apparent impairments                         Following commands: Intact       Cueing Cueing Techniques: Verbal cues, Tactile cues     General Comments      Exercises     Assessment/Plan    PT Assessment Patient needs continued PT services  PT Problem List Decreased strength;Decreased activity tolerance;Decreased balance;Decreased mobility;Cardiopulmonary status limiting activity       PT Treatment Interventions DME instruction;Gait training;Stair training;Functional mobility training;Therapeutic activities;Therapeutic exercise;Balance training;Patient/family education    PT Goals (Current goals can be found in the Care Plan section)  Acute Rehab PT Goals Patient Stated Goal: return to home PT Goal Formulation: With patient Time For Goal Achievement: 07/01/23 Potential to Achieve Goals: Good    Frequency Min 3X/week     Co-evaluation               AM-PAC PT "6 Clicks" Mobility  Outcome Measure Help needed turning from your back to your side while in a flat bed without using bedrails?: A Lot Help needed moving from lying on your back to sitting on the side of a flat bed without using bedrails?: A Lot Help needed moving to and from a bed to a chair (including a wheelchair)?: A Lot Help needed standing up from a chair using your arms (e.g., wheelchair or bedside chair)?: A Lot Help needed to walk in hospital room?: A Lot Help  needed climbing 3-5 steps with a railing? : Total 6 Click Score: 11    End of Session Equipment Utilized During Treatment: Gait belt;Oxygen Activity Tolerance: Patient tolerated treatment well;Patient limited by fatigue Patient left: in chair;with call bell/phone within reach;with chair alarm set Nurse Communication: Mobility status PT Visit Diagnosis: Unsteadiness on feet (R26.81);Other abnormalities of gait and mobility (R26.89);Muscle weakness (generalized) (M62.81)    Time: 8119-1478 PT Time Calculation (min) (ACUTE ONLY): 28 min   Charges:   PT Evaluation $PT Eval Moderate Complexity: 1 Mod PT Treatments $Therapeutic Activity: 23-37 mins PT General Charges $$ ACUTE PT VISIT: 1 Visit         Luz Lex, PT, DPT [facility] Office: [phone]

## 2023-06-17 NOTE — Plan of Care (Signed)
  Problem: Acute Rehab PT Goals(only PT should resolve) Goal: Pt Will Go Supine/Side To Sit Outcome: Progressing Flowsheets (Taken 06/17/2023 1454) Pt will go Supine/Side to Sit:  with contact guard assist  with minimal assist Goal: Patient Will Transfer Sit To/From Stand Outcome: Progressing Flowsheets (Taken 06/17/2023 1454) Patient will transfer sit to/from stand:  with contact guard assist  with minimal assist Goal: Pt Will Transfer Bed To Chair/Chair To Bed Outcome: Progressing Flowsheets (Taken 06/17/2023 1454) Pt will Transfer Bed to Chair/Chair to Bed:  with contact guard assist  with min assist Goal: Pt Will Ambulate Outcome: Progressing Flowsheets (Taken 06/17/2023 1454) Pt will Ambulate:  50 feet  with contact guard assist   Luz Lex, PT, DPT [facility] Office: [phone]

## 2023-06-17 NOTE — Progress Notes (Signed)
   06/17/23 1614  Provider Notification  Provider Name/Title Dr. Arbutus Leas  Date Provider Notified 06/17/23  Time Provider Notified 1614  Method of Notification Page  Notification Reason Critical Result  Test performed and critical result CBG 552  Date Critical Result Received 06/17/23  Time Critical Result Received 1614  Provider response See new orders  Date of Provider Response 06/17/23  Time of Provider Response 1614

## 2023-06-17 NOTE — Progress Notes (Signed)
   06/17/23 1804  Provider Notification  Provider Name/Title Dr.Tat  Date Provider Notified 06/17/23  Time Provider Notified 1804  Method of Notification Page  Notification Reason Critical Result  Test performed and critical result glucose 554  Date Critical Result Received 06/17/23  Time Critical Result Received 1804  Provider response See new orders  Date of Provider Response 06/17/23  Time of Provider Response [phone]

## 2023-06-18 ENCOUNTER — Telehealth: Payer: Self-pay | Admitting: Nurse Practitioner

## 2023-06-18 DIAGNOSIS — F17200 Nicotine dependence, unspecified, uncomplicated: Secondary | ICD-10-CM | POA: Diagnosis not present

## 2023-06-18 DIAGNOSIS — A419 Sepsis, unspecified organism: Secondary | ICD-10-CM | POA: Diagnosis not present

## 2023-06-18 DIAGNOSIS — J9601 Acute respiratory failure with hypoxia: Secondary | ICD-10-CM | POA: Diagnosis not present

## 2023-06-18 DIAGNOSIS — E1122 Type 2 diabetes mellitus with diabetic chronic kidney disease: Secondary | ICD-10-CM | POA: Diagnosis not present

## 2023-06-18 DIAGNOSIS — L0390 Cellulitis, unspecified: Secondary | ICD-10-CM

## 2023-06-18 DIAGNOSIS — I129 Hypertensive chronic kidney disease with stage 1 through stage 4 chronic kidney disease, or unspecified chronic kidney disease: Secondary | ICD-10-CM | POA: Diagnosis not present

## 2023-06-18 DIAGNOSIS — E1165 Type 2 diabetes mellitus with hyperglycemia: Secondary | ICD-10-CM | POA: Diagnosis not present

## 2023-06-18 DIAGNOSIS — L03116 Cellulitis of left lower limb: Secondary | ICD-10-CM | POA: Diagnosis not present

## 2023-06-18 DIAGNOSIS — E11621 Type 2 diabetes mellitus with foot ulcer: Secondary | ICD-10-CM | POA: Diagnosis not present

## 2023-06-18 DIAGNOSIS — L97522 Non-pressure chronic ulcer of other part of left foot with fat layer exposed: Secondary | ICD-10-CM | POA: Diagnosis not present

## 2023-06-18 LAB — CBC
HCT: 35.2 % — ABNORMAL LOW (ref 39.0–52.0)
Hemoglobin: 10.4 g/dL — ABNORMAL LOW (ref 13.0–17.0)
MCH: 29.8 pg (ref 26.0–34.0)
MCHC: 29.5 g/dL — ABNORMAL LOW (ref 30.0–36.0)
MCV: 100.9 fL — ABNORMAL HIGH (ref 80.0–100.0)
Platelets: 141 10*3/uL — ABNORMAL LOW (ref 150–400)
RBC: 3.49 MIL/uL — ABNORMAL LOW (ref 4.22–5.81)
RDW: 15.5 % (ref 11.5–15.5)
WBC: 9.1 10*3/uL (ref 4.0–10.5)
nRBC: 0 % (ref 0.0–0.2)

## 2023-06-18 LAB — GLUCOSE, CAPILLARY
Glucose-Capillary: 156 mg/dL — ABNORMAL HIGH (ref 70–99)
Glucose-Capillary: 435 mg/dL — ABNORMAL HIGH (ref 70–99)
Glucose-Capillary: 394 mg/dL — ABNORMAL HIGH (ref 70–99)

## 2023-06-18 LAB — BASIC METABOLIC PANEL
Anion gap: 8 (ref 5–15)
BUN: 30 mg/dL — ABNORMAL HIGH (ref 8–23)
CO2: 23 mmol/L (ref 22–32)
Calcium: 8.5 mg/dL — ABNORMAL LOW (ref 8.9–10.3)
Chloride: 110 mmol/L (ref 98–111)
Creatinine, Ser: 1.71 mg/dL — ABNORMAL HIGH (ref 0.61–1.24)
GFR, Estimated: 45 mL/min — ABNORMAL LOW (ref >60)
Glucose, Bld: 198 mg/dL — ABNORMAL HIGH (ref 70–99)
Potassium: 4.6 mmol/L (ref 3.5–5.1)
Sodium: 141 mmol/L (ref 135–145)

## 2023-06-18 LAB — PHOSPHORUS: Phosphorus: 3.5 mg/dL (ref 2.5–4.6)

## 2023-06-18 LAB — MAGNESIUM: Magnesium: 2.1 mg/dL (ref 1.7–2.4)

## 2023-06-18 MED ORDER — LACTATED RINGERS IV BOLUS
1000.0000 mL | Freq: Once | INTRAVENOUS | Status: AC
  Administered 2023-06-18: 1000 mL via INTRAVENOUS

## 2023-06-18 MED ORDER — CEPHALEXIN 500 MG PO CAPS
500.0000 mg | ORAL_CAPSULE | Freq: Three times a day (TID) | ORAL | Status: DC
  Administered 2023-06-18: 500 mg via ORAL
  Filled 2023-06-18: qty 1

## 2023-06-18 MED ORDER — INSULIN GLARGINE 100 UNIT/ML ~~LOC~~ SOLN: 15.0000 [IU] | Freq: Once | SUBCUTANEOUS | Status: DC

## 2023-06-18 MED ORDER — PREDNISONE 20 MG PO TABS: 50.0000 mg | ORAL_TABLET | Freq: Every day | ORAL | Status: DC

## 2023-06-18 MED ORDER — INSULIN PEN NEEDLE 31G X 5 MM MISC: 0 refills | Status: AC

## 2023-06-18 MED ORDER — DOXYCYCLINE HYCLATE 100 MG PO TABS: 100.0000 mg | ORAL_TABLET | Freq: Two times a day (BID) | ORAL | 0 refills | Status: DC

## 2023-06-18 MED ORDER — OSELTAMIVIR PHOSPHATE 30 MG PO CAPS: 30.0000 mg | ORAL_CAPSULE | Freq: Two times a day (BID) | ORAL | Status: DC

## 2023-06-18 MED ORDER — INSULIN GLARGINE-YFGN 100 UNIT/ML ~~LOC~~ SOLN
15.0000 [IU] | Freq: Once | SUBCUTANEOUS | Status: AC
  Administered 2023-06-18: 15 [IU] via SUBCUTANEOUS
  Filled 2023-06-18: qty 0.15

## 2023-06-18 MED ORDER — OSELTAMIVIR PHOSPHATE 30 MG PO CAPS: 30.0000 mg | ORAL_CAPSULE | Freq: Two times a day (BID) | ORAL | 0 refills | Status: DC

## 2023-06-18 MED ORDER — OSELTAMIVIR PHOSPHATE 75 MG PO CAPS
75.0000 mg | ORAL_CAPSULE | Freq: Once | ORAL | Status: AC
  Administered 2023-06-18: 75 mg via ORAL
  Filled 2023-06-18: qty 1

## 2023-06-18 MED ORDER — INSULIN ASPART 100 UNIT/ML IJ SOLN
20.0000 [IU] | Freq: Once | INTRAMUSCULAR | Status: AC
  Administered 2023-06-18: 20 [IU] via SUBCUTANEOUS

## 2023-06-18 MED ORDER — PREDNISONE 50 MG PO TABS: 50.0000 mg | ORAL_TABLET | Freq: Every day | ORAL | 0 refills | Status: DC

## 2023-06-18 MED ORDER — NOVOLOG FLEXPEN 100 UNIT/ML ~~LOC~~ SOPN: PEN_INJECTOR | SUBCUTANEOUS | 2 refills | Status: DC

## 2023-06-18 MED ORDER — CEPHALEXIN 500 MG PO CAPS: 500.0000 mg | ORAL_CAPSULE | Freq: Three times a day (TID) | ORAL | 0 refills | Status: DC

## 2023-06-18 NOTE — Telephone Encounter (Signed)
 Scott [name] called from [facility] and said the hospital has changed his sliding scale. Do you want her to follow this or what do you want her to do?

## 2023-06-18 NOTE — Telephone Encounter (Signed)
 He can continue with the hospitals recommendations for now.  Have them reach out if glucose is above 300 for 3 tests in a row, or if he has hypoglycemia 3 times in a week.

## 2023-06-18 NOTE — Care Management Important Message (Signed)
 Important Message  Patient Details  Name: Scott [name] MRN: [id number] Date of Birth: 01-15-1961   Important Message Given:  N/A - LOS <3 / Initial given by admissions     Corey Harold 06/18/2023, 12:04 PM

## 2023-06-18 NOTE — Progress Notes (Signed)
 Nsg Discharge Note  Admit Date:  06/15/2023 Discharge date: 06/18/2023   [facility]well Caul Zuch to be D/C'd Nursing Home per MD order.  AVS completed.  Copy for chart, and copy for patient signed, and dated. Patient/caregiver able to verbalize understanding.  Discharge Medication: Allergies as of 06/18/2023   No Known Allergies      Medication List     TAKE these medications    acetaminophen 325 MG tablet Commonly known as: TYLENOL Take 650 mg by mouth every 6 (six) hours as needed.   albuterol 108 (90 Base) MCG/ACT inhaler Commonly known as: VENTOLIN HFA Inhale 2 puffs into the lungs every 6 (six) hours as needed for wheezing or shortness of breath.   amLODipine 5 MG tablet Commonly known as: NORVASC Take 1 tablet (5 mg total) by mouth daily.   ascorbic acid 500 MG tablet Commonly known as: VITAMIN C Take 500 mg by mouth daily.   atorvastatin 40 MG tablet Commonly known as: LIPITOR Take 40 mg by mouth daily.   benztropine 1 MG tablet Commonly known as: COGENTIN Take 1 mg by mouth 2 (two) times daily.   cephALEXin 500 MG capsule Commonly known as: KEFLEX Take 1 capsule (500 mg total) by mouth every 8 (eight) hours.   cetirizine 10 MG chewable tablet Commonly known as: ZYRTEC Chew 10 mg by mouth daily.   Daily-Vite Tabs Take 1 tablet by mouth daily.   divalproex 500 MG DR tablet Commonly known as: DEPAKOTE Take 500-1,000 mg by mouth 2 (two) times daily. Take 1 tab am and 2 tabs pm   doxycycline 100 MG tablet Commonly known as: VIBRA-TABS Take 1 tablet (100 mg total) by mouth every 12 (twelve) hours.   DropSafe Safety Pen Needles 31G X 6 MM Misc Generic drug: Insulin Pen Needle USE FOR ONCE DAILY LANTUS ADMINISTRATION. What changed: Another medication with the same name was added. Make sure you understand how and when to take each.   BD AutoShield Duo 30G X 5 MM Misc Generic drug: Insulin Pen Needle USE FOR ONCE DAILY LANTUS ADMINISTRATION. What changed:  Another medication with the same name was added. Make sure you understand how and when to take each.   Insulin Pen Needle 31G X 5 MM Misc Use with insulin pen to dispense insulin as directed What changed: You were already taking a medication with the same name, and this prescription was added. Make sure you understand how and when to take each.   EasyMax Test test strip Generic drug: glucose blood USE AS DIRECTED TO CHECK BLOOD SUGAR TWICE DAILY.   furosemide 20 MG tablet Commonly known as: LASIX Take 1 tablet (20 mg total) by mouth daily.   gabapentin 100 MG capsule Commonly known as: NEURONTIN Take 100 mg by mouth at bedtime.   ipratropium-albuterol 0.5-2.5 (3) MG/3ML Soln Commonly known as: DUONEB Take 3 mLs by nebulization every 4 (four) hours as needed (wheezing, shortness of breath).   Lantus SoloStar 100 UNIT/ML Solostar Pen Generic drug: insulin glargine INJECT 60 UNITS SUBCUTANEOUSLY AT BEDTIME. (HOLD IF BS BELOW 60:CALL MD IF BS ABOVE 400)   lithium carbonate 300 MG capsule Take 300 mg by mouth in the morning and at bedtime.   loratadine 10 MG tablet Commonly known as: CLARITIN Take 10 mg by mouth daily.   Melatonin 3 MG Subl Place 3 mg under the tongue at bedtime.   metoprolol tartrate 25 MG tablet Commonly known as: LOPRESSOR Take 0.5 tablets (12.5 mg total) by mouth 2 (  two) times daily. What changed: how much to take   mupirocin ointment 2 % Commonly known as: BACTROBAN Apply 1 Application topically 2 (two) times daily as needed.   nitroGLYCERIN 0.2 mg/hr patch Commonly known as: NITRODUR - Dosed in mg/24 hr Place 0.2 mg onto the skin daily. Apply to dorsal left foot   NovoLOG FlexPen 100 UNIT/ML FlexPen Generic drug: insulin aspart TID with meals Sugar 121-150--2 units; 151-200--3 units; 201-250--5 units; 251-300--8 units; 301-350--11 units; 351-400--15 units   nutrition supplement (JUVEN) Pack Take 1 packet by mouth 2 (two) times daily between  meals.   nystatin powder Apply 1 Application topically 3 (three) times daily. Apply to groin area three times daily   oseltamivir 30 MG capsule Commonly known as: TAMIFLU Take 1 capsule (30 mg total) by mouth 2 (two) times daily.   predniSONE 50 MG tablet Commonly known as: DELTASONE Take 1 tablet (50 mg total) by mouth daily with breakfast. Start taking on: June 19, 2023   risperiDONE 2 MG tablet Commonly known as: RISPERDAL Take 1 tablet (2 mg total) by mouth daily.   silver sulfADIAZINE 1 % cream Commonly known as: SILVADENE Apply 1 Application topically daily.   Spiriva Respimat 2.5 MCG/ACT Aers Generic drug: Tiotropium Bromide Monohydrate Inhale 2 puffs into the lungs daily.   tamsulosin 0.4 MG Caps capsule Commonly known as: FLOMAX TAKE (1) CAPSULE BY MOUTH TWICE DAILY. What changed: See the new instructions.   traZODone 50 MG tablet Commonly known as: DESYREL Take 50 mg by mouth at bedtime.   Zinc Sulfate 220 (50 Zn) MG Tabs Take 1 tablet by mouth daily.               Durable Medical Equipment  (From admission, onward)           Start     Ordered   06/18/23 1135  For home use only DME oxygen  Once       Comments: POC for COPD  Question Answer Comment  Length of Need 6 Months   Mode or (Route) Nasal cannula   Liters per Minute 3   Frequency Continuous (stationary and portable oxygen unit needed)   Oxygen conserving device Yes   Oxygen delivery system Gas      06/18/23 1134            Discharge Assessment: Vitals:   06/18/23 0846 06/18/23 1402  BP:  131/80  Pulse:  75  Resp:  20  Temp:  98.4 F (36.9 C)  SpO2: 96% 93%   Skin clean, dry and intact without evidence of skin break down, no evidence of skin tears noted. IV catheter discontinued intact. Site without signs and symptoms of complications - no redness or edema noted at insertion site, patient denies c/o pain - only slight tenderness at site.  Dressing with slight pressure  applied.  D/c Instructions-Education: Discharge instructions given to patient/family with verbalized understanding. D/c education completed with patient/family including follow up instructions, medication list, d/c activities limitations if indicated, with other d/c instructions as indicated by MD - patient able to verbalize understanding, all questions fully answered. Patient instructed to return to ED, call 911, or call MD for any changes in condition.  Patient escorted via WC, and D/C home via private auto.  Demetrio Lapping, LPN 1/61/0960 4:54 PM

## 2023-06-18 NOTE — TOC Transition Note (Signed)
 Transition of Care [facility]) - Discharge Note   Patient Details  Name: Scott [name] MRN: [id number] Date of Birth: [birth year]  Transition of Care [facility] CM/SW Contact:  Villa Herb, LCSWA Phone Number: 06/18/2023, 2:16 PM  Clinical Narrative:    CSW updated pt is medically stable for D/C. CSW spoke to [place] who states that they are already active with pt for home O2 services. CSW spoke to [place] with [employer] who states O2 is in pts room at facility. CSW completed Fl2. D/C summary and Fl2 sent to facility for review. HH set up with Maralyn Sago at [facility], they will follow for [facility] PT/RN, orders placed by MD. CSW spoke to [place] who states they have reviewed D/C clinicals and everything looks good. Nettie Elm states they will provide transportation for pt. RN updated on plan for D/C. TOC signing off.   Final next level of care: Assisted Living Barriers to Discharge: Barriers Resolved   Patient Goals and CMS Choice Patient states their goals for this hospitalization and ongoing recovery are:: return to ALF CMS Medicare.gov Compare Post Acute Care list provided to:: Patient Choice offered to / list presented to : Patient      Discharge Placement                       Discharge Plan and Services Additional resources added to the After Visit Summary for                            HH Arranged: RN, PT [facility] Agency: [facility] Date [facility] Agency Contacted: 06/18/23   Representative spoke with at [facility] Agency: Maralyn Sago  Social Drivers of Health (SDOH) Interventions SDOH Screenings   Food Insecurity: No Food Insecurity (06/16/2023)  Housing: Low Risk  (06/16/2023)  Transportation Needs: No Transportation Needs (06/16/2023)  Utilities: Not At Risk (06/16/2023)  Tobacco Use: Low Risk  (06/15/2023)     Readmission Risk Interventions    06/18/2023    2:13 PM 03/26/2023   12:38 PM 02/12/2023   10:56 AM  Readmission Risk Prevention Plan  Transportation Screening Complete  Complete Complete  PCP or Specialist Appt within 3-5 Days  Not Complete   HRI or Home Care Consult  Complete Complete  Social Work Consult for Recovery Care Planning/Counseling  Complete Complete  Palliative Care Screening  Not Applicable Not Applicable  Medication Review [employer]) Complete Complete Complete  HRI or Home Care Consult Complete    SW Recovery Care/Counseling Consult Complete    Palliative Care Screening Not Applicable    Skilled Nursing Facility Not Applicable

## 2023-06-18 NOTE — Inpatient Diabetes Management (Signed)
 Inpatient Diabetes Program Recommendations  AACE/ADA: New Consensus Statement on Inpatient Glycemic Control  Target Ranges:  Prepandial:   less than 140 mg/dL      Peak postprandial:   less than 180 mg/dL (1-2 hours)      Critically ill patients:  140 - 180 mg/dL    Latest Reference Range & Units 06/18/23 04:07  Glucose 70 - 99 mg/dL 161 (H)    Latest Reference Range & Units 06/17/23 07:32 06/17/23 11:32 06/17/23 16:06 06/17/23 17:51 06/17/23 20:05  Glucose-Capillary 70 - 99 mg/dL 096 (H) 045 (H) 409 (HH) 554 (HH) 454 (H)   Review of Glycemic Control  Diabetes history: DM2 Outpatient Diabetes medications: Lantus 60 units QHS Current orders for Inpatient glycemic control: Semglee 10 units daily, Novolog 5 units TID with meals, Novolog 0-15 units TID with meals, Novolog 0-5 units at bedtime; Solumedrol 60 mg daily  Inpatient Diabetes Program Recommendations:    Insulin: Please consider increasing Semglee to 25 units daily.  Thanks, Orlando Penner, RN, MSN, CDCES Diabetes Coordinator Inpatient Diabetes Program [phone] (Team Pager from 8am to 5pm)

## 2023-06-18 NOTE — Telephone Encounter (Signed)
 Called [facility] and made aware

## 2023-06-18 NOTE — Discharge Summary (Signed)
 Physician Discharge Summary   Patient: Scott [name] MRN: [id number] DOB: 1960/09/30  Admit date:     06/15/2023  Discharge date: 06/18/23  Discharge Physician: Onalee Hua Ramona Ruark   PCP: Benetta Spar, MD   Recommendations at discharge:   Please follow up with primary care provider within 1-2 weeks  Please repeat BMP and CBC in one week     Hospital Course: 63 year old male with a history of hypertension, diabetes mellitus type 2, hyperlipidemia, bipolar disorder, tobacco abuse (quit 1 yr ago), COPD, and TBI presenting from [facility] with fever, a fall and generalized weakness.  The patient himself cannot tell me exactly how long he has had fevers.  His history can be a little bit contradictory.  He relates a mechanical fall earlier in the week.  He denied any loss of consciousness or hitting his head.  He related 1 week history of pain in his left leg with some redness.  He has not noted any drainage.  He usually ambulates with a cane.  Apparently, he has had been having difficulty getting up because of worsening generalized weakness.  He complains of a cough but denies any shortness of breath, chest pain, headache, neck pain.  He denies any mopped assist.  He denies any nausea, vomiting, diarrhea, abdominal pain, hematochezia, melena.  There is no dysuria or hematuria.  He states that he has been wearing his support hose on his legs. The patient states that he quit smoking over a year ago.  He has a nearly 40-pack-year history. In the ED, the patient was febrile to 103.2 F with tachycardia.  He was hemodynamically stable.  Oxygen saturation was 86% room air.  He was placed on 4 L with saturation up to 93-94%.  Chest x-ray was negative for infiltrates.  X-ray of the left foot and left ankle were negative for fractures or dislocation. WBC 8.1, hemoglobin 10.8, platelets 181.  Sodium 135, potassium 3.8, bicarbonate 23, serum creatinine 1.97.  Lactic acid 2.5>> 1.8.  Lithium level 0.70.  EKG  showed artifact with sinus rhythm.  COVID-19 PCR is negative.  UA neg for pyuria.  Patient was started on vancomycin and cefepime.  Notably, the patient had a recent hospital admission from 03/25/2023 to 04/02/2023 secondary to osteomyelitis of his left foot.  He underwent amputation of his left first ray by Dr. Lajoyce Corners.  Assessment and Plan:  Severe sepsis -Present on admission -Secondary to pneumonia and possible cellulitis -Lactic acid peaked 2.5>> 1.6 -Check PCT 0.41 -Continue IV fluids -Continue vancomycin and cefepime pending culture data -UA--no pyuria -blood cultures remain neg -d/c to ALF with cephalexin and doxy x 4 more days   Acute respiratory failure with hypoxia -Personally reviewed chest x-ray--increased interstitial markings without frank consolidation -3/9 CT chest--Patchy irregular bandlike consolidation with some air Bronchograms LLL and RUL/RLL -continue doxy -Stable on 4 L initially -Wean oxygen for saturation greater 92% -3/9 VBG 7.37/55/<31/31 -influenza +>>start oseltamivir x 5 days -d/c back to ALF with 3L [place]   Lobar pneumonia -CT chest and antibiotics as discussed above   COPD exacerbation -Started Brovana -Started Pulmicort -Started DuoNebs -COVID-19 PCR negative/influenza negative -Check viral respiratory panel -start solumedrol>>d/c to ALF with prednisone x 3 more day   Cellulitis left lower extremity -Good palpable pulses -improving   CKD 3b -Baseline creatinine 1.6-1.8 -Continue IV fluids -Holding furosemide -serum creatinine 1.71 on day of dc   Uncontrolled diabetes mellitus type 2 with hyperglycemia -NovoLog sliding scale -05/20/2023 hemoglobin A1c 8.4 -Holding glipizide  and tradjenta -started semglee 25 units daily   Hx of osteomyelitis left hallux -See pictures below -s/p left first ray amputation 03/29/23--Dr. Lajoyce Corners   Essential hypertension -Hold amlodipine>>restart -restart metoprolol -monitor clinically   Mixed  hyperlipidemia -Continue statin   Bipolar disorder -Continue valproic acid, lithium, benztropine, respiradone      Consultants: none Procedures performed: none   Disposition: Assisted living Diet recommendation:  Carb modified diet DISCHARGE MEDICATION: Allergies as of 06/18/2023   No Known Allergies      Medication List     TAKE these medications    acetaminophen 325 MG tablet Commonly known as: TYLENOL Take 650 mg by mouth every 6 (six) hours as needed.   albuterol 108 (90 Base) MCG/ACT inhaler Commonly known as: VENTOLIN HFA Inhale 2 puffs into the lungs every 6 (six) hours as needed for wheezing or shortness of breath.   amLODipine 5 MG tablet Commonly known as: NORVASC Take 1 tablet (5 mg total) by mouth daily.   ascorbic acid 500 MG tablet Commonly known as: VITAMIN C Take 500 mg by mouth daily.   atorvastatin 40 MG tablet Commonly known as: LIPITOR Take 40 mg by mouth daily.   benztropine 1 MG tablet Commonly known as: COGENTIN Take 1 mg by mouth 2 (two) times daily.   cephALEXin 500 MG capsule Commonly known as: KEFLEX Take 1 capsule (500 mg total) by mouth every 8 (eight) hours.   cetirizine 10 MG chewable tablet Commonly known as: ZYRTEC Chew 10 mg by mouth daily.   Daily-Vite Tabs Take 1 tablet by mouth daily.   divalproex 500 MG DR tablet Commonly known as: DEPAKOTE Take 500-1,000 mg by mouth 2 (two) times daily. Take 1 tab am and 2 tabs pm   doxycycline 100 MG tablet Commonly known as: VIBRA-TABS Take 1 tablet (100 mg total) by mouth every 12 (twelve) hours.   DropSafe Safety Pen Needles 31G X 6 MM Misc Generic drug: Insulin Pen Needle USE FOR ONCE DAILY LANTUS ADMINISTRATION. What changed: Another medication with the same name was added. Make sure you understand how and when to take each.   BD AutoShield Duo 30G X 5 MM Misc Generic drug: Insulin Pen Needle USE FOR ONCE DAILY LANTUS ADMINISTRATION. What changed: Another  medication with the same name was added. Make sure you understand how and when to take each.   Insulin Pen Needle 31G X 5 MM Misc Use with insulin pen to dispense insulin as directed What changed: You were already taking a medication with the same name, and this prescription was added. Make sure you understand how and when to take each.   EasyMax Test test strip Generic drug: glucose blood USE AS DIRECTED TO CHECK BLOOD SUGAR TWICE DAILY.   furosemide 20 MG tablet Commonly known as: LASIX Take 1 tablet (20 mg total) by mouth daily.   gabapentin 100 MG capsule Commonly known as: NEURONTIN Take 100 mg by mouth at bedtime.   ipratropium-albuterol 0.5-2.5 (3) MG/3ML Soln Commonly known as: DUONEB Take 3 mLs by nebulization every 4 (four) hours as needed (wheezing, shortness of breath).   Lantus SoloStar 100 UNIT/ML Solostar Pen Generic drug: insulin glargine INJECT 60 UNITS SUBCUTANEOUSLY AT BEDTIME. (HOLD IF BS BELOW 60:CALL MD IF BS ABOVE 400)   lithium carbonate 300 MG capsule Take 300 mg by mouth in the morning and at bedtime.   loratadine 10 MG tablet Commonly known as: CLARITIN Take 10 mg by mouth daily.   Melatonin 3 MG Subl  Place 3 mg under the tongue at bedtime.   metoprolol tartrate 25 MG tablet Commonly known as: LOPRESSOR Take 0.5 tablets (12.5 mg total) by mouth 2 (two) times daily. What changed: how much to take   mupirocin ointment 2 % Commonly known as: BACTROBAN Apply 1 Application topically 2 (two) times daily as needed.   nitroGLYCERIN 0.2 mg/hr patch Commonly known as: NITRODUR - Dosed in mg/24 hr Place 0.2 mg onto the skin daily. Apply to dorsal left foot   NovoLOG FlexPen 100 UNIT/ML FlexPen Generic drug: insulin aspart TID with meals Sugar 121-150--2 units; 151-200--3 units; 201-250--5 units; 251-300--8 units; 301-350--11 units; 351-400--15 units   nutrition supplement (JUVEN) Pack Take 1 packet by mouth 2 (two) times daily between meals.    nystatin powder Apply 1 Application topically 3 (three) times daily. Apply to groin area three times daily   oseltamivir 30 MG capsule Commonly known as: TAMIFLU Take 1 capsule (30 mg total) by mouth 2 (two) times daily.   predniSONE 50 MG tablet Commonly known as: DELTASONE Take 1 tablet (50 mg total) by mouth daily with breakfast. Start taking on: June 19, 2023   risperiDONE 2 MG tablet Commonly known as: RISPERDAL Take 1 tablet (2 mg total) by mouth daily.   silver sulfADIAZINE 1 % cream Commonly known as: SILVADENE Apply 1 Application topically daily.   Spiriva Respimat 2.5 MCG/ACT Aers Generic drug: Tiotropium Bromide Monohydrate Inhale 2 puffs into the lungs daily.   tamsulosin 0.4 MG Caps capsule Commonly known as: FLOMAX TAKE (1) CAPSULE BY MOUTH TWICE DAILY. What changed: See the new instructions.   traZODone 50 MG tablet Commonly known as: DESYREL Take 50 mg by mouth at bedtime.   Zinc Sulfate 220 (50 Zn) MG Tabs Take 1 tablet by mouth daily.               Durable Medical Equipment  (From admission, onward)           Start     Ordered   06/17/23 1647  For home use only DME oxygen  Once       Comments: POC for COPD  Question Answer Comment  Length of Need 6 Months   Mode or (Route) Nasal cannula   Liters per Minute 2   Frequency Continuous (stationary and portable oxygen unit needed)   Oxygen conserving device Yes   Oxygen delivery system Gas      06/17/23 1646            Discharge Exam: Filed Weights   06/15/23 2247  Weight: 99.8 kg  HEENT:  [place]/AT, No thrush, no icterus CV:  RRR, no rub, no S3, no S4 Lung:  bibasilar rales.  No wheeze Abd:  soft/+BS, NT Ext:  1 +LE  edema, no lymphangitis, no synovitis, no rash -LLE without drainage or necrosis  Condition at discharge: stable  The results of significant diagnostics from this hospitalization (including imaging, microbiology, ancillary and laboratory) are listed below for  reference.   Imaging Studies: CT CHEST WO CONTRAST Result Date: 06/16/2023 CLINICAL DATA:  Respiratory illness, nondiagnostic xray EXAM: CT CHEST WITHOUT CONTRAST TECHNIQUE: Multidetector CT imaging of the chest was performed following the standard protocol without IV contrast. RADIATION DOSE REDUCTION: This exam was performed according to the departmental dose-optimization program which includes automated exposure control, adjustment of the mA and/or kV according to patient size and/or use of iterative reconstruction technique. COMPARISON:  Chest radiograph from one day prior. 03/08/2023 chest CT angiogram. FINDINGS: Cardiovascular:  Normal heart size. No significant pericardial effusion/thickening. Left anterior descending coronary atherosclerosis. Mildly atherosclerotic nonaneurysmal thoracic aorta. Normal caliber pulmonary arteries. Mediastinum/Nodes: No significant thyroid nodules. Unremarkable esophagus. No axillary adenopathy. Mildly enlarged 1.0 cm right paratracheal node on series 2/image 67 and mildly enlarged 1.2 cm right hilar node on series 2/image 76, new. No discrete left hilar adenopathy on these noncontrast images. Lungs/Pleura: No pneumothorax. No pleural effusion. Mild centrilobular and paraseptal emphysema with diffuse bronchial wall thickening. Patchy irregular bandlike consolidation with some air bronchograms and mild volume loss in the left lower lobe and less prominently in the posterior right upper lobe and peripheral right lower lobe. No lung masses or significant pulmonary nodules in the aerated portions of the lungs. Upper abdomen: Cholelithiasis. Musculoskeletal: No aggressive appearing focal osseous lesions. Mild thoracic spondylosis. IMPRESSION: 1. Patchy irregular bandlike consolidation with some air bronchograms and mild volume loss in the left lower lobe and less prominently in the posterior right upper lobe and peripheral right lower lobe, favoring multilobar pneumonia with some  atelectasis. 2. Mild mediastinal and right hilar lymphadenopathy, new, probably reactive. 3. Suggest follow-up chest CT with IV contrast in 3 months to document resolution given patient risk factors. 4. One vessel coronary atherosclerosis. 5. Cholelithiasis. 6. Aortic Atherosclerosis (ICD10-I70.0) and Emphysema (ICD10-J43.9). Electronically Signed   By: Delbert Phenix M.D.   On: 06/16/2023 11:35   DG Chest Portable 1 View Result Date: 06/16/2023 CLINICAL DATA:  Sepsis protocol, increased weakness and fever, incidental recent fall. Left foot and left foreleg injury. Left foot first ray amputation on 04/06/2023. EXAM: PORTABLE CHEST 1 VIEW LEFT TIBIA FIBULA 2 VIEWS LEFT FOOT 3 VIEWS COMPARISON:  CTA chest 03/08/2023, preoperative left foot AP and lateral 03/25/2023, left tibia fibula 03/07/2023. FINDINGS: Chest AP portable 11:39 p.m.: The heart size and mediastinal contours are within normal limits. There is aortic atherosclerosis. Multiple overlying monitor wires. Both lungs are clear. The visualized skeletal structures are unremarkable. AP and lateral left tibia fibula: There is mild osteopenia without evidence of fractures. Lateral sideplate ORIF fixation hardware in proximal tibia and securing screws are unremarkable. No interval change in appearance. No primary pathologic bone lesion is seen. There is mild subcutaneous stranding edema in the foreleg and ankle. There is moderate tricompartmental arthrosis of the knee and mild tibiotalar arthrosis. No soft tissue gas or foreign bodies are seen. The soft tissue edema was greater previously but in all other respects there are no further changes. Left foot three views: Since 03/25/2023, interval first ray amputation. There is mild-to-moderate edema in the foot. No soft tissue gas is seen. There is osteopenia without evidence of fractures. Hyperextension at the MTP joints. There are mild features of midfoot arthrosis. Small plantar and dorsal calcaneal spurring. There  is no aggressive osseous lesion. Osteophytes superior talonavicular joint. IMPRESSION: 1. No evidence of acute chest disease. 2. Osteopenia and degenerative change without evidence of left tibiofibular or left foot fractures. 3. Mild subcutaneous stranding edema in the foreleg and ankle. No soft tissue gas or foreign bodies are seen. Edema throughout the foot. 4. Interval left foot first ray amputation since 03/25/2023. 5. Aortic atherosclerosis. Electronically Signed   By: Almira Bar M.D.   On: 06/16/2023 00:23   DG Tibia/Fibula Left Result Date: 06/16/2023 CLINICAL DATA:  Sepsis protocol, increased weakness and fever, incidental recent fall. Left foot and left foreleg injury. Left foot first ray amputation on 04/06/2023. EXAM: PORTABLE CHEST 1 VIEW LEFT TIBIA FIBULA 2 VIEWS LEFT FOOT 3 VIEWS COMPARISON:  CTA chest 03/08/2023, preoperative left foot AP and lateral 03/25/2023, left tibia fibula 03/07/2023. FINDINGS: Chest AP portable 11:39 p.m.: The heart size and mediastinal contours are within normal limits. There is aortic atherosclerosis. Multiple overlying monitor wires. Both lungs are clear. The visualized skeletal structures are unremarkable. AP and lateral left tibia fibula: There is mild osteopenia without evidence of fractures. Lateral sideplate ORIF fixation hardware in proximal tibia and securing screws are unremarkable. No interval change in appearance. No primary pathologic bone lesion is seen. There is mild subcutaneous stranding edema in the foreleg and ankle. There is moderate tricompartmental arthrosis of the knee and mild tibiotalar arthrosis. No soft tissue gas or foreign bodies are seen. The soft tissue edema was greater previously but in all other respects there are no further changes. Left foot three views: Since 03/25/2023, interval first ray amputation. There is mild-to-moderate edema in the foot. No soft tissue gas is seen. There is osteopenia without evidence of fractures.  Hyperextension at the MTP joints. There are mild features of midfoot arthrosis. Small plantar and dorsal calcaneal spurring. There is no aggressive osseous lesion. Osteophytes superior talonavicular joint. IMPRESSION: 1. No evidence of acute chest disease. 2. Osteopenia and degenerative change without evidence of left tibiofibular or left foot fractures. 3. Mild subcutaneous stranding edema in the foreleg and ankle. No soft tissue gas or foreign bodies are seen. Edema throughout the foot. 4. Interval left foot first ray amputation since 03/25/2023. 5. Aortic atherosclerosis. Electronically Signed   By: Almira Bar M.D.   On: 06/16/2023 00:23   DG Foot Complete Left Result Date: 06/16/2023 CLINICAL DATA:  Sepsis protocol, increased weakness and fever, incidental recent fall. Left foot and left foreleg injury. Left foot first ray amputation on 04/06/2023. EXAM: PORTABLE CHEST 1 VIEW LEFT TIBIA FIBULA 2 VIEWS LEFT FOOT 3 VIEWS COMPARISON:  CTA chest 03/08/2023, preoperative left foot AP and lateral 03/25/2023, left tibia fibula 03/07/2023. FINDINGS: Chest AP portable 11:39 p.m.: The heart size and mediastinal contours are within normal limits. There is aortic atherosclerosis. Multiple overlying monitor wires. Both lungs are clear. The visualized skeletal structures are unremarkable. AP and lateral left tibia fibula: There is mild osteopenia without evidence of fractures. Lateral sideplate ORIF fixation hardware in proximal tibia and securing screws are unremarkable. No interval change in appearance. No primary pathologic bone lesion is seen. There is mild subcutaneous stranding edema in the foreleg and ankle. There is moderate tricompartmental arthrosis of the knee and mild tibiotalar arthrosis. No soft tissue gas or foreign bodies are seen. The soft tissue edema was greater previously but in all other respects there are no further changes. Left foot three views: Since 03/25/2023, interval first ray amputation.  There is mild-to-moderate edema in the foot. No soft tissue gas is seen. There is osteopenia without evidence of fractures. Hyperextension at the MTP joints. There are mild features of midfoot arthrosis. Small plantar and dorsal calcaneal spurring. There is no aggressive osseous lesion. Osteophytes superior talonavicular joint. IMPRESSION: 1. No evidence of acute chest disease. 2. Osteopenia and degenerative change without evidence of left tibiofibular or left foot fractures. 3. Mild subcutaneous stranding edema in the foreleg and ankle. No soft tissue gas or foreign bodies are seen. Edema throughout the foot. 4. Interval left foot first ray amputation since 03/25/2023. 5. Aortic atherosclerosis. Electronically Signed   By: Almira Bar M.D.   On: 06/16/2023 00:23    Microbiology: Results for orders placed or performed during the hospital encounter of 06/15/23  Culture,  blood (Routine x 2)     Status: None (Preliminary result)   Collection Time: 06/15/23 11:08 PM   Specimen: BLOOD  Result Value Ref Range Status   Specimen Description BLOOD BLOOD LEFT ARM  Final   Special Requests NONE  Final   Culture   Final    NO GROWTH 3 DAYS Performed at [facility], [address] [place], [place] [address]    Report Status PENDING  Incomplete  Resp panel by RT-PCR (RSV, Flu A&B, Covid) Anterior Nasal Swab     Status: None   Collection Time: 06/15/23 11:08 PM   Specimen: Anterior Nasal Swab  Result Value Ref Range Status   SARS Coronavirus 2 by RT PCR NEGATIVE NEGATIVE Final    Comment: (NOTE) SARS-CoV-2 target nucleic acids are NOT DETECTED.  The SARS-CoV-2 RNA is generally detectable in upper respiratory specimens during the acute phase of infection. The lowest concentration of SARS-CoV-2 viral copies this assay can detect is 138 copies/mL. A negative result does not preclude SARS-Cov-2 infection and should not be used as the sole basis for treatment or other patient management decisions. A  negative result may occur with  improper specimen collection/handling, submission of specimen other than nasopharyngeal swab, presence of viral mutation(s) within the areas targeted by this assay, and inadequate number of viral copies(<138 copies/mL). A negative result must be combined with clinical observations, patient history, and epidemiological information. The expected result is Negative.  Fact Sheet for Patients:  BloggerCourse.com  Fact Sheet for Healthcare Providers:  SeriousBroker.it  This test is no t yet approved or cleared by the [place] FDA and  has been authorized for detection and/or diagnosis of SARS-CoV-2 by FDA under an Emergency Use Authorization (EUA). This EUA will remain  in effect (meaning this test can be used) for the duration of the COVID-19 declaration under Section 564(b)(1) of the Act, 21 U.S.C.section 360bbb-3(b)(1), unless the authorization is terminated  or revoked sooner.       Influenza A by PCR NEGATIVE NEGATIVE Final   Influenza B by PCR NEGATIVE NEGATIVE Final    Comment: (NOTE) The Xpert Xpress SARS-CoV-2/FLU/RSV plus assay is intended as an aid in the diagnosis of influenza from Nasopharyngeal swab specimens and should not be used as a sole basis for treatment. Nasal washings and aspirates are unacceptable for Xpert Xpress SARS-CoV-2/FLU/RSV testing.  Fact Sheet for Patients: BloggerCourse.com  Fact Sheet for Healthcare Providers: SeriousBroker.it  This test is not yet approved or cleared by the [place] FDA and has been authorized for detection and/or diagnosis of SARS-CoV-2 by FDA under an Emergency Use Authorization (EUA). This EUA will remain in effect (meaning this test can be used) for the duration of the COVID-19 declaration under Section 564(b)(1) of the Act, 21 U.S.C. section 360bbb-3(b)(1), unless the authorization  is terminated or revoked.     Resp Syncytial Virus by PCR NEGATIVE NEGATIVE Final    Comment: (NOTE) Fact Sheet for Patients: BloggerCourse.com  Fact Sheet for Healthcare Providers: SeriousBroker.it  This test is not yet approved or cleared by the [place] FDA and has been authorized for detection and/or diagnosis of SARS-CoV-2 by FDA under an Emergency Use Authorization (EUA). This EUA will remain in effect (meaning this test can be used) for the duration of the COVID-19 declaration under Section 564(b)(1) of the Act, 21 U.S.C. section 360bbb-3(b)(1), unless the authorization is terminated or revoked.  Performed at [facility], [address], [place], [place] [address]   Culture, blood (Routine  x 2)     Status: None (Preliminary result)   Collection Time: 06/15/23 11:28 PM   Specimen: BLOOD LEFT HAND  Result Value Ref Range Status   Specimen Description BLOOD LEFT HAND  Final   Special Requests NONE  Final   Culture   Final    NO GROWTH 3 DAYS Performed at [facility], [address] [place], [place] [address]    Report Status PENDING  Incomplete  MRSA Next Gen by PCR, Nasal     Status: None   Collection Time: 06/17/23  8:00 AM   Specimen: Nasal Mucosa; Nasal Swab  Result Value Ref Range Status   MRSA by PCR Next Gen NOT DETECTED NOT DETECTED Final    Comment: (NOTE) The GeneXpert MRSA Assay (FDA approved for NASAL specimens only), is one component of a comprehensive MRSA colonization surveillance program. It is not intended to diagnose MRSA infection nor to guide or monitor treatment for MRSA infections. Test performance is not FDA approved in patients less than 35 years old. Performed at [facility], [address], [place], [place] [address]   Respiratory (~20 pathogens) panel by PCR     Status: Abnormal   Collection Time: 06/17/23  8:04 AM   Specimen: Nasopharyngeal Swab; Respiratory  Result Value Ref Range  Status   Adenovirus NOT DETECTED NOT DETECTED Final   Coronavirus 229E NOT DETECTED NOT DETECTED Final    Comment: (NOTE) The Coronavirus on the Respiratory Panel, DOES NOT test for the novel  Coronavirus (2019 nCoV)    Coronavirus HKU1 NOT DETECTED NOT DETECTED Final   Coronavirus NL63 NOT DETECTED NOT DETECTED Final   Coronavirus OC43 NOT DETECTED NOT DETECTED Final   Metapneumovirus NOT DETECTED NOT DETECTED Final   Rhinovirus / Enterovirus NOT DETECTED NOT DETECTED Final   Influenza A H1 2009 DETECTED (A) NOT DETECTED Final   Influenza B NOT DETECTED NOT DETECTED Final   Parainfluenza Virus 1 NOT DETECTED NOT DETECTED Final   Parainfluenza Virus 2 NOT DETECTED NOT DETECTED Final   Parainfluenza Virus 3 NOT DETECTED NOT DETECTED Final   Parainfluenza Virus 4 NOT DETECTED NOT DETECTED Final   Respiratory Syncytial Virus NOT DETECTED NOT DETECTED Final   Bordetella pertussis NOT DETECTED NOT DETECTED Final   Bordetella Parapertussis NOT DETECTED NOT DETECTED Final   Chlamydophila pneumoniae NOT DETECTED NOT DETECTED Final   Mycoplasma pneumoniae NOT DETECTED NOT DETECTED Final    Comment: Performed at [facility], [address], [place], [place] [address]    Labs: CBC: Recent Labs  Lab 06/15/23 2308 06/17/23 0316 06/18/23 0407  WBC 8.1 8.6 9.1  NEUTROABS 5.7  --   --   HGB 10.8* 11.2* 10.4*  HCT 34.1* 36.3* 35.2*  MCV 96.3 99.5 100.9*  PLT 181 144* 141*   Basic Metabolic Panel: Recent Labs  Lab 06/15/23 2308 06/16/23 0628 06/17/23 0316 06/17/23 1635 06/18/23 0407  NA 135  --  140 133* 141  K 3.8  --  3.6 5.2* 4.6  CL 102  --  107 103 110  CO2 23  --  24 19* 23  GLUCOSE 218*  --  178* 562* 198*  BUN 32*  --  22 32* 30*  CREATININE 1.97*  --  1.71* 2.05* 1.71*  CALCIUM 8.8*  --  8.3* 8.3* 8.5*  MG  --  1.9 1.8  --  2.1  PHOS  --  2.4*  --   --  3.5   Liver Function Tests:  Recent Labs  Lab 06/15/23 2308  AST 22  ALT 20  ALKPHOS 73  BILITOT 0.4   PROT 7.0  ALBUMIN 2.9*   CBG: Recent Labs  Lab 06/17/23 1132 06/17/23 1606 06/17/23 1751 06/17/23 2005 06/18/23 0740  GLUCAP 318* 552* 554* 454* 156*    Discharge time spent: greater than 30 minutes.  Signed: Catarina Hartshorn, MD [facility] 06/18/2023
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
## 2023-06-18 NOTE — Progress Notes (Signed)
 Mobility Specialist Progress Note:    06/18/23 1314  Mobility  Activity Ambulated with assistance in hallway  Level of Assistance Minimal assist, patient does 75% or more  Assistive Device Front wheel walker  Distance Ambulated (ft) 40 ft  Range of Motion/Exercises Active;All extremities  Activity Response Tolerated well  Mobility Referral Yes  Mobility visit 1 Mobility  Mobility Specialist Start Time (ACUTE ONLY) 1150  Mobility Specialist Stop Time (ACUTE ONLY) 1210  Mobility Specialist Time Calculation (min) (ACUTE ONLY) 20 min   Pt received in chair, agreeable to mobility. Required MinA to stand and ambulate with RW. Tolerated well, SpO2 93% on RA throughout. Returned pt to chair, alarm on. Call bell in reach, all needs met.   Scott [name] Mobility Specialist Please contact via Special educational needs teacher or  Rehab office at [phone]

## 2023-06-19 ENCOUNTER — Other Ambulatory Visit: Payer: Self-pay | Admitting: Orthopedic Surgery

## 2023-06-19 NOTE — Consult Note (Signed)
 [facility] Liaison Note  06/19/2023  Scott [name] May 31, 1960 [id number]  Location: RN Hospital Liaison screened the patient remotely at [facility].  Insurance: Medicare/Medicaid   Scott [name] is a 63 y.o. male who is a Primary Care Patient of Scott [name], Scott Salinas, MD Felecia Shelling). The patient was screened for  readmission hospitalization with noted extreme risk score for unplanned readmission risk with 3 IP/3 ED  in 6 months.  The patient was assessed for potential Care Management service needs for post hospital transition for care coordination. Review of patient's electronic medical record reveals patient was admitted for Severe Sepsis.  In further review pt discharged back to [facility] assisted living with [facility] services [facility]). Pt has legal guardian with [facility] DSS. The facility will continue to address pt's ongoing needs.   [facility] Care Management/Population Health does not replace or interfere with any arrangements made by the Inpatient Transition of Care team.   For questions contact:   Elliot Cousin, RN, BSN Hospital Liaison [place]   [facility], Population Health Office Hours MTWF  8:00 am-6:00 pm Direct Dial: [phone] mobile [email or web address]

## 2023-06-20 LAB — CULTURE, BLOOD (ROUTINE X 2)
Culture: NO GROWTH
Culture: NO GROWTH

## 2023-06-21 DIAGNOSIS — I129 Hypertensive chronic kidney disease with stage 1 through stage 4 chronic kidney disease, or unspecified chronic kidney disease: Secondary | ICD-10-CM | POA: Diagnosis not present

## 2023-06-21 DIAGNOSIS — E1165 Type 2 diabetes mellitus with hyperglycemia: Secondary | ICD-10-CM | POA: Diagnosis not present

## 2023-06-21 DIAGNOSIS — E1122 Type 2 diabetes mellitus with diabetic chronic kidney disease: Secondary | ICD-10-CM | POA: Diagnosis not present

## 2023-06-21 DIAGNOSIS — L03116 Cellulitis of left lower limb: Secondary | ICD-10-CM | POA: Diagnosis not present

## 2023-06-21 DIAGNOSIS — E11621 Type 2 diabetes mellitus with foot ulcer: Secondary | ICD-10-CM | POA: Diagnosis not present

## 2023-06-21 DIAGNOSIS — L97522 Non-pressure chronic ulcer of other part of left foot with fat layer exposed: Secondary | ICD-10-CM | POA: Diagnosis not present

## 2023-06-24 ENCOUNTER — Encounter: Payer: Self-pay | Admitting: Orthopedic Surgery

## 2023-06-24 ENCOUNTER — Ambulatory Visit (INDEPENDENT_AMBULATORY_CARE_PROVIDER_SITE_OTHER): Payer: Medicare Other | Admitting: Orthopedic Surgery

## 2023-06-24 DIAGNOSIS — L03116 Cellulitis of left lower limb: Secondary | ICD-10-CM | POA: Diagnosis not present

## 2023-06-24 DIAGNOSIS — Z89412 Acquired absence of left great toe: Secondary | ICD-10-CM

## 2023-06-24 DIAGNOSIS — I872 Venous insufficiency (chronic) (peripheral): Secondary | ICD-10-CM
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
## 2023-06-25 ENCOUNTER — Other Ambulatory Visit (HOSPITAL_COMMUNITY): Payer: Self-pay | Admitting: Gerontology

## 2023-06-25 ENCOUNTER — Encounter: Payer: Self-pay | Admitting: Orthopedic Surgery

## 2023-06-25 DIAGNOSIS — E1165 Type 2 diabetes mellitus with hyperglycemia: Secondary | ICD-10-CM | POA: Diagnosis not present

## 2023-06-25 DIAGNOSIS — L97522 Non-pressure chronic ulcer of other part of left foot with fat layer exposed: Secondary | ICD-10-CM | POA: Diagnosis not present

## 2023-06-25 DIAGNOSIS — N1832 Chronic kidney disease, stage 3b: Secondary | ICD-10-CM | POA: Diagnosis not present

## 2023-06-25 DIAGNOSIS — E11621 Type 2 diabetes mellitus with foot ulcer: Secondary | ICD-10-CM | POA: Diagnosis not present

## 2023-06-25 DIAGNOSIS — E1122 Type 2 diabetes mellitus with diabetic chronic kidney disease: Secondary | ICD-10-CM | POA: Diagnosis not present

## 2023-06-25 DIAGNOSIS — N390 Urinary tract infection, site not specified: Secondary | ICD-10-CM | POA: Diagnosis not present

## 2023-06-25 DIAGNOSIS — N189 Chronic kidney disease, unspecified: Secondary | ICD-10-CM | POA: Diagnosis not present

## 2023-06-25 DIAGNOSIS — I129 Hypertensive chronic kidney disease with stage 1 through stage 4 chronic kidney disease, or unspecified chronic kidney disease: Secondary | ICD-10-CM | POA: Diagnosis not present

## 2023-06-25 DIAGNOSIS — R9389 Abnormal findings on diagnostic imaging of other specified body structures: Secondary | ICD-10-CM

## 2023-06-25 DIAGNOSIS — I10 Essential (primary) hypertension: Secondary | ICD-10-CM | POA: Diagnosis not present

## 2023-06-25 DIAGNOSIS — L03116 Cellulitis of left lower limb: Secondary | ICD-10-CM | POA: Diagnosis not present

## 2023-06-25 DIAGNOSIS — J189 Pneumonia, unspecified organism: Secondary | ICD-10-CM | POA: Diagnosis not present
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
## 2023-06-26 DIAGNOSIS — F319 Bipolar disorder, unspecified: Secondary | ICD-10-CM | POA: Diagnosis not present

## 2023-06-26 DIAGNOSIS — Z87820 Personal history of traumatic brain injury: Secondary | ICD-10-CM | POA: Diagnosis not present

## 2023-06-26 DIAGNOSIS — F5101 Primary insomnia: Secondary | ICD-10-CM | POA: Diagnosis not present

## 2023-06-27 DIAGNOSIS — L03116 Cellulitis of left lower limb: Secondary | ICD-10-CM | POA: Diagnosis not present

## 2023-06-27 DIAGNOSIS — E1165 Type 2 diabetes mellitus with hyperglycemia: Secondary | ICD-10-CM | POA: Diagnosis not present

## 2023-06-27 DIAGNOSIS — L97522 Non-pressure chronic ulcer of other part of left foot with fat layer exposed: Secondary | ICD-10-CM | POA: Diagnosis not present

## 2023-06-27 DIAGNOSIS — I129 Hypertensive chronic kidney disease with stage 1 through stage 4 chronic kidney disease, or unspecified chronic kidney disease: Secondary | ICD-10-CM | POA: Diagnosis not present

## 2023-06-27 DIAGNOSIS — E1122 Type 2 diabetes mellitus with diabetic chronic kidney disease: Secondary | ICD-10-CM | POA: Diagnosis not present

## 2023-06-27 DIAGNOSIS — E11621 Type 2 diabetes mellitus with foot ulcer: Secondary | ICD-10-CM | POA: Diagnosis not present

## 2023-07-01 DIAGNOSIS — Z85828 Personal history of other malignant neoplasm of skin: Secondary | ICD-10-CM | POA: Diagnosis not present

## 2023-07-01 DIAGNOSIS — Z08 Encounter for follow-up examination after completed treatment for malignant neoplasm: Secondary | ICD-10-CM | POA: Diagnosis not present

## 2023-07-01 DIAGNOSIS — L905 Scar conditions and fibrosis of skin: Secondary | ICD-10-CM | POA: Diagnosis not present

## 2023-07-01 DIAGNOSIS — Z85820 Personal history of malignant melanoma of skin: Secondary | ICD-10-CM | POA: Diagnosis not present

## 2023-07-02 DIAGNOSIS — E11621 Type 2 diabetes mellitus with foot ulcer: Secondary | ICD-10-CM | POA: Diagnosis not present

## 2023-07-02 DIAGNOSIS — L97522 Non-pressure chronic ulcer of other part of left foot with fat layer exposed: Secondary | ICD-10-CM | POA: Diagnosis not present

## 2023-07-02 DIAGNOSIS — I129 Hypertensive chronic kidney disease with stage 1 through stage 4 chronic kidney disease, or unspecified chronic kidney disease: Secondary | ICD-10-CM | POA: Diagnosis not present

## 2023-07-02 DIAGNOSIS — E1165 Type 2 diabetes mellitus with hyperglycemia: Secondary | ICD-10-CM | POA: Diagnosis not present

## 2023-07-02 DIAGNOSIS — E1122 Type 2 diabetes mellitus with diabetic chronic kidney disease: Secondary | ICD-10-CM | POA: Diagnosis not present

## 2023-07-02 DIAGNOSIS — L03116 Cellulitis of left lower limb: Secondary | ICD-10-CM | POA: Diagnosis not present

## 2023-07-03 DIAGNOSIS — E1122 Type 2 diabetes mellitus with diabetic chronic kidney disease: Secondary | ICD-10-CM | POA: Diagnosis not present

## 2023-07-03 DIAGNOSIS — I129 Hypertensive chronic kidney disease with stage 1 through stage 4 chronic kidney disease, or unspecified chronic kidney disease: Secondary | ICD-10-CM | POA: Diagnosis not present

## 2023-07-03 DIAGNOSIS — L97522 Non-pressure chronic ulcer of other part of left foot with fat layer exposed: Secondary | ICD-10-CM | POA: Diagnosis not present

## 2023-07-03 DIAGNOSIS — E11621 Type 2 diabetes mellitus with foot ulcer: Secondary | ICD-10-CM | POA: Diagnosis not present

## 2023-07-03 DIAGNOSIS — L03116 Cellulitis of left lower limb: Secondary | ICD-10-CM | POA: Diagnosis not present

## 2023-07-03 DIAGNOSIS — E1165 Type 2 diabetes mellitus with hyperglycemia: Secondary | ICD-10-CM | POA: Diagnosis not present

## 2023-07-04 DIAGNOSIS — E1142 Type 2 diabetes mellitus with diabetic polyneuropathy: Secondary | ICD-10-CM | POA: Diagnosis not present

## 2023-07-04 DIAGNOSIS — I739 Peripheral vascular disease, unspecified: Secondary | ICD-10-CM | POA: Diagnosis not present

## 2023-07-04 DIAGNOSIS — L03116 Cellulitis of left lower limb: Secondary | ICD-10-CM | POA: Diagnosis not present

## 2023-07-08 DIAGNOSIS — E11621 Type 2 diabetes mellitus with foot ulcer: Secondary | ICD-10-CM | POA: Diagnosis not present

## 2023-07-08 DIAGNOSIS — L03116 Cellulitis of left lower limb: Secondary | ICD-10-CM | POA: Diagnosis not present

## 2023-07-08 DIAGNOSIS — E1122 Type 2 diabetes mellitus with diabetic chronic kidney disease: Secondary | ICD-10-CM | POA: Diagnosis not present

## 2023-07-08 DIAGNOSIS — L97522 Non-pressure chronic ulcer of other part of left foot with fat layer exposed: Secondary | ICD-10-CM | POA: Diagnosis not present

## 2023-07-08 DIAGNOSIS — E1165 Type 2 diabetes mellitus with hyperglycemia: Secondary | ICD-10-CM | POA: Diagnosis not present

## 2023-07-08 DIAGNOSIS — I129 Hypertensive chronic kidney disease with stage 1 through stage 4 chronic kidney disease, or unspecified chronic kidney disease: Secondary | ICD-10-CM | POA: Diagnosis not present

## 2023-07-10 DIAGNOSIS — E11621 Type 2 diabetes mellitus with foot ulcer: Secondary | ICD-10-CM | POA: Diagnosis not present

## 2023-07-10 DIAGNOSIS — E1122 Type 2 diabetes mellitus with diabetic chronic kidney disease: Secondary | ICD-10-CM | POA: Diagnosis not present

## 2023-07-10 DIAGNOSIS — E1142 Type 2 diabetes mellitus with diabetic polyneuropathy: Secondary | ICD-10-CM | POA: Diagnosis not present

## 2023-07-10 DIAGNOSIS — E1165 Type 2 diabetes mellitus with hyperglycemia: Secondary | ICD-10-CM | POA: Diagnosis not present

## 2023-07-10 DIAGNOSIS — L03116 Cellulitis of left lower limb: Secondary | ICD-10-CM | POA: Diagnosis not present

## 2023-07-10 DIAGNOSIS — I129 Hypertensive chronic kidney disease with stage 1 through stage 4 chronic kidney disease, or unspecified chronic kidney disease: Secondary | ICD-10-CM | POA: Diagnosis not present

## 2023-07-10 DIAGNOSIS — L97522 Non-pressure chronic ulcer of other part of left foot with fat layer exposed: Secondary | ICD-10-CM | POA: Diagnosis not present

## 2023-07-11 DIAGNOSIS — E1165 Type 2 diabetes mellitus with hyperglycemia: Secondary | ICD-10-CM | POA: Diagnosis not present

## 2023-07-11 DIAGNOSIS — L03116 Cellulitis of left lower limb: Secondary | ICD-10-CM | POA: Diagnosis not present

## 2023-07-11 DIAGNOSIS — I129 Hypertensive chronic kidney disease with stage 1 through stage 4 chronic kidney disease, or unspecified chronic kidney disease: Secondary | ICD-10-CM | POA: Diagnosis not present

## 2023-07-11 DIAGNOSIS — L97522 Non-pressure chronic ulcer of other part of left foot with fat layer exposed: Secondary | ICD-10-CM | POA: Diagnosis not present

## 2023-07-11 DIAGNOSIS — E11621 Type 2 diabetes mellitus with foot ulcer: Secondary | ICD-10-CM | POA: Diagnosis not present

## 2023-07-11 DIAGNOSIS — E1122 Type 2 diabetes mellitus with diabetic chronic kidney disease: Secondary | ICD-10-CM | POA: Diagnosis not present

## 2023-07-12 DIAGNOSIS — Z794 Long term (current) use of insulin: Secondary | ICD-10-CM | POA: Diagnosis not present

## 2023-07-12 DIAGNOSIS — J449 Chronic obstructive pulmonary disease, unspecified: Secondary | ICD-10-CM | POA: Diagnosis not present

## 2023-07-12 DIAGNOSIS — F319 Bipolar disorder, unspecified: Secondary | ICD-10-CM | POA: Diagnosis not present

## 2023-07-12 DIAGNOSIS — L97521 Non-pressure chronic ulcer of other part of left foot limited to breakdown of skin: Secondary | ICD-10-CM | POA: Diagnosis not present

## 2023-07-12 DIAGNOSIS — E11621 Type 2 diabetes mellitus with foot ulcer: Secondary | ICD-10-CM | POA: Diagnosis not present

## 2023-07-12 DIAGNOSIS — Z7984 Long term (current) use of oral hypoglycemic drugs: Secondary | ICD-10-CM | POA: Diagnosis not present

## 2023-07-12 DIAGNOSIS — L03116 Cellulitis of left lower limb: Secondary | ICD-10-CM | POA: Diagnosis not present

## 2023-07-12 DIAGNOSIS — Z9981 Dependence on supplemental oxygen: Secondary | ICD-10-CM | POA: Diagnosis not present

## 2023-07-12 DIAGNOSIS — E1122 Type 2 diabetes mellitus with diabetic chronic kidney disease: Secondary | ICD-10-CM | POA: Diagnosis not present

## 2023-07-12 DIAGNOSIS — E1165 Type 2 diabetes mellitus with hyperglycemia: Secondary | ICD-10-CM | POA: Diagnosis not present

## 2023-07-12 DIAGNOSIS — N189 Chronic kidney disease, unspecified: Secondary | ICD-10-CM | POA: Diagnosis not present

## 2023-07-12 DIAGNOSIS — I129 Hypertensive chronic kidney disease with stage 1 through stage 4 chronic kidney disease, or unspecified chronic kidney disease: Secondary | ICD-10-CM | POA: Diagnosis not present

## 2023-07-15 DIAGNOSIS — M79675 Pain in left toe(s): Secondary | ICD-10-CM | POA: Diagnosis not present

## 2023-07-15 DIAGNOSIS — M79671 Pain in right foot: Secondary | ICD-10-CM | POA: Diagnosis not present

## 2023-07-15 DIAGNOSIS — E1142 Type 2 diabetes mellitus with diabetic polyneuropathy: Secondary | ICD-10-CM | POA: Diagnosis not present

## 2023-07-15 DIAGNOSIS — M79674 Pain in right toe(s): Secondary | ICD-10-CM | POA: Diagnosis not present

## 2023-07-15 DIAGNOSIS — I739 Peripheral vascular disease, unspecified: Secondary | ICD-10-CM | POA: Diagnosis not present

## 2023-07-15 DIAGNOSIS — L110 Acquired keratosis follicularis: Secondary | ICD-10-CM | POA: Diagnosis not present

## 2023-07-15 DIAGNOSIS — M79672 Pain in left foot: Secondary | ICD-10-CM | POA: Diagnosis not present

## 2023-07-15 DIAGNOSIS — E1140 Type 2 diabetes mellitus with diabetic neuropathy, unspecified: Secondary | ICD-10-CM | POA: Diagnosis not present

## 2023-07-16 DIAGNOSIS — L97521 Non-pressure chronic ulcer of other part of left foot limited to breakdown of skin: Secondary | ICD-10-CM | POA: Diagnosis not present

## 2023-07-16 DIAGNOSIS — E11621 Type 2 diabetes mellitus with foot ulcer: Secondary | ICD-10-CM | POA: Diagnosis not present

## 2023-07-16 DIAGNOSIS — E1122 Type 2 diabetes mellitus with diabetic chronic kidney disease: Secondary | ICD-10-CM | POA: Diagnosis not present

## 2023-07-16 DIAGNOSIS — L03116 Cellulitis of left lower limb: Secondary | ICD-10-CM | POA: Diagnosis not present

## 2023-07-16 DIAGNOSIS — I129 Hypertensive chronic kidney disease with stage 1 through stage 4 chronic kidney disease, or unspecified chronic kidney disease: Secondary | ICD-10-CM | POA: Diagnosis not present

## 2023-07-16 DIAGNOSIS — E1165 Type 2 diabetes mellitus with hyperglycemia: Secondary | ICD-10-CM | POA: Diagnosis not present

## 2023-07-18 DIAGNOSIS — L03116 Cellulitis of left lower limb: Secondary | ICD-10-CM | POA: Diagnosis not present

## 2023-07-18 DIAGNOSIS — L97521 Non-pressure chronic ulcer of other part of left foot limited to breakdown of skin: Secondary | ICD-10-CM | POA: Diagnosis not present

## 2023-07-18 DIAGNOSIS — E11621 Type 2 diabetes mellitus with foot ulcer: Secondary | ICD-10-CM | POA: Diagnosis not present

## 2023-07-18 DIAGNOSIS — Z87820 Personal history of traumatic brain injury: Secondary | ICD-10-CM | POA: Diagnosis not present

## 2023-07-18 DIAGNOSIS — E1122 Type 2 diabetes mellitus with diabetic chronic kidney disease: Secondary | ICD-10-CM | POA: Diagnosis not present

## 2023-07-18 DIAGNOSIS — I129 Hypertensive chronic kidney disease with stage 1 through stage 4 chronic kidney disease, or unspecified chronic kidney disease: Secondary | ICD-10-CM | POA: Diagnosis not present

## 2023-07-18 DIAGNOSIS — F319 Bipolar disorder, unspecified: Secondary | ICD-10-CM | POA: Diagnosis not present

## 2023-07-18 DIAGNOSIS — E1165 Type 2 diabetes mellitus with hyperglycemia: Secondary | ICD-10-CM | POA: Diagnosis not present

## 2023-07-18 DIAGNOSIS — F5101 Primary insomnia: Secondary | ICD-10-CM | POA: Diagnosis not present

## 2023-07-22 DIAGNOSIS — L03116 Cellulitis of left lower limb: Secondary | ICD-10-CM | POA: Diagnosis not present

## 2023-07-22 DIAGNOSIS — N390 Urinary tract infection, site not specified: Secondary | ICD-10-CM | POA: Diagnosis not present

## 2023-07-22 DIAGNOSIS — E1122 Type 2 diabetes mellitus with diabetic chronic kidney disease: Secondary | ICD-10-CM | POA: Diagnosis not present

## 2023-07-22 DIAGNOSIS — L97521 Non-pressure chronic ulcer of other part of left foot limited to breakdown of skin: Secondary | ICD-10-CM | POA: Diagnosis not present

## 2023-07-22 DIAGNOSIS — E1165 Type 2 diabetes mellitus with hyperglycemia: Secondary | ICD-10-CM | POA: Diagnosis not present

## 2023-07-22 DIAGNOSIS — E11621 Type 2 diabetes mellitus with foot ulcer: Secondary | ICD-10-CM | POA: Diagnosis not present

## 2023-07-22 DIAGNOSIS — I129 Hypertensive chronic kidney disease with stage 1 through stage 4 chronic kidney disease, or unspecified chronic kidney disease: Secondary | ICD-10-CM | POA: Diagnosis not present

## 2023-07-25 DIAGNOSIS — I129 Hypertensive chronic kidney disease with stage 1 through stage 4 chronic kidney disease, or unspecified chronic kidney disease: Secondary | ICD-10-CM | POA: Diagnosis not present

## 2023-07-25 DIAGNOSIS — L97521 Non-pressure chronic ulcer of other part of left foot limited to breakdown of skin: Secondary | ICD-10-CM | POA: Diagnosis not present

## 2023-07-25 DIAGNOSIS — E1122 Type 2 diabetes mellitus with diabetic chronic kidney disease: Secondary | ICD-10-CM | POA: Diagnosis not present

## 2023-07-25 DIAGNOSIS — E1165 Type 2 diabetes mellitus with hyperglycemia: Secondary | ICD-10-CM | POA: Diagnosis not present

## 2023-07-25 DIAGNOSIS — L03116 Cellulitis of left lower limb: Secondary | ICD-10-CM | POA: Diagnosis not present

## 2023-07-25 DIAGNOSIS — E11621 Type 2 diabetes mellitus with foot ulcer: Secondary | ICD-10-CM | POA: Diagnosis not present

## 2023-07-29 DIAGNOSIS — E1165 Type 2 diabetes mellitus with hyperglycemia: Secondary | ICD-10-CM | POA: Diagnosis not present

## 2023-07-29 DIAGNOSIS — E1122 Type 2 diabetes mellitus with diabetic chronic kidney disease: Secondary | ICD-10-CM | POA: Diagnosis not present

## 2023-07-29 DIAGNOSIS — E11621 Type 2 diabetes mellitus with foot ulcer: Secondary | ICD-10-CM | POA: Diagnosis not present

## 2023-07-29 DIAGNOSIS — L97521 Non-pressure chronic ulcer of other part of left foot limited to breakdown of skin: Secondary | ICD-10-CM | POA: Diagnosis not present

## 2023-07-29 DIAGNOSIS — L03116 Cellulitis of left lower limb: Secondary | ICD-10-CM | POA: Diagnosis not present

## 2023-07-29 DIAGNOSIS — I129 Hypertensive chronic kidney disease with stage 1 through stage 4 chronic kidney disease, or unspecified chronic kidney disease: Secondary | ICD-10-CM | POA: Diagnosis not present

## 2023-07-30 DIAGNOSIS — L97521 Non-pressure chronic ulcer of other part of left foot limited to breakdown of skin: Secondary | ICD-10-CM | POA: Diagnosis not present

## 2023-07-30 DIAGNOSIS — E1165 Type 2 diabetes mellitus with hyperglycemia: Secondary | ICD-10-CM | POA: Diagnosis not present

## 2023-07-30 DIAGNOSIS — E1122 Type 2 diabetes mellitus with diabetic chronic kidney disease: Secondary | ICD-10-CM | POA: Diagnosis not present

## 2023-07-30 DIAGNOSIS — L03116 Cellulitis of left lower limb: Secondary | ICD-10-CM | POA: Diagnosis not present

## 2023-07-30 DIAGNOSIS — E11621 Type 2 diabetes mellitus with foot ulcer: Secondary | ICD-10-CM | POA: Diagnosis not present

## 2023-07-30 DIAGNOSIS — I129 Hypertensive chronic kidney disease with stage 1 through stage 4 chronic kidney disease, or unspecified chronic kidney disease: Secondary | ICD-10-CM | POA: Diagnosis not present

## 2023-08-02 DIAGNOSIS — E11621 Type 2 diabetes mellitus with foot ulcer: Secondary | ICD-10-CM | POA: Diagnosis not present

## 2023-08-02 DIAGNOSIS — E1122 Type 2 diabetes mellitus with diabetic chronic kidney disease: Secondary | ICD-10-CM | POA: Diagnosis not present

## 2023-08-02 DIAGNOSIS — L03116 Cellulitis of left lower limb: Secondary | ICD-10-CM | POA: Diagnosis not present

## 2023-08-02 DIAGNOSIS — E1165 Type 2 diabetes mellitus with hyperglycemia: Secondary | ICD-10-CM | POA: Diagnosis not present

## 2023-08-02 DIAGNOSIS — L97521 Non-pressure chronic ulcer of other part of left foot limited to breakdown of skin: Secondary | ICD-10-CM | POA: Diagnosis not present

## 2023-08-02 DIAGNOSIS — I129 Hypertensive chronic kidney disease with stage 1 through stage 4 chronic kidney disease, or unspecified chronic kidney disease: Secondary | ICD-10-CM | POA: Diagnosis not present

## 2023-08-05 DIAGNOSIS — E11621 Type 2 diabetes mellitus with foot ulcer: Secondary | ICD-10-CM | POA: Diagnosis not present

## 2023-08-05 DIAGNOSIS — L03116 Cellulitis of left lower limb: Secondary | ICD-10-CM | POA: Diagnosis not present

## 2023-08-05 DIAGNOSIS — E1165 Type 2 diabetes mellitus with hyperglycemia: Secondary | ICD-10-CM | POA: Diagnosis not present

## 2023-08-05 DIAGNOSIS — E1122 Type 2 diabetes mellitus with diabetic chronic kidney disease: Secondary | ICD-10-CM | POA: Diagnosis not present

## 2023-08-05 DIAGNOSIS — I129 Hypertensive chronic kidney disease with stage 1 through stage 4 chronic kidney disease, or unspecified chronic kidney disease: Secondary | ICD-10-CM | POA: Diagnosis not present

## 2023-08-05 DIAGNOSIS — L97521 Non-pressure chronic ulcer of other part of left foot limited to breakdown of skin: Secondary | ICD-10-CM | POA: Diagnosis not present

## 2023-08-06 DIAGNOSIS — E1165 Type 2 diabetes mellitus with hyperglycemia: Secondary | ICD-10-CM | POA: Diagnosis not present

## 2023-08-06 DIAGNOSIS — L97521 Non-pressure chronic ulcer of other part of left foot limited to breakdown of skin: Secondary | ICD-10-CM | POA: Diagnosis not present

## 2023-08-06 DIAGNOSIS — E1122 Type 2 diabetes mellitus with diabetic chronic kidney disease: Secondary | ICD-10-CM | POA: Diagnosis not present

## 2023-08-06 DIAGNOSIS — I129 Hypertensive chronic kidney disease with stage 1 through stage 4 chronic kidney disease, or unspecified chronic kidney disease: Secondary | ICD-10-CM | POA: Diagnosis not present

## 2023-08-06 DIAGNOSIS — L03116 Cellulitis of left lower limb: Secondary | ICD-10-CM | POA: Diagnosis not present

## 2023-08-06 DIAGNOSIS — E11621 Type 2 diabetes mellitus with foot ulcer: Secondary | ICD-10-CM | POA: Diagnosis not present

## 2023-08-08 DIAGNOSIS — L97521 Non-pressure chronic ulcer of other part of left foot limited to breakdown of skin: Secondary | ICD-10-CM | POA: Diagnosis not present

## 2023-08-08 DIAGNOSIS — E1165 Type 2 diabetes mellitus with hyperglycemia: Secondary | ICD-10-CM | POA: Diagnosis not present

## 2023-08-08 DIAGNOSIS — E1122 Type 2 diabetes mellitus with diabetic chronic kidney disease: Secondary | ICD-10-CM | POA: Diagnosis not present

## 2023-08-08 DIAGNOSIS — L03116 Cellulitis of left lower limb: Secondary | ICD-10-CM | POA: Diagnosis not present

## 2023-08-08 DIAGNOSIS — I129 Hypertensive chronic kidney disease with stage 1 through stage 4 chronic kidney disease, or unspecified chronic kidney disease: Secondary | ICD-10-CM | POA: Diagnosis not present

## 2023-08-08 DIAGNOSIS — E11621 Type 2 diabetes mellitus with foot ulcer: Secondary | ICD-10-CM | POA: Diagnosis not present

## 2023-08-09 ENCOUNTER — Other Ambulatory Visit: Payer: Self-pay | Admitting: *Deleted

## 2023-08-09 ENCOUNTER — Other Ambulatory Visit: Payer: Self-pay | Admitting: Urology

## 2023-08-09 DIAGNOSIS — E1165 Type 2 diabetes mellitus with hyperglycemia: Secondary | ICD-10-CM

## 2023-08-09 DIAGNOSIS — E1142 Type 2 diabetes mellitus with diabetic polyneuropathy: Secondary | ICD-10-CM | POA: Diagnosis not present

## 2023-08-09 DIAGNOSIS — Z7984 Long term (current) use of oral hypoglycemic drugs: Secondary | ICD-10-CM

## 2023-08-09 DIAGNOSIS — Z794 Long term (current) use of insulin: Secondary | ICD-10-CM

## 2023-08-09 DIAGNOSIS — I10 Essential (primary) hypertension: Secondary | ICD-10-CM | POA: Diagnosis not present

## 2023-08-09 MED ORDER — BASAGLAR KWIKPEN 100 UNIT/ML ~~LOC~~ SOPN: 60.0000 [IU] | PEN_INJECTOR | Freq: Every day | SUBCUTANEOUS | 0 refills | Status: DC

## 2023-08-09 NOTE — Telephone Encounter (Signed)
 Kayleen Party from [facility] called and said that the patient's insurance will no longer cover Lantus  but will cover the Basaglar . A prescription was sent in for this.

## 2023-08-11 DIAGNOSIS — Z7984 Long term (current) use of oral hypoglycemic drugs: Secondary | ICD-10-CM | POA: Diagnosis not present

## 2023-08-11 DIAGNOSIS — L97521 Non-pressure chronic ulcer of other part of left foot limited to breakdown of skin: Secondary | ICD-10-CM | POA: Diagnosis not present

## 2023-08-11 DIAGNOSIS — E11621 Type 2 diabetes mellitus with foot ulcer: Secondary | ICD-10-CM | POA: Diagnosis not present

## 2023-08-11 DIAGNOSIS — Z9981 Dependence on supplemental oxygen: Secondary | ICD-10-CM | POA: Diagnosis not present

## 2023-08-11 DIAGNOSIS — E1165 Type 2 diabetes mellitus with hyperglycemia: Secondary | ICD-10-CM | POA: Diagnosis not present

## 2023-08-11 DIAGNOSIS — E1122 Type 2 diabetes mellitus with diabetic chronic kidney disease: Secondary | ICD-10-CM | POA: Diagnosis not present

## 2023-08-11 DIAGNOSIS — J449 Chronic obstructive pulmonary disease, unspecified: Secondary | ICD-10-CM | POA: Diagnosis not present

## 2023-08-11 DIAGNOSIS — I129 Hypertensive chronic kidney disease with stage 1 through stage 4 chronic kidney disease, or unspecified chronic kidney disease: Secondary | ICD-10-CM | POA: Diagnosis not present

## 2023-08-11 DIAGNOSIS — L03116 Cellulitis of left lower limb: Secondary | ICD-10-CM | POA: Diagnosis not present

## 2023-08-11 DIAGNOSIS — F319 Bipolar disorder, unspecified: Secondary | ICD-10-CM | POA: Diagnosis not present

## 2023-08-11 DIAGNOSIS — Z794 Long term (current) use of insulin: Secondary | ICD-10-CM | POA: Diagnosis not present

## 2023-08-11 DIAGNOSIS — N189 Chronic kidney disease, unspecified: Secondary | ICD-10-CM | POA: Diagnosis not present

## 2023-08-13 DIAGNOSIS — E1122 Type 2 diabetes mellitus with diabetic chronic kidney disease: Secondary | ICD-10-CM | POA: Diagnosis not present

## 2023-08-13 DIAGNOSIS — E11621 Type 2 diabetes mellitus with foot ulcer: Secondary | ICD-10-CM | POA: Diagnosis not present

## 2023-08-13 DIAGNOSIS — E1165 Type 2 diabetes mellitus with hyperglycemia: Secondary | ICD-10-CM | POA: Diagnosis not present

## 2023-08-13 DIAGNOSIS — I129 Hypertensive chronic kidney disease with stage 1 through stage 4 chronic kidney disease, or unspecified chronic kidney disease: Secondary | ICD-10-CM | POA: Diagnosis not present

## 2023-08-13 DIAGNOSIS — L03116 Cellulitis of left lower limb: Secondary | ICD-10-CM | POA: Diagnosis not present

## 2023-08-13 DIAGNOSIS — L97521 Non-pressure chronic ulcer of other part of left foot limited to breakdown of skin: Secondary | ICD-10-CM | POA: Diagnosis not present

## 2023-08-15 DIAGNOSIS — E11621 Type 2 diabetes mellitus with foot ulcer: Secondary | ICD-10-CM | POA: Diagnosis not present

## 2023-08-15 DIAGNOSIS — E1122 Type 2 diabetes mellitus with diabetic chronic kidney disease: Secondary | ICD-10-CM | POA: Diagnosis not present

## 2023-08-15 DIAGNOSIS — L03116 Cellulitis of left lower limb: Secondary | ICD-10-CM | POA: Diagnosis not present

## 2023-08-15 DIAGNOSIS — I129 Hypertensive chronic kidney disease with stage 1 through stage 4 chronic kidney disease, or unspecified chronic kidney disease: Secondary | ICD-10-CM | POA: Diagnosis not present

## 2023-08-15 DIAGNOSIS — E1165 Type 2 diabetes mellitus with hyperglycemia: Secondary | ICD-10-CM | POA: Diagnosis not present

## 2023-08-15 DIAGNOSIS — F319 Bipolar disorder, unspecified: Secondary | ICD-10-CM | POA: Diagnosis not present

## 2023-08-15 DIAGNOSIS — L97521 Non-pressure chronic ulcer of other part of left foot limited to breakdown of skin: Secondary | ICD-10-CM | POA: Diagnosis not present

## 2023-08-15 DIAGNOSIS — F5101 Primary insomnia: Secondary | ICD-10-CM | POA: Diagnosis not present

## 2023-08-15 DIAGNOSIS — Z87820 Personal history of traumatic brain injury: Secondary | ICD-10-CM | POA: Diagnosis not present

## 2023-08-19 DIAGNOSIS — M6281 Muscle weakness (generalized): Secondary | ICD-10-CM | POA: Diagnosis not present

## 2023-08-21 ENCOUNTER — Encounter: Payer: Self-pay | Admitting: Nurse Practitioner

## 2023-08-21 ENCOUNTER — Ambulatory Visit (INDEPENDENT_AMBULATORY_CARE_PROVIDER_SITE_OTHER): Payer: Medicare Other | Admitting: Nurse Practitioner

## 2023-08-21 VITALS — BP 112/78 | HR 68 | Ht 69.0 in | Wt 211.0 lb

## 2023-08-21 DIAGNOSIS — Z7984 Long term (current) use of oral hypoglycemic drugs: Secondary | ICD-10-CM | POA: Diagnosis not present

## 2023-08-21 DIAGNOSIS — E1165 Type 2 diabetes mellitus with hyperglycemia: Secondary | ICD-10-CM

## 2023-08-21 DIAGNOSIS — I10 Essential (primary) hypertension: Secondary | ICD-10-CM | POA: Diagnosis not present

## 2023-08-21 DIAGNOSIS — E559 Vitamin D deficiency, unspecified: Secondary | ICD-10-CM

## 2023-08-21 DIAGNOSIS — M6281 Muscle weakness (generalized): Secondary | ICD-10-CM | POA: Diagnosis not present

## 2023-08-21 DIAGNOSIS — E782 Mixed hyperlipidemia: Secondary | ICD-10-CM | POA: Diagnosis not present

## 2023-08-21 DIAGNOSIS — Z794 Long term (current) use of insulin: Secondary | ICD-10-CM | POA: Diagnosis not present

## 2023-08-21 LAB — POCT GLYCOSYLATED HEMOGLOBIN (HGB A1C): Hemoglobin A1C: 7.8 % — AB (ref 4.0–5.6)

## 2023-08-21 MED ORDER — DROPSAFE SAFETY PEN NEEDLES 31G X 6 MM MISC: 3 refills | Status: AC

## 2023-08-21 MED ORDER — BASAGLAR KWIKPEN 100 UNIT/ML ~~LOC~~ SOPN: 60.0000 [IU] | PEN_INJECTOR | Freq: Every day | SUBCUTANEOUS | 3 refills | Status: DC

## 2023-08-21 MED ORDER — NOVOLOG FLEXPEN 100 UNIT/ML ~~LOC~~ SOPN: PEN_INJECTOR | SUBCUTANEOUS | 2 refills | Status: DC
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
## 2023-08-26 DIAGNOSIS — X32XXXD Exposure to sunlight, subsequent encounter: Secondary | ICD-10-CM | POA: Diagnosis not present

## 2023-08-26 DIAGNOSIS — Z08 Encounter for follow-up examination after completed treatment for malignant neoplasm: Secondary | ICD-10-CM | POA: Diagnosis not present

## 2023-08-26 DIAGNOSIS — M6281 Muscle weakness (generalized): Secondary | ICD-10-CM | POA: Diagnosis not present

## 2023-08-26 DIAGNOSIS — Z1283 Encounter for screening for malignant neoplasm of skin: Secondary | ICD-10-CM | POA: Diagnosis not present

## 2023-08-26 DIAGNOSIS — Z85828 Personal history of other malignant neoplasm of skin: Secondary | ICD-10-CM | POA: Diagnosis not present

## 2023-08-26 DIAGNOSIS — Z85820 Personal history of malignant melanoma of skin: Secondary | ICD-10-CM | POA: Diagnosis not present

## 2023-08-26 DIAGNOSIS — D225 Melanocytic nevi of trunk: Secondary | ICD-10-CM | POA: Diagnosis not present

## 2023-08-26 DIAGNOSIS — L570 Actinic keratosis: Secondary | ICD-10-CM | POA: Diagnosis not present

## 2023-08-29 DIAGNOSIS — M6281 Muscle weakness (generalized): Secondary | ICD-10-CM | POA: Diagnosis not present

## 2023-09-02 DIAGNOSIS — M6281 Muscle weakness (generalized): Secondary | ICD-10-CM | POA: Diagnosis not present

## 2023-09-03 ENCOUNTER — Other Ambulatory Visit (HOSPITAL_COMMUNITY): Payer: Self-pay | Admitting: Nephrology

## 2023-09-03 DIAGNOSIS — I5032 Chronic diastolic (congestive) heart failure: Secondary | ICD-10-CM | POA: Diagnosis not present

## 2023-09-03 DIAGNOSIS — N1831 Chronic kidney disease, stage 3a: Secondary | ICD-10-CM

## 2023-09-03 DIAGNOSIS — E1122 Type 2 diabetes mellitus with diabetic chronic kidney disease: Secondary | ICD-10-CM

## 2023-09-03 DIAGNOSIS — I129 Hypertensive chronic kidney disease with stage 1 through stage 4 chronic kidney disease, or unspecified chronic kidney disease: Secondary | ICD-10-CM | POA: Diagnosis not present

## 2023-09-03 DIAGNOSIS — N1832 Chronic kidney disease, stage 3b: Secondary | ICD-10-CM | POA: Diagnosis not present

## 2023-09-04 DIAGNOSIS — M6281 Muscle weakness (generalized): Secondary | ICD-10-CM | POA: Diagnosis not present

## 2023-09-06 DIAGNOSIS — M6281 Muscle weakness (generalized): Secondary | ICD-10-CM | POA: Diagnosis not present

## 2023-09-06 DIAGNOSIS — R278 Other lack of coordination: Secondary | ICD-10-CM | POA: Diagnosis not present

## 2023-09-09 DIAGNOSIS — R278 Other lack of coordination: Secondary | ICD-10-CM | POA: Diagnosis not present

## 2023-09-09 DIAGNOSIS — M6281 Muscle weakness (generalized): Secondary | ICD-10-CM | POA: Diagnosis not present

## 2023-09-10 ENCOUNTER — Ambulatory Visit (HOSPITAL_COMMUNITY)
Admission: RE | Admit: 2023-09-10 | Discharge: 2023-09-10 | Disposition: A | Discharge status: Home / Self Care | Source: Ambulatory Visit | Attending: Nephrology | Admitting: Nephrology

## 2023-09-10 DIAGNOSIS — N281 Cyst of kidney, acquired: Secondary | ICD-10-CM | POA: Diagnosis not present

## 2023-09-10 DIAGNOSIS — E1122 Type 2 diabetes mellitus with diabetic chronic kidney disease: Secondary | ICD-10-CM | POA: Insufficient documentation

## 2023-09-10 DIAGNOSIS — R9389 Abnormal findings on diagnostic imaging of other specified body structures: Secondary | ICD-10-CM | POA: Diagnosis not present

## 2023-09-10 DIAGNOSIS — N1831 Chronic kidney disease, stage 3a: Secondary | ICD-10-CM | POA: Insufficient documentation

## 2023-09-10 DIAGNOSIS — M6281 Muscle weakness (generalized): Secondary | ICD-10-CM | POA: Diagnosis not present

## 2023-09-11 ENCOUNTER — Ambulatory Visit (HOSPITAL_COMMUNITY)
Admission: RE | Admit: 2023-09-11 | Discharge: 2023-09-11 | Disposition: A | Discharge status: Home / Self Care | Source: Ambulatory Visit | Attending: Gerontology | Admitting: Gerontology

## 2023-09-11 DIAGNOSIS — E1122 Type 2 diabetes mellitus with diabetic chronic kidney disease: Secondary | ICD-10-CM | POA: Diagnosis not present

## 2023-09-11 DIAGNOSIS — N1831 Chronic kidney disease, stage 3a: Secondary | ICD-10-CM | POA: Diagnosis not present

## 2023-09-11 DIAGNOSIS — M6281 Muscle weakness (generalized): Secondary | ICD-10-CM | POA: Diagnosis not present

## 2023-09-11 DIAGNOSIS — R918 Other nonspecific abnormal finding of lung field: Secondary | ICD-10-CM | POA: Diagnosis not present

## 2023-09-11 DIAGNOSIS — J9811 Atelectasis: Secondary | ICD-10-CM | POA: Diagnosis not present

## 2023-09-11 DIAGNOSIS — R9389 Abnormal findings on diagnostic imaging of other specified body structures: Secondary | ICD-10-CM

## 2023-09-11 LAB — POCT I-STAT CREATININE: Creatinine, Ser: 2.1 mg/dL — ABNORMAL HIGH (ref 0.61–1.24)

## 2023-09-11 MED ORDER — IOHEXOL 300 MG/ML  SOLN
80.0000 mL | Freq: Once | INTRAMUSCULAR | Status: AC | PRN
  Administered 2023-09-11: 60 mL via INTRAVENOUS

## 2023-09-12 DIAGNOSIS — F5101 Primary insomnia: Secondary | ICD-10-CM | POA: Diagnosis not present

## 2023-09-12 DIAGNOSIS — Z87820 Personal history of traumatic brain injury: Secondary | ICD-10-CM | POA: Diagnosis not present

## 2023-09-12 DIAGNOSIS — M6281 Muscle weakness (generalized): Secondary | ICD-10-CM | POA: Diagnosis not present

## 2023-09-12 DIAGNOSIS — R278 Other lack of coordination: Secondary | ICD-10-CM | POA: Diagnosis not present

## 2023-09-12 DIAGNOSIS — F319 Bipolar disorder, unspecified: Secondary | ICD-10-CM | POA: Diagnosis not present

## 2023-09-15 DIAGNOSIS — M6281 Muscle weakness (generalized): Secondary | ICD-10-CM | POA: Diagnosis not present

## 2023-09-16 DIAGNOSIS — M6281 Muscle weakness (generalized): Secondary | ICD-10-CM | POA: Diagnosis not present

## 2023-09-16 DIAGNOSIS — R278 Other lack of coordination: Secondary | ICD-10-CM | POA: Diagnosis not present

## 2023-09-23 DIAGNOSIS — M6281 Muscle weakness (generalized): Secondary | ICD-10-CM | POA: Diagnosis not present

## 2023-09-23 DIAGNOSIS — R278 Other lack of coordination: Secondary | ICD-10-CM | POA: Diagnosis not present

## 2023-09-24 DIAGNOSIS — I739 Peripheral vascular disease, unspecified: Secondary | ICD-10-CM | POA: Diagnosis not present

## 2023-09-24 DIAGNOSIS — M79671 Pain in right foot: Secondary | ICD-10-CM | POA: Diagnosis not present

## 2023-09-24 DIAGNOSIS — E1140 Type 2 diabetes mellitus with diabetic neuropathy, unspecified: Secondary | ICD-10-CM | POA: Diagnosis not present

## 2023-09-24 DIAGNOSIS — M79674 Pain in right toe(s): Secondary | ICD-10-CM | POA: Diagnosis not present

## 2023-09-24 DIAGNOSIS — M79672 Pain in left foot: Secondary | ICD-10-CM | POA: Diagnosis not present

## 2023-09-24 DIAGNOSIS — E1142 Type 2 diabetes mellitus with diabetic polyneuropathy: Secondary | ICD-10-CM | POA: Diagnosis not present

## 2023-09-24 DIAGNOSIS — M79675 Pain in left toe(s): Secondary | ICD-10-CM | POA: Diagnosis not present

## 2023-09-24 DIAGNOSIS — M6281 Muscle weakness (generalized): Secondary | ICD-10-CM | POA: Diagnosis not present

## 2023-09-24 DIAGNOSIS — L110 Acquired keratosis follicularis: Secondary | ICD-10-CM | POA: Diagnosis not present

## 2023-09-25 DIAGNOSIS — N1832 Chronic kidney disease, stage 3b: Secondary | ICD-10-CM | POA: Diagnosis not present

## 2023-09-25 DIAGNOSIS — I10 Essential (primary) hypertension: Secondary | ICD-10-CM | POA: Diagnosis not present

## 2023-09-25 DIAGNOSIS — M6281 Muscle weakness (generalized): Secondary | ICD-10-CM | POA: Diagnosis not present

## 2023-09-25 DIAGNOSIS — J449 Chronic obstructive pulmonary disease, unspecified: Secondary | ICD-10-CM | POA: Diagnosis not present

## 2023-09-25 DIAGNOSIS — F3163 Bipolar disorder, current episode mixed, severe, without psychotic features: Secondary | ICD-10-CM | POA: Diagnosis not present

## 2023-09-25 DIAGNOSIS — E1142 Type 2 diabetes mellitus with diabetic polyneuropathy: Secondary | ICD-10-CM | POA: Diagnosis not present

## 2023-09-27 DIAGNOSIS — M6281 Muscle weakness (generalized): Secondary | ICD-10-CM | POA: Diagnosis not present

## 2023-09-27 DIAGNOSIS — R278 Other lack of coordination: Secondary | ICD-10-CM | POA: Diagnosis not present

## 2023-09-30 DIAGNOSIS — M6281 Muscle weakness (generalized): Secondary | ICD-10-CM | POA: Diagnosis not present

## 2023-10-01 DIAGNOSIS — R278 Other lack of coordination: Secondary | ICD-10-CM | POA: Diagnosis not present

## 2023-10-01 DIAGNOSIS — M6281 Muscle weakness (generalized): Secondary | ICD-10-CM | POA: Diagnosis not present

## 2023-10-02 DIAGNOSIS — M6281 Muscle weakness (generalized): Secondary | ICD-10-CM | POA: Diagnosis not present

## 2023-10-07 DIAGNOSIS — M6281 Muscle weakness (generalized): Secondary | ICD-10-CM | POA: Diagnosis not present

## 2023-10-07 DIAGNOSIS — R278 Other lack of coordination: Secondary | ICD-10-CM | POA: Diagnosis not present

## 2023-10-08 ENCOUNTER — Other Ambulatory Visit: Payer: Self-pay | Admitting: Urology

## 2023-10-09 DIAGNOSIS — M6281 Muscle weakness (generalized): Secondary | ICD-10-CM | POA: Diagnosis not present

## 2023-10-10 DIAGNOSIS — F5101 Primary insomnia: Secondary | ICD-10-CM | POA: Diagnosis not present

## 2023-10-10 DIAGNOSIS — Z87820 Personal history of traumatic brain injury: Secondary | ICD-10-CM | POA: Diagnosis not present

## 2023-10-10 DIAGNOSIS — F319 Bipolar disorder, unspecified: Secondary | ICD-10-CM | POA: Diagnosis not present

## 2023-10-11 DIAGNOSIS — M6281 Muscle weakness (generalized): Secondary | ICD-10-CM | POA: Diagnosis not present

## 2023-10-11 DIAGNOSIS — R278 Other lack of coordination: Secondary | ICD-10-CM | POA: Diagnosis not present

## 2023-10-14 DIAGNOSIS — H25812 Combined forms of age-related cataract, left eye: Secondary | ICD-10-CM | POA: Diagnosis not present

## 2023-10-14 DIAGNOSIS — M6281 Muscle weakness (generalized): Secondary | ICD-10-CM | POA: Diagnosis not present

## 2023-10-14 DIAGNOSIS — H5010 Unspecified exotropia: Secondary | ICD-10-CM | POA: Diagnosis not present

## 2023-10-14 DIAGNOSIS — R278 Other lack of coordination: Secondary | ICD-10-CM | POA: Diagnosis not present

## 2023-10-15 DIAGNOSIS — M6281 Muscle weakness (generalized): Secondary | ICD-10-CM | POA: Diagnosis not present

## 2023-10-16 DIAGNOSIS — R278 Other lack of coordination: Secondary | ICD-10-CM | POA: Diagnosis not present

## 2023-10-16 DIAGNOSIS — M6281 Muscle weakness (generalized): Secondary | ICD-10-CM | POA: Diagnosis not present

## 2023-10-17 DIAGNOSIS — M6281 Muscle weakness (generalized): Secondary | ICD-10-CM | POA: Diagnosis not present

## 2023-10-18 DIAGNOSIS — M6281 Muscle weakness (generalized): Secondary | ICD-10-CM | POA: Diagnosis not present

## 2023-10-18 DIAGNOSIS — R278 Other lack of coordination: Secondary | ICD-10-CM | POA: Diagnosis not present

## 2023-10-21 DIAGNOSIS — R278 Other lack of coordination: Secondary | ICD-10-CM | POA: Diagnosis not present

## 2023-10-21 DIAGNOSIS — M6281 Muscle weakness (generalized): Secondary | ICD-10-CM | POA: Diagnosis not present

## 2023-10-25 DIAGNOSIS — M6281 Muscle weakness (generalized): Secondary | ICD-10-CM | POA: Diagnosis not present

## 2023-10-25 DIAGNOSIS — R278 Other lack of coordination: Secondary | ICD-10-CM | POA: Diagnosis not present

## 2023-10-25 DIAGNOSIS — E1142 Type 2 diabetes mellitus with diabetic polyneuropathy: Secondary | ICD-10-CM | POA: Diagnosis not present

## 2023-10-25 DIAGNOSIS — I10 Essential (primary) hypertension: Secondary | ICD-10-CM | POA: Diagnosis not present

## 2023-10-28 DIAGNOSIS — R278 Other lack of coordination: Secondary | ICD-10-CM | POA: Diagnosis not present

## 2023-10-28 DIAGNOSIS — M6281 Muscle weakness (generalized): Secondary | ICD-10-CM | POA: Diagnosis not present

## 2023-10-28 DIAGNOSIS — H25813 Combined forms of age-related cataract, bilateral: Secondary | ICD-10-CM | POA: Diagnosis not present

## 2023-10-28 DIAGNOSIS — H5010 Unspecified exotropia: Secondary | ICD-10-CM | POA: Diagnosis not present

## 2023-10-29 ENCOUNTER — Other Ambulatory Visit: Payer: Self-pay | Admitting: Nurse Practitioner

## 2023-10-29 DIAGNOSIS — Z794 Long term (current) use of insulin: Secondary | ICD-10-CM

## 2023-10-29 DIAGNOSIS — E1165 Type 2 diabetes mellitus with hyperglycemia: Secondary | ICD-10-CM

## 2023-10-29 DIAGNOSIS — Z7984 Long term (current) use of oral hypoglycemic drugs: Secondary | ICD-10-CM

## 2023-10-29 MED ORDER — LANCETS MISC: 1.0000 | Freq: Four times a day (QID) | 2 refills | Status: AC

## 2023-10-30 DIAGNOSIS — M6281 Muscle weakness (generalized): Secondary | ICD-10-CM | POA: Diagnosis not present

## 2023-10-30 DIAGNOSIS — R278 Other lack of coordination: Secondary | ICD-10-CM | POA: Diagnosis not present

## 2023-10-31 DIAGNOSIS — M6281 Muscle weakness (generalized): Secondary | ICD-10-CM | POA: Diagnosis not present

## 2023-11-01 DIAGNOSIS — M6281 Muscle weakness (generalized): Secondary | ICD-10-CM | POA: Diagnosis not present

## 2023-11-04 DIAGNOSIS — R278 Other lack of coordination: Secondary | ICD-10-CM | POA: Diagnosis not present

## 2023-11-04 DIAGNOSIS — M6281 Muscle weakness (generalized): Secondary | ICD-10-CM | POA: Diagnosis not present

## 2023-11-06 DIAGNOSIS — R278 Other lack of coordination: Secondary | ICD-10-CM | POA: Diagnosis not present

## 2023-11-06 DIAGNOSIS — M6281 Muscle weakness (generalized): Secondary | ICD-10-CM | POA: Diagnosis not present

## 2023-11-07 DIAGNOSIS — F5101 Primary insomnia: Secondary | ICD-10-CM | POA: Diagnosis not present

## 2023-11-07 DIAGNOSIS — Z87820 Personal history of traumatic brain injury: Secondary | ICD-10-CM | POA: Diagnosis not present

## 2023-11-07 DIAGNOSIS — F064 Anxiety disorder due to known physiological condition: Secondary | ICD-10-CM | POA: Diagnosis not present

## 2023-11-07 DIAGNOSIS — M6281 Muscle weakness (generalized): Secondary | ICD-10-CM | POA: Diagnosis not present

## 2023-11-07 DIAGNOSIS — F319 Bipolar disorder, unspecified: Secondary | ICD-10-CM | POA: Diagnosis not present

## 2023-11-08 DIAGNOSIS — M6281 Muscle weakness (generalized): Secondary | ICD-10-CM | POA: Diagnosis not present

## 2023-11-11 DIAGNOSIS — R278 Other lack of coordination: Secondary | ICD-10-CM | POA: Diagnosis not present

## 2023-11-11 DIAGNOSIS — M6281 Muscle weakness (generalized): Secondary | ICD-10-CM | POA: Diagnosis not present

## 2023-11-12 DIAGNOSIS — M6281 Muscle weakness (generalized): Secondary | ICD-10-CM | POA: Diagnosis not present

## 2023-11-13 DIAGNOSIS — M6281 Muscle weakness (generalized): Secondary | ICD-10-CM | POA: Diagnosis not present

## 2023-11-13 DIAGNOSIS — R278 Other lack of coordination: Secondary | ICD-10-CM | POA: Diagnosis not present

## 2023-11-14 DIAGNOSIS — M6281 Muscle weakness (generalized): Secondary | ICD-10-CM | POA: Diagnosis not present

## 2023-11-18 DIAGNOSIS — R278 Other lack of coordination: Secondary | ICD-10-CM | POA: Diagnosis not present

## 2023-11-18 DIAGNOSIS — M6281 Muscle weakness (generalized): Secondary | ICD-10-CM | POA: Diagnosis not present

## 2023-11-19 DIAGNOSIS — M6281 Muscle weakness (generalized): Secondary | ICD-10-CM | POA: Diagnosis not present

## 2023-11-20 DIAGNOSIS — Z08 Encounter for follow-up examination after completed treatment for malignant neoplasm: Secondary | ICD-10-CM | POA: Diagnosis not present

## 2023-11-20 DIAGNOSIS — L570 Actinic keratosis: Secondary | ICD-10-CM | POA: Diagnosis not present

## 2023-11-20 DIAGNOSIS — Z1283 Encounter for screening for malignant neoplasm of skin: Secondary | ICD-10-CM | POA: Diagnosis not present

## 2023-11-20 DIAGNOSIS — D485 Neoplasm of uncertain behavior of skin: Secondary | ICD-10-CM | POA: Diagnosis not present

## 2023-11-20 DIAGNOSIS — L820 Inflamed seborrheic keratosis: Secondary | ICD-10-CM | POA: Diagnosis not present

## 2023-11-20 DIAGNOSIS — B078 Other viral warts: Secondary | ICD-10-CM | POA: Diagnosis not present

## 2023-11-20 DIAGNOSIS — X32XXXD Exposure to sunlight, subsequent encounter: Secondary | ICD-10-CM | POA: Diagnosis not present

## 2023-11-20 DIAGNOSIS — D225 Melanocytic nevi of trunk: Secondary | ICD-10-CM | POA: Diagnosis not present

## 2023-11-20 DIAGNOSIS — Z85820 Personal history of malignant melanoma of skin: Secondary | ICD-10-CM | POA: Diagnosis not present

## 2023-11-21 DIAGNOSIS — M6281 Muscle weakness (generalized): Secondary | ICD-10-CM | POA: Diagnosis not present

## 2023-11-22 DIAGNOSIS — M6281 Muscle weakness (generalized): Secondary | ICD-10-CM | POA: Diagnosis not present

## 2023-11-22 DIAGNOSIS — R278 Other lack of coordination: Secondary | ICD-10-CM | POA: Diagnosis not present

## 2023-11-25 DIAGNOSIS — I10 Essential (primary) hypertension: Secondary | ICD-10-CM | POA: Diagnosis not present

## 2023-11-25 DIAGNOSIS — M6281 Muscle weakness (generalized): Secondary | ICD-10-CM | POA: Diagnosis not present

## 2023-11-25 DIAGNOSIS — R278 Other lack of coordination: Secondary | ICD-10-CM | POA: Diagnosis not present

## 2023-11-25 DIAGNOSIS — E1142 Type 2 diabetes mellitus with diabetic polyneuropathy: Secondary | ICD-10-CM | POA: Diagnosis not present

## 2023-11-26 DIAGNOSIS — N189 Chronic kidney disease, unspecified: Secondary | ICD-10-CM | POA: Diagnosis not present

## 2023-11-26 DIAGNOSIS — N1832 Chronic kidney disease, stage 3b: Secondary | ICD-10-CM | POA: Diagnosis not present

## 2023-11-26 DIAGNOSIS — M6281 Muscle weakness (generalized): Secondary | ICD-10-CM | POA: Diagnosis not present

## 2023-11-26 DIAGNOSIS — I10 Essential (primary) hypertension: Secondary | ICD-10-CM | POA: Diagnosis not present

## 2023-11-26 DIAGNOSIS — M545 Low back pain, unspecified: Secondary | ICD-10-CM | POA: Diagnosis not present

## 2023-11-26 DIAGNOSIS — E1142 Type 2 diabetes mellitus with diabetic polyneuropathy: Secondary | ICD-10-CM | POA: Diagnosis not present

## 2023-11-27 DIAGNOSIS — R278 Other lack of coordination: Secondary | ICD-10-CM | POA: Diagnosis not present

## 2023-11-27 DIAGNOSIS — N390 Urinary tract infection, site not specified: Secondary | ICD-10-CM | POA: Diagnosis not present

## 2023-11-27 DIAGNOSIS — M6281 Muscle weakness (generalized): Secondary | ICD-10-CM | POA: Diagnosis not present

## 2023-11-27 DIAGNOSIS — E1165 Type 2 diabetes mellitus with hyperglycemia: Secondary | ICD-10-CM | POA: Diagnosis not present

## 2023-11-27 DIAGNOSIS — N1832 Chronic kidney disease, stage 3b: Secondary | ICD-10-CM | POA: Diagnosis not present

## 2023-11-28 DIAGNOSIS — M6281 Muscle weakness (generalized): Secondary | ICD-10-CM | POA: Diagnosis not present

## 2023-12-01 DIAGNOSIS — Z87820 Personal history of traumatic brain injury: Secondary | ICD-10-CM | POA: Diagnosis not present

## 2023-12-01 DIAGNOSIS — F319 Bipolar disorder, unspecified: Secondary | ICD-10-CM | POA: Diagnosis not present

## 2023-12-01 DIAGNOSIS — F064 Anxiety disorder due to known physiological condition: Secondary | ICD-10-CM | POA: Diagnosis not present

## 2023-12-01 DIAGNOSIS — F5101 Primary insomnia: Secondary | ICD-10-CM | POA: Diagnosis not present

## 2023-12-02 DIAGNOSIS — M6281 Muscle weakness (generalized): Secondary | ICD-10-CM | POA: Diagnosis not present

## 2023-12-02 DIAGNOSIS — R278 Other lack of coordination: Secondary | ICD-10-CM | POA: Diagnosis not present

## 2023-12-03 DIAGNOSIS — I10 Essential (primary) hypertension: Secondary | ICD-10-CM | POA: Diagnosis not present

## 2023-12-03 DIAGNOSIS — N1832 Chronic kidney disease, stage 3b: Secondary | ICD-10-CM | POA: Diagnosis not present

## 2023-12-03 DIAGNOSIS — M6281 Muscle weakness (generalized): Secondary | ICD-10-CM | POA: Diagnosis not present

## 2023-12-03 DIAGNOSIS — N390 Urinary tract infection, site not specified: Secondary | ICD-10-CM | POA: Diagnosis not present

## 2023-12-03 DIAGNOSIS — E1142 Type 2 diabetes mellitus with diabetic polyneuropathy: Secondary | ICD-10-CM | POA: Diagnosis not present

## 2023-12-04 ENCOUNTER — Other Ambulatory Visit: Payer: Self-pay | Admitting: Urology

## 2023-12-04 DIAGNOSIS — N189 Chronic kidney disease, unspecified: Secondary | ICD-10-CM | POA: Diagnosis not present

## 2023-12-04 DIAGNOSIS — R778 Other specified abnormalities of plasma proteins: Secondary | ICD-10-CM | POA: Diagnosis not present

## 2023-12-04 DIAGNOSIS — M6281 Muscle weakness (generalized): Secondary | ICD-10-CM | POA: Diagnosis not present

## 2023-12-04 DIAGNOSIS — R278 Other lack of coordination: Secondary | ICD-10-CM | POA: Diagnosis not present

## 2023-12-05 DIAGNOSIS — M6281 Muscle weakness (generalized): Secondary | ICD-10-CM | POA: Diagnosis not present

## 2023-12-05 DIAGNOSIS — N189 Chronic kidney disease, unspecified: Secondary | ICD-10-CM | POA: Diagnosis not present

## 2023-12-05 DIAGNOSIS — R778 Other specified abnormalities of plasma proteins: Secondary | ICD-10-CM | POA: Diagnosis not present

## 2023-12-10 DIAGNOSIS — M6281 Muscle weakness (generalized): Secondary | ICD-10-CM | POA: Diagnosis not present

## 2023-12-11 DIAGNOSIS — M79672 Pain in left foot: Secondary | ICD-10-CM | POA: Diagnosis not present

## 2023-12-11 DIAGNOSIS — M79675 Pain in left toe(s): Secondary | ICD-10-CM | POA: Diagnosis not present

## 2023-12-11 DIAGNOSIS — M79671 Pain in right foot: Secondary | ICD-10-CM | POA: Diagnosis not present

## 2023-12-11 DIAGNOSIS — R278 Other lack of coordination: Secondary | ICD-10-CM | POA: Diagnosis not present

## 2023-12-11 DIAGNOSIS — M79674 Pain in right toe(s): Secondary | ICD-10-CM | POA: Diagnosis not present

## 2023-12-11 DIAGNOSIS — I739 Peripheral vascular disease, unspecified: Secondary | ICD-10-CM | POA: Diagnosis not present

## 2023-12-11 DIAGNOSIS — E1142 Type 2 diabetes mellitus with diabetic polyneuropathy: Secondary | ICD-10-CM | POA: Diagnosis not present

## 2023-12-11 DIAGNOSIS — E1140 Type 2 diabetes mellitus with diabetic neuropathy, unspecified: Secondary | ICD-10-CM | POA: Diagnosis not present

## 2023-12-11 DIAGNOSIS — L110 Acquired keratosis follicularis: Secondary | ICD-10-CM | POA: Diagnosis not present

## 2023-12-11 DIAGNOSIS — M6281 Muscle weakness (generalized): Secondary | ICD-10-CM | POA: Diagnosis not present

## 2023-12-12 DIAGNOSIS — M6281 Muscle weakness (generalized): Secondary | ICD-10-CM | POA: Diagnosis not present

## 2023-12-13 ENCOUNTER — Ambulatory Visit: Admitting: Urology

## 2023-12-13 DIAGNOSIS — M6281 Muscle weakness (generalized): Secondary | ICD-10-CM | POA: Diagnosis not present

## 2023-12-13 DIAGNOSIS — R278 Other lack of coordination: Secondary | ICD-10-CM | POA: Diagnosis not present

## 2023-12-13 DIAGNOSIS — N390 Urinary tract infection, site not specified: Secondary | ICD-10-CM | POA: Diagnosis not present

## 2023-12-16 ENCOUNTER — Ambulatory Visit (INDEPENDENT_AMBULATORY_CARE_PROVIDER_SITE_OTHER): Admitting: Urology

## 2023-12-16 ENCOUNTER — Encounter: Payer: Self-pay | Admitting: Urology

## 2023-12-16 VITALS — BP 132/81 | HR 89

## 2023-12-16 DIAGNOSIS — N138 Other obstructive and reflux uropathy: Secondary | ICD-10-CM | POA: Diagnosis not present

## 2023-12-16 DIAGNOSIS — N401 Enlarged prostate with lower urinary tract symptoms: Secondary | ICD-10-CM

## 2023-12-16 DIAGNOSIS — M6281 Muscle weakness (generalized): Secondary | ICD-10-CM | POA: Diagnosis not present

## 2023-12-16 DIAGNOSIS — R278 Other lack of coordination: Secondary | ICD-10-CM | POA: Diagnosis not present

## 2023-12-16 DIAGNOSIS — R339 Retention of urine, unspecified: Secondary | ICD-10-CM | POA: Diagnosis not present

## 2023-12-16 LAB — URINALYSIS, ROUTINE W REFLEX MICROSCOPIC
Bilirubin, UA: NEGATIVE
Glucose, UA: NEGATIVE
Ketones, UA: NEGATIVE
Leukocytes,UA: NEGATIVE
Nitrite, UA: NEGATIVE
RBC, UA: NEGATIVE
Specific Gravity, UA: 1.01 (ref 1.005–1.030)
Urobilinogen, Ur: 0.2 mg/dL (ref 0.2–1.0)
pH, UA: 6.5 (ref 5.0–7.5)

## 2023-12-16 LAB — MICROSCOPIC EXAMINATION
Bacteria, UA: NONE SEEN
WBC, UA: NONE SEEN /HPF (ref 0–5)

## 2023-12-16 LAB — BLADDER SCAN AMB NON-IMAGING: Scan Result: 477

## 2023-12-16 MED ORDER — FINASTERIDE 5 MG PO TABS: 5.0000 mg | ORAL_TABLET | Freq: Every day | ORAL | 3 refills | Status: AC

## 2023-12-16 MED ORDER — SILODOSIN 8 MG PO CAPS: 8.0000 mg | ORAL_CAPSULE | Freq: Every day | ORAL | 11 refills | Status: AC

## 2023-12-16 NOTE — Patient Instructions (Signed)
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
## 2023-12-17 DIAGNOSIS — M6281 Muscle weakness (generalized): Secondary | ICD-10-CM | POA: Diagnosis not present

## 2023-12-18 DIAGNOSIS — M6281 Muscle weakness (generalized): Secondary | ICD-10-CM | POA: Diagnosis not present

## 2023-12-18 DIAGNOSIS — R278 Other lack of coordination: Secondary | ICD-10-CM | POA: Diagnosis not present

## 2023-12-18 DIAGNOSIS — Z23 Encounter for immunization: Secondary | ICD-10-CM | POA: Diagnosis not present

## 2023-12-19 DIAGNOSIS — M6281 Muscle weakness (generalized): Secondary | ICD-10-CM | POA: Diagnosis not present

## 2023-12-23 DIAGNOSIS — E1129 Type 2 diabetes mellitus with other diabetic kidney complication: Secondary | ICD-10-CM | POA: Diagnosis not present

## 2023-12-23 DIAGNOSIS — R809 Proteinuria, unspecified: Secondary | ICD-10-CM | POA: Diagnosis not present

## 2023-12-23 DIAGNOSIS — N1832 Chronic kidney disease, stage 3b: Secondary | ICD-10-CM | POA: Diagnosis not present

## 2023-12-23 DIAGNOSIS — E1122 Type 2 diabetes mellitus with diabetic chronic kidney disease: Secondary | ICD-10-CM | POA: Diagnosis not present

## 2023-12-23 DIAGNOSIS — M6281 Muscle weakness (generalized): Secondary | ICD-10-CM | POA: Diagnosis not present

## 2023-12-24 ENCOUNTER — Ambulatory Visit (INDEPENDENT_AMBULATORY_CARE_PROVIDER_SITE_OTHER): Admitting: Nurse Practitioner

## 2023-12-24 ENCOUNTER — Encounter: Payer: Self-pay | Admitting: Nurse Practitioner

## 2023-12-24 VITALS — BP 118/68 | HR 75 | Ht 69.0 in | Wt 221.6 lb

## 2023-12-24 DIAGNOSIS — I10 Essential (primary) hypertension: Secondary | ICD-10-CM | POA: Diagnosis not present

## 2023-12-24 DIAGNOSIS — E782 Mixed hyperlipidemia: Secondary | ICD-10-CM | POA: Diagnosis not present

## 2023-12-24 DIAGNOSIS — M6281 Muscle weakness (generalized): Secondary | ICD-10-CM | POA: Diagnosis not present

## 2023-12-24 DIAGNOSIS — Z794 Long term (current) use of insulin: Secondary | ICD-10-CM | POA: Diagnosis not present

## 2023-12-24 DIAGNOSIS — Z7984 Long term (current) use of oral hypoglycemic drugs: Secondary | ICD-10-CM

## 2023-12-24 DIAGNOSIS — E559 Vitamin D deficiency, unspecified: Secondary | ICD-10-CM | POA: Diagnosis not present

## 2023-12-24 DIAGNOSIS — E1165 Type 2 diabetes mellitus with hyperglycemia: Secondary | ICD-10-CM
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
## 2023-12-25 DIAGNOSIS — M6281 Muscle weakness (generalized): Secondary | ICD-10-CM | POA: Diagnosis not present

## 2023-12-25 DIAGNOSIS — R278 Other lack of coordination: Secondary | ICD-10-CM | POA: Diagnosis not present

## 2023-12-27 DIAGNOSIS — M6281 Muscle weakness (generalized): Secondary | ICD-10-CM | POA: Diagnosis not present

## 2023-12-27 DIAGNOSIS — R278 Other lack of coordination: Secondary | ICD-10-CM | POA: Diagnosis not present

## 2023-12-30 DIAGNOSIS — M6281 Muscle weakness (generalized): Secondary | ICD-10-CM | POA: Diagnosis not present

## 2023-12-30 DIAGNOSIS — R278 Other lack of coordination: Secondary | ICD-10-CM | POA: Diagnosis not present

## 2023-12-31 DIAGNOSIS — R809 Proteinuria, unspecified: Secondary | ICD-10-CM | POA: Diagnosis not present

## 2023-12-31 DIAGNOSIS — E119 Type 2 diabetes mellitus without complications: Secondary | ICD-10-CM | POA: Diagnosis not present

## 2023-12-31 DIAGNOSIS — E211 Secondary hyperparathyroidism, not elsewhere classified: Secondary | ICD-10-CM | POA: Diagnosis not present

## 2023-12-31 DIAGNOSIS — D631 Anemia in chronic kidney disease: Secondary | ICD-10-CM | POA: Diagnosis not present

## 2023-12-31 DIAGNOSIS — N189 Chronic kidney disease, unspecified: Secondary | ICD-10-CM | POA: Diagnosis not present

## 2023-12-31 DIAGNOSIS — M6281 Muscle weakness (generalized): Secondary | ICD-10-CM | POA: Diagnosis not present

## 2023-12-31 DIAGNOSIS — I10 Essential (primary) hypertension: Secondary | ICD-10-CM | POA: Diagnosis not present

## 2024-01-02 DIAGNOSIS — M6281 Muscle weakness (generalized): Secondary | ICD-10-CM | POA: Diagnosis not present

## 2024-01-02 DIAGNOSIS — Z87820 Personal history of traumatic brain injury: Secondary | ICD-10-CM | POA: Diagnosis not present

## 2024-01-02 DIAGNOSIS — F319 Bipolar disorder, unspecified: Secondary | ICD-10-CM | POA: Diagnosis not present

## 2024-01-02 DIAGNOSIS — F5101 Primary insomnia: Secondary | ICD-10-CM | POA: Diagnosis not present

## 2024-01-03 DIAGNOSIS — I10 Essential (primary) hypertension: Secondary | ICD-10-CM | POA: Diagnosis not present

## 2024-01-03 DIAGNOSIS — E1142 Type 2 diabetes mellitus with diabetic polyneuropathy: Secondary | ICD-10-CM | POA: Diagnosis not present

## 2024-01-07 DIAGNOSIS — H2181 Floppy iris syndrome: Secondary | ICD-10-CM | POA: Diagnosis not present

## 2024-01-07 DIAGNOSIS — H25812 Combined forms of age-related cataract, left eye: Secondary | ICD-10-CM | POA: Diagnosis not present

## 2024-01-08 DIAGNOSIS — M6281 Muscle weakness (generalized): Secondary | ICD-10-CM | POA: Diagnosis not present

## 2024-01-09 DIAGNOSIS — N1832 Chronic kidney disease, stage 3b: Secondary | ICD-10-CM | POA: Diagnosis not present

## 2024-01-09 DIAGNOSIS — I5032 Chronic diastolic (congestive) heart failure: Secondary | ICD-10-CM | POA: Diagnosis not present

## 2024-01-09 DIAGNOSIS — M6281 Muscle weakness (generalized): Secondary | ICD-10-CM | POA: Diagnosis not present

## 2024-01-09 DIAGNOSIS — E1122 Type 2 diabetes mellitus with diabetic chronic kidney disease: Secondary | ICD-10-CM | POA: Diagnosis not present

## 2024-01-09 DIAGNOSIS — Z79899 Other long term (current) drug therapy: Secondary | ICD-10-CM | POA: Diagnosis not present

## 2024-01-09 DIAGNOSIS — I129 Hypertensive chronic kidney disease with stage 1 through stage 4 chronic kidney disease, or unspecified chronic kidney disease: Secondary | ICD-10-CM | POA: Diagnosis not present

## 2024-01-13 DIAGNOSIS — M6281 Muscle weakness (generalized): Secondary | ICD-10-CM | POA: Diagnosis not present

## 2024-01-15 DIAGNOSIS — M6281 Muscle weakness (generalized): Secondary | ICD-10-CM | POA: Diagnosis not present

## 2024-01-20 DIAGNOSIS — M6281 Muscle weakness (generalized): Secondary | ICD-10-CM | POA: Diagnosis not present

## 2024-01-21 ENCOUNTER — Other Ambulatory Visit: Payer: Self-pay | Admitting: Nurse Practitioner

## 2024-01-22 DIAGNOSIS — M6281 Muscle weakness (generalized): Secondary | ICD-10-CM | POA: Diagnosis not present

## 2024-01-26 DIAGNOSIS — F5101 Primary insomnia: Secondary | ICD-10-CM | POA: Diagnosis not present

## 2024-01-26 DIAGNOSIS — F319 Bipolar disorder, unspecified: Secondary | ICD-10-CM | POA: Diagnosis not present

## 2024-01-26 DIAGNOSIS — Z87820 Personal history of traumatic brain injury: Secondary | ICD-10-CM | POA: Diagnosis not present

## 2024-01-26 DIAGNOSIS — F064 Anxiety disorder due to known physiological condition: Secondary | ICD-10-CM | POA: Diagnosis not present

## 2024-01-28 DIAGNOSIS — M6281 Muscle weakness (generalized): Secondary | ICD-10-CM | POA: Diagnosis not present

## 2024-01-29 DIAGNOSIS — H2511 Age-related nuclear cataract, right eye: Secondary | ICD-10-CM | POA: Diagnosis not present

## 2024-01-29 DIAGNOSIS — M6281 Muscle weakness (generalized): Secondary | ICD-10-CM | POA: Diagnosis not present

## 2024-01-31 DIAGNOSIS — H25811 Combined forms of age-related cataract, right eye: Secondary | ICD-10-CM | POA: Diagnosis not present

## 2024-02-02 DIAGNOSIS — E1142 Type 2 diabetes mellitus with diabetic polyneuropathy: Secondary | ICD-10-CM | POA: Diagnosis not present

## 2024-02-02 DIAGNOSIS — I10 Essential (primary) hypertension: Secondary | ICD-10-CM | POA: Diagnosis not present

## 2024-02-03 DIAGNOSIS — M6281 Muscle weakness (generalized): Secondary | ICD-10-CM | POA: Diagnosis not present

## 2024-02-04 ENCOUNTER — Emergency Department (HOSPITAL_COMMUNITY)

## 2024-02-04 ENCOUNTER — Other Ambulatory Visit: Payer: Self-pay

## 2024-02-04 ENCOUNTER — Encounter (HOSPITAL_COMMUNITY): Payer: Self-pay

## 2024-02-04 ENCOUNTER — Inpatient Hospital Stay (HOSPITAL_COMMUNITY)
Admission: EM | Admit: 2024-02-04 | Discharge: 2024-02-06 | DRG: 629 | Disposition: A | Discharge status: Skilled Nursing Facility | Attending: Internal Medicine | Admitting: Internal Medicine

## 2024-02-04 DIAGNOSIS — E785 Hyperlipidemia, unspecified: Secondary | ICD-10-CM | POA: Diagnosis present

## 2024-02-04 DIAGNOSIS — I129 Hypertensive chronic kidney disease with stage 1 through stage 4 chronic kidney disease, or unspecified chronic kidney disease: Secondary | ICD-10-CM | POA: Diagnosis not present

## 2024-02-04 DIAGNOSIS — L03116 Cellulitis of left lower limb: Secondary | ICD-10-CM | POA: Diagnosis not present

## 2024-02-04 DIAGNOSIS — E1142 Type 2 diabetes mellitus with diabetic polyneuropathy: Secondary | ICD-10-CM | POA: Diagnosis not present

## 2024-02-04 DIAGNOSIS — M6281 Muscle weakness (generalized): Secondary | ICD-10-CM | POA: Diagnosis not present

## 2024-02-04 DIAGNOSIS — M19072 Primary osteoarthritis, left ankle and foot: Secondary | ICD-10-CM | POA: Diagnosis not present

## 2024-02-04 DIAGNOSIS — S91302A Unspecified open wound, left foot, initial encounter: Secondary | ICD-10-CM | POA: Diagnosis not present

## 2024-02-04 DIAGNOSIS — Z89412 Acquired absence of left great toe: Secondary | ICD-10-CM

## 2024-02-04 DIAGNOSIS — M799 Soft tissue disorder, unspecified: Secondary | ICD-10-CM | POA: Diagnosis not present

## 2024-02-04 DIAGNOSIS — L97529 Non-pressure chronic ulcer of other part of left foot with unspecified severity: Secondary | ICD-10-CM | POA: Diagnosis present

## 2024-02-04 DIAGNOSIS — Z7951 Long term (current) use of inhaled steroids: Secondary | ICD-10-CM

## 2024-02-04 DIAGNOSIS — M89372 Hypertrophy of bone, left ankle and foot: Secondary | ICD-10-CM | POA: Diagnosis not present

## 2024-02-04 DIAGNOSIS — N1832 Chronic kidney disease, stage 3b: Secondary | ICD-10-CM | POA: Diagnosis not present

## 2024-02-04 DIAGNOSIS — I10 Essential (primary) hypertension: Secondary | ICD-10-CM | POA: Diagnosis not present

## 2024-02-04 DIAGNOSIS — S93125A Dislocation of metatarsophalangeal joint of left lesser toe(s), initial encounter: Secondary | ICD-10-CM | POA: Diagnosis present

## 2024-02-04 DIAGNOSIS — E1122 Type 2 diabetes mellitus with diabetic chronic kidney disease: Secondary | ICD-10-CM | POA: Diagnosis present

## 2024-02-04 DIAGNOSIS — L089 Local infection of the skin and subcutaneous tissue, unspecified: Principal | ICD-10-CM | POA: Diagnosis present

## 2024-02-04 DIAGNOSIS — E11628 Type 2 diabetes mellitus with other skin complications: Principal | ICD-10-CM | POA: Diagnosis present

## 2024-02-04 DIAGNOSIS — R600 Localized edema: Secondary | ICD-10-CM | POA: Diagnosis not present

## 2024-02-04 DIAGNOSIS — L97522 Non-pressure chronic ulcer of other part of left foot with fat layer exposed: Secondary | ICD-10-CM | POA: Diagnosis not present

## 2024-02-04 DIAGNOSIS — Z79899 Other long term (current) drug therapy: Secondary | ICD-10-CM | POA: Diagnosis not present

## 2024-02-04 DIAGNOSIS — F3160 Bipolar disorder, current episode mixed, unspecified: Secondary | ICD-10-CM | POA: Diagnosis not present

## 2024-02-04 DIAGNOSIS — E1151 Type 2 diabetes mellitus with diabetic peripheral angiopathy without gangrene: Secondary | ICD-10-CM | POA: Diagnosis not present

## 2024-02-04 DIAGNOSIS — M79675 Pain in left toe(s): Secondary | ICD-10-CM | POA: Diagnosis not present

## 2024-02-04 DIAGNOSIS — J449 Chronic obstructive pulmonary disease, unspecified: Secondary | ICD-10-CM | POA: Diagnosis not present

## 2024-02-04 DIAGNOSIS — N400 Enlarged prostate without lower urinary tract symptoms: Secondary | ICD-10-CM | POA: Diagnosis present

## 2024-02-04 DIAGNOSIS — Z794 Long term (current) use of insulin: Secondary | ICD-10-CM

## 2024-02-04 DIAGNOSIS — E11621 Type 2 diabetes mellitus with foot ulcer: Secondary | ICD-10-CM | POA: Diagnosis present

## 2024-02-04 DIAGNOSIS — L97526 Non-pressure chronic ulcer of other part of left foot with bone involvement without evidence of necrosis: Secondary | ICD-10-CM | POA: Diagnosis not present

## 2024-02-04 DIAGNOSIS — M7989 Other specified soft tissue disorders: Secondary | ICD-10-CM | POA: Diagnosis not present

## 2024-02-04 DIAGNOSIS — M79672 Pain in left foot: Secondary | ICD-10-CM | POA: Diagnosis not present

## 2024-02-04 DIAGNOSIS — E1140 Type 2 diabetes mellitus with diabetic neuropathy, unspecified: Secondary | ICD-10-CM | POA: Diagnosis not present

## 2024-02-04 DIAGNOSIS — L89892 Pressure ulcer of other site, stage 2: Secondary | ICD-10-CM | POA: Diagnosis not present

## 2024-02-04 DIAGNOSIS — E119 Type 2 diabetes mellitus without complications: Secondary | ICD-10-CM

## 2024-02-04 LAB — COMPREHENSIVE METABOLIC PANEL WITH GFR
ALT: 37 U/L (ref 0–44)
AST: 28 U/L (ref 15–41)
Albumin: 3.6 g/dL (ref 3.5–5.0)
Alkaline Phosphatase: 96 U/L (ref 38–126)
Anion gap: 11 (ref 5–15)
BUN: 36 mg/dL — ABNORMAL HIGH (ref 8–23)
CO2: 22 mmol/L (ref 22–32)
Calcium: 8.9 mg/dL (ref 8.9–10.3)
Chloride: 108 mmol/L (ref 98–111)
Creatinine, Ser: 1.96 mg/dL — ABNORMAL HIGH (ref 0.61–1.24)
GFR, Estimated: 38 mL/min — ABNORMAL LOW (ref >60)
Glucose, Bld: 92 mg/dL (ref 70–99)
Potassium: 4.4 mmol/L (ref 3.5–5.1)
Sodium: 142 mmol/L (ref 135–145)
Total Bilirubin: 0.2 mg/dL (ref 0.0–1.2)
Total Protein: 6.9 g/dL (ref 6.5–8.1)

## 2024-02-04 LAB — CBC WITH DIFFERENTIAL/PLATELET
Abs Immature Granulocytes: 0.05 K/uL (ref 0.00–0.07)
Basophils Absolute: 0 K/uL (ref 0.0–0.1)
Basophils Relative: 1 %
Eosinophils Absolute: 0.2 K/uL (ref 0.0–0.5)
Eosinophils Relative: 2 %
HCT: 33.5 % — ABNORMAL LOW (ref 39.0–52.0)
Hemoglobin: 10.8 g/dL — ABNORMAL LOW (ref 13.0–17.0)
Immature Granulocytes: 1 %
Lymphocytes Relative: 38 %
Lymphs Abs: 3 K/uL (ref 0.7–4.0)
MCH: 33.2 pg (ref 26.0–34.0)
MCHC: 32.2 g/dL (ref 30.0–36.0)
MCV: 103.1 fL — ABNORMAL HIGH (ref 80.0–100.0)
Monocytes Absolute: 0.6 K/uL (ref 0.1–1.0)
Monocytes Relative: 8 %
Neutro Abs: 4.1 K/uL (ref 1.7–7.7)
Neutrophils Relative %: 50 %
Platelets: 100 K/uL — ABNORMAL LOW (ref 150–400)
RBC: 3.25 MIL/uL — ABNORMAL LOW (ref 4.22–5.81)
RDW: 14.1 % (ref 11.5–15.5)
WBC: 7.9 K/uL (ref 4.0–10.5)
nRBC: 0 % (ref 0.0–0.2)

## 2024-02-04 LAB — LACTIC ACID, PLASMA: Lactic Acid, Venous: 1.6 mmol/L (ref 0.5–1.9)

## 2024-02-04 MED ORDER — TAMSULOSIN HCL 0.4 MG PO CAPS
0.4000 mg | ORAL_CAPSULE | Freq: Every day | ORAL | Status: DC
  Administered 2024-02-05: 0.4 mg via ORAL
  Filled 2024-02-04: qty 1

## 2024-02-04 MED ORDER — DIVALPROEX SODIUM 250 MG PO DR TAB
1000.0000 mg | DELAYED_RELEASE_TABLET | Freq: Every evening | ORAL | Status: DC
  Administered 2024-02-05: 1000 mg via ORAL
  Filled 2024-02-04: qty 4

## 2024-02-04 MED ORDER — SODIUM CHLORIDE 0.9 % IV SOLN
2.0000 g | Freq: Once | INTRAVENOUS | Status: AC
  Administered 2024-02-04: 2 g via INTRAVENOUS
  Filled 2024-02-04: qty 12.5

## 2024-02-04 MED ORDER — GABAPENTIN 100 MG PO CAPS
100.0000 mg | ORAL_CAPSULE | Freq: Every day | ORAL | Status: DC
  Administered 2024-02-04 – 2024-02-05 (×2): 100 mg via ORAL
  Filled 2024-02-04 (×2): qty 1

## 2024-02-04 MED ORDER — ONDANSETRON HCL 4 MG PO TABS: 4.0000 mg | ORAL_TABLET | Freq: Four times a day (QID) | ORAL | Status: DC | PRN

## 2024-02-04 MED ORDER — SODIUM CHLORIDE 0.9 % IV SOLN
2.0000 g | INTRAVENOUS | Status: DC
  Administered 2024-02-04 – 2024-02-05 (×2): 2 g via INTRAVENOUS
  Filled 2024-02-04 (×2): qty 20

## 2024-02-04 MED ORDER — DIVALPROEX SODIUM 250 MG PO DR TAB
500.0000 mg | DELAYED_RELEASE_TABLET | Freq: Every morning | ORAL | Status: DC
  Administered 2024-02-05 – 2024-02-06 (×2): 500 mg via ORAL
  Filled 2024-02-04 (×2): qty 2

## 2024-02-04 MED ORDER — FINASTERIDE 5 MG PO TABS
5.0000 mg | ORAL_TABLET | Freq: Every day | ORAL | Status: DC
  Administered 2024-02-04 – 2024-02-06 (×3): 5 mg via ORAL
  Filled 2024-02-04 (×3): qty 1

## 2024-02-04 MED ORDER — VANCOMYCIN HCL 2000 MG/400ML IV SOLN
2000.0000 mg | Freq: Once | INTRAVENOUS | Status: AC
  Administered 2024-02-04: 2000 mg via INTRAVENOUS
  Filled 2024-02-04: qty 400

## 2024-02-04 MED ORDER — VANCOMYCIN HCL IN DEXTROSE 1-5 GM/200ML-% IV SOLN: 1000.0000 mg | Freq: Once | INTRAVENOUS | Status: DC

## 2024-02-04 MED ORDER — ACETAMINOPHEN 650 MG RE SUPP: 650.0000 mg | Freq: Four times a day (QID) | RECTAL | Status: DC | PRN

## 2024-02-04 MED ORDER — TRAMADOL HCL 50 MG PO TABS
50.0000 mg | ORAL_TABLET | Freq: Four times a day (QID) | ORAL | Status: DC | PRN
  Administered 2024-02-04 – 2024-02-05 (×2): 50 mg via ORAL
  Filled 2024-02-04 (×2): qty 1

## 2024-02-04 MED ORDER — INSULIN GLARGINE-YFGN 100 UNIT/ML ~~LOC~~ SOLN
30.0000 [IU] | Freq: Every day | SUBCUTANEOUS | Status: DC
  Administered 2024-02-04 – 2024-02-05 (×2): 30 [IU] via SUBCUTANEOUS
  Filled 2024-02-04 (×3): qty 0.3

## 2024-02-04 MED ORDER — INSULIN ASPART 100 UNIT/ML IJ SOLN
0.0000 [IU] | Freq: Four times a day (QID) | INTRAMUSCULAR | Status: DC
  Administered 2024-02-05: 3 [IU] via SUBCUTANEOUS
  Administered 2024-02-05: 2 [IU] via SUBCUTANEOUS
  Administered 2024-02-05: 8 [IU] via SUBCUTANEOUS
  Administered 2024-02-06: 5 [IU] via SUBCUTANEOUS

## 2024-02-04 MED ORDER — ACETAMINOPHEN 325 MG PO TABS
650.0000 mg | ORAL_TABLET | Freq: Four times a day (QID) | ORAL | Status: DC | PRN
Start: 2024-02-04 — End: 2024-02-06

## 2024-02-04 MED ORDER — ONDANSETRON HCL 4 MG/2ML IJ SOLN: 4.0000 mg | Freq: Four times a day (QID) | INTRAMUSCULAR | Status: DC | PRN

## 2024-02-04 MED ORDER — POLYETHYLENE GLYCOL 3350 17 G PO PACK: 17.0000 g | PACK | Freq: Every day | ORAL | Status: DC | PRN

## 2024-02-04 MED ORDER — VANCOMYCIN HCL 1500 MG/300ML IV SOLN
1500.0000 mg | INTRAVENOUS | Status: DC
  Administered 2024-02-05: 1500 mg via INTRAVENOUS
  Filled 2024-02-04: qty 300

## 2024-02-04 MED ORDER — MUPIROCIN 2 % EX OINT
1.0000 | TOPICAL_OINTMENT | Freq: Two times a day (BID) | CUTANEOUS | Status: DC
  Administered 2024-02-04 – 2024-02-06 (×4): 1 via NASAL
  Filled 2024-02-04: qty 22

## 2024-02-04 MED ORDER — INSULIN ASPART 100 UNIT/ML IJ SOLN: 0.0000 [IU] | Freq: Three times a day (TID) | INTRAMUSCULAR | Status: DC

## 2024-02-04 MED ORDER — TRAZODONE HCL 50 MG PO TABS
50.0000 mg | ORAL_TABLET | Freq: Every day | ORAL | Status: DC
  Administered 2024-02-05: 50 mg via ORAL
  Filled 2024-02-04 (×2): qty 1

## 2024-02-04 MED ORDER — RISPERIDONE 1 MG PO TABS
2.0000 mg | ORAL_TABLET | Freq: Every day | ORAL | Status: DC
  Administered 2024-02-04 – 2024-02-06 (×3): 2 mg via ORAL
  Filled 2024-02-04: qty 2
  Filled 2024-02-04: qty 4
  Filled 2024-02-04: qty 2

## 2024-02-04 MED ORDER — DIVALPROEX SODIUM 250 MG PO DR TAB: 500.0000 mg | DELAYED_RELEASE_TABLET | Freq: Two times a day (BID) | ORAL | Status: DC

## 2024-02-04 MED ORDER — GADOBUTROL 1 MMOL/ML IV SOLN
10.0000 mL | Freq: Once | INTRAVENOUS | Status: AC | PRN
  Administered 2024-02-04: 10 mL via INTRAVENOUS

## 2024-02-04 MED ORDER — INSULIN ASPART 100 UNIT/ML IJ SOLN: 0.0000 [IU] | Freq: Every day | INTRAMUSCULAR | Status: DC

## 2024-02-04 MED ORDER — BENZTROPINE MESYLATE 1 MG PO TABS
1.0000 mg | ORAL_TABLET | Freq: Two times a day (BID) | ORAL | Status: DC
  Administered 2024-02-04 – 2024-02-06 (×4): 1 mg via ORAL
  Filled 2024-02-04 (×4): qty 1

## 2024-02-04 MED ORDER — ATORVASTATIN CALCIUM 40 MG PO TABS
40.0000 mg | ORAL_TABLET | Freq: Every day | ORAL | Status: DC
  Administered 2024-02-04 – 2024-02-06 (×3): 40 mg via ORAL
  Filled 2024-02-04 (×3): qty 1

## 2024-02-04 MED ORDER — BASAGLAR KWIKPEN 100 UNIT/ML ~~LOC~~ SOPN: 30.0000 [IU] | PEN_INJECTOR | Freq: Every day | SUBCUTANEOUS | Status: DC

## 2024-02-04 NOTE — ED Provider Notes (Signed)
 [place] EMERGENCY DEPARTMENT AT [facility] Provider Note   CSN: [id number] Arrival date & time: 02/04/24  1152     Patient presents with: diabetic foot ulcer   Scott [name] is a 63 y.o. male.   HPI Patient presents for left foot wound.  Medical history includes DM, HLD, HTN, COPD, BPH, CKD.  In January, he underwent partial amputation of his left foot.  He developed soreness and warmth to the plantar aspect of his left foot 2 weeks ago.  He developed a wound in this area over the past several days.  He was seen by his podiatrist, Dr. Fernande, earlier today antibiotic come to the ED.  Patient denies any pain while at rest.  He does have tenderness when walking on it.  He denies any systemic symptoms.    Prior to Admission medications   Medication Sig Start Date End Date Taking? Authorizing Provider  acetaminophen  (TYLENOL ) 325 MG tablet Take 650 mg by mouth every 6 (six) hours as needed.    [provider]  albuterol  (VENTOLIN  HFA) 108 (90 Base) MCG/ACT inhaler Inhale 2 puffs into the lungs every 6 (six) hours as needed for wheezing or shortness of breath.    [provider]  amLODipine  (NORVASC ) 5 MG tablet Take 1 tablet (5 mg total) by mouth daily. 06/15/19   Antoinette Doe, MD  ascorbic acid  (VITAMIN C ) 500 MG tablet Take 500 mg by mouth daily.    [provider]  atorvastatin  (LIPITOR) 40 MG tablet Take 40 mg by mouth daily. 07/10/22   [provider]  BD AUTOSHIELD DUO 30G X 5 MM MISC USE FOR ONCE DAILY LANTUS  ADMINISTRATION. 04/09/23   Therisa Benton PARAS, NP  benztropine  (COGENTIN ) 1 MG tablet Take 1 mg by mouth 2 (two) times daily.  11/05/18   [provider]  cephALEXin  (KEFLEX ) 500 MG capsule Take 1 capsule (500 mg total) by mouth every 8 (eight) hours. Patient not taking: Reported on 12/24/2023 06/18/23   Evonnie Lenis, MD  cetirizine (ZYRTEC) 10 MG chewable tablet Chew 10 mg by mouth daily.    [provider]   divalproex  (DEPAKOTE ) 500 MG DR tablet Take 500-1,000 mg by mouth 2 (two) times daily. Take 1 tab am and 2 tabs pm    [provider]  doxycycline  (VIBRA -TABS) 100 MG tablet Take 1 tablet (100 mg total) by mouth every 12 (twelve) hours. Patient not taking: Reported on 12/24/2023 06/18/23   Evonnie Lenis, MD  finasteride  (PROSCAR ) 5 MG tablet Take 1 tablet (5 mg total) by mouth daily. 12/16/23   [name], Belvie CROME, MD  furosemide  (LASIX ) 20 MG tablet Take 1 tablet (20 mg total) by mouth daily. 04/02/23   Akula, Vijaya, MD  gabapentin  (NEURONTIN ) 100 MG capsule Take 100 mg by mouth at bedtime.    [provider]  glucose blood (EASYMAX TEST) test strip USE AS DIRECTED TO CHECK BLOOD SUGAR FOUR DAILY. 10/29/23   Therisa Benton PARAS, NP  insulin  aspart (NOVOLOG  FLEXPEN) 100 UNIT/ML FlexPen INJECT 5 UNITS SUBCUTANEOUSLY THREE TIMES DAILY W/ MEALS IF BGIS 90-150 & EATING. HOLD INSULIN  IF BG <90, ADD SSI IF >400. ADD SLIDING SCALE FOR BG: 151-200=1U; 201-250=2U; 251-300=3U; 301-350=4U; 351-400=5U; >400=6U & CALL MD. 01/23/24   Therisa Benton PARAS, NP  Insulin  Glargine (BASAGLAR  KWIKPEN) 100 UNIT/ML Inject 60 Units into the skin at bedtime. 08/21/23   Therisa Benton PARAS, NP  Insulin  Pen Needle (DROPSAFE SAFETY PEN NEEDLES) 31G X 6 MM MISC Use  to inject insulin  4 times daily 08/21/23   Therisa Benton PARAS, NP  Insulin  Pen Needle 31G X 5 MM MISC Use with insulin  pen to dispense insulin  as directed 06/18/23   Tat, Alm, MD  ipratropium-albuterol  (DUONEB) 0.5-2.5 (3) MG/3ML SOLN Take 3 mLs by nebulization every 4 (four) hours as needed (wheezing, shortness of breath). 04/02/23   Cherlyn Labella, MD  Lancets MISC 1 each by Does not apply route 4 (four) times daily. 10/29/23   Therisa Benton PARAS, NP  lithium  carbonate 300 MG capsule Take 300 mg by mouth in the morning and at bedtime.    [provider]  loratadine  (CLARITIN ) 10 MG tablet Take 10 mg by mouth daily.    [provider]   Melatonin 3 MG SUBL Place 3 mg under the tongue at bedtime.    [provider]  metoprolol  tartrate (LOPRESSOR ) 25 MG tablet Take 0.5 tablets (12.5 mg total) by mouth 2 (two) times daily. Patient taking differently: Take 25 mg by mouth 2 (two) times daily. 05/03/15   [name], Jolanta B, MD  Multiple Vitamin (DAILY-VITE) TABS Take 1 tablet by mouth daily. 03/04/23   [provider]  mupirocin  ointment (BACTROBAN ) 2 % Apply 1 Application topically 2 (two) times daily as needed.    [provider]  nitroGLYCERIN (NITRODUR - DOSED IN MG/24 HR) 0.2 mg/hr patch APPLY (1) PATCH TO DORSAL LEFT FOOT EVERY DAY. REMOVE OLD PATCH BEFORE APPLYING NEW PATCH. 06/19/23   Zamora, Erin R, NP  nystatin powder Apply 1 Application topically 3 (three) times daily. Apply to groin area three times daily    [provider]  oseltamivir  (TAMIFLU ) 30 MG capsule Take 1 capsule (30 mg total) by mouth 2 (two) times daily. Patient not taking: Reported on 12/24/2023 06/18/23   Evonnie Alm, MD  risperiDONE  (RISPERDAL ) 2 MG tablet Take 1 tablet (2 mg total) by mouth daily. 02/15/23   Johnson, Clanford L, MD  silodosin  (RAPAFLO ) 8 MG CAPS capsule Take 1 capsule (8 mg total) by mouth at bedtime. 12/16/23   [name], Belvie CROME, MD  silver sulfADIAZINE (SILVADENE) 1 % cream Apply 1 Application topically daily.    [provider]  SPIRIVA  RESPIMAT 2.5 MCG/ACT AERS Inhale 2 puffs into the lungs daily. 03/04/23   [provider]  tamsulosin  (FLOMAX ) 0.4 MG CAPS capsule TAKE (1) CAPSULE BY MOUTH TWICE DAILY. Patient not taking: Reported on 12/24/2023 12/06/23   Sherrilee Belvie CROME, MD  traZODone  (DESYREL ) 50 MG tablet Take 50 mg by mouth at bedtime.    [provider]  Zinc  Sulfate 220 (50 Zn) MG TABS Take 1 tablet by mouth daily. 03/04/23   [provider]    Allergies: Patient has no known allergies.    Review of Systems  Skin:  Positive for wound.  All other systems  reviewed and are negative.   Updated Vital Signs BP (!) 148/73   Pulse 77   Temp 97.7 F (36.5 C) (Oral)   Resp 18   Ht 5' 9 (1.753 m)   Wt 99.8 kg   SpO2 99%   BMI 32.49 kg/m   Physical Exam Vitals and nursing note reviewed.  Constitutional:      General: He is not in acute distress.    Appearance: Normal appearance. He is well-developed. He is not ill-appearing, toxic-appearing or diaphoretic.  HENT:     Head: Normocephalic and atraumatic.     Right Ear: External ear normal.     Left Ear:  External ear normal.     Nose: Nose normal.     Mouth/Throat:     Mouth: Mucous membranes are moist.  Eyes:     Extraocular Movements: Extraocular movements intact.     Conjunctiva/sclera: Conjunctivae normal.  Cardiovascular:     Rate and Rhythm: Normal rate and regular rhythm.  Pulmonary:     Effort: Pulmonary effort is normal. No respiratory distress.  Abdominal:     General: There is no distension.     Palpations: Abdomen is soft.  Musculoskeletal:        General: No swelling. Normal range of motion.     Cervical back: Normal range of motion and neck supple.     Comments: Great toe amputation present.  Wound is present on the distal aspect of left foot plantar surface in addition to third into webspace.  There is area of mild fluctuance.  No drainage at this time.  Skin:    General: Skin is warm and dry.     Coloration: Skin is not jaundiced or pale.  Neurological:     General: No focal deficit present.     Mental Status: He is alert and oriented to person, place, and time.  Psychiatric:        Mood and Affect: Mood normal.        Behavior: Behavior normal.     (all labs ordered are listed, but only abnormal results are displayed) Labs Reviewed  COMPREHENSIVE METABOLIC PANEL WITH GFR - Abnormal; Notable for the following components:      Result Value   BUN 36 (*)    Creatinine, Ser 1.96 (*)    GFR, Estimated 38 (*)    All other components within normal limits  CBC  WITH DIFFERENTIAL/PLATELET - Abnormal; Notable for the following components:   RBC 3.25 (*)    Hemoglobin 10.8 (*)    HCT 33.5 (*)    MCV 103.1 (*)    Platelets 100 (*)    All other components within normal limits  CULTURE, BLOOD (ROUTINE X 2)  CULTURE, BLOOD (ROUTINE X 2)  LACTIC ACID, PLASMA    EKG: None  Radiology: DG Foot 2 Views Left Result Date: 02/04/2024 EXAM: 2 VIEW(S) XRAY OF THE LEFT FOOT 02/04/2024 01:09:00 PM COMPARISON: 06/15/2023 CLINICAL HISTORY: Left foot wound. Pt arrived via POV from the foot doctor for further evaluation of left diabetic foot ulcer between his 3rd and 4th toes. Pt reports he is a resident at [facility], and they do have a wound care nurse but his wound is not healing. Hx of diabetes. FINDINGS: BONES AND JOINTS: Status post amputation of the first ray as noted on prior study. Probable fusion of the talus and calcaneus. Probable lytic destruction of the distal portion of the second metatarsal of indeterminate age. No acute fracture. No joint dislocation. SOFT TISSUES: The soft tissues are unremarkable. IMPRESSION: 1. Probable lytic destruction of the distal portion of the second metatarsal, indeterminate age, concerning for osteomyelitis . 2. Status post first ray amputation. Electronically signed by: Lynwood Seip MD 02/04/2024 01:37 PM EDT RP Workstation: [id number]     Procedures   Medications Ordered in the ED  vancomycin  (VANCOREADY) IVPB 2000 mg/400 mL (2,000 mg Intravenous New Bag/Given 02/04/24 1400)  ceFEPIme  (MAXIPIME ) 2 g in sodium chloride  0.9 % 100 mL IVPB (2 g Intravenous New Bag/Given 02/04/24 1357)  gadobutrol  (GADAVIST ) 1 MMOL/ML injection 10 mL (10 mLs Intravenous Contrast Given 02/04/24 1446)  Medical Decision Making Amount and/or Complexity of Data Reviewed Labs: ordered. Radiology: ordered.  Risk Prescription drug management.   This patient presents to the ED for concern of left foot  wound, this involves an extensive number of treatment options, and is a complaint that carries with it a high risk of complications and morbidity.  The differential diagnosis includes cellulitis, abscess, osteomyelitis   Co morbidities / Chronic conditions that complicate the patient evaluation  DM, HLD, HTN, COPD, BPH, CKD   Additional history obtained:  Additional history obtained from EMR External records from outside source obtained and reviewed including N/A   Lab Tests:  I Ordered, and personally interpreted labs.  The pertinent results include: Baseline creatinine, normal electrolytes, normal hemoglobin, no leukocytosis   Imaging Studies ordered:  I ordered imaging studies including left foot x-ray, left foot MRI, bilateral ABI I independently visualized and interpreted imaging which showed x-ray findings concerning for osteomyelitis.  Remaining imaging pending at time of admission I agree with the radiologist interpretation   Cardiac Monitoring: / EKG:  The patient was maintained on a cardiac monitor.  I personally viewed and interpreted the cardiac monitored which showed an underlying rhythm of: Sinus rhythm   Problem List / ED Course / Critical interventions / Medication management  Patient presents for left foot wound.  He does have a history of diabetes and did undergo a great toe amputation in January.  He developed inflammation to the plantar aspect of his left forefoot 2 weeks ago.  This has developed into wounds on forefoot as well as and third into webspace.  He was seen by his podiatrist, Dr. Fernande, earlier today and was advised to come to the ED.  On arrival in the ED, he is overall well-appearing.  Wound shows erythema, tenderness, and mild fluctuance.  X-ray imaging shows concern for osteomyelitis.  Antibiotics were initiated.  Patient's lab work is unremarkable.  I spoke with podiatrist on-call, Dr. Blinda, who will speak with his colleague, Dr. Tobie, who  will see in consult tomorrow.  Dr. Blinda did request ABIs.  He requested segmental ABI, however, this is unavailable here at [facility].  Screening ABI is available.  Results of this and MRI were pending at time of admission.  Patient was admitted for further management. I ordered medication including vancomycin  and cefepime  for foot wound and likely osteomyelitis Reevaluation of the patient after these medicines showed that the patient stayed the same I have reviewed the patients home medicines and have made adjustments as needed   Consultations Obtained:  I requested consultation with the podiatrist, Dr. Blinda,  and discussed lab and imaging findings as well as pertinent plan - they recommend: Admission to [facility], ABIs, MRI   Social Determinants of Health:  Has access to outpatient care     Final diagnoses:  Wound infection    ED Discharge Orders     None          Melvenia Motto, MD 02/04/24 1454

## 2024-02-04 NOTE — ED Notes (Signed)
 Patient transported to MRI

## 2024-02-04 NOTE — ED Notes (Signed)
 Dietary called.  Will bring patient a tray when available

## 2024-02-04 NOTE — ED Notes (Signed)
Pt ambulated to the bathroom to void.   

## 2024-02-04 NOTE — Progress Notes (Signed)
 Pharmacy Antibiotic Note  Scott [name] is a 63 y.o. male admitted on 02/04/2024 with diabetic foot infection/ulcer.  Pharmacy has been consulted for Vancomycin  dosing.  -also on Ceftriaxone   Plan: Vancomycin  2000 mg IV Loading dose given Will continue with Vancomycin  1500 mg IV Q 24 hrs. Goal AUC 400-550. Expected AUC: 501 Cmin 12.9 SCr used: 1.96  Continue to assess renal fxn, cultures, length of therapy, etc     Height: 6' 2 (188 cm) Weight: 98.6 kg (217 lb 6 oz) IBW/kg (Calculated) : 82.2  Temp (24hrs), Avg:97.7 F (36.5 C), Min:97.4 F (36.3 C), Max:97.9 F (36.6 C)  Recent Labs  Lab 02/04/24 1353  WBC 7.9  CREATININE 1.96*  LATICACIDVEN 1.6    Estimated Creatinine Clearance: 45.4 mL/min (A) (by C-G formula based on SCr of 1.96 mg/dL (H)).    No Known Allergies  Antimicrobials this admission: Cefepime  10/28 x1 ceftriaxone  10/28 ?>>   vancomycin  10/28 >>    Dose adjustments this admission:    Microbiology results: 10/28 BCx: pending   UCx:      Sputum:      MRSA PCR:    Thank you for allowing pharmacy to be a part of this patient's care.  Allean Haas PharmD Clinical Pharmacist 02/04/2024

## 2024-02-04 NOTE — ED Notes (Addendum)
 Picture of left foot wound added to chart.

## 2024-02-04 NOTE — Plan of Care (Signed)
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
## 2024-02-04 NOTE — ED Triage Notes (Addendum)
 Pt arrived via POV from the foot doctor for further evaluation of left diabetic foot ulcer between his 3rd and 4th toes. Pt reports he is a resident at [facility], and they do have a wound care nurse but his wound is not healing.
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
## 2024-02-05 ENCOUNTER — Encounter (HOSPITAL_COMMUNITY): Payer: Self-pay | Admitting: Internal Medicine

## 2024-02-05 ENCOUNTER — Encounter (HOSPITAL_COMMUNITY)
Admission: EM | Disposition: A | Discharge status: Skilled Nursing Facility | Payer: Self-pay | Source: Home / Self Care | Attending: Internal Medicine

## 2024-02-05 ENCOUNTER — Inpatient Hospital Stay (HOSPITAL_COMMUNITY): Admitting: Certified Registered"

## 2024-02-05 DIAGNOSIS — M79675 Pain in left toe(s): Secondary | ICD-10-CM | POA: Diagnosis not present

## 2024-02-05 DIAGNOSIS — E11621 Type 2 diabetes mellitus with foot ulcer: Secondary | ICD-10-CM | POA: Diagnosis not present

## 2024-02-05 DIAGNOSIS — J449 Chronic obstructive pulmonary disease, unspecified: Secondary | ICD-10-CM | POA: Diagnosis not present

## 2024-02-05 DIAGNOSIS — N1832 Chronic kidney disease, stage 3b: Secondary | ICD-10-CM

## 2024-02-05 DIAGNOSIS — M89372 Hypertrophy of bone, left ankle and foot: Secondary | ICD-10-CM | POA: Diagnosis not present

## 2024-02-05 DIAGNOSIS — E11628 Type 2 diabetes mellitus with other skin complications: Secondary | ICD-10-CM | POA: Diagnosis not present

## 2024-02-05 DIAGNOSIS — I129 Hypertensive chronic kidney disease with stage 1 through stage 4 chronic kidney disease, or unspecified chronic kidney disease: Secondary | ICD-10-CM

## 2024-02-05 DIAGNOSIS — L97526 Non-pressure chronic ulcer of other part of left foot with bone involvement without evidence of necrosis: Secondary | ICD-10-CM

## 2024-02-05 DIAGNOSIS — L97522 Non-pressure chronic ulcer of other part of left foot with fat layer exposed: Secondary | ICD-10-CM | POA: Diagnosis not present

## 2024-02-05 DIAGNOSIS — L089 Local infection of the skin and subcutaneous tissue, unspecified: Secondary | ICD-10-CM | POA: Diagnosis not present

## 2024-02-05 DIAGNOSIS — L97529 Non-pressure chronic ulcer of other part of left foot with unspecified severity: Secondary | ICD-10-CM | POA: Diagnosis not present

## 2024-02-05 HISTORY — PX: METATARSAL HEAD EXCISION: SHX5027

## 2024-02-05 LAB — CBC
HCT: 35.9 % — ABNORMAL LOW (ref 39.0–52.0)
Hemoglobin: 11.5 g/dL — ABNORMAL LOW (ref 13.0–17.0)
MCH: 32.3 pg (ref 26.0–34.0)
MCHC: 32 g/dL (ref 30.0–36.0)
MCV: 100.8 fL — ABNORMAL HIGH (ref 80.0–100.0)
Platelets: 165 K/uL (ref 150–400)
RBC: 3.56 MIL/uL — ABNORMAL LOW (ref 4.22–5.81)
RDW: 14 % (ref 11.5–15.5)
WBC: 9 K/uL (ref 4.0–10.5)
nRBC: 0 % (ref 0.0–0.2)

## 2024-02-05 LAB — GLUCOSE, CAPILLARY
Glucose-Capillary: 144 mg/dL — ABNORMAL HIGH (ref 70–99)
Glucose-Capillary: 154 mg/dL — ABNORMAL HIGH (ref 70–99)
Glucose-Capillary: 192 mg/dL — ABNORMAL HIGH (ref 70–99)
Glucose-Capillary: 257 mg/dL — ABNORMAL HIGH (ref 70–99)
Glucose-Capillary: 80 mg/dL (ref 70–99)
Glucose-Capillary: 84 mg/dL (ref 70–99)

## 2024-02-05 LAB — BASIC METABOLIC PANEL WITH GFR
Anion gap: 9 (ref 5–15)
BUN: 32 mg/dL — ABNORMAL HIGH (ref 8–23)
CO2: 23 mmol/L (ref 22–32)
Calcium: 8.5 mg/dL — ABNORMAL LOW (ref 8.9–10.3)
Chloride: 112 mmol/L — ABNORMAL HIGH (ref 98–111)
Creatinine, Ser: 1.92 mg/dL — ABNORMAL HIGH (ref 0.61–1.24)
GFR, Estimated: 39 mL/min — ABNORMAL LOW (ref >60)
Glucose, Bld: 79 mg/dL (ref 70–99)
Potassium: 4.4 mmol/L (ref 3.5–5.1)
Sodium: 144 mmol/L (ref 135–145)

## 2024-02-05 LAB — HEMOGLOBIN A1C
Hgb A1c MFr Bld: 7.5 % — ABNORMAL HIGH (ref 4.8–5.6)
Mean Plasma Glucose: 168.55 mg/dL

## 2024-02-05 LAB — SURGICAL PCR SCREEN
MRSA, PCR: NEGATIVE
Staphylococcus aureus: NEGATIVE

## 2024-02-05 SURGERY — EXCISION, METATARSAL BONE, HEAD
Anesthesia: General | Site: Toe | Laterality: Left

## 2024-02-05 MED ORDER — CHLORHEXIDINE GLUCONATE 0.12 % MT SOLN: 15.0000 mL | Freq: Once | OROMUCOSAL | Status: DC

## 2024-02-05 MED ORDER — BUPIVACAINE HCL (PF) 0.5 % IJ SOLN
INTRAMUSCULAR | Status: AC
  Filled 2024-02-05: qty 30

## 2024-02-05 MED ORDER — LIDOCAINE HCL 1 % IJ SOLN
INTRAMUSCULAR | Status: DC | PRN
  Administered 2024-02-05: 10 mL via INTRAMUSCULAR

## 2024-02-05 MED ORDER — OXYCODONE HCL 5 MG PO TABS
5.0000 mg | ORAL_TABLET | Freq: Four times a day (QID) | ORAL | Status: DC | PRN
  Administered 2024-02-05 (×2): 5 mg via ORAL
  Filled 2024-02-05 (×2): qty 1

## 2024-02-05 MED ORDER — LACTATED RINGERS IV SOLN: INTRAVENOUS | Status: DC

## 2024-02-05 MED ORDER — ORAL CARE MOUTH RINSE: 15.0000 mL | Freq: Once | OROMUCOSAL | Status: DC

## 2024-02-05 MED ORDER — PROPOFOL 500 MG/50ML IV EMUL
INTRAVENOUS | Status: DC | PRN
  Administered 2024-02-05: 75 ug/kg/min via INTRAVENOUS
  Administered 2024-02-05: 40 mg via INTRAVENOUS
  Administered 2024-02-05: 20 mg via INTRAVENOUS

## 2024-02-05 MED ORDER — LIDOCAINE 2% (20 MG/ML) 5 ML SYRINGE
INTRAMUSCULAR | Status: DC | PRN
  Administered 2024-02-05: 40 mg via INTRAVENOUS

## 2024-02-05 MED ORDER — TRAMADOL HCL 50 MG PO TABS
50.0000 mg | ORAL_TABLET | Freq: Four times a day (QID) | ORAL | Status: DC | PRN
  Administered 2024-02-05 – 2024-02-06 (×2): 50 mg via ORAL
  Filled 2024-02-05 (×2): qty 1

## 2024-02-05 MED ORDER — 0.9 % SODIUM CHLORIDE (POUR BTL) OPTIME
TOPICAL | Status: DC | PRN
  Administered 2024-02-05: 1000 mL

## 2024-02-05 MED ORDER — LIDOCAINE HCL (PF) 1 % IJ SOLN
INTRAMUSCULAR | Status: AC
  Filled 2024-02-05: qty 30

## 2024-02-05 MED ORDER — LIDOCAINE 2% (20 MG/ML) 5 ML SYRINGE
INTRAMUSCULAR | Status: AC
  Filled 2024-02-05: qty 5

## 2024-02-05 MED ORDER — PHENYLEPHRINE HCL-NACL 20-0.9 MG/250ML-% IV SOLN
INTRAVENOUS | Status: AC
  Filled 2024-02-05: qty 250

## 2024-02-05 SURGICAL SUPPLY — 37 items
BANDAGE ESMARK 4X12 BL STRL LF (DISPOSABLE) ×1 IMPLANT
BENZOIN TINCTURE PRP APPL 2/3 (GAUZE/BANDAGES/DRESSINGS) ×1 IMPLANT
BLADE AVERAGE 25X9 (BLADE) ×1 IMPLANT
BLADE SURG 15 STRL LF DISP TIS (BLADE) ×1 IMPLANT
BNDG ELASTIC 4X5.8 VLCR NS LF (GAUZE/BANDAGES/DRESSINGS) ×1 IMPLANT
BNDG GAUZE DERMACEA FLUFF 4 (GAUZE/BANDAGES/DRESSINGS) ×1 IMPLANT
CLOTH BEACON ORANGE TIMEOUT ST (SAFETY) ×1 IMPLANT
COVER LIGHT HANDLE STERIS (MISCELLANEOUS) ×2 IMPLANT
CUFF TOURN SGL QUICK 18X4 (TOURNIQUET CUFF) ×1 IMPLANT
DRSG ADAPTIC 3X8 NADH LF (GAUZE/BANDAGES/DRESSINGS) ×1 IMPLANT
ELECTRODE REM PT RTRN 9FT ADLT (ELECTROSURGICAL) ×1 IMPLANT
GAUZE 4X4 16PLY ~~LOC~~+RFID DBL (SPONGE) ×1 IMPLANT
GAUZE SPONGE 4X4 12PLY STRL (GAUZE/BANDAGES/DRESSINGS) ×1 IMPLANT
GAUZE STRETCH 2X75IN STRL (MISCELLANEOUS) ×1 IMPLANT
GLOVE BIO SURGEON STRL SZ7.5 (GLOVE) ×1 IMPLANT
GLOVE BIOGEL PI IND STRL 7.0 (GLOVE) ×2 IMPLANT
GLOVE BIOGEL PI IND STRL 7.5 (GLOVE) ×1 IMPLANT
GLOVE ECLIPSE 7.0 STRL STRAW (GLOVE) ×1 IMPLANT
GOWN STRL REUS W/ TWL LRG LVL3 (GOWN DISPOSABLE) ×1 IMPLANT
GOWN STRL REUS W/TWL LRG LVL3 (GOWN DISPOSABLE) ×2 IMPLANT
KIT TURNOVER KIT A (KITS) ×1 IMPLANT
MANIFOLD NEPTUNE II (INSTRUMENTS) ×1 IMPLANT
NDL HYPO 25X1 1.5 SAFETY (NEEDLE) ×2 IMPLANT
NEEDLE HYPO 25X1 1.5 SAFETY (NEEDLE) ×2 IMPLANT
PACK BASIC LIMB (CUSTOM PROCEDURE TRAY) ×1 IMPLANT
PAD ABD 5X9 TENDERSORB (GAUZE/BANDAGES/DRESSINGS) IMPLANT
PAD ARMBOARD POSITIONER FOAM (MISCELLANEOUS) ×1 IMPLANT
POSITIONER HEAD 8X9X4 ADT (SOFTGOODS) ×1 IMPLANT
SET BASIN LINEN APH (SET/KITS/TRAYS/PACK) ×1 IMPLANT
SOLN 0.9% NACL POUR BTL 1000ML (IV SOLUTION) ×1 IMPLANT
SPONGE T-LAP 18X18 ~~LOC~~+RFID (SPONGE) ×1 IMPLANT
STRIP CLOSURE SKIN 1/2X4 (GAUZE/BANDAGES/DRESSINGS) ×1 IMPLANT
SUT PROLENE 4 0 PS 2 18 (SUTURE) IMPLANT
SUT VIC AB 4-0 PS2 27 (SUTURE) ×1 IMPLANT
SUT VICRYL AB 3-0 FS1 BRD 27IN (SUTURE) IMPLANT
SWAB CULTURE ESWAB REG 1ML (MISCELLANEOUS) IMPLANT
SYR CONTROL 10ML LL (SYRINGE) ×2 IMPLANT

## 2024-02-05 NOTE — Op Note (Signed)
 02/05/2024  11:30 AM  PATIENT:  Scott [name]  63 y.o. male  PRE-OPERATIVE DIAGNOSIS:  Chronic Ulcer Left foot  POST-OPERATIVE DIAGNOSIS:  Chronic Ulcer Left foot  PROCEDURE:  Procedure(s): EXCISION, METATARSAL BONE, HEAD (Left) Debridement of the the left foot ulcer.   SURGEON:  Surgeons and Role:    * Tobie Buckles, DPM - Primary  PHYSICIAN ASSISTANT:   ASSISTANTS: none   ANESTHESIA:   local and MAC  EBL:  None  BLOOD ADMINISTERED:none  DRAINS: none   LOCAL MEDICATIONS USED:  MARCAINE   , LIDOCAINE  , and Amount: 10 ml  SPECIMEN:  Source of Specimen:  left foot third metatarsal head  DISPOSITION OF SPECIMEN:  PATHOLOGY  MATERIALS: 3-0 Vicryl, 4-0 Prolene  TOURNIQUET:   Total Tourniquet Time Documented: Ankle (Left) - 28 minutes Total: Ankle (Left) - 28 minutes  PLAN OF CARE: Admit to inpatient   PATIENT DISPOSITION:  PACU - hemodynamically stable.  Patient was brought into the operating room laid supine on the operating table. Ankle tourniquet was applied to the surgical extremity. Following IV sedation, a local block was achieved using 10 cc of mixture of 1% plain lidocaine  with 0.5% marcaine. The foot was the prepped, scrubbed and draped in aseptic manner. Using an esmarch band the tourniquet on the surgical site was inflatted at .    Attention was directed towards the left third met head. Full thickness ulceration noted on the sub 3rd met head with noviable tissue. Periwound erythema and swelling noted. No exposed bone noted. Tendon visible. Prominent sub 3rd met head felt.   A dorsal linear incision was made usin #15 blade down to skin and subcutaneous tissue. Neurovascular bundles were retracted. Extensor tendon was identified and retracted. At this time the capsule over the third metatarsal head was incised to view the metatarsal. Freer elevator was used to reflect the remaining capsule and exposed the third metatarsal head. At this time using  sagittal saw the transverse osteotomy was made at the metatarsal neck and the head was removed. Some erosive changes noted to the cartilage plantarly. The wound was irrigated. No purulent drainage was noted. Wound cultures were taken. Capsule was closed using 3-0 Vicryl, Subcutenous tissue closure was done using 3-0 Vicryl. The skin was closed using 4-0 Prolene.   The ulcer on the sub 3rd met head was debrided using #15 blade down to skin and subcutenous tissue. Periwound hyperkertosis was debrided. The wound was irrigated. Applied betadine  adaptic and DSD on the left foot.   The tourniquet was deflated. Capillary refill time was brisk to lesser toes.  Patient was transferred to PACU with VSS. Will be transferred back to floor.

## 2024-02-05 NOTE — Plan of Care (Signed)
 Problem: Education: Goal: Knowledge of General Education information will improve Description: Including pain rating scale, medication(s)/side effects and non-pharmacologic comfort measures 02/05/2024 0753 by Mathews Norleen POUR, RN Outcome: Progressing 02/04/2024 1838 by Mathews Norleen POUR, RN Outcome: Progressing   Problem: Health Behavior/Discharge Planning: Goal: Ability to manage health-related needs will improve 02/05/2024 0753 by Mathews Norleen POUR, RN Outcome: Progressing 02/04/2024 1838 by Mathews Norleen POUR, RN Outcome: Progressing   Problem: Clinical Measurements: Goal: Ability to maintain clinical measurements within normal limits will improve 02/05/2024 0753 by Mathews Norleen POUR, RN Outcome: Progressing 02/04/2024 1838 by Mathews Norleen POUR, RN Outcome: Progressing Goal: Will remain free from infection 02/05/2024 0753 by Mathews Norleen POUR, RN Outcome: Progressing 02/04/2024 1838 by Mathews Norleen POUR, RN Outcome: Progressing Goal: Diagnostic test results will improve 02/05/2024 0753 by Mathews Norleen POUR, RN Outcome: Progressing 02/04/2024 1838 by Mathews Norleen POUR, RN Outcome: Progressing Goal: Respiratory complications will improve 02/05/2024 0753 by Mathews Norleen POUR, RN Outcome: Progressing 02/04/2024 1838 by Mathews Norleen POUR, RN Outcome: Progressing Goal: Cardiovascular complication will be avoided 02/05/2024 0753 by Mathews Norleen POUR, RN Outcome: Progressing 02/04/2024 1838 by Mathews Norleen POUR, RN Outcome: Progressing   Problem: Activity: Goal: Risk for activity intolerance will decrease 02/05/2024 0753 by Mathews Norleen POUR, RN Outcome: Progressing 02/04/2024 1838 by Mathews Norleen POUR, RN Outcome: Progressing   Problem: Nutrition: Goal: Adequate nutrition will be maintained 02/05/2024 0753 by Mathews Norleen POUR, RN Outcome: Progressing 02/04/2024 1838 by Mathews Norleen POUR, RN Outcome: Progressing   Problem: Coping: Goal: Level of anxiety will decrease 02/05/2024 0753 by Mathews Norleen POUR, RN Outcome:  Progressing 02/04/2024 1838 by Mathews Norleen POUR, RN Outcome: Progressing   Problem: Elimination: Goal: Will not experience complications related to bowel motility 02/05/2024 0753 by Mathews Norleen POUR, RN Outcome: Progressing 02/04/2024 1838 by Mathews Norleen POUR, RN Outcome: Progressing Goal: Will not experience complications related to urinary retention 02/05/2024 0753 by Mathews Norleen POUR, RN Outcome: Progressing 02/04/2024 1838 by Mathews Norleen POUR, RN Outcome: Progressing   Problem: Pain Managment: Goal: General experience of comfort will improve and/or be controlled 02/05/2024 0753 by Mathews Norleen POUR, RN Outcome: Progressing 02/04/2024 1838 by Mathews Norleen POUR, RN Outcome: Progressing   Problem: Safety: Goal: Ability to remain free from injury will improve 02/05/2024 0753 by Mathews Norleen POUR, RN Outcome: Progressing 02/04/2024 1838 by Mathews Norleen POUR, RN Outcome: Progressing   Problem: Skin Integrity: Goal: Risk for impaired skin integrity will decrease 02/05/2024 0753 by Mathews Norleen POUR, RN Outcome: Progressing 02/04/2024 1838 by Mathews Norleen POUR, RN Outcome: Progressing   Problem: Education: Goal: Ability to describe self-care measures that may prevent or decrease complications (Diabetes Survival Skills Education) will improve Outcome: Progressing Goal: Individualized Educational Video(s) Outcome: Progressing   Problem: Coping: Goal: Ability to adjust to condition or change in health will improve Outcome: Progressing   Problem: Fluid Volume: Goal: Ability to maintain a balanced intake and output will improve Outcome: Progressing   Problem: Health Behavior/Discharge Planning: Goal: Ability to identify and utilize available resources and services will improve Outcome: Progressing Goal: Ability to manage health-related needs will improve Outcome: Progressing   Problem: Metabolic: Goal: Ability to maintain appropriate glucose levels will improve Outcome: Progressing   Problem:  Nutritional: Goal: Maintenance of adequate nutrition will improve Outcome: Progressing Goal: Progress toward achieving an optimal weight will improve Outcome: Progressing   Problem: Skin Integrity: Goal: Risk for impaired skin integrity will decrease Outcome: Progressing   Problem: Tissue Perfusion: Goal: Adequacy of tissue perfusion  will improve Outcome: Progressing

## 2024-02-05 NOTE — Transfer of Care (Signed)
 Immediate Anesthesia Transfer of Care Note  Patient: Scott [name]  Procedure(s) Performed: EXCISION, METATARSAL BONE, HEAD (Left: Toe)  Patient Location: PACU  Anesthesia Type:General  Level of Consciousness: awake, alert , and oriented  Airway & Oxygen  Therapy: Patient Spontanous Breathing and Patient connected to face mask oxygen   Post-op Assessment: Report given to RN and Post -op Vital signs reviewed and stable  Post vital signs: Reviewed and stable  Last Vitals:  Vitals Value Taken Time  BP 110/78 02/05/24 11:30  Temp 36.4 C 02/05/24 11:30  Pulse 99 02/05/24 11:32  Resp 13 02/05/24 11:32  SpO2 98 % 02/05/24 11:32  Vitals shown include unfiled device data.  Last Pain:  Vitals:   02/05/24 1008  TempSrc: Oral  PainSc: 0-No pain      Patients Stated Pain Goal: 4 (02/05/24 1008)  Complications: No notable events documented.

## 2024-02-05 NOTE — Progress Notes (Signed)
 PROGRESS NOTE    Scott [name]  FMW:[id number] DOB: September 21, 1960 DOA: 02/04/2024 PCP: Carlette Benita Area, MD   Brief Narrative:    Scott [name] is a 63 y.o. male with medical history significant for diabetes mellitus, hypertension, COPD, CKD 3, bipolar disorder, BPH. Patient presented to the ED with complaints of swelling, pain when he bears weight and ulceration to his left foot. Ulcer started about 2 weeks ago.  He saw his podiatrist - Dr. Fernande today and was referred to the ED. He was seen by podiatry and she is currently on IV antibiotics with plans for wound debridement and resection of metatarsal head planned for today.  Assessment & Plan:   Principal Problem:   Diabetic foot infection (HCC) Active Problems:   Bipolar 1 disorder, mixed (HCC)   Diabetes (HCC)   Essential hypertension, benign   COPD (chronic obstructive pulmonary disease) (HCC)   Chronic kidney disease, stage 3b (HCC)   BPH (benign prostatic hyperplasia)  Assessment and Plan:   Diabetic foot infection-ulcer on plantar surface, third metatarsal, extending to third webspace, no drainage or foul smell present.SABRA  MRI findings not suggestive of osteomyelitis, no focal fluid collection, also suggest possible devitalized plantar forefoot soft tissue.  Not meeting sepsis criteria.  Afebrile.  WBC 7.9.  Lactic acid 1.6.  History of complete ray amputation of first toe of left foot. - Continue IV vancomycin  and cefepime  - Follow-up blood cultures with no growth noted thus far - Podiatry plans for surgical management today   Diabetes mellitus-  - HgbA1c 7.5% - SSI- M - Resume PTA insulin  glargine at reduced dose 30 units daily( PTA dose 60 units)   COPD-stable. - Resume home regimen   Hypertension-systolic currently soft, ranging from 97-148 - Holding Norvasc  2.5 mg daily for now, candesartan 4 mg daily, metoprolol  12.5 twice daily, Lasix  20 mg daily - Resume meds pending stable blood pressure in a.m.    Bipolar disorder-  - Resume Depakote , risperidone     DVT prophylaxis: SCDs Code Status: Full Family Communication: None at bedside Disposition Plan:  Status is: Inpatient Remains inpatient appropriate because: Need for IV medications and inpatient procedure.   Consultants:  Podiatry  Procedures:  None  Antimicrobials:  Anti-infectives (From admission, onward)    Start     Dose/Rate Route Frequency Ordered Stop   02/05/24 1200  vancomycin  (VANCOREADY) IVPB 1500 mg/300 mL        1,500 mg 150 mL/hr over 120 Minutes Intravenous Every 24 hours 02/04/24 2103     02/04/24 2300  cefTRIAXone  (ROCEPHIN ) 2 g in sodium chloride  0.9 % 100 mL IVPB        2 g 200 mL/hr over 30 Minutes Intravenous Every 24 hours 02/04/24 2042     02/04/24 1300  vancomycin  (VANCOREADY) IVPB 2000 mg/400 mL        2,000 mg 200 mL/hr over 120 Minutes Intravenous  Once 02/04/24 1246 02/04/24 1726   02/04/24 1245  ceFEPIme  (MAXIPIME ) 2 g in sodium chloride  0.9 % 100 mL IVPB        2 g 200 mL/hr over 30 Minutes Intravenous  Once 02/04/24 1244 02/04/24 1544   02/04/24 1245  vancomycin  (VANCOCIN ) IVPB 1000 mg/200 mL premix  Status:  Discontinued        1,000 mg 200 mL/hr over 60 Minutes Intravenous  Once 02/04/24 1244 02/04/24 1246       Subjective: Patient seen and evaluated today with no new acute complaints or concerns. No acute  concerns or events noted overnight.  Objective: Vitals:   02/05/24 1138 02/05/24 1145 02/05/24 1156 02/05/24 1200  BP:  (!) 142/98  (!) 114/95  Pulse: 98 97 92 (!) 106  Resp: 16 20 10 17   Temp:   97.6 F (36.4 C)   TempSrc:      SpO2: 96% 97% 99% 98%  Weight:      Height:        Intake/Output Summary (Last 24 hours) at 02/05/2024 1238 Last data filed at 02/05/2024 1204 Gross per 24 hour  Intake 1300 ml  Output 1100 ml  Net 200 ml   Filed Weights   02/04/24 1206 02/04/24 1831 02/05/24 1008  Weight: 99.8 kg 98.6 kg 98.6 kg    Examination:  General exam:  Appears calm and comfortable  Respiratory system: Clear to auscultation. Respiratory effort normal. Cardiovascular system: S1 & S2 heard, RRR.  Gastrointestinal system: Abdomen is soft Central nervous system: Alert and awake Extremities: No edema Skin: No significant lesions noted Psychiatry: Flat affect.    Data Reviewed: I have personally reviewed following labs and imaging studies  CBC: Recent Labs  Lab 02/04/24 1353 02/05/24 0506  WBC 7.9 9.0  NEUTROABS 4.1  --   HGB 10.8* 11.5*  HCT 33.5* 35.9*  MCV 103.1* 100.8*  PLT 100* 165   Basic Metabolic Panel: Recent Labs  Lab 02/04/24 1353 02/05/24 0506  NA 142 144  K 4.4 4.4  CL 108 112*  CO2 22 23  GLUCOSE 92 79  BUN 36* 32*  CREATININE 1.96* 1.92*  CALCIUM  8.9 8.5*   GFR: Estimated Creatinine Clearance: 46.4 mL/min (A) (by C-G formula based on SCr of 1.92 mg/dL (H)). Liver Function Tests: Recent Labs  Lab 02/04/24 1353  AST 28  ALT 37  ALKPHOS 96  BILITOT 0.2  PROT 6.9  ALBUMIN 3.6   No results for input(s): LIPASE, AMYLASE in the last 168 hours. No results for input(s): AMMONIA in the last 168 hours. Coagulation Profile: No results for input(s): INR, PROTIME in the last 168 hours. Cardiac Enzymes: No results for input(s): CKTOTAL, CKMB, CKMBINDEX, TROPONINI in the last 168 hours. BNP (last 3 results) No results for input(s): PROBNP in the last 8760 hours. HbA1C: Recent Labs    02/04/24 1353  HGBA1C 7.5*   CBG: Recent Labs  Lab 02/04/24 1952 02/05/24 0010 02/05/24 0556 02/05/24 1004  GLUCAP 192* 144* 80 84   Lipid Profile: No results for input(s): CHOL, HDL, LDLCALC, TRIG, CHOLHDL, LDLDIRECT in the last 72 hours. Thyroid  Function Tests: No results for input(s): TSH, T4TOTAL, FREET4, T3FREE, THYROIDAB in the last 72 hours. Anemia Panel: No results for input(s): VITAMINB12, FOLATE, FERRITIN, TIBC, IRON, RETICCTPCT in the last 72  hours. Sepsis Labs: Recent Labs  Lab 02/04/24 1353  LATICACIDVEN 1.6    Recent Results (from the past 240 hours)  Blood Culture (routine x 2)     Status: None (Preliminary result)   Collection Time: 02/04/24  1:45 PM   Specimen: BLOOD RIGHT ARM  Result Value Ref Range Status   Specimen Description BLOOD RIGHT ARM  Final   Special Requests   Final    BOTTLES DRAWN AEROBIC AND ANAEROBIC Blood Culture results may not be optimal due to an inadequate volume of blood received in culture bottles   Culture   Final    NO GROWTH < 24 HOURS Performed at [facility], [address], [place], [place] [address]    Report Status PENDING  Incomplete  Blood  Culture (routine x 2)     Status: None (Preliminary result)   Collection Time: 02/04/24  1:51 PM   Specimen: BLOOD LEFT FOREARM  Result Value Ref Range Status   Specimen Description BLOOD LEFT FOREARM  Final   Special Requests   Final    BOTTLES DRAWN AEROBIC ONLY Blood Culture adequate volume   Culture   Final    NO GROWTH < 24 HOURS Performed at [facility], [address] [place], [place] [address]    Report Status PENDING  Incomplete  Surgical PCR screen     Status: None   Collection Time: 02/04/24 11:40 PM   Specimen: Nasal Mucosa; Nasal Swab  Result Value Ref Range Status   MRSA, PCR NEGATIVE NEGATIVE Final   Staphylococcus aureus NEGATIVE NEGATIVE Final    Comment: (NOTE) The Xpert SA Assay (FDA approved for NASAL specimens in patients 73 years of age and older), is one component of a comprehensive surveillance program. It is not intended to diagnose infection nor to guide or monitor treatment. Performed at [facility], [address] [place], [place] [address]          Radiology Studies: US  ARTERIAL ABI (SCREENING LOWER EXTREMITY) Result Date: 02/04/2024 CLINICAL DATA:  Nonhealing wound left foot and history of diabetes. EXAM: NONINVASIVE PHYSIOLOGIC VASCULAR STUDY OF BILATERAL LOWER EXTREMITIES TECHNIQUE:  Evaluation of both lower extremities were performed at rest, including calculation of ankle-brachial indices with single level Doppler, pressure and pulse volume recording. COMPARISON:  Prior study on 03/26/2023 FINDINGS: Right ABI:  0.94.  Previously 1.09. Left ABI:  0.88.  Previously 1.03. Right Lower Extremity: Biphasic posterior tibial and monophasic dorsalis pedis waveforms. Left Lower Extremity: Biphasic posterior tibial and monophasic dorsalis pedis waveforms. 0.8-0.89 Mild PAD IMPRESSION: Slightly depressed bilateral resting ankle-brachial indices with some reduction compared to a prior study and further distal waveform abnormalities suggestive of some progression of peripheral vascular disease in both lower extremities. Electronically Signed   By: Marcey Moan M.D.   On: 02/04/2024 16:07   MR FOOT LEFT W WO CONTRAST Result Date: 02/04/2024 CLINICAL DATA:  Foot swelling, diabetic, osteomyelitis suspected, xray done EXAM: MRI OF THE LEFT FOREFOOT WITHOUT AND WITH CONTRAST TECHNIQUE: Multiplanar, multisequence MR imaging of the left forefoot was performed both before and after administration of intravenous contrast. CONTRAST:  10mL GADAVIST  GADOBUTROL  1 MMOL/ML IV SOLN COMPARISON:  Radiographs 02/04/2024 and 06/15/2023. Left foot MRI 03/25/2023. FINDINGS: Bones/Joint/Cartilage Since the previous MRI, the patient has undergone amputation through the 1st tarsometatarsal joint. There is new posterior dislocation at the 2nd metatarsophalangeal joint associated with chronic appearing erosions of the 2nd metatarsal head. No associated marrow edema to suggest active osteomyelitis. No significant joint effusions. The additional rays appear intact. Chronic mild midfoot degenerative changes with subchondral cyst formation laterally in the navicular and at the 3rd tarsometatarsal joint. No significant joint effusions. Ligaments Intact Lisfranc ligament. The collateral ligaments of the 3rd through 5th  metatarsophalangeal joints appear intact. Muscles and Tendons Diffuse fatty atrophy throughout the forefoot musculature. No tendon tear or significant tenosynovitis identified. Soft tissues Expected postsurgical changes from previous 1st ray amputation. There is a questionable plantar forefoot wound extending from the head of the 3rd metatarsal into the 3rd web space, best seen on image 17/6. No focal fluid collection is seen in this area. The postcontrast images demonstrate decreased enhancement within the plantar forefoot soft tissues, a finding which suggests possible devitalized soft tissue. Nonspecific dorsal forefoot skin thickening and subcutaneous edema. IMPRESSION: 1.  Interval amputation through the 1st tarsometatarsal joint. 2. New posterior dislocation at the 2nd metatarsophalangeal joint with chronic appearing erosions of the 2nd metatarsal head. No associated marrow edema to suggest active osteomyelitis. 3. Possible plantar forefoot wound extending from the head of the 3rd metatarsal into the 3rd web space. No focal fluid collection identified. 4. Decreased enhancement within the plantar forefoot soft tissues, suggesting possible devitalized soft tissue. 5. Nonspecific dorsal forefoot skin thickening and subcutaneous edema. Electronically Signed   By: Elsie Perone M.D.   On: 02/04/2024 15:26   DG Foot 2 Views Left Result Date: 02/04/2024 EXAM: 2 VIEW(S) XRAY OF THE LEFT FOOT 02/04/2024 01:09:00 PM COMPARISON: 06/15/2023 CLINICAL HISTORY: Left foot wound. Pt arrived via POV from the foot doctor for further evaluation of left diabetic foot ulcer between his 3rd and 4th toes. Pt reports he is a resident at [facility], and they do have a wound care nurse but his wound is not healing. Hx of diabetes. FINDINGS: BONES AND JOINTS: Status post amputation of the first ray as noted on prior study. Probable fusion of the talus and calcaneus. Probable lytic destruction of the distal portion of the second  metatarsal of indeterminate age. No acute fracture. No joint dislocation. SOFT TISSUES: The soft tissues are unremarkable. IMPRESSION: 1. Probable lytic destruction of the distal portion of the second metatarsal, indeterminate age, concerning for osteomyelitis . 2. Status post first ray amputation. Electronically signed by: Lynwood Seip MD 02/04/2024 01:37 PM EDT RP Workstation: [id number]        Scheduled Meds:  atorvastatin   40 mg Oral Daily   benztropine   1 mg Oral BID   divalproex   1,000 mg Oral QPM   divalproex   500 mg Oral q AM   finasteride   5 mg Oral Daily   gabapentin   100 mg Oral QHS   insulin  aspart  0-15 Units Subcutaneous Q6H   insulin  glargine-yfgn  30 Units Subcutaneous QHS   mupirocin  ointment  1 Application Nasal BID   risperiDONE   2 mg Oral Daily   tamsulosin   0.4 mg Oral QPC supper   traZODone   50 mg Oral QHS   Continuous Infusions:  cefTRIAXone  (ROCEPHIN )  IV 2 g (02/04/24 2347)   vancomycin        LOS: 1 day    Time spent: 55 minutes    Harryette Shuart D Maree, DO [facility]  If 7PM-7AM, please contact night-coverage [email or web address] 02/05/2024, 12:38 PM

## 2024-02-05 NOTE — H&P (Signed)
.  HISTORY AND PHYSICAL INTERVAL NOTE:  02/05/2024  10:03 AM  Scott [name]  has presented today for surgery, with the diagnosis of Chronic Ulcer Left foot.  The various methods of treatment have been discussed with the patient.  No guarantees were given.  After consideration of risks, benefits and other options for treatment, the patient has consented to surgery.  I have reviewed the patients' chart and labs.    Patient Vitals for the past 24 hrs:  BP Temp Temp src Pulse Resp SpO2 Height Weight  02/05/24 0551 111/67 (!) 97.5 F (36.4 C) Oral 69 20 92 % -- --  02/05/24 0224 111/69 98 F (36.7 C) Oral 67 18 91 % -- --  02/04/24 2213 112/67 98.8 F (37.1 C) Oral 69 16 96 % -- --  02/04/24 1831 -- -- -- -- -- -- 6' 2 (1.88 m) 98.6 kg  02/04/24 1830 97/86 (!) 97.4 F (36.3 C) Oral (!) 102 20 97 % -- --  02/04/24 1630 129/75 -- -- 79 16 93 % -- --  02/04/24 1615 112/76 -- -- 81 -- 95 % -- --  02/04/24 1600 123/81 97.9 F (36.6 C) Oral 75 16 95 % -- --  02/04/24 1206 -- -- -- -- -- -- 5' 9 (1.753 m) 99.8 kg  02/04/24 1205 (!) 148/73 97.7 F (36.5 C) Oral 77 18 99 % -- --    A history and physical examination was performed in my office.  The patient was reexamined.  There have been no changes to this history and physical examination.  Michal Blanch, DPM

## 2024-02-05 NOTE — Anesthesia Preprocedure Evaluation (Addendum)
 Anesthesia Evaluation  Patient identified by MRN, date of birth, ID band Patient awake    Reviewed: Allergy & Precautions, H&P , NPO status , Patient's Chart, lab work & pertinent test results  Airway Mallampati: II  TM Distance: >3 FB Neck ROM: Full    Dental  (+) Edentulous Lower, Edentulous Upper   Pulmonary neg pulmonary ROS, pneumonia, COPD   Pulmonary exam normal breath sounds clear to auscultation       Cardiovascular hypertension, negative cardio ROS Normal cardiovascular exam Rhythm:Irregular Rate:Normal  12 lead EKG shows ST No significant change from 3/25 EKG   Neuro/Psych  PSYCHIATRIC DISORDERS  Depression Bipolar Disorder    Neuromuscular disease negative neurological ROS  negative psych ROS   GI/Hepatic negative GI ROS, Neg liver ROS,,,  Endo/Other  diabetes    Renal/GU Renal diseasenegative Renal ROS  negative genitourinary   Musculoskeletal negative musculoskeletal ROS (+) Arthritis ,    Abdominal   Peds negative pediatric ROS (+)  Hematology negative hematology ROS (+)   Anesthesia Other Findings   Reproductive/Obstetrics negative OB ROS                              Anesthesia Physical Anesthesia Plan  ASA: 3  Anesthesia Plan: General   Post-op Pain Management:    Induction: Intravenous  PONV Risk Score and Plan:   Airway Management Planned:   Additional Equipment:   Intra-op Plan:   Post-operative Plan:   Informed Consent: I have reviewed the patients History and Physical, chart, labs and discussed the procedure including the risks, benefits and alternatives for the proposed anesthesia with the patient or authorized representative who has indicated his/her understanding and acceptance.       Plan Discussed with: CRNA  Anesthesia Plan Comments:          Anesthesia Quick Evaluation
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
## 2024-02-05 NOTE — Anesthesia Postprocedure Evaluation (Signed)
 Anesthesia Post Note  Patient: Davine Coba Channell  Procedure(s) Performed: EXCISION, METATARSAL BONE, HEAD (Left: Toe)  Patient location during evaluation: PACU Anesthesia Type: General Level of consciousness: awake and alert Pain management: pain level controlled Vital Signs Assessment: post-procedure vital signs reviewed and stable Respiratory status: spontaneous breathing, nonlabored ventilation, respiratory function stable and patient connected to nasal cannula oxygen  Cardiovascular status: blood pressure returned to baseline and stable Postop Assessment: no apparent nausea or vomiting Anesthetic complications: no   No notable events documented.   Last Vitals:  Vitals:   02/05/24 1156 02/05/24 1200  BP:  (!) 114/95  Pulse: 92 (!) 106  Resp: 10 17  Temp: 36.4 C   SpO2: 99% 98%    Last Pain:  Vitals:   02/05/24 1200  TempSrc:   PainSc: 0-No pain                 Andrea Limes

## 2024-02-05 NOTE — Brief Op Note (Signed)
 02/05/2024  11:30 AM  PATIENT:  Scott [name]  63 y.o. male  PRE-OPERATIVE DIAGNOSIS:  Chronic Ulcer Left foot  POST-OPERATIVE DIAGNOSIS:  Chronic Ulcer Left foot  PROCEDURE:  Procedure(s): EXCISION, METATARSAL BONE, HEAD (Left) Debridement of the the left foot ulcer.   SURGEON:  Surgeons and Role:    * Tobie Buckles, DPM - Primary  PHYSICIAN ASSISTANT:   ASSISTANTS: none   ANESTHESIA:   local and MAC  EBL:  None  BLOOD ADMINISTERED:none  DRAINS: none   LOCAL MEDICATIONS USED:  MARCAINE   , LIDOCAINE  , and Amount: 10 ml  SPECIMEN:  Source of Specimen:  left foot third metatarsal head  DISPOSITION OF SPECIMEN:  PATHOLOGY  COUNTS:  YES  TOURNIQUET:   Total Tourniquet Time Documented: Ankle (Left) - 28 minutes Total: Ankle (Left) - 28 minutes   DICTATION: .Nechama Dictation  PLAN OF CARE: Admit to inpatient   PATIENT DISPOSITION:  PACU - hemodynamically stable.   Delay start of Pharmacological VTE agent (>24hrs) due to surgical blood loss or risk of bleeding: not applicable

## 2024-02-06 ENCOUNTER — Encounter (HOSPITAL_COMMUNITY): Payer: Self-pay | Admitting: Podiatry

## 2024-02-06 DIAGNOSIS — L089 Local infection of the skin and subcutaneous tissue, unspecified: Secondary | ICD-10-CM | POA: Diagnosis not present

## 2024-02-06 DIAGNOSIS — E11628 Type 2 diabetes mellitus with other skin complications: Secondary | ICD-10-CM | POA: Diagnosis not present

## 2024-02-06 LAB — BASIC METABOLIC PANEL WITH GFR
Anion gap: 11 (ref 5–15)
BUN: 29 mg/dL — ABNORMAL HIGH (ref 8–23)
CO2: 20 mmol/L — ABNORMAL LOW (ref 22–32)
Calcium: 8.5 mg/dL — ABNORMAL LOW (ref 8.9–10.3)
Chloride: 110 mmol/L (ref 98–111)
Creatinine, Ser: 1.91 mg/dL — ABNORMAL HIGH (ref 0.61–1.24)
GFR, Estimated: 39 mL/min — ABNORMAL LOW (ref >60)
Glucose, Bld: 105 mg/dL — ABNORMAL HIGH (ref 70–99)
Potassium: 4.4 mmol/L (ref 3.5–5.1)
Sodium: 141 mmol/L (ref 135–145)

## 2024-02-06 LAB — CBC
HCT: 37.8 % — ABNORMAL LOW (ref 39.0–52.0)
Hemoglobin: 11.7 g/dL — ABNORMAL LOW (ref 13.0–17.0)
MCH: 31.6 pg (ref 26.0–34.0)
MCHC: 31 g/dL (ref 30.0–36.0)
MCV: 102.2 fL — ABNORMAL HIGH (ref 80.0–100.0)
Platelets: 165 K/uL (ref 150–400)
RBC: 3.7 MIL/uL — ABNORMAL LOW (ref 4.22–5.81)
RDW: 14.2 % (ref 11.5–15.5)
WBC: 10.7 K/uL — ABNORMAL HIGH (ref 4.0–10.5)
nRBC: 0 % (ref 0.0–0.2)

## 2024-02-06 LAB — GLUCOSE, CAPILLARY
Glucose-Capillary: 86 mg/dL (ref 70–99)
Glucose-Capillary: 94 mg/dL (ref 70–99)
Glucose-Capillary: 244 mg/dL — ABNORMAL HIGH (ref 70–99)

## 2024-02-06 LAB — MAGNESIUM: Magnesium: 2.3 mg/dL (ref 1.7–2.4)

## 2024-02-06 MED ORDER — DOXYCYCLINE HYCLATE 100 MG PO TABS: 100.0000 mg | ORAL_TABLET | Freq: Two times a day (BID) | ORAL | 0 refills | Status: AC

## 2024-02-06 MED ORDER — TRAMADOL HCL 50 MG PO TABS: 50.0000 mg | ORAL_TABLET | Freq: Four times a day (QID) | ORAL | 0 refills | Status: AC | PRN

## 2024-02-06 NOTE — Plan of Care (Signed)
 Problem: Education: Goal: Knowledge of General Education information will improve Description: Including pain rating scale, medication(s)/side effects and non-pharmacologic comfort measures 02/06/2024 1018 by Mathews Norleen POUR, RN Outcome: Adequate for Discharge 02/06/2024 0723 by Mathews Norleen POUR, RN Outcome: Progressing   Problem: Health Behavior/Discharge Planning: Goal: Ability to manage health-related needs will improve 02/06/2024 1018 by Mathews Norleen POUR, RN Outcome: Adequate for Discharge 02/06/2024 0723 by Mathews Norleen POUR, RN Outcome: Progressing   Problem: Clinical Measurements: Goal: Ability to maintain clinical measurements within normal limits will improve 02/06/2024 1018 by Mathews Norleen POUR, RN Outcome: Adequate for Discharge 02/06/2024 0723 by Mathews Norleen POUR, RN Outcome: Progressing Goal: Will remain free from infection 02/06/2024 1018 by Mathews Norleen POUR, RN Outcome: Adequate for Discharge 02/06/2024 0723 by Mathews Norleen POUR, RN Outcome: Progressing Goal: Diagnostic test results will improve 02/06/2024 1018 by Mathews Norleen POUR, RN Outcome: Adequate for Discharge 02/06/2024 0723 by Mathews Norleen POUR, RN Outcome: Progressing Goal: Respiratory complications will improve 02/06/2024 1018 by Mathews Norleen POUR, RN Outcome: Adequate for Discharge 02/06/2024 0723 by Mathews Norleen POUR, RN Outcome: Progressing Goal: Cardiovascular complication will be avoided 02/06/2024 1018 by Mathews Norleen POUR, RN Outcome: Adequate for Discharge 02/06/2024 0723 by Mathews Norleen POUR, RN Outcome: Progressing   Problem: Activity: Goal: Risk for activity intolerance will decrease 02/06/2024 1018 by Mathews Norleen POUR, RN Outcome: Adequate for Discharge 02/06/2024 0723 by Mathews Norleen POUR, RN Outcome: Progressing   Problem: Nutrition: Goal: Adequate nutrition will be maintained 02/06/2024 1018 by Mathews Norleen POUR, RN Outcome: Adequate for Discharge 02/06/2024 0723 by Mathews Norleen POUR, RN Outcome: Progressing   Problem:  Coping: Goal: Level of anxiety will decrease 02/06/2024 1018 by Mathews Norleen POUR, RN Outcome: Adequate for Discharge 02/06/2024 0723 by Mathews Norleen POUR, RN Outcome: Progressing   Problem: Elimination: Goal: Will not experience complications related to bowel motility 02/06/2024 1018 by Mathews Norleen POUR, RN Outcome: Adequate for Discharge 02/06/2024 0723 by Mathews Norleen POUR, RN Outcome: Progressing Goal: Will not experience complications related to urinary retention 02/06/2024 1018 by Mathews Norleen POUR, RN Outcome: Adequate for Discharge 02/06/2024 0723 by Mathews Norleen POUR, RN Outcome: Progressing   Problem: Pain Managment: Goal: General experience of comfort will improve and/or be controlled 02/06/2024 1018 by Mathews Norleen POUR, RN Outcome: Adequate for Discharge 02/06/2024 0723 by Mathews Norleen POUR, RN Outcome: Progressing   Problem: Safety: Goal: Ability to remain free from injury will improve 02/06/2024 1018 by Mathews Norleen POUR, RN Outcome: Adequate for Discharge 02/06/2024 0723 by Mathews Norleen POUR, RN Outcome: Progressing   Problem: Skin Integrity: Goal: Risk for impaired skin integrity will decrease 02/06/2024 1018 by Mathews Norleen POUR, RN Outcome: Adequate for Discharge 02/06/2024 0723 by Mathews Norleen POUR, RN Outcome: Progressing   Problem: Education: Goal: Ability to describe self-care measures that may prevent or decrease complications (Diabetes Survival Skills Education) will improve 02/06/2024 1018 by Mathews Norleen POUR, RN Outcome: Adequate for Discharge 02/06/2024 0723 by Mathews Norleen POUR, RN Outcome: Progressing Goal: Individualized Educational Video(s) 02/06/2024 1018 by Mathews Norleen POUR, RN Outcome: Adequate for Discharge 02/06/2024 0723 by Mathews Norleen POUR, RN Outcome: Progressing   Problem: Coping: Goal: Ability to adjust to condition or change in health will improve 02/06/2024 1018 by Mathews Norleen POUR, RN Outcome: Adequate for Discharge 02/06/2024 0723 by Mathews Norleen POUR, RN Outcome: Progressing    Problem: Fluid Volume: Goal: Ability to maintain a balanced intake and output will improve 02/06/2024 1018 by Mathews Norleen POUR, RN Outcome: Adequate for Discharge 02/06/2024  [id number] by Mathews Norleen POUR, RN Outcome: Progressing   Problem: Health Behavior/Discharge Planning: Goal: Ability to identify and utilize available resources and services will improve 02/06/2024 1018 by Mathews Norleen POUR, RN Outcome: Adequate for Discharge 02/06/2024 0723 by Mathews Norleen POUR, RN Outcome: Progressing Goal: Ability to manage health-related needs will improve 02/06/2024 1018 by Lavone Barrientes, Norleen POUR, RN Outcome: Adequate for Discharge 02/06/2024 0723 by Mathews Norleen POUR, RN Outcome: Progressing   Problem: Metabolic: Goal: Ability to maintain appropriate glucose levels will improve 02/06/2024 1018 by Mathews Norleen POUR, RN Outcome: Adequate for Discharge 02/06/2024 0723 by Mathews Norleen POUR, RN Outcome: Progressing   Problem: Nutritional: Goal: Maintenance of adequate nutrition will improve 02/06/2024 1018 by Mathews Norleen POUR, RN Outcome: Adequate for Discharge 02/06/2024 0723 by Mathews Norleen POUR, RN Outcome: Progressing Goal: Progress toward achieving an optimal weight will improve 02/06/2024 1018 by Mathews Norleen POUR, RN Outcome: Adequate for Discharge 02/06/2024 0723 by Mathews Norleen POUR, RN Outcome: Progressing   Problem: Skin Integrity: Goal: Risk for impaired skin integrity will decrease 02/06/2024 1018 by Mathews Norleen POUR, RN Outcome: Adequate for Discharge 02/06/2024 0723 by Mathews Norleen POUR, RN Outcome: Progressing   Problem: Tissue Perfusion: Goal: Adequacy of tissue perfusion will improve 02/06/2024 1018 by Mathews Norleen POUR, RN Outcome: Adequate for Discharge 02/06/2024 0723 by Mathews Norleen POUR, RN Outcome: Progressing

## 2024-02-06 NOTE — Plan of Care (Signed)

## 2024-02-06 NOTE — TOC Transition Note (Signed)
 Transition of Care [facility]) - Discharge Note   Patient Details  Name: Scott [name] MRN: [id number] Date of Birth: 1961/03/15  [facility] CM/SW Contact:  Sharlyne Stabs, RN Phone Number: 02/06/2024, 11:15 AM   Clinical Narrative:   Patient is medically ready to return to [facility]. CM called Kyra COMA and discharge summary sent. MD ordering home health PT/RN. Kyra requested [employer]. Referral sent, [facility] accepted. CM left his legal Guardian, Scott [name] to update her with the discharge plan. [place] with provide transportation. RN updated.    Final next level of care: Assisted Living Barriers to Discharge: Barriers Resolved   Patient Goals and CMS Choice Patient states their goals for this hospitalization and ongoing recovery are:: return to ALF   Choice offered to / list presented to : [facility] POA / Guardian [place] ownership interest in [facility].provided to::  (n/a)    Discharge Placement              Patient to be transferred to facility by: ALF Staff Name of family member notified: Legal Guardian Patient and family notified of of transfer: 02/06/24  Discharge Plan and Services Additional resources added to the After Visit Summary for   In-house Referral: Clinical Social Work   Post Acute Care Choice: Resumption of Svcs/PTA Provider                 HH Arranged: PT, RN HH Agency: Other - See comment [employer]/film/video) Date HH Agency Contacted: 02/06/24 Time HH Agency Contacted: [id number] Representative spoke with at [facility] Agency: Sarah  Social Drivers of Health (SDOH) Interventions SDOH Screenings   Food Insecurity: No Food Insecurity (02/04/2024)  Housing: Low Risk  (02/04/2024)  Transportation Needs: No Transportation Needs (02/04/2024)  Utilities: Not At Risk (02/04/2024)  Social Connections: Socially Isolated (02/04/2024)  Tobacco Use: Low Risk  (02/05/2024)     Readmission Risk Interventions    02/06/2024   11:12 AM 02/05/2024    1:23  PM 06/18/2023    2:13 PM  Readmission Risk Prevention Plan  Transportation Screening Complete Complete Complete  PCP or Specialist Appt within 3-5 Days Complete    HRI or Home Care Consult Complete Complete   Social Work Consult for Recovery Care Planning/Counseling Complete Complete   Palliative Care Screening Not Applicable Not Applicable   Medication Review Oceanographer) Complete Complete Complete  HRI or Home Care Consult   Complete  SW Recovery Care/Counseling Consult   Complete  Palliative Care Screening   Not Applicable  Skilled Nursing Facility   Not Applicable

## 2024-02-06 NOTE — Discharge Summary (Signed)
 Physician Discharge Summary  Scott [name] FMW:[id number] DOB: Jun 22, 1960 DOA: 02/04/2024  PCP: Fanta, Tesfaye Demissie, MD  Admit date: 02/04/2024  Discharge date: 02/06/2024  Admitted From:ALF  Disposition:  ALF  Recommendations for Outpatient Follow-up:  Follow up with PCP in 1-2 weeks Follow-up with podiatry Dr. Tobie as scheduled outpatient Remain on doxycycline  100 mg twice daily for 10 days as prescribed Ultram  prescribed for pain management as needed Continue other home medications as prior  Home Health: None  Equipment/Devices: None none  Discharge Condition:Stable  CODE STATUS: Full  Diet recommendation: Heart Healthy/carb modified  Brief/Interim Summary: Scott [name] is a 63 y.o. male with medical history significant for diabetes mellitus, hypertension, COPD, CKD 3, bipolar disorder, BPH. Patient presented to the ED with complaints of swelling, pain when he bears weight and ulceration to his left foot. Ulcer started about 2 weeks ago.  He saw his podiatrist - Dr. Fernande today and was referred to the ED. He was seen by podiatry and underwent debridement of his left foot ulcer as well as excision of the head of the metatarsal bone on the same side on 10/29.  He apparently tolerated the procedure well and has minimal pain and is tolerating his diet.  He may easily transition to doxycycline  for another 10 days as prescribed per podiatry recommendations and will remain on Ultram  as needed for pain control.  No other acute events or concerns noted and he is overall stable for discharge and will have close follow-up with podiatry.  Discharge Diagnoses:  Principal Problem:   Diabetic foot infection (HCC) Active Problems:   Bipolar 1 disorder, mixed (HCC)   Diabetes (HCC)   Essential hypertension, benign   COPD (chronic obstructive pulmonary disease) (HCC)   Chronic kidney disease, stage 3b (HCC)   BPH (benign prostatic hyperplasia)  Principal discharge diagnosis:  Chronic left foot ulceration in the setting of diabetes with suspected infection.  Status postdebridement of ulcer as well as metatarsal bone excision 10/29.  Discharge Instructions  Discharge Instructions     Diet - low sodium heart healthy   Complete by: As directed    Increase activity slowly   Complete by: As directed    Leave dressing on - Keep it clean, dry, and intact until clinic visit   Complete by: As directed       Allergies as of 02/06/2024   No Known Allergies      Medication List     TAKE these medications    amLODipine  2.5 MG tablet Commonly known as: NORVASC  Take 2.5 mg by mouth daily.   atorvastatin  40 MG tablet Commonly known as: LIPITOR Take 40 mg by mouth daily.   Basaglar  KwikPen 100 UNIT/ML Inject 60 Units into the skin at bedtime.   BD AutoShield Duo 30G X 5 MM Misc Generic drug: Insulin  Pen Needle USE FOR ONCE DAILY LANTUS  ADMINISTRATION.   Insulin  Pen Needle 31G X 5 MM Misc Use with insulin  pen to dispense insulin  as directed   DropSafe Safety Pen Needles 31G X 6 MM Misc Generic drug: Insulin  Pen Needle Use to inject insulin  4 times daily   benztropine  1 MG tablet Commonly known as: COGENTIN  Take 1 mg by mouth 2 (two) times daily.   candesartan 4 MG tablet Commonly known as: ATACAND Take 4 mg by mouth daily.   cetirizine 10 MG chewable tablet Commonly known as: ZYRTEC Chew 10 mg by mouth daily.   Daily-Vite Tabs Take 1 tablet by mouth daily.  divalproex  500 MG DR tablet Commonly known as: DEPAKOTE  Take 500-1,000 mg by mouth 2 (two) times daily. Take 1 tab am and 2 tabs pm   doxycycline  100 MG tablet Commonly known as: VIBRA -TABS Take 1 tablet (100 mg total) by mouth 2 (two) times daily for 10 days.   EasyMax Test test strip Generic drug: glucose blood USE AS DIRECTED TO CHECK BLOOD SUGAR FOUR DAILY.   finasteride  5 MG tablet Commonly known as: PROSCAR  Take 1 tablet (5 mg total) by mouth daily.   furosemide  20 MG  tablet Commonly known as: LASIX  Take 1 tablet (20 mg total) by mouth daily.   gabapentin  100 MG capsule Commonly known as: NEURONTIN  Take 100 mg by mouth at bedtime.   Incruse Ellipta  62.5 MCG/ACT Aepb Generic drug: umeclidinium bromide  Inhale 1 puff into the lungs daily.   ketorolac  0.4 % Soln Commonly known as: ACULAR  Place 1 drop into the right eye See admin instructions. 4 times daily starting 1 day before surgery   Lancets Misc 1 each by Does not apply route 4 (four) times daily.   lithium  carbonate 300 MG capsule Take 300 mg by mouth in the morning and at bedtime.   loratadine  10 MG tablet Commonly known as: CLARITIN  Take 10 mg by mouth daily.   Melatonin 3 MG Subl Place 3 mg under the tongue at bedtime.   metoprolol  tartrate 25 MG tablet Commonly known as: LOPRESSOR  Take 0.5 tablets (12.5 mg total) by mouth 2 (two) times daily. What changed: how much to take   NovoLOG  FlexPen 100 UNIT/ML FlexPen Generic drug: insulin  aspart INJECT 5 UNITS SUBCUTANEOUSLY THREE TIMES DAILY W/ MEALS IF BGIS 90-150 & EATING. HOLD INSULIN  IF BG <90, ADD SSI IF >400. ADD SLIDING SCALE FOR BG: 151-200=1U; 201-250=2U; 251-300=3U; 301-350=4U; 351-400=5U; >400=6U & CALL MD.   nystatin powder Apply 1 Application topically 3 (three) times daily. Apply to groin area three times daily   ofloxacin 0.3 % ophthalmic solution Commonly known as: OCUFLOX Place 1 drop into the right eye See admin instructions. Instill 1 drop into right eye 4 times daily starting 1 day before surgery   prednisoLONE acetate 1 % ophthalmic suspension Commonly known as: PRED FORTE Place 1 drop into the right eye See admin instructions. Instill 1 drop into right eye 4 times daily for 1 week, then 3 times daily for 1 week, twice daily for 1 week then daily for 1 week then stop   Refresh Optive Mega-3 0.5-1-0.5 % Soln Generic drug: Carboxymeth-Glyc-Polysorb PF   risperiDONE  2 MG tablet Commonly known as:  RISPERDAL  Take 1 tablet (2 mg total) by mouth daily.   silodosin  8 MG Caps capsule Commonly known as: RAPAFLO  Take 1 capsule (8 mg total) by mouth at bedtime.   traMADol  50 MG tablet Commonly known as: ULTRAM  Take 1 tablet (50 mg total) by mouth every 6 (six) hours as needed for moderate pain (pain score 4-6) or severe pain (pain score 7-10).   traZODone  50 MG tablet Commonly known as: DESYREL  Take 50 mg by mouth at bedtime.   Zinc  Sulfate 220 (50 Zn) MG Tabs Take 1 tablet by mouth daily.               Discharge Care Instructions  (From admission, onward)           Start     Ordered   02/06/24 0000  Leave dressing on - Keep it clean, dry, and intact until clinic visit  02/06/24 9056            Follow-up Information     Tobie Buckles, DPM. Go to.   Specialty: Podiatry               No Known Allergies  Consultations: Podiatry   Procedures/Studies: US  ARTERIAL ABI (SCREENING LOWER EXTREMITY) Result Date: 02/04/2024 CLINICAL DATA:  Nonhealing wound left foot and history of diabetes. EXAM: NONINVASIVE PHYSIOLOGIC VASCULAR STUDY OF BILATERAL LOWER EXTREMITIES TECHNIQUE: Evaluation of both lower extremities were performed at rest, including calculation of ankle-brachial indices with single level Doppler, pressure and pulse volume recording. COMPARISON:  Prior study on 03/26/2023 FINDINGS: Right ABI:  0.94.  Previously 1.09. Left ABI:  0.88.  Previously 1.03. Right Lower Extremity: Biphasic posterior tibial and monophasic dorsalis pedis waveforms. Left Lower Extremity: Biphasic posterior tibial and monophasic dorsalis pedis waveforms. 0.8-0.89 Mild PAD IMPRESSION: Slightly depressed bilateral resting ankle-brachial indices with some reduction compared to a prior study and further distal waveform abnormalities suggestive of some progression of peripheral vascular disease in both lower extremities. Electronically Signed   By: Marcey Moan M.D.    On: 02/04/2024 16:07   MR FOOT LEFT W WO CONTRAST Result Date: 02/04/2024 CLINICAL DATA:  Foot swelling, diabetic, osteomyelitis suspected, xray done EXAM: MRI OF THE LEFT FOREFOOT WITHOUT AND WITH CONTRAST TECHNIQUE: Multiplanar, multisequence MR imaging of the left forefoot was performed both before and after administration of intravenous contrast. CONTRAST:  10mL GADAVIST  GADOBUTROL  1 MMOL/ML IV SOLN COMPARISON:  Radiographs 02/04/2024 and 06/15/2023. Left foot MRI 03/25/2023. FINDINGS: Bones/Joint/Cartilage Since the previous MRI, the patient has undergone amputation through the 1st tarsometatarsal joint. There is new posterior dislocation at the 2nd metatarsophalangeal joint associated with chronic appearing erosions of the 2nd metatarsal head. No associated marrow edema to suggest active osteomyelitis. No significant joint effusions. The additional rays appear intact. Chronic mild midfoot degenerative changes with subchondral cyst formation laterally in the navicular and at the 3rd tarsometatarsal joint. No significant joint effusions. Ligaments Intact Lisfranc ligament. The collateral ligaments of the 3rd through 5th metatarsophalangeal joints appear intact. Muscles and Tendons Diffuse fatty atrophy throughout the forefoot musculature. No tendon tear or significant tenosynovitis identified. Soft tissues Expected postsurgical changes from previous 1st ray amputation. There is a questionable plantar forefoot wound extending from the head of the 3rd metatarsal into the 3rd web space, best seen on image 17/6. No focal fluid collection is seen in this area. The postcontrast images demonstrate decreased enhancement within the plantar forefoot soft tissues, a finding which suggests possible devitalized soft tissue. Nonspecific dorsal forefoot skin thickening and subcutaneous edema. IMPRESSION: 1. Interval amputation through the 1st tarsometatarsal joint. 2. New posterior dislocation at the 2nd  metatarsophalangeal joint with chronic appearing erosions of the 2nd metatarsal head. No associated marrow edema to suggest active osteomyelitis. 3. Possible plantar forefoot wound extending from the head of the 3rd metatarsal into the 3rd web space. No focal fluid collection identified. 4. Decreased enhancement within the plantar forefoot soft tissues, suggesting possible devitalized soft tissue. 5. Nonspecific dorsal forefoot skin thickening and subcutaneous edema. Electronically Signed   By: Elsie Perone M.D.   On: 02/04/2024 15:26   DG Foot 2 Views Left Result Date: 02/04/2024 EXAM: 2 VIEW(S) XRAY OF THE LEFT FOOT 02/04/2024 01:09:00 PM COMPARISON: 06/15/2023 CLINICAL HISTORY: Left foot wound. Pt arrived via POV from the foot doctor for further evaluation of left diabetic foot ulcer between his 3rd and 4th toes. Pt reports he is a resident at  [facility], and they do have a wound care nurse but his wound is not healing. Hx of diabetes. FINDINGS: BONES AND JOINTS: Status post amputation of the first ray as noted on prior study. Probable fusion of the talus and calcaneus. Probable lytic destruction of the distal portion of the second metatarsal of indeterminate age. No acute fracture. No joint dislocation. SOFT TISSUES: The soft tissues are unremarkable. IMPRESSION: 1. Probable lytic destruction of the distal portion of the second metatarsal, indeterminate age, concerning for osteomyelitis . 2. Status post first ray amputation. Electronically signed by: Lynwood Seip MD 02/04/2024 01:37 PM EDT RP Workstation: [id number]     Discharge Exam: Vitals:   02/05/24 1933 02/06/24 0251  BP: 110/72 (!) 130/96  Pulse: 80 84  Resp: 18 18  Temp: 98.2 F (36.8 C) 98.2 F (36.8 C)  SpO2: 92% 92%   Vitals:   02/05/24 1200 02/05/24 1422 02/05/24 1933 02/06/24 0251  BP: (!) 114/95 134/84 110/72 (!) 130/96  Pulse: (!) 106 87 80 84  Resp: 17 18 18 18   Temp:  97.8 F (36.6 C) 98.2 F (36.8 C) 98.2 F  (36.8 C)  TempSrc:  Oral Oral Oral  SpO2: 98% 95% 92% 92%  Weight:      Height:        General: Pt is alert, awake, not in acute distress Cardiovascular: RRR, S1/S2 +, no rubs, no gallops Respiratory: CTA bilaterally, no wheezing, no rhonchi Abdominal: Soft, NT, ND, bowel sounds + Extremities: no edema, no cyanosis, left foot dressings C/D/I.    The results of significant diagnostics from this hospitalization (including imaging, microbiology, ancillary and laboratory) are listed below for reference.     Microbiology: Recent Results (from the past 240 hours)  Blood Culture (routine x 2)     Status: None (Preliminary result)   Collection Time: 02/04/24  1:45 PM   Specimen: BLOOD RIGHT ARM  Result Value Ref Range Status   Specimen Description BLOOD RIGHT ARM  Final   Special Requests   Final    BOTTLES DRAWN AEROBIC AND ANAEROBIC Blood Culture results may not be optimal due to an inadequate volume of blood received in culture bottles   Culture   Final    NO GROWTH 2 DAYS Performed at [facility], [address], [place], [place] [address]    Report Status PENDING  Incomplete  Blood Culture (routine x 2)     Status: None (Preliminary result)   Collection Time: 02/04/24  1:51 PM   Specimen: BLOOD LEFT FOREARM  Result Value Ref Range Status   Specimen Description BLOOD LEFT FOREARM  Final   Special Requests   Final    BOTTLES DRAWN AEROBIC ONLY Blood Culture adequate volume   Culture   Final    NO GROWTH 2 DAYS Performed at [facility], [address] [place], [place] [address]    Report Status PENDING  Incomplete  Surgical PCR screen     Status: None   Collection Time: 02/04/24 11:40 PM   Specimen: Nasal Mucosa; Nasal Swab  Result Value Ref Range Status   MRSA, PCR NEGATIVE NEGATIVE Final   Staphylococcus aureus NEGATIVE NEGATIVE Final    Comment: (NOTE) The Xpert SA Assay (FDA approved for NASAL specimens in patients 45 years of age and older), is one component of a  comprehensive surveillance program. It is not intended to diagnose infection nor to guide or monitor treatment. Performed at [facility], [address], [place], [place] [address]  Aerobic/Anaerobic Culture w Gram Stain (surgical/deep wound)     Status: None (Preliminary result)   Collection Time: 02/05/24 11:08 AM   Specimen: Path fluid; Body Fluid  Result Value Ref Range Status   Specimen Description   Final    FLUID Performed at [facility], [address], [place], [place] [address]    Special Requests   Final    NONE Performed at [facility], [address] [place], [place] [address]    Gram Stain NO WBC SEEN NO ORGANISMS SEEN   Final   Culture   Final    NO GROWTH < 24 HOURS Performed at [facility], [address], [place], [place] [address]    Report Status PENDING  Incomplete     Labs: BNP (last 3 results) Recent Labs    06/16/23 1145  BNP 202.0*   Basic Metabolic Panel: Recent Labs  Lab 02/04/24 1353 02/05/24 0506 02/06/24 0446  NA 142 144 141  K 4.4 4.4 4.4  CL 108 112* 110  CO2 22 23 20*  GLUCOSE 92 79 105*  BUN 36* 32* 29*  CREATININE 1.96* 1.92* 1.91*  CALCIUM  8.9 8.5* 8.5*  MG  --   --  2.3   Liver Function Tests: Recent Labs  Lab 02/04/24 1353  AST 28  ALT 37  ALKPHOS 96  BILITOT 0.2  PROT 6.9  ALBUMIN 3.6   No results for input(s): LIPASE, AMYLASE in the last 168 hours. No results for input(s): AMMONIA in the last 168 hours. CBC: Recent Labs  Lab 02/04/24 1353 02/05/24 0506 02/06/24 0446  WBC 7.9 9.0 10.7*  NEUTROABS 4.1  --   --   HGB 10.8* 11.5* 11.7*  HCT 33.5* 35.9* 37.8*  MCV 103.1* 100.8* 102.2*  PLT 100* 165 165   Cardiac Enzymes: No results for input(s): CKTOTAL, CKMB, CKMBINDEX, TROPONINI in the last 168 hours. BNP: Invalid input(s): POCBNP CBG: Recent Labs  Lab 02/05/24 1004 02/05/24 1141 02/05/24 1620 02/05/24 2059 02/06/24 0507  GLUCAP 84 86 257* 154* 94   D-Dimer No  results for input(s): DDIMER in the last 72 hours. Hgb A1c Recent Labs    02/04/24 1353  HGBA1C 7.5*   Lipid Profile No results for input(s): CHOL, HDL, LDLCALC, TRIG, CHOLHDL, LDLDIRECT in the last 72 hours. Thyroid  function studies No results for input(s): TSH, T4TOTAL, T3FREE, THYROIDAB in the last 72 hours.  Invalid input(s): FREET3 Anemia work up No results for input(s): VITAMINB12, FOLATE, FERRITIN, TIBC, IRON, RETICCTPCT in the last 72 hours. Urinalysis    Component Value Date/Time   COLORURINE STRAW (A) 06/15/2023 2308   APPEARANCEUR Clear 12/16/2023 1116   LABSPEC 1.005 06/15/2023 2308   PHURINE 6.0 06/15/2023 2308   GLUCOSEU Negative 12/16/2023 1116   HGBUR NEGATIVE 06/15/2023 2308   BILIRUBINUR Negative 12/16/2023 1116   KETONESUR NEGATIVE 06/15/2023 2308   PROTEINUR 2+ (A) 12/16/2023 1116   PROTEINUR 100 (A) 06/15/2023 2308   UROBILINOGEN 0.2 07/17/2011 2014   NITRITE Negative 12/16/2023 1116   NITRITE NEGATIVE 06/15/2023 2308   LEUKOCYTESUR Negative 12/16/2023 1116   LEUKOCYTESUR NEGATIVE 06/15/2023 2308   Sepsis Labs Recent Labs  Lab 02/04/24 1353 02/05/24 0506 02/06/24 0446  WBC 7.9 9.0 10.7*   Microbiology Recent Results (from the past 240 hours)  Blood Culture (routine x 2)     Status: None (Preliminary result)   Collection Time: 02/04/24  1:45 PM   Specimen: BLOOD RIGHT ARM  Result Value Ref Range Status  Specimen Description BLOOD RIGHT ARM  Final   Special Requests   Final    BOTTLES DRAWN AEROBIC AND ANAEROBIC Blood Culture results may not be optimal due to an inadequate volume of blood received in culture bottles   Culture   Final    NO GROWTH 2 DAYS Performed at [facility], [address] [place], [place] [address]    Report Status PENDING  Incomplete  Blood Culture (routine x 2)     Status: None (Preliminary result)   Collection Time: 02/04/24  1:51 PM   Specimen: BLOOD LEFT FOREARM  Result Value  Ref Range Status   Specimen Description BLOOD LEFT FOREARM  Final   Special Requests   Final    BOTTLES DRAWN AEROBIC ONLY Blood Culture adequate volume   Culture   Final    NO GROWTH 2 DAYS Performed at [facility], [address], [place], [place] [address]    Report Status PENDING  Incomplete  Surgical PCR screen     Status: None   Collection Time: 02/04/24 11:40 PM   Specimen: Nasal Mucosa; Nasal Swab  Result Value Ref Range Status   MRSA, PCR NEGATIVE NEGATIVE Final   Staphylococcus aureus NEGATIVE NEGATIVE Final    Comment: (NOTE) The Xpert SA Assay (FDA approved for NASAL specimens in patients 8 years of age and older), is one component of a comprehensive surveillance program. It is not intended to diagnose infection nor to guide or monitor treatment. Performed at [facility], [address], [place], [place] [address]   Aerobic/Anaerobic Culture w Gram Stain (surgical/deep wound)     Status: None (Preliminary result)   Collection Time: 02/05/24 11:08 AM   Specimen: Path fluid; Body Fluid  Result Value Ref Range Status   Specimen Description   Final    FLUID Performed at [facility], [address], [place], [place] [address]    Special Requests   Final    NONE Performed at [facility], [address] [place], [place] [address]    Gram Stain NO WBC SEEN NO ORGANISMS SEEN   Final   Culture   Final    NO GROWTH < 24 HOURS Performed at [facility], [address] [place], [place] [address]    Report Status PENDING  Incomplete     Time coordinating discharge: 35 minutes  SIGNED:   Adron JONETTA Fairly, DO [facility] 02/06/2024, 9:48 AM  If 7PM-7AM, please contact night-coverage [email or web address]

## 2024-02-06 NOTE — Progress Notes (Signed)
 S: Patient was seen at bedside. S/P left foot debridement of the ulcer and resection of the left third metatarsal. He is doing fine. Pain only when he stands up. Denies any calf pain or chest pain. Slept good through out the night he states.   O:  Alert, awake oriented. NAD.    Left foot: Vascular: DP and PT pulses palpable. Edema is present. Pedal hair growth is absent. Capillary refill time is brisk to lesser toes. Skin discoloration noted on the lower leg with swelling.  Dermatology: Full thickness ulcer noted on the sub 3rd met head. There is mild serous drainage noted. Stiches noted on the dorsal aspect of the left third MPJ. Intact. Dry blood noted consistent with post op. Swelling noted on the dorsal aspect of the left foot consistent with post op.  Musculoskeletol: First ray amputation noted on the left. Crowding of lessed digit noted. Ankle joint ROM limited with knee extended and flexed.  NEUROLOGY: Protective sensation is diminished as tested with 5.07 gram monofilament.    A: Ulcer of the left foot- Stable.  S/p Left foot ulcer debridement and resection of the third metatarsal head. POD#1.  Cellulitis: Improved.   P: Patient examined and evaluated.  Dry sterile dressing on the top and Aquacell Ag+ dressing on the ulcer site on the left foot applied. Keep the dressing clean, dry and intact.  Will NOT need dressing change until he sees me in the office on Monday.  Patient can be discharge on oral Doxycyline for 10 days BID.  Wound cultures pending from the OR. Will follow up with it and change the antibiotics accordingly.  Recommend to stay off his feet and keep the surgical shoe on at all the time except sleeping. Patient stable to discharge from podiatry point of view. Will follow up with me on Monday 02/10/24 at the [place] office.
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
## 2024-02-07 LAB — SURGICAL PATHOLOGY

## 2024-02-09 LAB — CULTURE, BLOOD (ROUTINE X 2)
Culture: NO GROWTH
Culture: NO GROWTH
Special Requests: ADEQUATE

## 2024-02-10 DIAGNOSIS — Z4889 Encounter for other specified surgical aftercare: Secondary | ICD-10-CM | POA: Diagnosis not present

## 2024-02-10 LAB — AEROBIC/ANAEROBIC CULTURE W GRAM STAIN (SURGICAL/DEEP WOUND)
Culture: NORMAL
Gram Stain: NONE SEEN

## 2024-02-13 DIAGNOSIS — N390 Urinary tract infection, site not specified: Secondary | ICD-10-CM | POA: Diagnosis not present

## 2024-02-13 DIAGNOSIS — E11621 Type 2 diabetes mellitus with foot ulcer: Secondary | ICD-10-CM | POA: Diagnosis not present

## 2024-02-13 DIAGNOSIS — N1832 Chronic kidney disease, stage 3b: Secondary | ICD-10-CM | POA: Diagnosis not present

## 2024-02-13 DIAGNOSIS — I10 Essential (primary) hypertension: Secondary | ICD-10-CM | POA: Diagnosis not present

## 2024-02-13 DIAGNOSIS — F3163 Bipolar disorder, current episode mixed, severe, without psychotic features: Secondary | ICD-10-CM | POA: Diagnosis not present

## 2024-02-13 DIAGNOSIS — E1165 Type 2 diabetes mellitus with hyperglycemia: Secondary | ICD-10-CM | POA: Diagnosis not present

## 2024-02-14 DIAGNOSIS — N390 Urinary tract infection, site not specified: Secondary | ICD-10-CM | POA: Diagnosis not present

## 2024-02-17 DIAGNOSIS — Z4889 Encounter for other specified surgical aftercare: Secondary | ICD-10-CM | POA: Diagnosis not present

## 2024-02-19 ENCOUNTER — Ambulatory Visit (HOSPITAL_COMMUNITY)
Admission: RE | Admit: 2024-02-19 | Discharge: 2024-02-19 | Disposition: A | Discharge status: Home / Self Care | Source: Ambulatory Visit | Attending: Gerontology | Admitting: Gerontology

## 2024-02-19 ENCOUNTER — Other Ambulatory Visit (HOSPITAL_COMMUNITY): Payer: Self-pay | Admitting: Gerontology

## 2024-02-19 DIAGNOSIS — M25552 Pain in left hip: Secondary | ICD-10-CM | POA: Insufficient documentation

## 2024-02-19 DIAGNOSIS — J449 Chronic obstructive pulmonary disease, unspecified: Secondary | ICD-10-CM | POA: Diagnosis not present

## 2024-02-19 DIAGNOSIS — I739 Peripheral vascular disease, unspecified: Secondary | ICD-10-CM | POA: Diagnosis not present

## 2024-02-19 DIAGNOSIS — E1142 Type 2 diabetes mellitus with diabetic polyneuropathy: Secondary | ICD-10-CM | POA: Diagnosis not present

## 2024-02-19 DIAGNOSIS — N390 Urinary tract infection, site not specified: Secondary | ICD-10-CM | POA: Diagnosis not present

## 2024-02-23 ENCOUNTER — Emergency Department (HOSPITAL_COMMUNITY)

## 2024-02-23 ENCOUNTER — Inpatient Hospital Stay (HOSPITAL_COMMUNITY)
Admission: EM | Admit: 2024-02-23 | Discharge: 2024-02-25 | DRG: 070 | Disposition: A | Discharge status: Home Health | Source: Skilled Nursing Facility | Attending: Internal Medicine | Admitting: Internal Medicine

## 2024-02-23 ENCOUNTER — Inpatient Hospital Stay (HOSPITAL_COMMUNITY)

## 2024-02-23 ENCOUNTER — Other Ambulatory Visit: Payer: Self-pay

## 2024-02-23 ENCOUNTER — Encounter (HOSPITAL_COMMUNITY): Payer: Self-pay

## 2024-02-23 DIAGNOSIS — E1122 Type 2 diabetes mellitus with diabetic chronic kidney disease: Secondary | ICD-10-CM | POA: Diagnosis present

## 2024-02-23 DIAGNOSIS — I129 Hypertensive chronic kidney disease with stage 1 through stage 4 chronic kidney disease, or unspecified chronic kidney disease: Secondary | ICD-10-CM | POA: Diagnosis present

## 2024-02-23 DIAGNOSIS — R0902 Hypoxemia: Secondary | ICD-10-CM | POA: Diagnosis not present

## 2024-02-23 DIAGNOSIS — J9601 Acute respiratory failure with hypoxia: Secondary | ICD-10-CM | POA: Diagnosis present

## 2024-02-23 DIAGNOSIS — Z87891 Personal history of nicotine dependence: Secondary | ICD-10-CM

## 2024-02-23 DIAGNOSIS — R296 Repeated falls: Secondary | ICD-10-CM | POA: Diagnosis present

## 2024-02-23 DIAGNOSIS — N178 Other acute kidney failure: Secondary | ICD-10-CM

## 2024-02-23 DIAGNOSIS — R2689 Other abnormalities of gait and mobility: Secondary | ICD-10-CM | POA: Diagnosis present

## 2024-02-23 DIAGNOSIS — S3993XA Unspecified injury of pelvis, initial encounter: Secondary | ICD-10-CM | POA: Diagnosis not present

## 2024-02-23 DIAGNOSIS — Z1152 Encounter for screening for COVID-19: Secondary | ICD-10-CM | POA: Diagnosis not present

## 2024-02-23 DIAGNOSIS — Z87820 Personal history of traumatic brain injury: Secondary | ICD-10-CM

## 2024-02-23 DIAGNOSIS — Z89412 Acquired absence of left great toe: Secondary | ICD-10-CM | POA: Diagnosis not present

## 2024-02-23 DIAGNOSIS — F79 Unspecified intellectual disabilities: Secondary | ICD-10-CM | POA: Diagnosis present

## 2024-02-23 DIAGNOSIS — Z8701 Personal history of pneumonia (recurrent): Secondary | ICD-10-CM

## 2024-02-23 DIAGNOSIS — N1832 Chronic kidney disease, stage 3b: Secondary | ICD-10-CM | POA: Diagnosis present

## 2024-02-23 DIAGNOSIS — Z79899 Other long term (current) drug therapy: Secondary | ICD-10-CM | POA: Diagnosis not present

## 2024-02-23 DIAGNOSIS — Z152 Genetic susceptibility to obesity: Secondary | ICD-10-CM | POA: Diagnosis not present

## 2024-02-23 DIAGNOSIS — Z9181 History of falling: Secondary | ICD-10-CM

## 2024-02-23 DIAGNOSIS — R531 Weakness: Secondary | ICD-10-CM | POA: Diagnosis not present

## 2024-02-23 DIAGNOSIS — S299XXA Unspecified injury of thorax, initial encounter: Secondary | ICD-10-CM | POA: Diagnosis not present

## 2024-02-23 DIAGNOSIS — F319 Bipolar disorder, unspecified: Secondary | ICD-10-CM | POA: Diagnosis present

## 2024-02-23 DIAGNOSIS — E11649 Type 2 diabetes mellitus with hypoglycemia without coma: Secondary | ICD-10-CM | POA: Diagnosis present

## 2024-02-23 DIAGNOSIS — I959 Hypotension, unspecified: Secondary | ICD-10-CM | POA: Diagnosis present

## 2024-02-23 DIAGNOSIS — Z89422 Acquired absence of other left toe(s): Secondary | ICD-10-CM

## 2024-02-23 DIAGNOSIS — E782 Mixed hyperlipidemia: Secondary | ICD-10-CM | POA: Diagnosis present

## 2024-02-23 DIAGNOSIS — J449 Chronic obstructive pulmonary disease, unspecified: Secondary | ICD-10-CM | POA: Diagnosis present

## 2024-02-23 DIAGNOSIS — M4187 Other forms of scoliosis, lumbosacral region: Secondary | ICD-10-CM | POA: Diagnosis not present

## 2024-02-23 DIAGNOSIS — N281 Cyst of kidney, acquired: Secondary | ICD-10-CM | POA: Diagnosis not present

## 2024-02-23 DIAGNOSIS — G9341 Metabolic encephalopathy: Principal | ICD-10-CM | POA: Diagnosis present

## 2024-02-23 DIAGNOSIS — M19072 Primary osteoarthritis, left ankle and foot: Secondary | ICD-10-CM | POA: Diagnosis not present

## 2024-02-23 DIAGNOSIS — N179 Acute kidney failure, unspecified: Secondary | ICD-10-CM | POA: Diagnosis present

## 2024-02-23 DIAGNOSIS — J439 Emphysema, unspecified: Secondary | ICD-10-CM | POA: Diagnosis not present

## 2024-02-23 DIAGNOSIS — F17200 Nicotine dependence, unspecified, uncomplicated: Secondary | ICD-10-CM | POA: Diagnosis present

## 2024-02-23 DIAGNOSIS — W19XXXA Unspecified fall, initial encounter: Secondary | ICD-10-CM | POA: Diagnosis not present

## 2024-02-23 DIAGNOSIS — R339 Retention of urine, unspecified: Secondary | ICD-10-CM | POA: Diagnosis present

## 2024-02-23 DIAGNOSIS — M545 Low back pain, unspecified: Secondary | ICD-10-CM | POA: Diagnosis not present

## 2024-02-23 DIAGNOSIS — E1165 Type 2 diabetes mellitus with hyperglycemia: Secondary | ICD-10-CM | POA: Diagnosis not present

## 2024-02-23 DIAGNOSIS — M549 Dorsalgia, unspecified: Secondary | ICD-10-CM | POA: Diagnosis not present

## 2024-02-23 DIAGNOSIS — Z7984 Long term (current) use of oral hypoglycemic drugs: Secondary | ICD-10-CM

## 2024-02-23 DIAGNOSIS — I6523 Occlusion and stenosis of bilateral carotid arteries: Secondary | ICD-10-CM | POA: Diagnosis not present

## 2024-02-23 DIAGNOSIS — Z043 Encounter for examination and observation following other accident: Secondary | ICD-10-CM | POA: Diagnosis not present

## 2024-02-23 DIAGNOSIS — Z800 Family history of malignant neoplasm of digestive organs: Secondary | ICD-10-CM

## 2024-02-23 DIAGNOSIS — R600 Localized edema: Secondary | ICD-10-CM | POA: Diagnosis not present

## 2024-02-23 DIAGNOSIS — J9811 Atelectasis: Secondary | ICD-10-CM | POA: Diagnosis present

## 2024-02-23 DIAGNOSIS — Z794 Long term (current) use of insulin: Secondary | ICD-10-CM | POA: Diagnosis not present

## 2024-02-23 DIAGNOSIS — R7401 Elevation of levels of liver transaminase levels: Secondary | ICD-10-CM | POA: Diagnosis not present

## 2024-02-23 DIAGNOSIS — I10 Essential (primary) hypertension: Secondary | ICD-10-CM | POA: Diagnosis present

## 2024-02-23 DIAGNOSIS — M4803 Spinal stenosis, cervicothoracic region: Secondary | ICD-10-CM | POA: Diagnosis not present

## 2024-02-23 LAB — COMPREHENSIVE METABOLIC PANEL WITH GFR
ALT: 58 U/L — ABNORMAL HIGH (ref 0–44)
AST: 78 U/L — ABNORMAL HIGH (ref 15–41)
Albumin: 3.2 g/dL — ABNORMAL LOW (ref 3.5–5.0)
Alkaline Phosphatase: 89 U/L (ref 38–126)
Anion gap: 9 (ref 5–15)
BUN: 47 mg/dL — ABNORMAL HIGH (ref 8–23)
CO2: 25 mmol/L (ref 22–32)
Calcium: 8.5 mg/dL — ABNORMAL LOW (ref 8.9–10.3)
Chloride: 103 mmol/L (ref 98–111)
Creatinine, Ser: 2.95 mg/dL — ABNORMAL HIGH (ref 0.61–1.24)
GFR, Estimated: 23 mL/min — ABNORMAL LOW (ref >60)
Glucose, Bld: 69 mg/dL — ABNORMAL LOW (ref 70–99)
Potassium: 4.3 mmol/L (ref 3.5–5.1)
Sodium: 136 mmol/L (ref 135–145)
Total Bilirubin: 0.3 mg/dL (ref 0.0–1.2)
Total Protein: 6.4 g/dL — ABNORMAL LOW (ref 6.5–8.1)

## 2024-02-23 LAB — URINALYSIS, W/ REFLEX TO CULTURE (INFECTION SUSPECTED)
Bacteria, UA: NONE SEEN
Bilirubin Urine: NEGATIVE
Glucose, UA: NEGATIVE mg/dL
Hgb urine dipstick: NEGATIVE
Ketones, ur: NEGATIVE mg/dL
Leukocytes,Ua: NEGATIVE
Nitrite: NEGATIVE
Protein, ur: 30 mg/dL — AB
Specific Gravity, Urine: 1.009 (ref 1.005–1.030)
pH: 6 (ref 5.0–8.0)

## 2024-02-23 LAB — CBC WITH DIFFERENTIAL/PLATELET
Abs Immature Granulocytes: 0.03 K/uL (ref 0.00–0.07)
Basophils Absolute: 0 K/uL (ref 0.0–0.1)
Basophils Relative: 0 %
Eosinophils Absolute: 0.1 K/uL (ref 0.0–0.5)
Eosinophils Relative: 1 %
HCT: 33.5 % — ABNORMAL LOW (ref 39.0–52.0)
Hemoglobin: 10.8 g/dL — ABNORMAL LOW (ref 13.0–17.0)
Immature Granulocytes: 0 %
Lymphocytes Relative: 26 %
Lymphs Abs: 2 K/uL (ref 0.7–4.0)
MCH: 32.8 pg (ref 26.0–34.0)
MCHC: 32.2 g/dL (ref 30.0–36.0)
MCV: 101.8 fL — ABNORMAL HIGH (ref 80.0–100.0)
Monocytes Absolute: 0.7 K/uL (ref 0.1–1.0)
Monocytes Relative: 10 %
Neutro Abs: 4.8 K/uL (ref 1.7–7.7)
Neutrophils Relative %: 63 %
Platelets: 199 K/uL (ref 150–400)
RBC: 3.29 MIL/uL — ABNORMAL LOW (ref 4.22–5.81)
RDW: 14.6 % (ref 11.5–15.5)
WBC: 7.6 K/uL (ref 4.0–10.5)
nRBC: 0 % (ref 0.0–0.2)

## 2024-02-23 LAB — URINE DRUG SCREEN
Amphetamines: NEGATIVE
Barbiturates: NEGATIVE
Benzodiazepines: NEGATIVE
Cocaine: NEGATIVE
Fentanyl: NEGATIVE
Methadone Scn, Ur: NEGATIVE
Opiates: NEGATIVE
Tetrahydrocannabinol: NEGATIVE

## 2024-02-23 LAB — TSH: TSH: 1.24 u[IU]/mL (ref 0.350–4.500)

## 2024-02-23 LAB — CBG MONITORING, ED
Glucose-Capillary: 74 mg/dL (ref 70–99)
Glucose-Capillary: 121 mg/dL — ABNORMAL HIGH (ref 70–99)
Glucose-Capillary: 48 mg/dL — ABNORMAL LOW (ref 70–99)

## 2024-02-23 LAB — PRO BRAIN NATRIURETIC PEPTIDE: Pro Brain Natriuretic Peptide: 289 pg/mL (ref <300.0)

## 2024-02-23 LAB — PROTIME-INR
INR: 1.1 (ref 0.8–1.2)
Prothrombin Time: 14.6 s (ref 11.4–15.2)

## 2024-02-23 LAB — RESP PANEL BY RT-PCR (RSV, FLU A&B, COVID)  RVPGX2
Influenza A by PCR: NEGATIVE
Influenza B by PCR: NEGATIVE
Resp Syncytial Virus by PCR: NEGATIVE
SARS Coronavirus 2 by RT PCR: NEGATIVE

## 2024-02-23 LAB — VITAMIN B12: Vitamin B-12: 725 pg/mL (ref 180–914)

## 2024-02-23 LAB — LITHIUM LEVEL: Lithium Lvl: 0.98 mmol/L (ref 0.60–1.20)

## 2024-02-23 LAB — GLUCOSE, CAPILLARY
Glucose-Capillary: 145 mg/dL — ABNORMAL HIGH (ref 70–99)
Glucose-Capillary: 198 mg/dL — ABNORMAL HIGH (ref 70–99)

## 2024-02-23 LAB — ETHANOL: Alcohol, Ethyl (B): <15 mg/dL (ref <15)

## 2024-02-23 LAB — FOLATE: Folate: >20 ng/mL (ref >5.9)

## 2024-02-23 LAB — LACTIC ACID, PLASMA
Lactic Acid, Venous: 1.7 mmol/L (ref 0.5–1.9)
Lactic Acid, Venous: 1.3 mmol/L (ref 0.5–1.9)

## 2024-02-23 LAB — APTT: aPTT: 29 s (ref 24–36)

## 2024-02-23 LAB — PROCALCITONIN: Procalcitonin: 0.31 ng/mL

## 2024-02-23 LAB — VALPROIC ACID LEVEL: Valproic Acid Lvl: 35 ug/mL — ABNORMAL LOW (ref 50–100)

## 2024-02-23 LAB — D-DIMER, QUANTITATIVE: D-Dimer, Quant: 0.81 ug{FEU}/mL — ABNORMAL HIGH (ref 0.00–0.50)

## 2024-02-23 MED ORDER — ONDANSETRON HCL 4 MG PO TABS: 4.0000 mg | ORAL_TABLET | Freq: Four times a day (QID) | ORAL | Status: DC | PRN

## 2024-02-23 MED ORDER — LACTATED RINGERS IV BOLUS
1000.0000 mL | Freq: Once | INTRAVENOUS | Status: AC
  Administered 2024-02-23: 1000 mL via INTRAVENOUS

## 2024-02-23 MED ORDER — VANCOMYCIN HCL IN DEXTROSE 1-5 GM/200ML-% IV SOLN: 1000.0000 mg | Freq: Once | INTRAVENOUS | Status: DC

## 2024-02-23 MED ORDER — OFLOXACIN 0.3 % OP SOLN: 1.0000 [drp] | OPHTHALMIC | Status: DC

## 2024-02-23 MED ORDER — UMECLIDINIUM BROMIDE 62.5 MCG/ACT IN AEPB
1.0000 | INHALATION_SPRAY | Freq: Every day | RESPIRATORY_TRACT | Status: DC
  Administered 2024-02-25: 1 via RESPIRATORY_TRACT
  Filled 2024-02-23: qty 7

## 2024-02-23 MED ORDER — ACETAMINOPHEN 325 MG PO TABS: 650.0000 mg | ORAL_TABLET | Freq: Four times a day (QID) | ORAL | Status: DC | PRN

## 2024-02-23 MED ORDER — SODIUM CHLORIDE 0.9 % IV SOLN
2.0000 g | Freq: Once | INTRAVENOUS | Status: AC
  Administered 2024-02-23: 2 g via INTRAVENOUS
  Filled 2024-02-23: qty 12.5

## 2024-02-23 MED ORDER — INSULIN ASPART 100 UNIT/ML IJ SOLN
0.0000 [IU] | Freq: Three times a day (TID) | INTRAMUSCULAR | Status: DC
  Administered 2024-02-24: 2 [IU] via SUBCUTANEOUS
  Administered 2024-02-24: 3 [IU] via SUBCUTANEOUS
  Administered 2024-02-25 (×2): 1 [IU] via SUBCUTANEOUS
  Filled 2024-02-23 (×4): qty 1

## 2024-02-23 MED ORDER — CHLORHEXIDINE GLUCONATE CLOTH 2 % EX PADS
6.0000 | MEDICATED_PAD | Freq: Every day | CUTANEOUS | Status: DC
  Administered 2024-02-23 – 2024-02-24 (×2): 6 via TOPICAL

## 2024-02-23 MED ORDER — INSULIN ASPART 100 UNIT/ML IJ SOLN
0.0000 [IU] | Freq: Every day | INTRAMUSCULAR | Status: DC
  Administered 2024-02-24: 3 [IU] via SUBCUTANEOUS
  Filled 2024-02-23: qty 1

## 2024-02-23 MED ORDER — METRONIDAZOLE 500 MG/100ML IV SOLN
500.0000 mg | Freq: Once | INTRAVENOUS | Status: AC
  Administered 2024-02-23: 500 mg via INTRAVENOUS
  Filled 2024-02-23: qty 100

## 2024-02-23 MED ORDER — PREDNISOLONE ACETATE 1 % OP SUSP
1.0000 [drp] | Freq: Every day | OPHTHALMIC | Status: DC
  Administered 2024-02-24 – 2024-02-25 (×2): 1 [drp] via OPHTHALMIC
  Filled 2024-02-23: qty 1

## 2024-02-23 MED ORDER — LORATADINE 10 MG PO TABS
10.0000 mg | ORAL_TABLET | Freq: Every day | ORAL | Status: DC
  Administered 2024-02-24 – 2024-02-25 (×2): 10 mg via ORAL
  Filled 2024-02-23 (×2): qty 1

## 2024-02-23 MED ORDER — ZINC SULFATE 220 (50 ZN) MG PO CAPS
220.0000 mg | ORAL_CAPSULE | Freq: Every day | ORAL | Status: DC
  Administered 2024-02-24 – 2024-02-25 (×2): 220 mg via ORAL
  Filled 2024-02-23 (×2): qty 1

## 2024-02-23 MED ORDER — TECHNETIUM TO 99M ALBUMIN AGGREGATED
4.0000 | Freq: Once | INTRAVENOUS | Status: AC | PRN
  Administered 2024-02-23: 4 via INTRAVENOUS

## 2024-02-23 MED ORDER — TAMSULOSIN HCL 0.4 MG PO CAPS
0.4000 mg | ORAL_CAPSULE | Freq: Every day | ORAL | Status: DC
  Administered 2024-02-23 – 2024-02-24 (×2): 0.4 mg via ORAL
  Filled 2024-02-23 (×2): qty 1

## 2024-02-23 MED ORDER — DIVALPROEX SODIUM 250 MG PO DR TAB
500.0000 mg | DELAYED_RELEASE_TABLET | Freq: Two times a day (BID) | ORAL | Status: DC
  Administered 2024-02-23 – 2024-02-25 (×4): 500 mg via ORAL
  Filled 2024-02-23 (×4): qty 2

## 2024-02-23 MED ORDER — BENZTROPINE MESYLATE 1 MG PO TABS
1.0000 mg | ORAL_TABLET | Freq: Two times a day (BID) | ORAL | Status: DC
  Administered 2024-02-23 – 2024-02-25 (×4): 1 mg via ORAL
  Filled 2024-02-23 (×4): qty 1

## 2024-02-23 MED ORDER — LACTATED RINGERS IV SOLN: INTRAVENOUS | Status: DC

## 2024-02-23 MED ORDER — ACETAMINOPHEN 650 MG RE SUPP
650.0000 mg | Freq: Four times a day (QID) | RECTAL | Status: DC | PRN
Start: 2024-02-23 — End: 2024-02-25

## 2024-02-23 MED ORDER — ONDANSETRON HCL 4 MG/2ML IJ SOLN: 4.0000 mg | Freq: Four times a day (QID) | INTRAMUSCULAR | Status: DC | PRN

## 2024-02-23 MED ORDER — GABAPENTIN 100 MG PO CAPS
100.0000 mg | ORAL_CAPSULE | Freq: Every day | ORAL | Status: DC
  Administered 2024-02-23 – 2024-02-24 (×2): 100 mg via ORAL
  Filled 2024-02-23 (×2): qty 1

## 2024-02-23 MED ORDER — FINASTERIDE 5 MG PO TABS
5.0000 mg | ORAL_TABLET | Freq: Every day | ORAL | Status: DC
  Administered 2024-02-24 – 2024-02-25 (×2): 5 mg via ORAL
  Filled 2024-02-23 (×2): qty 1

## 2024-02-23 MED ORDER — HEPARIN SODIUM (PORCINE) 5000 UNIT/ML IJ SOLN
5000.0000 [IU] | Freq: Three times a day (TID) | INTRAMUSCULAR | Status: DC
  Administered 2024-02-23 – 2024-02-25 (×7): 5000 [IU] via SUBCUTANEOUS
  Filled 2024-02-23 (×7): qty 1

## 2024-02-23 MED ORDER — TRAZODONE HCL 50 MG PO TABS
50.0000 mg | ORAL_TABLET | Freq: Every day | ORAL | Status: DC
  Administered 2024-02-23 – 2024-02-24 (×2): 50 mg via ORAL
  Filled 2024-02-23 (×2): qty 1

## 2024-02-23 MED ORDER — ATORVASTATIN CALCIUM 40 MG PO TABS
40.0000 mg | ORAL_TABLET | Freq: Every day | ORAL | Status: DC
  Administered 2024-02-24 – 2024-02-25 (×2): 40 mg via ORAL
  Filled 2024-02-23 (×2): qty 1

## 2024-02-23 MED ORDER — VANCOMYCIN HCL 2000 MG/400ML IV SOLN
2000.0000 mg | Freq: Once | INTRAVENOUS | Status: AC
  Administered 2024-02-23: 2000 mg via INTRAVENOUS
  Filled 2024-02-23: qty 400

## 2024-02-23 MED ORDER — RISPERIDONE 1 MG PO TABS
2.0000 mg | ORAL_TABLET | Freq: Every day | ORAL | Status: DC
Start: 2024-02-24 — End: 2024-02-25
  Administered 2024-02-24 – 2024-02-25 (×2): 2 mg via ORAL
  Filled 2024-02-23 (×2): qty 2

## 2024-02-23 NOTE — ED Notes (Signed)
 Patient transported to CT

## 2024-02-23 NOTE — Consult Note (Addendum)
 WOC Nurse Consult Note: Patient underwent L metatarsal bone excision by Dr. Tobie 10/29 Reason for Consult: L foot and leg wounds  Wound type: 1.  Full thickness L dorsal foot sutured wound post surgery as above 2.  Full thickness plantar diabetic ulcer red dry  3.  Linear Partial thickness L lateral leg likely trauma  Pressure Injury POA: na not pressure  Measurement: see nursing flowsheet  Wound bed: as above, sutures noted in place dorsal foot wound  Drainage (amount, consistency, odor) appear dry  Periwound: heavy callusing to plantar foot wound; chronic discoloration B lower legs appears venous stasis  Dressing procedure/placement/frequency: Paint L dorsal foot sutured wound and L plantar foot wound with Betadine  2 x daily, cover with dry gauze and tape or silicone foam whichever is preferred.  Cleanse L lower leg wound with NS, apply a small piece of Xeroform gauze Soila [id number]) every other day and secure with silicone foam   POC discussed with bedside nurse.  Patient should continue to follow with podiatry as outpatient for ongoing management of wounds.  WOC team will not follow.  Re-consult if further needs arise.   Thank you,    Powell Bar MSN, RN-BC, [employer]

## 2024-02-23 NOTE — Hospital Course (Addendum)
 63 year old male with history hypertension, diabetes mellitus type 2, hyperlipidemia, bipolar disorder, tobacco abuse (quit 1 yr ago), COPD, and TBI presenting from [facility] with mechanical fall.  Apparently the patient was standing using the bathroom when he sustained a fall.   He is somewhat of a difficult historian, but he is able to give basic information.  History is supplemented from review of the medical record and speaking with the EDP.  The patient denies syncope.  He denies hitting his head.  He feels that hisleft foot has been improving without any worsening erythema or edema.  He denies any worsening pain or drainage from his left foot.  Apparently, it was reported that the patient has been having more falls.  It was reported that the patient has been having more gait instability leading to the right.  The patient states that this has been a chronic issue which she relates to some pain.  However, his facility feels that he has been leaning more than usual in the past 24 hours.  He has had more falls. Notably, the patient himself denies any headache, chest pain, shortness breath, coughing, hemoptysis, pain.  Specifically, the patient does not feel that his left lower he has been any worse in terms of swelling or edema or erythema. Notably, the patient had a recent hospital admission from 01/27/2024 to 02/06/2024 for diabetic foot infection.  During that hospitalization, the patient underwent excision of the third metatarsal head on the left by Dr. MYRTIS Blanch.  He was discharged home with 10 additional days of doxycycline .   In the ED, the patient was afebrile .  He was initially hypotensive with a blood pressure of 78/50.  Stable with oxygen  saturation 86% on room air.  WBC 7.6, hemoglobin 10.8, platelet 199.  Sodium 136, potassium 4.3, bicarbonate 25, serum creatinine 2.95.  AST 78, ALT 58, alk phosphatase 89, total bilirubin 0.3.  Lactic acid 1.7>> 1.3 UA 0-5WBC.  UDS was negative.  Alcohol  level  is negative.  CT of the brain was negative for acute findings.  CT cervical spine negative for fracture or subluxation.  Ultrasound of his legs negative for DVT.  Chest x-ray showed right basilar atelectasis.  Left foot x-ray was negative for any acute findings.  Pelvis x-ray was negative for fractures.  The patient was given vancomycin , cefepime , metronidazole .  He was given 2 L LR.  He was admitted for further evaluation and treatment.

## 2024-02-23 NOTE — Plan of Care (Signed)
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
## 2024-02-23 NOTE — ED Notes (Signed)
 Pt provided with 2 (4oz) grape juices due to low sugar. Will recheck in 15 mins.
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
## 2024-02-23 NOTE — ED Triage Notes (Addendum)
 Pt BIB EMS from [place]. Per EMS pt fell moving from wheelchair to bed. Pt saw foot doctor yesterday and was cleared. Pt's left lower leg is swollen, red, and hot to touch. Two toes amputated on left foot few months ago. Facility stated pt has been leaning the past two weeks and that isn't baseline.   Pt is Alert and oriented. 86% on RA. 2L [place] applied. EDP made aware of VS.

## 2024-02-23 NOTE — ED Provider Notes (Signed)
 [place] EMERGENCY DEPARTMENT AT [facility] Provider Note   CSN: [id number] Arrival date & time: 02/23/24  9050     Patient presents with: Fall and Cellulitis   Scott [name] is a 63 y.o. male.  {Add pertinent medical, surgical, social history, OB history to HPI:6044} 63 year old male history of diabetes, COPD not on home oxygen , CKD, bipolar disorder, and BPH who presents to the emergency department after a fall.  History obtained per the patient and per [facility] staff. Had a fall this morning and has been having hip pain since. Was standing up from using the restroom after wards and fell onto his knees. No prolonged downtime. Appeared pale and out of breath and shaking. Has been having falls more consistently. Has been weak on his right side yesterday (last night) around 9pm but the patient states for me that this has been going on for years.  No headache or neck pain.  Not on blood thinners.  Was recently hospitalized and discharged on 10/28 for a diabetic foot wound.  Underwent debridement and was discharged on doxycycline .  Both he and the facility say that the leg has been looking better but per EMS that his red hot and swollen.       Prior to Admission medications   Medication Sig Start Date End Date Taking? Authorizing Provider  amLODipine  (NORVASC ) 2.5 MG tablet Take 2.5 mg by mouth daily. 01/14/24   [provider]  atorvastatin  (LIPITOR) 40 MG tablet Take 40 mg by mouth daily. 07/10/22   [provider]  BD AUTOSHIELD DUO 30G X 5 MM MISC USE FOR ONCE DAILY LANTUS  ADMINISTRATION. 04/09/23   Therisa Benton PARAS, NP  benztropine  (COGENTIN ) 1 MG tablet Take 1 mg by mouth 2 (two) times daily.  11/05/18   [provider]  candesartan (ATACAND) 4 MG tablet Take 4 mg by mouth daily. 01/21/24   [provider]  cetirizine (ZYRTEC) 10 MG chewable tablet Chew 10 mg by mouth daily.    [provider]  divalproex  (DEPAKOTE ) 500 MG  DR tablet Take 500-1,000 mg by mouth 2 (two) times daily. Take 1 tab am and 2 tabs pm    [provider]  finasteride  (PROSCAR ) 5 MG tablet Take 1 tablet (5 mg total) by mouth daily. 12/16/23   [name], Belvie CROME, MD  furosemide  (LASIX ) 20 MG tablet Take 1 tablet (20 mg total) by mouth daily. 04/02/23   Akula, Vijaya, MD  gabapentin  (NEURONTIN ) 100 MG capsule Take 100 mg by mouth at bedtime.    [provider]  glucose blood (EASYMAX TEST) test strip USE AS DIRECTED TO CHECK BLOOD SUGAR FOUR DAILY. 10/29/23   Therisa Benton PARAS, NP  INCRUSE ELLIPTA  62.5 MCG/ACT AEPB Inhale 1 puff into the lungs daily. 01/13/24   [provider]  insulin  aspart (NOVOLOG  FLEXPEN) 100 UNIT/ML FlexPen INJECT 5 UNITS SUBCUTANEOUSLY THREE TIMES DAILY W/ MEALS IF BGIS 90-150 & EATING. HOLD INSULIN  IF BG <90, ADD SSI IF >400. ADD SLIDING SCALE FOR BG: 151-200=1U; 201-250=2U; 251-300=3U; 301-350=4U; 351-400=5U; >400=6U & CALL MD. 01/23/24   Therisa Benton PARAS, NP  Insulin  Glargine (BASAGLAR  KWIKPEN) 100 UNIT/ML Inject 60 Units into the skin at bedtime. 08/21/23   Therisa Benton PARAS, NP  Insulin  Pen Needle (DROPSAFE SAFETY PEN NEEDLES) 31G X 6 MM MISC Use to inject insulin  4 times daily 08/21/23   Therisa Benton PARAS, NP  Insulin  Pen Needle 31G X 5 MM MISC Use with insulin  pen to dispense insulin   as directed 06/18/23   Tat, Alm, MD  ketorolac  (ACULAR ) 0.4 % SOLN Place 1 drop into the right eye See admin instructions. 4 times daily starting 1 day before surgery 01/30/24   [provider]  Lancets MISC 1 each by Does not apply route 4 (four) times daily. 10/29/23   Therisa Benton PARAS, NP  lithium  carbonate 300 MG capsule Take 300 mg by mouth in the morning and at bedtime.    [provider]  loratadine  (CLARITIN ) 10 MG tablet Take 10 mg by mouth daily.    [provider]  Melatonin 3 MG SUBL Place 3 mg under the tongue at bedtime.    [provider]  metoprolol  tartrate  (LOPRESSOR ) 25 MG tablet Take 0.5 tablets (12.5 mg total) by mouth 2 (two) times daily. Patient taking differently: Take 25 mg by mouth 2 (two) times daily. 05/03/15   [name], Jolanta B, MD  Multiple Vitamin (DAILY-VITE) TABS Take 1 tablet by mouth daily. 03/04/23   [provider]  nystatin powder Apply 1 Application topically 3 (three) times daily. Apply to groin area three times daily    [provider]  ofloxacin (OCUFLOX) 0.3 % ophthalmic solution Place 1 drop into the right eye See admin instructions. Instill 1 drop into right eye 4 times daily starting 1 day before surgery 01/30/24   [provider]  prednisoLONE acetate (PRED FORTE) 1 % ophthalmic suspension Place 1 drop into the right eye See admin instructions. Instill 1 drop into right eye 4 times daily for 1 week, then 3 times daily for 1 week, twice daily for 1 week then daily for 1 week then stop 01/08/24   [provider]  REFRESH OPTIVE MEGA-3 0.5-1-0.5 % SOLN  01/23/24   [provider]  risperiDONE  (RISPERDAL ) 2 MG tablet Take 1 tablet (2 mg total) by mouth daily. 02/15/23   Johnson, Clanford L, MD  silodosin  (RAPAFLO ) 8 MG CAPS capsule Take 1 capsule (8 mg total) by mouth at bedtime. 12/16/23   [name], Belvie CROME, MD  traMADol  (ULTRAM ) 50 MG tablet Take 1 tablet (50 mg total) by mouth every 6 (six) hours as needed for moderate pain (pain score 4-6) or severe pain (pain score 7-10). 02/06/24   Maree, Pratik D, DO  traZODone  (DESYREL ) 50 MG tablet Take 50 mg by mouth at bedtime.    [provider]  Zinc  Sulfate 220 (50 Zn) MG TABS Take 1 tablet by mouth daily. 03/04/23   [provider]    Allergies: Patient has no known allergies.    Review of Systems  Updated Vital Signs BP (!) 84/61   Pulse 77   Temp 98.5 F (36.9 C) (Oral)   Ht 6' 2 (1.88 m)   SpO2 (!) 86%   BMI 27.91 kg/m   Physical Exam Vitals and nursing note reviewed.  Constitutional:      General:  He is not in acute distress.    Appearance: He is well-developed.  HENT:     Head: Normocephalic and atraumatic.     Right Ear: External ear normal.     Left Ear: External ear normal.     Nose: Nose normal.  Eyes:     Extraocular Movements: Extraocular movements intact.     Conjunctiva/sclera: Conjunctivae normal.     Pupils: Pupils are equal, round, and reactive to light.  Neck:     Comments: No C-spine midline tenderness to palpation Cardiovascular:     Rate and Rhythm: Normal  rate and regular rhythm.     Heart sounds: Normal heart sounds.  Pulmonary:     Effort: Pulmonary effort is normal. No respiratory distress.     Breath sounds: Rales (Bibasilar) present.  Musculoskeletal:     Cervical back: Normal range of motion and neck supple.     Comments: No significant tenderness palpation of either hip or knees.  No deformities of the knees.  See image below for left foot.  Small amount of erythema.  No crepitance or subcutaneous emphysema.  No discharge from wounds of his foot.  Bilateral lower extremities with 2+ edema  Neurological:     Mental Status: He is alert.     Comments: NIHSS Exam  Level of Consciousness: Alert  LOC Questions: Answers Month and Age Correctly  LOC Commands: Opens and Closes Eyes and Hands on command  Best Gaze: Horizontal ocular movements intact  Visual Fields: No visual field loss  Facial Palsy: None  L Upper Extremity Motor: No drift after 10 seconds  R Upper Extremity Motor: Drift but does not hit stretcher L Lower extremity Motor: No drift after 5 seconds  R Lower extremity Motor: No drift after 5 seconds  Ataxia: Absent*** Sensory: Intact sensation to light touch on face, arms, trunk, and legs bilaterally  Best Language: No aphasia  Dysarthria: Mild dysarthria but unclear if this is Neglect: No visual or sensory neglect     Psychiatric:        Mood and Affect: Mood normal.        Behavior: Behavior normal.     (all labs ordered are  listed, but only abnormal results are displayed) Labs Reviewed  CULTURE, BLOOD (ROUTINE X 2)  CULTURE, BLOOD (ROUTINE X 2)  COMPREHENSIVE METABOLIC PANEL WITH GFR  LACTIC ACID, PLASMA  LACTIC ACID, PLASMA  CBC WITH DIFFERENTIAL/PLATELET  PROTIME-INR  URINALYSIS, W/ REFLEX TO CULTURE (INFECTION SUSPECTED)    EKG: None  Radiology: No results found.  {Document cardiac monitor, telemetry assessment procedure when appropriate:32947} Procedures   Medications Ordered in the ED - No data to display    {Click here for ABCD2, HEART and other calculators REFRESH Note before signing:1}                              Medical Decision Making Amount and/or Complexity of Data Reviewed Labs: ordered. Radiology: ordered.  Risk Prescription drug management.   ***  {Document critical care time when appropriate  Document review of labs and clinical decision tools ie CHADS2VASC2, etc  Document your independent review of radiology images and any outside records  Document your discussion with family members, caretakers and with consultants  Document social determinants of health affecting pt's care  Document your decision making why or why not admission, treatments were needed:32947:::1}   Final diagnoses:  None    ED Discharge Orders     None

## 2024-02-23 NOTE — ED Notes (Signed)
 Called [facility] and updated the nursing team about pts being admitted. Care team verbalized understanding.

## 2024-02-23 NOTE — ED Notes (Addendum)
 Placed on 2L [place] for sat of 87%

## 2024-02-24 ENCOUNTER — Inpatient Hospital Stay (HOSPITAL_COMMUNITY)

## 2024-02-24 DIAGNOSIS — N179 Acute kidney failure, unspecified: Secondary | ICD-10-CM | POA: Diagnosis not present

## 2024-02-24 DIAGNOSIS — G9341 Metabolic encephalopathy: Secondary | ICD-10-CM | POA: Diagnosis not present

## 2024-02-24 DIAGNOSIS — N281 Cyst of kidney, acquired: Secondary | ICD-10-CM | POA: Diagnosis not present

## 2024-02-24 DIAGNOSIS — N1832 Chronic kidney disease, stage 3b: Secondary | ICD-10-CM | POA: Diagnosis not present

## 2024-02-24 DIAGNOSIS — F17200 Nicotine dependence, unspecified, uncomplicated: Secondary | ICD-10-CM | POA: Diagnosis not present

## 2024-02-24 DIAGNOSIS — R7401 Elevation of levels of liver transaminase levels: Secondary | ICD-10-CM | POA: Diagnosis not present

## 2024-02-24 DIAGNOSIS — N178 Other acute kidney failure: Secondary | ICD-10-CM | POA: Diagnosis not present

## 2024-02-24 LAB — RESPIRATORY PANEL BY PCR

## 2024-02-24 LAB — BASIC METABOLIC PANEL WITH GFR
Anion gap: 9 (ref 5–15)
BUN: 40 mg/dL — ABNORMAL HIGH (ref 8–23)
CO2: 23 mmol/L (ref 22–32)
Calcium: 8.2 mg/dL — ABNORMAL LOW (ref 8.9–10.3)
Chloride: 109 mmol/L (ref 98–111)
Creatinine, Ser: 2.37 mg/dL — ABNORMAL HIGH (ref 0.61–1.24)
GFR, Estimated: 30 mL/min — ABNORMAL LOW (ref >60)
Glucose, Bld: 53 mg/dL — ABNORMAL LOW (ref 70–99)
Potassium: 4.6 mmol/L (ref 3.5–5.1)
Sodium: 140 mmol/L (ref 135–145)

## 2024-02-24 LAB — GLUCOSE, CAPILLARY
Glucose-Capillary: 71 mg/dL (ref 70–99)
Glucose-Capillary: 189 mg/dL — ABNORMAL HIGH (ref 70–99)
Glucose-Capillary: 221 mg/dL — ABNORMAL HIGH (ref 70–99)
Glucose-Capillary: 289 mg/dL — ABNORMAL HIGH (ref 70–99)

## 2024-02-24 LAB — CBC
HCT: 34.7 % — ABNORMAL LOW (ref 39.0–52.0)
Hemoglobin: 10.8 g/dL — ABNORMAL LOW (ref 13.0–17.0)
MCH: 31.9 pg (ref 26.0–34.0)
MCHC: 31.1 g/dL (ref 30.0–36.0)
MCV: 102.4 fL — ABNORMAL HIGH (ref 80.0–100.0)
Platelets: 185 K/uL (ref 150–400)
RBC: 3.39 MIL/uL — ABNORMAL LOW (ref 4.22–5.81)
RDW: 14.8 % (ref 11.5–15.5)
WBC: 7.2 K/uL (ref 4.0–10.5)
nRBC: 0 % (ref 0.0–0.2)

## 2024-02-24 LAB — HEPATITIS PANEL, ACUTE
HCV Ab: NONREACTIVE
Hep A IgM: NONREACTIVE
Hep B C IgM: NONREACTIVE
Hepatitis B Surface Ag: NONREACTIVE

## 2024-02-24 LAB — HEMOGLOBIN A1C
Hgb A1c MFr Bld: 7.1 % — ABNORMAL HIGH (ref 4.8–5.6)
Mean Plasma Glucose: 157.07 mg/dL

## 2024-02-24 LAB — CORTISOL-AM, BLOOD: Cortisol - AM: 8.5 ug/dL (ref 6.7–22.6)

## 2024-02-24 LAB — T4, FREE: Free T4: 0.67 ng/dL (ref 0.61–1.12)

## 2024-02-24 MED ORDER — LACTATED RINGERS IV SOLN: INTRAVENOUS | Status: DC

## 2024-02-24 MED ORDER — LITHIUM CARBONATE 150 MG PO CAPS
300.0000 mg | ORAL_CAPSULE | Freq: Two times a day (BID) | ORAL | Status: DC
  Administered 2024-02-24 – 2024-02-25 (×3): 300 mg via ORAL
  Filled 2024-02-24 (×3): qty 2
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
## 2024-02-24 NOTE — Discharge Summary (Incomplete)
 Physician Discharge Summary   Patient: Scott [name] MRN: [id number] DOB: 1960-04-15  Admit date:     02/23/2024  Discharge date: 02/25/2024  Discharge Physician: Alm Seriah Brotzman   PCP: Carlette Benita Area, MD   Recommendations at discharge:   Please follow up with primary care provider within 1-2 weeks  Please repeat BMP and CBC in one week     Hospital Course: 63 year old male with history hypertension, diabetes mellitus type 2, hyperlipidemia, bipolar disorder, tobacco abuse (quit 1 yr ago), COPD, and TBI presenting from [facility] with mechanical fall.  Apparently the patient was standing using the bathroom when he sustained a fall.   He is somewhat of a difficult historian, but he is able to give basic information.  History is supplemented from review of the medical record and speaking with the EDP.  The patient denies syncope.  He denies hitting his head.  He feels that hisleft foot has been improving without any worsening erythema or edema.  He denies any worsening pain or drainage from his left foot.  Apparently, it was reported that the patient has been having more falls.  It was reported that the patient has been having more gait instability leading to the right.  The patient states that this has been a chronic issue which she relates to some pain.  However, his facility feels that he has been leaning more than usual in the past 24 hours.  He has had more falls. Notably, the patient himself denies any headache, chest pain, shortness breath, coughing, hemoptysis, pain.  Specifically, the patient does not feel that his left lower he has been any worse in terms of swelling or edema or erythema. Notably, the patient had a recent hospital admission from 01/27/2024 to 02/06/2024 for diabetic foot infection.  During that hospitalization, the patient underwent excision of the third metatarsal head on the left by Dr. MYRTIS Blanch.  He was discharged home with 10 additional days of  doxycycline .   In the ED, the patient was afebrile .  He was initially hypotensive with a blood pressure of 78/50.  Stable with oxygen  saturation 86% on room air.  WBC 7.6, hemoglobin 10.8, platelet 199.  Sodium 136, potassium 4.3, bicarbonate 25, serum creatinine 2.95.  AST 78, ALT 58, alk phosphatase 89, total bilirubin 0.3.  Lactic acid 1.7>> 1.3 UA 0-5WBC.  UDS was negative.  Alcohol  level is negative.  CT of the brain was negative for acute findings.  CT cervical spine negative for fracture or subluxation.  Ultrasound of his legs negative for DVT.  Chest x-ray showed right basilar atelectasis.  Left foot x-ray was negative for any acute findings.  Pelvis x-ray was negative for fractures.  The patient was given vancomycin , cefepime , metronidazole .  He was given 2 L LR.  He was admitted for further evaluation and treatment.  Assessment and Plan: Acute respiratory failure with hypoxia - Presented with oxygenation 86% on room air - likely due to hypoventilation from altered mental status - CT chest--no consolidation or edema - VQ scan--neg for PE - Personally reviewed chest x-ray--no consolidations or edema - Stable on 2 L - now stable on RA   Acute metabolic encephalopathy - Multifactorial including acute on chronic renal failure, possible elevated lithium  level, possible infectious process - B12--725 -TSH--1.240 -folic acid >20 - Lithium  level--0.98 (WNL) - VPA level--35 - UA negative for pyuria - CT brain negative for acute findings - No focal neurologic deficits on physical examination - 11/17--mental status back to  baseline   COPD -Started DuoNebs -COVID-19 PCR negative/influenza--NEG -Check viral respiratory panel--pending -stable on RA   LEFT lower extremity edema -Good palpable pulses -Venous duplex negative for DVT -I have compared his current physical exam with photographs dated back on 06/16/2023 and 06/17/2023>> -As such, his exam and comparison with photographs  from 06/2023 do not suggest active or worsening infection   Acute on chronic renal failure CKD 3b -Baseline creatinine 1.6-1.9 -Presented with serum creatinine 2.95 -Continue IV fluids>>improving -Holding furosemide >>restart after dc - Check lithium  levels--0.98 -In part due to obstruction secondary to urinary retention and recent Bactrim  DS -Foley catheter placed in the ED with over 1 L out -11/18 foley removed>>pt able to urinate -11/17 renal US --no hydronephrosis -serum creatinine 2.02 on day of dc   Uncontrolled diabetes mellitus type 2 with hyperglycemia -NovoLog  sliding scale -02/04/2024 hemoglobin A1c 7.5 -Holding glipizide  and tradjenta  -holding semglee  due to hypoglycemia at time of admission - no further hypoglycemia - decrease outpt basaglar  from 60 units to 20 units daily - check CBGs ac/hs and follow up with PCP for further adjustments   Essential hypertension -Hold amlodipine  due to hypotension -hold metoprolol  due to hypotension -monitor clinically>>BP remains well controlled -d/c Atacand and amlodipine  -restart low dose metoprolol  after dc   Mixed hyperlipidemia -Continue statin   Bipolar disorder -Continue valproic acid ,  benztropine , respiradone - Holding lithium  temporarily>>restart         Consultants: none Procedures performed: none  Disposition: Assisted living Diet recommendation:  Carb modified diet DISCHARGE MEDICATION: Allergies as of 02/25/2024   No Known Allergies      Medication List     STOP taking these medications    amLODipine  2.5 MG tablet Commonly known as: NORVASC    ascorbic acid  500 MG tablet Commonly known as: VITAMIN C    candesartan 4 MG tablet Commonly known as: ATACAND   doxycycline  100 MG tablet Commonly known as: ADOXA   ofloxacin 0.3 % ophthalmic solution Commonly known as: OCUFLOX   sulfamethoxazole -trimethoprim  800-160 MG tablet Commonly known as: BACTRIM  DS       TAKE these medications     acetaminophen  500 MG tablet Commonly known as: TYLENOL  Take 500 mg by mouth 3 (three) times daily as needed for mild pain (pain score 1-3).   albuterol  108 (90 Base) MCG/ACT inhaler Commonly known as: VENTOLIN  HFA Inhale 2 puffs into the lungs every 4 (four) hours as needed for wheezing or shortness of breath.   atorvastatin  40 MG tablet Commonly known as: LIPITOR Take 40 mg by mouth daily.   Basaglar  KwikPen 100 UNIT/ML Inject 30 Units into the skin at bedtime. What changed: how much to take   BD AutoShield Duo 30G X 5 MM Misc Generic drug: Insulin  Pen Needle USE FOR ONCE DAILY LANTUS  ADMINISTRATION.   Insulin  Pen Needle 31G X 5 MM Misc Use with insulin  pen to dispense insulin  as directed   DropSafe Safety Pen Needles 31G X 6 MM Misc Generic drug: Insulin  Pen Needle Use to inject insulin  4 times daily   benztropine  1 MG tablet Commonly known as: COGENTIN  Take 1 mg by mouth 2 (two) times daily.   cetirizine 10 MG tablet Commonly known as: ZYRTEC Take 10 mg by mouth daily.   Daily-Vite Tabs Take 1 tablet by mouth daily.   divalproex  500 MG DR tablet Commonly known as: DEPAKOTE  Take 500-1,000 mg by mouth 2 (two) times daily. Take 1 tab am and 2 tabs pm   EasyMax Test test strip Generic drug:  glucose blood USE AS DIRECTED TO CHECK BLOOD SUGAR FOUR DAILY.   finasteride  5 MG tablet Commonly known as: PROSCAR  Take 1 tablet (5 mg total) by mouth daily.   furosemide  20 MG tablet Commonly known as: LASIX  Take 1 tablet (20 mg total) by mouth daily.   gabapentin  100 MG capsule Commonly known as: NEURONTIN  Take 100 mg by mouth at bedtime.   hydrOXYzine 25 MG tablet Commonly known as: ATARAX Take 25 mg by mouth every 8 (eight) hours as needed for anxiety (agitation).   Incruse Ellipta  62.5 MCG/ACT Aepb Generic drug: umeclidinium bromide  Inhale 1 puff into the lungs daily.   ipratropium-albuterol  0.5-2.5 (3) MG/3ML Soln Commonly known as: DUONEB Take 3 mLs by  nebulization every 4 (four) hours as needed (wheezing/shortness of breath).   ketorolac  0.4 % Soln Commonly known as: ACULAR  Place 1 drop into the right eye See admin instructions. 4 times daily starting 1 day before surgery   Lancets Misc 1 each by Does not apply route 4 (four) times daily.   lithium  300 MG tablet Take 300 mg by mouth 2 (two) times daily.   loratadine  10 MG tablet Commonly known as: CLARITIN  Take 10 mg by mouth daily.   Melatonin 3 MG Subl Place 3 mg under the tongue at bedtime.   metoprolol  tartrate 25 MG tablet Commonly known as: LOPRESSOR  Take 0.5 tablets (12.5 mg total) by mouth 2 (two) times daily.   mupirocin  ointment 2 % Commonly known as: BACTROBAN  Apply 1 Application topically 2 (two) times daily. Cuts, scrapes, and after a biopsy   NovoLOG  FlexPen 100 UNIT/ML FlexPen Generic drug: insulin  aspart INJECT 5 UNITS SUBCUTANEOUSLY THREE TIMES DAILY W/ MEALS IF BGIS 90-150 & EATING. HOLD INSULIN  IF BG <90, ADD SSI IF >400. ADD SLIDING SCALE FOR BG: 151-200=1U; 201-250=2U; 251-300=3U; 301-350=4U; 351-400=5U; >400=6U & CALL MD. What changed:  how much to take how to take this when to take this   nystatin powder Apply 1 Application topically 3 (three) times daily. Apply to groin area three times daily   prednisoLONE acetate 1 % ophthalmic suspension Commonly known as: PRED FORTE Place 1 drop into the right eye daily. Instill 1 drop into right eye 4 times daily for 1 week, then 3 times daily for 1 week, twice daily for 1 week then daily for 1 week then stop   Refresh Optive Mega-3 0.5-1-0.5 % Soln Generic drug: Carboxymeth-Glyc-Polysorb PF Place 1 drop into both eyes daily. Starting 5 days prior to surgery   risperiDONE  2 MG tablet Commonly known as: RISPERDAL  Take 1 tablet (2 mg total) by mouth daily.   silodosin  8 MG Caps capsule Commonly known as: RAPAFLO  Take 1 capsule (8 mg total) by mouth at bedtime.   traMADol  50 MG tablet Commonly known  as: ULTRAM  Take 1 tablet (50 mg total) by mouth every 6 (six) hours as needed for moderate pain (pain score 4-6) or severe pain (pain score 7-10). What changed: when to take this   traZODone  50 MG tablet Commonly known as: DESYREL  Take 50 mg by mouth at bedtime.   Zinc  Sulfate 220 (50 Zn) MG Tabs Take 1 tablet by mouth daily.        Discharge Exam: Filed Weights   02/23/24 1708  Weight: 97.6 kg   HEENT:  [place]/AT, No thrush, no icterus CV:  RRR, no rub, no S3, no S4 Lung:  CTA, no wheeze, no rhonchi Abd:  soft/+BS, NT Ext:  1 + LE edema, no lymphangitis, no  synovitis, no rash   Condition at discharge: stable  The results of significant diagnostics from this hospitalization (including imaging, microbiology, ancillary and laboratory) are listed below for reference.   Imaging Studies: US  Abdomen Limited RUQ (LIVER/GB) Result Date: 02/25/2024 EXAM: Right Upper Quadrant Abdominal Ultrasound 02/24/2024 12:31:56 PM TECHNIQUE: Real-time ultrasonography of the right upper quadrant of the abdomen was performed. COMPARISON: US  Abdomen 02/04/2015. CLINICAL HISTORY: Transaminasemia. FINDINGS: LIVER: The liver demonstrates normal echogenicity. No intrahepatic biliary ductal dilatation. No evidence of mass. BILIARY SYSTEM: Gallbladder wall thickness measures 2 mm. No pericholecystic fluid. No cholelithiasis. Negative sonographic Murphy's sign. Common bile duct is within normal limits measuring . OTHER: No right upper quadrant ascites. IMPRESSION: 1. No acute findings in the right upper quadrant Electronically signed by: Dorethia Molt MD 02/25/2024 12:53 AM EST RP Workstation: [id number]   US  RENAL Result Date: 02/25/2024 EXAM: US  Retroperitoneum Complete, Renal. CLINICAL HISTORY: Acute kidney injury. TECHNIQUE: Real-time ultrasound of the retroperitoneum (complete) with image documentation. COMPARISON: US  Renal 09/10/2023. FINDINGS: RIGHT KIDNEY: Measures 12.3 x 6.9 x 6.1 cm. Simple renal cyst.  No hydronephrosis, renal stone, or mass visualized. LEFT KIDNEY: Measures 12.6 x 6.8 x 4.7 cm. Simple renal cyst. No hydronephrosis, renal stone, or mass visualized. BLADDER: Urinary bladder lumen decompressed with foley catheter balloon within its lumen. IMPRESSION: 1. No acute findings. Electronically signed by: Morgane Naveau MD 02/25/2024 12:03 AM EST RP Workstation: [id number]   CT CHEST WO CONTRAST Result Date: 02/23/2024 EXAM: CT CHEST WITHOUT CONTRAST 02/23/2024 08:03:14 PM TECHNIQUE: CT of the chest was performed without the administration of intravenous contrast. Multiplanar reformatted images are provided for review. Automated exposure control, iterative reconstruction, and/or weight based adjustment of the mA/kV was utilized to reduce the radiation dose to as low as reasonably achievable. COMPARISON: 09/11/2023. CLINICAL HISTORY: Respiratory illness, nondiagnostic xray. FINDINGS: MEDIASTINUM: Heart demonstrates coronary artery calcifications. Pericardium is unremarkable. The central airways are clear. LYMPH NODES: No mediastinal, hilar or axillary lymphadenopathy. LUNGS AND PLEURA: Linear dependent bibasilar subsegmental atelectasis or scarring. Emphysematous scarring right apex. No focal consolidation or pulmonary edema. No pleural effusion or pneumothorax. SOFT TISSUES/BONES: No acute abnormality of the bones or soft tissues. UPPER ABDOMEN: Limited images of the upper abdomen demonstrates no acute abnormality. IMPRESSION: 1. No acute findings 2. Emphysema present; for patients 62 years old, consider evaluation for low-dose CT lung cancer screening program given emphysema as an independent risk factor Electronically signed by: Fonda Field MD 02/23/2024 08:18 PM EST RP Workstation: [id number]   NM Pulmonary Perfusion Result Date: 02/23/2024 CLINICAL DATA:  Possible pulmonary embolism. Recent fall with right-sided weakness. EXAM: NUCLEAR MEDICINE PERFUSION LUNG SCAN TECHNIQUE: Perfusion  images were obtained in multiple projections after intravenous injection of radiopharmaceutical. Ventilation scans intentionally deferred if perfusion scan and chest x-ray adequate for interpretation during COVID 19 epidemic. RADIOPHARMACEUTICALS:  4.0 mCi Tc-36m MAA IV COMPARISON:  Chest x-ray earlier today. FINDINGS: Examination demonstrates no focal peripheral wedge-shaped perfusion defects within either lung. Corresponding chest radiograph demonstrates minimal linear density right base likely atelectasis. Lungs otherwise clear. IMPRESSION: Normal perfusion lung scan without evidence of pulmonary emboli. Electronically Signed   By: Toribio Agreste M.D.   On: 02/23/2024 17:12   MR BRAIN WO CONTRAST Result Date: 02/23/2024 EXAM: MRI BRAIN WITHOUT CONTRAST 02/23/2024 03:54:55 PM TECHNIQUE: Multiplanar multisequence MRI of the head/brain was performed without the administration of intravenous contrast. COMPARISON: CT head from the same day. CLINICAL HISTORY: R sided weakness FINDINGS: BRAIN AND VENTRICLES: No acute infarct. No intracranial hemorrhage. No mass.  No midline shift. No hydrocephalus. The sella is unremarkable. Normal flow voids. ORBITS: No acute abnormality. SINUSES AND MASTOIDS: No acute abnormality. BONES AND SOFT TISSUES: Normal marrow signal. No acute soft tissue abnormality. IMPRESSION: 1. No acute intracranial abnormality. Electronically signed by: Gilmore Molt MD 02/23/2024 05:00 PM EST RP Workstation: [id number]   MR FOOT LEFT WO CONTRAST Result Date: 02/23/2024 CLINICAL DATA:  Recent amputation for osteomyelitis. EXAM: MRI OF THE LEFT FOOT WITHOUT CONTRAST TECHNIQUE: Multiplanar, multisequence MR imaging of the left forefoot was performed. No intravenous contrast was administered. COMPARISON:  Radiographs 02/23/2024 and 02/04/2024. MRI 02/04/2024 and 03/25/2023. FINDINGS: Bones/Joint/Cartilage Since the most recent prior MRI, the patient has undergone amputation through the distal 3rd  metatarsal shaft. The phalanges of the 3rd toe remain. The amputation margin is sharp with mild nonspecific marrow edema in the distal metatarsal shaft. No progressive bone destruction identified. There is new mild T2 hyperintensity within the 4th metatarsal head without cortical destruction or T1 signal abnormality. Unchanged posterior dislocation at the 2nd metatarsophalangeal joint compared with the recent prior MRI. Unchanged postsurgical changes from previous amputation at the 1st tarsometatarsal joint. Stable mild chronic midfoot degenerative changes. No significant joint effusions. Ligaments Intact Lisfranc ligament. The collateral ligaments of the 4th and 5th metatarsophalangeal joints appear intact. Muscles and Tendons Generalized forefoot muscular atrophy. The forefoot tendons appear intact, without significant tenosynovitis. Soft tissues Residual small focus of skin ulceration along the plantar aspect of the resected 3rd metatarsal head. Underlying subcutaneous inflammation without residual focal fluid collection. No apparent skin ulceration over the 4th metatarsal head. Areas of low signal dorsal to the resected 3rd metatarsal head are likely postsurgical. No organized fluid collections are identified. IMPRESSION: 1. Interval amputation through the distal 3rd metatarsal shaft with mild nonspecific marrow edema in the distal metatarsal shaft. No progressive bone destruction identified. 2. New mild T2 hyperintensity within the 4th metatarsal head without cortical destruction or T1 signal abnormality. This is nonspecific and could be reactive or secondary to early osteomyelitis. 3. Residual small focus of skin ulceration along the plantar aspect of the resected 3rd metatarsal head with underlying subcutaneous inflammation. No residual focal fluid collection. 4. Unchanged posterior dislocation at the 2nd metatarsophalangeal joint. Electronically Signed   By: Elsie Perone M.D.   On: 02/23/2024 16:49   CT  Head Wo Contrast Result Date: 02/23/2024 EXAM: CT HEAD WITHOUT CONTRAST 02/23/2024 12:34:06 PM TECHNIQUE: CT of the head was performed without the administration of intravenous contrast. Automated exposure control, iterative reconstruction, and/or weight based adjustment of the mA/kV was utilized to reduce the radiation dose to as low as reasonably achievable. COMPARISON: CT head without contrast 11/22/2019. CLINICAL HISTORY: fall, R sided weakness FINDINGS: BRAIN AND VENTRICLES: No acute hemorrhage. No evidence of acute infarct. No hydrocephalus. No extra-axial collection. No mass effect or midline shift. ORBITS: No acute abnormality. SINUSES: No acute abnormality. SOFT TISSUES AND SKULL: No acute soft tissue abnormality. No skull fracture. IMPRESSION: 1. No acute intracranial abnormality. Electronically signed by: Lonni Necessary MD 02/23/2024 12:44 PM EST RP Workstation: [id number]   CT Cervical Spine Wo Contrast Result Date: 02/23/2024 CLINICAL DATA:  Status post fall.  Right-sided weakness. EXAM: CT CERVICAL SPINE WITHOUT CONTRAST TECHNIQUE: Multidetector CT imaging of the cervical spine was performed without intravenous contrast. Multiplanar CT image reconstructions were also generated. RADIATION DOSE REDUCTION: This exam was performed according to the departmental dose-optimization program which includes automated exposure control, adjustment of the mA and/or kV according to patient size and/or use of iterative  reconstruction technique. COMPARISON:  CT cervical spine 06/11/2019. FINDINGS: Technical note: Despite efforts by the technologist and patient, mild motion artifact is present on today's exam and could not be eliminated. This reduces exam sensitivity and specificity. Alignment: Stable straightening and mild convex right scoliosis. No focal angulation or listhesis. Skull base and vertebrae: No evidence of acute cervical spine fracture or traumatic subluxation. Soft tissues and spinal canal: No  prevertebral fluid or swelling. No visible canal hematoma. Disc levels: Similar multilevel spondylosis with disc space narrowing and uncinate spurring greatest at C5-6, C6-7 and C7-T1. No large disc herniation or high-grade spinal stenosis identified. There is up to mild spinal stenosis at multiple levels. There is moderate right-sided foraminal narrowing at C7-T1 which appears chronic. Upper chest: Grossly stable scarring at the lung apices. Other: Bilateral carotid atherosclerosis. Electronically Signed   By: Elsie Perone M.D.   On: 02/23/2024 12:43   US  Venous Img Lower Bilateral Result Date: 02/23/2024 CLINICAL DATA:  Bilateral lower extremity edema. Recent left toe amputation 02/05/2024. EXAM: BILATERAL LOWER EXTREMITY VENOUS DOPPLER ULTRASOUND TECHNIQUE: Gray-scale sonography with graded compression, as well as color Doppler and duplex ultrasound were performed to evaluate the lower extremity deep venous systems from the level of the common femoral vein and including the common femoral, femoral, profunda femoral, popliteal and calf veins including the posterior tibial, peroneal and gastrocnemius veins when visible. The superficial great saphenous vein was also interrogated. Spectral Doppler was utilized to evaluate flow at rest and with distal augmentation maneuvers in the common femoral, femoral and popliteal veins. COMPARISON:  None Available. FINDINGS: RIGHT LOWER EXTREMITY Common Femoral Vein: No evidence of thrombus. Normal compressibility, respiratory phasicity and response to augmentation. Saphenofemoral Junction: No evidence of thrombus. Normal compressibility and flow on color Doppler imaging. Profunda Femoral Vein: No evidence of thrombus. Normal compressibility and flow on color Doppler imaging. Femoral Vein: No evidence of thrombus. Normal compressibility, respiratory phasicity and response to augmentation. Popliteal Vein: No evidence of thrombus. Normal compressibility, respiratory  phasicity and response to augmentation. Calf Veins: No evidence of thrombus. Normal compressibility and flow on color Doppler imaging. Superficial Great Saphenous Vein: No evidence of thrombus. Normal compressibility. Venous Reflux:  None. Other Findings:  None. LEFT LOWER EXTREMITY Common Femoral Vein: No evidence of thrombus. Normal compressibility, respiratory phasicity and response to augmentation. Saphenofemoral Junction: No evidence of thrombus. Normal compressibility and flow on color Doppler imaging. Profunda Femoral Vein: No evidence of thrombus. Normal compressibility and flow on color Doppler imaging. Femoral Vein: No evidence of thrombus. Normal compressibility, respiratory phasicity and response to augmentation. Popliteal Vein: No evidence of thrombus. Normal compressibility, respiratory phasicity and response to augmentation. Calf Veins: No evidence of thrombus. Normal compressibility and flow on color Doppler imaging. Superficial Great Saphenous Vein: No evidence of thrombus. Normal compressibility. Venous Reflux:  None. Other Findings:  None. IMPRESSION: No evidence of deep venous thrombosis in either lower extremity. Electronically Signed   By: Toribio Agreste M.D.   On: 02/23/2024 11:58   DG Pelvis Portable Result Date: 02/23/2024 CLINICAL DATA:  Fall from wheelchair. EXAM: DG PORTABLE PELVIS COMPARISON:  10/07/2022 FINDINGS: No evidence of acute fracture or dislocation. Remainder of the exam is unchanged. IMPRESSION: No acute findings. Electronically Signed   By: Toribio Agreste M.D.   On: 02/23/2024 11:27   DG Chest Portable 1 View Result Date: 02/23/2024 CLINICAL DATA:  Fall from wheelchair.  Proxy. EXAM: PORTABLE CHEST 1 VIEW COMPARISON:  06/15/2023 FINDINGS: Patient rotated to the right. Lungs are adequately  inflated with minimal linear atelectasis right base. No lobar consolidation or effusion. Cardiomediastinal silhouette and remainder of the exam is unchanged. IMPRESSION: Minimal linear  atelectasis right base. Electronically Signed   By: Toribio Agreste M.D.   On: 02/23/2024 11:25   DG Foot Complete Left Result Date: 02/23/2024 CLINICAL DATA:  Possible foot infection. Fall from wheelchair. Recent left toe amputation. EXAM: LEFT FOOT - COMPLETE 3+ VIEW COMPARISON:  01/27/2024 FINDINGS: Evidence of previous first toe amputation distal to the medial cuneiform bone. Interval transmetatarsal amputation of the third metatarsal as the third phalanges remain in place. Tiny focal linear lucency along the medial distal cortex of the fourth metatarsal at the level of the third metatarsal amputation as this may be postsurgical. Mild degenerate changes over the midfoot and hindfoot. No bone destruction to suggest osteomyelitis. No air in the soft tissues. IMPRESSION: 1. No acute findings. 2. Interval transmetatarsal amputation of the third metatarsal as the third phalanges remain in place. 3. Previous first toe amputation distal to the medial cuneiform bone. Electronically Signed   By: Toribio Agreste M.D.   On: 02/23/2024 11:24   DG HIP UNILAT WITH PELVIS 2-3 VIEWS LEFT Result Date: 02/21/2024 EXAM: 2 OR MORE VIEW(S) XRAY OF THE UNILATERAL HIP 02/19/2024 01:05:00 PM COMPARISON: None available. CLINICAL HISTORY: pain FINDINGS: BONES AND JOINTS: No acute fracture or focal osseous lesion. The hip joint is maintained. No significant degenerative changes. SOFT TISSUES: The soft tissues are unremarkable. IMPRESSION: 1. No acute osseous abnormality of the hip. Electronically signed by: Oneil Devonshire MD 02/21/2024 03:00 AM EST RP Workstation: [id number]   US  ARTERIAL ABI (SCREENING LOWER EXTREMITY) Result Date: 02/04/2024 CLINICAL DATA:  Nonhealing wound left foot and history of diabetes. EXAM: NONINVASIVE PHYSIOLOGIC VASCULAR STUDY OF BILATERAL LOWER EXTREMITIES TECHNIQUE: Evaluation of both lower extremities were performed at rest, including calculation of ankle-brachial indices with single level Doppler,  pressure and pulse volume recording. COMPARISON:  Prior study on 03/26/2023 FINDINGS: Right ABI:  0.94.  Previously 1.09. Left ABI:  0.88.  Previously 1.03. Right Lower Extremity: Biphasic posterior tibial and monophasic dorsalis pedis waveforms. Left Lower Extremity: Biphasic posterior tibial and monophasic dorsalis pedis waveforms. 0.8-0.89 Mild PAD IMPRESSION: Slightly depressed bilateral resting ankle-brachial indices with some reduction compared to a prior study and further distal waveform abnormalities suggestive of some progression of peripheral vascular disease in both lower extremities. Electronically Signed   By: Marcey Moan M.D.   On: 02/04/2024 16:07   MR FOOT LEFT W WO CONTRAST Result Date: 02/04/2024 CLINICAL DATA:  Foot swelling, diabetic, osteomyelitis suspected, xray done EXAM: MRI OF THE LEFT FOREFOOT WITHOUT AND WITH CONTRAST TECHNIQUE: Multiplanar, multisequence MR imaging of the left forefoot was performed both before and after administration of intravenous contrast. CONTRAST:  10mL GADAVIST  GADOBUTROL  1 MMOL/ML IV SOLN COMPARISON:  Radiographs 02/04/2024 and 06/15/2023. Left foot MRI 03/25/2023. FINDINGS: Bones/Joint/Cartilage Since the previous MRI, the patient has undergone amputation through the 1st tarsometatarsal joint. There is new posterior dislocation at the 2nd metatarsophalangeal joint associated with chronic appearing erosions of the 2nd metatarsal head. No associated marrow edema to suggest active osteomyelitis. No significant joint effusions. The additional rays appear intact. Chronic mild midfoot degenerative changes with subchondral cyst formation laterally in the navicular and at the 3rd tarsometatarsal joint. No significant joint effusions. Ligaments Intact Lisfranc ligament. The collateral ligaments of the 3rd through 5th metatarsophalangeal joints appear intact. Muscles and Tendons Diffuse fatty atrophy throughout the forefoot musculature. No tendon tear or  significant tenosynovitis identified.  Soft tissues Expected postsurgical changes from previous 1st ray amputation. There is a questionable plantar forefoot wound extending from the head of the 3rd metatarsal into the 3rd web space, best seen on image 17/6. No focal fluid collection is seen in this area. The postcontrast images demonstrate decreased enhancement within the plantar forefoot soft tissues, a finding which suggests possible devitalized soft tissue. Nonspecific dorsal forefoot skin thickening and subcutaneous edema. IMPRESSION: 1. Interval amputation through the 1st tarsometatarsal joint. 2. New posterior dislocation at the 2nd metatarsophalangeal joint with chronic appearing erosions of the 2nd metatarsal head. No associated marrow edema to suggest active osteomyelitis. 3. Possible plantar forefoot wound extending from the head of the 3rd metatarsal into the 3rd web space. No focal fluid collection identified. 4. Decreased enhancement within the plantar forefoot soft tissues, suggesting possible devitalized soft tissue. 5. Nonspecific dorsal forefoot skin thickening and subcutaneous edema. Electronically Signed   By: Elsie Perone M.D.   On: 02/04/2024 15:26   DG Foot 2 Views Left Result Date: 02/04/2024 EXAM: 2 VIEW(S) XRAY OF THE LEFT FOOT 02/04/2024 01:09:00 PM COMPARISON: 06/15/2023 CLINICAL HISTORY: Left foot wound. Pt arrived via POV from the foot doctor for further evaluation of left diabetic foot ulcer between his 3rd and 4th toes. Pt reports he is a resident at [facility], and they do have a wound care nurse but his wound is not healing. Hx of diabetes. FINDINGS: BONES AND JOINTS: Status post amputation of the first ray as noted on prior study. Probable fusion of the talus and calcaneus. Probable lytic destruction of the distal portion of the second metatarsal of indeterminate age. No acute fracture. No joint dislocation. SOFT TISSUES: The soft tissues are unremarkable. IMPRESSION: 1.  Probable lytic destruction of the distal portion of the second metatarsal, indeterminate age, concerning for osteomyelitis . 2. Status post first ray amputation. Electronically signed by: Lynwood Seip MD 02/04/2024 01:37 PM EDT RP Workstation: [id number]    Microbiology: Results for orders placed or performed during the hospital encounter of 02/23/24  Culture, blood (Routine x 2)     Status: None (Preliminary result)   Collection Time: 02/23/24 10:20 AM   Specimen: Left Antecubital; Blood  Result Value Ref Range Status   Specimen Description   Final    LEFT ANTECUBITAL BOTTLES DRAWN AEROBIC AND ANAEROBIC   Special Requests Blood Culture adequate volume  Final   Culture   Final    NO GROWTH 2 DAYS Performed at [facility], [address] [place], [place] [address]    Report Status PENDING  Incomplete  Culture, blood (Routine x 2)     Status: None (Preliminary result)   Collection Time: 02/23/24 11:13 AM   Specimen: BLOOD  Result Value Ref Range Status   Specimen Description BLOOD BLOOD RIGHT ARM  Final   Special Requests NONE  Final   Culture   Final    NO GROWTH 2 DAYS Performed at [facility], [address], [place], [place] [address]    Report Status PENDING  Incomplete  Resp panel by RT-PCR (RSV, Flu A&B, Covid) Anterior Nasal Swab     Status: None   Collection Time: 02/23/24 11:23 AM   Specimen: Anterior Nasal Swab  Result Value Ref Range Status   SARS Coronavirus 2 by RT PCR NEGATIVE NEGATIVE Final    Comment: (NOTE) SARS-CoV-2 target nucleic acids are NOT DETECTED.  The SARS-CoV-2 RNA is generally detectable in upper respiratory specimens during the acute phase of infection. The lowest  concentration of SARS-CoV-2 viral copies this assay can detect is 138 copies/mL. A negative result does not preclude SARS-Cov-2 infection and should not be used as the sole basis for treatment or other patient management decisions. A negative result may occur with  improper specimen  collection/handling, submission of specimen other than nasopharyngeal swab, presence of viral mutation(s) within the areas targeted by this assay, and inadequate number of viral copies(<138 copies/mL). A negative result must be combined with clinical observations, patient history, and epidemiological information. The expected result is Negative.  Fact Sheet for Patients:  bloggercourse.com  Fact Sheet for Healthcare Providers:  seriousbroker.it  This test is no t yet approved or cleared by the [place]  FDA and  has been authorized for detection and/or diagnosis of SARS-CoV-2 by FDA under an Emergency Use Authorization (EUA). This EUA will remain  in effect (meaning this test can be used) for the duration of the COVID-19 declaration under Section 564(b)(1) of the Act, 21 U.S.C.section 360bbb-3(b)(1), unless the authorization is terminated  or revoked sooner.       Influenza A by PCR NEGATIVE NEGATIVE Final   Influenza B by PCR NEGATIVE NEGATIVE Final    Comment: (NOTE) The Xpert Xpress SARS-CoV-2/FLU/RSV plus assay is intended as an aid in the diagnosis of influenza from Nasopharyngeal swab specimens and should not be used as a sole basis for treatment. Nasal washings and aspirates are unacceptable for Xpert Xpress SARS-CoV-2/FLU/RSV testing.  Fact Sheet for Patients: bloggercourse.com  Fact Sheet for Healthcare Providers: seriousbroker.it  This test is not yet approved or cleared by the [place]  FDA and has been authorized for detection and/or diagnosis of SARS-CoV-2 by FDA under an Emergency Use Authorization (EUA). This EUA will remain in effect (meaning this test can be used) for the duration of the COVID-19 declaration under Section 564(b)(1) of the Act, 21 U.S.C. section 360bbb-3(b)(1), unless the authorization is terminated or revoked.     Resp Syncytial  Virus by PCR NEGATIVE NEGATIVE Final    Comment: (NOTE) Fact Sheet for Patients: bloggercourse.com  Fact Sheet for Healthcare Providers: seriousbroker.it  This test is not yet approved or cleared by the [place]  FDA and has been authorized for detection and/or diagnosis of SARS-CoV-2 by FDA under an Emergency Use Authorization (EUA). This EUA will remain in effect (meaning this test can be used) for the duration of the COVID-19 declaration under Section 564(b)(1) of the Act, 21 U.S.C. section 360bbb-3(b)(1), unless the authorization is terminated or revoked.  Performed at [facility], [address] [place], [place] [address]   Respiratory (~20 pathogens) panel by PCR     Status: None   Collection Time: 02/23/24  4:57 PM   Specimen: Nasopharyngeal Swab; Respiratory  Result Value Ref Range Status   Adenovirus NOT DETECTED NOT DETECTED Final   Coronavirus 229E NOT DETECTED NOT DETECTED Final    Comment: (NOTE) The Coronavirus on the Respiratory Panel, DOES NOT test for the novel  Coronavirus (2019 nCoV)    Coronavirus HKU1 NOT DETECTED NOT DETECTED Final   Coronavirus NL63 NOT DETECTED NOT DETECTED Final   Coronavirus OC43 NOT DETECTED NOT DETECTED Final   Metapneumovirus NOT DETECTED NOT DETECTED Final   Rhinovirus / Enterovirus NOT DETECTED NOT DETECTED Final   Influenza A NOT DETECTED NOT DETECTED Final   Influenza B NOT DETECTED NOT DETECTED Final   Parainfluenza Virus 1 NOT DETECTED NOT DETECTED Final   Parainfluenza Virus 2 NOT DETECTED NOT DETECTED Final   Parainfluenza Virus 3 NOT  DETECTED NOT DETECTED Final   Parainfluenza Virus 4 NOT DETECTED NOT DETECTED Final   Respiratory Syncytial Virus NOT DETECTED NOT DETECTED Final   Bordetella pertussis NOT DETECTED NOT DETECTED Final   Bordetella Parapertussis NOT DETECTED NOT DETECTED Final   Chlamydophila pneumoniae NOT DETECTED NOT DETECTED Final   Mycoplasma  pneumoniae NOT DETECTED NOT DETECTED Final    Comment: Performed at [facility], [address], [place], [place] [address]    Labs: CBC: Recent Labs  Lab 02/23/24 1021 02/24/24 0445  WBC 7.6 7.2  NEUTROABS 4.8  --   HGB 10.8* 10.8*  HCT 33.5* 34.7*  MCV 101.8* 102.4*  PLT 199 185   Basic Metabolic Panel: Recent Labs  Lab 02/23/24 1021 02/24/24 0445 02/25/24 0456  NA 136 140 141  K 4.3 4.6 4.6  CL 103 109 110  CO2 25 23 24   GLUCOSE 69* 53* 130*  BUN 47* 40* 35*  CREATININE 2.95* 2.37* 2.02*  CALCIUM  8.5* 8.2* 8.4*  MG  --   --  2.4   Liver Function Tests: Recent Labs  Lab 02/23/24 1021  AST 78*  ALT 58*  ALKPHOS 89  BILITOT 0.3  PROT 6.4*  ALBUMIN 3.2*   CBG: Recent Labs  Lab 02/24/24 1158 02/24/24 1629 02/24/24 1951 02/25/24 0738 02/25/24 1131  GLUCAP 189* 221* 289* 127* 135*    Discharge time spent: greater than 30 minutes.  Signed: Alm Schneider, MD [facility] 02/25/2024

## 2024-02-24 NOTE — Progress Notes (Signed)
 PROGRESS NOTE  Scott [name] FMW:[id number] DOB: 1960-12-21 DOA: 02/23/2024 PCP: Scott Benita Area, MD  Brief History:  63 year old male with history hypertension, diabetes mellitus type 2, hyperlipidemia, bipolar disorder, tobacco abuse (quit 1 yr ago), COPD, and TBI presenting from [facility] with mechanical fall.  Apparently the patient was standing using the bathroom when he sustained a fall.   He is somewhat of a difficult historian, but he is able to give basic information.  History is supplemented from review of the medical record and speaking with the EDP.  The patient denies syncope.  He denies hitting his head.  He feels that hisleft foot has been improving without any worsening erythema or edema.  He denies any worsening pain or drainage from his left foot.  Apparently, it was reported that the patient has been having more falls.  It was reported that the patient has been having more gait instability leading to the right.  The patient states that this has been a chronic issue which she relates to some pain.  However, his facility feels that he has been leaning more than usual in the past 24 hours.  He has had more falls. Notably, the patient himself denies any headache, chest pain, shortness breath, coughing, hemoptysis, pain.  Specifically, the patient does not feel that his left lower he has been any worse in terms of swelling or edema or erythema. Notably, the patient had a recent hospital admission from 01/27/2024 to 02/06/2024 for diabetic foot infection.  During that hospitalization, the patient underwent excision of the third metatarsal head on the left by Dr. MYRTIS Blanch.  He was discharged home with 10 additional days of doxycycline .   In the ED, the patient was afebrile .  He was initially hypotensive with a blood pressure of 78/50.  Stable with oxygen  saturation 86% on room air.  WBC 7.6, hemoglobin 10.8, platelet 199.  Sodium 136, potassium 4.3, bicarbonate 25,  serum creatinine 2.95.  AST 78, ALT 58, alk phosphatase 89, total bilirubin 0.3.  Lactic acid 1.7>> 1.3 UA 0-5WBC.  UDS was negative.  Alcohol  level is negative.  CT of the brain was negative for acute findings.  CT cervical spine negative for fracture or subluxation.  Ultrasound of his legs negative for DVT.  Chest x-ray showed right basilar atelectasis.  Left foot x-ray was negative for any acute findings.  Pelvis x-ray was negative for fractures.  The patient was given vancomycin , cefepime , metronidazole .  He was given 2 L LR.  He was admitted for further evaluation and treatment.   Assessment/Plan: Acute respiratory failure with hypoxia - Presented with oxygenation 86% on room air - likely due to hypoventilation from altered mental status - CT chest--no consolidation or edema - VQ scan--neg for PE - Personally reviewed chest x-ray--no consolidations or edema - Stable on 2 L - now stable on RA   Acute metabolic encephalopathy - Multifactorial including acute on chronic renal failure, possible elevated lithium  level, possible infectious process - B12--725 -TSH--1.240 -folic acid >20 - Lithium  level--0.98 (WNL) - VPA level--35 - UA negative for pyuria - CT brain negative for acute findings - No focal neurologic deficits on physical examination - 11/17--mental status back to baseline   COPD -Started DuoNebs -COVID-19 PCR negative/influenza--NEG -Check viral respiratory panel--pending -stable on RA   LEFT lower extremity edema -Good palpable pulses -Venous duplex negative for DVT -I have compared his current physical exam with photographs dated back  on 06/16/2023 and 06/17/2023>> -As such, his exam and comparison with photographs from 06/2023 do not suggest active or worsening infection   Acute on chronic renal failure CKD 3b -Baseline creatinine 1.6-1.9 -Presented with serum creatinine 2.95 -Continue IV fluids>>improving -Holding furosemide  - Check lithium  levels -In part  due to obstruction secondary to urinary retention -Foley catheter placed in the ED with over 1 L out   Uncontrolled diabetes mellitus type 2 with hyperglycemia -NovoLog  sliding scale -02/04/2024 hemoglobin A1c 7.5 -Holding glipizide  and tradjenta  -holding semglee  due to hypoglycemia at time of admission   Essential hypertension -Hold amlodipine  due to hypotension -hold metoprolol  due to hypotension -monitor clinically>>BP remains well controlled   Mixed hyperlipidemia -Continue statin   Bipolar disorder -Continue valproic acid ,  benztropine , respiradone - Holding lithium  temporarily>>restart      Family Communication:   no Family at bedside  Consultants:  none  Code Status:  FUL  DVT Prophylaxis:  [place] Heparin     Procedures: As Listed in Progress Note Above  Antibiotics: None       Subjective: Patient denies fevers, chills, headache, chest pain, dyspnea, nausea, vomiting, diarrhea, abdominal pain, dysuria, hematuria, hematochezia, and melena.   Objective: Vitals:   02/23/24 1708 02/23/24 2143 02/24/24 0103 02/24/24 0451  BP:  113/74 115/71 122/67  Pulse:  69 72 73  Resp:  17 17 17   Temp:  (!) 97.3 F (36.3 C) 97.7 F (36.5 C) 97.6 F (36.4 C)  TempSrc:  Oral Oral Oral  SpO2:  95% 98% 96%  Weight: 97.6 kg     Height: 6' 2 (1.88 m)       Intake/Output Summary (Last 24 hours) at 02/24/2024 1320 Last data filed at 02/24/2024 0600 Gross per 24 hour  Intake 640 ml  Output 3900 ml  Net -3260 ml   Weight change:  Exam:  General:  Pt is alert, follows commands appropriately, not in acute distress HEENT: No icterus, No thrush, No neck mass, [place]/AT Cardiovascular: RRR, S1/S2, no rubs, no gallops Respiratory: CTA bilaterally, no wheezing, no crackles, no rhonchi Abdomen: Soft/+BS, non tender, non distended, no guarding Extremities: 1 + LE edema, No lymphangitis, No petechiae, No rashes, no synovitis;  left foot without drainage or erythema   Data  Reviewed: I have personally reviewed following labs and imaging studies Basic Metabolic Panel: Recent Labs  Lab 02/23/24 1021 02/24/24 0445  NA 136 140  K 4.3 4.6  CL 103 109  CO2 25 23  GLUCOSE 69* 53*  BUN 47* 40*  CREATININE 2.95* 2.37*  CALCIUM  8.5* 8.2*   Liver Function Tests: Recent Labs  Lab 02/23/24 1021  AST 78*  ALT 58*  ALKPHOS 89  BILITOT 0.3  PROT 6.4*  ALBUMIN 3.2*   No results for input(s): LIPASE, AMYLASE in the last 168 hours. No results for input(s): AMMONIA in the last 168 hours. Coagulation Profile: Recent Labs  Lab 02/23/24 1021  INR 1.1   CBC: Recent Labs  Lab 02/23/24 1021 02/24/24 0445  WBC 7.6 7.2  NEUTROABS 4.8  --   HGB 10.8* 10.8*  HCT 33.5* 34.7*  MCV 101.8* 102.4*  PLT 199 185   Cardiac Enzymes: No results for input(s): CKTOTAL, CKMB, CKMBINDEX, TROPONINI in the last 168 hours. BNP: Invalid input(s): POCBNP CBG: Recent Labs  Lab 02/23/24 1354 02/23/24 1702 02/23/24 1927 02/24/24 0740 02/24/24 1158  GLUCAP 121* 145* 198* 71 189*   HbA1C: No results for input(s): HGBA1C in the last 72 hours. Urine analysis:  Component Value Date/Time   COLORURINE YELLOW 02/23/2024 1217   APPEARANCEUR CLEAR 02/23/2024 1217   APPEARANCEUR Clear 12/16/2023 1116   LABSPEC 1.009 02/23/2024 1217   PHURINE 6.0 02/23/2024 1217   GLUCOSEU NEGATIVE 02/23/2024 1217   HGBUR NEGATIVE 02/23/2024 1217   BILIRUBINUR NEGATIVE 02/23/2024 1217   BILIRUBINUR Negative 12/16/2023 1116   KETONESUR NEGATIVE 02/23/2024 1217   PROTEINUR 30 (A) 02/23/2024 1217   UROBILINOGEN 0.2 07/17/2011 2014   NITRITE NEGATIVE 02/23/2024 1217   LEUKOCYTESUR NEGATIVE 02/23/2024 1217   Sepsis Labs: @LABRCNTIP (procalcitonin:4,lacticidven:4) ) Recent Results (from the past 240 hours)  Culture, blood (Routine x 2)     Status: None (Preliminary result)   Collection Time: 02/23/24 10:20 AM   Specimen: Left Antecubital; Blood  Result Value Ref  Range Status   Specimen Description   Final    LEFT ANTECUBITAL BOTTLES DRAWN AEROBIC AND ANAEROBIC   Special Requests Blood Culture adequate volume  Final   Culture   Final    NO GROWTH < 24 HOURS Performed at [facility], [address] [place], [place] [address]    Report Status PENDING  Incomplete  Culture, blood (Routine x 2)     Status: None (Preliminary result)   Collection Time: 02/23/24 11:13 AM   Specimen: BLOOD  Result Value Ref Range Status   Specimen Description BLOOD BLOOD RIGHT ARM  Final   Special Requests NONE  Final   Culture   Final    NO GROWTH < 24 HOURS Performed at [facility], [address], [place], [place] [address]    Report Status PENDING  Incomplete  Resp panel by RT-PCR (RSV, Flu A&B, Covid) Anterior Nasal Swab     Status: None   Collection Time: 02/23/24 11:23 AM   Specimen: Anterior Nasal Swab  Result Value Ref Range Status   SARS Coronavirus 2 by RT PCR NEGATIVE NEGATIVE Final    Comment: (NOTE) SARS-CoV-2 target nucleic acids are NOT DETECTED.  The SARS-CoV-2 RNA is generally detectable in upper respiratory specimens during the acute phase of infection. The lowest concentration of SARS-CoV-2 viral copies this assay can detect is 138 copies/mL. A negative result does not preclude SARS-Cov-2 infection and should not be used as the sole basis for treatment or other patient management decisions. A negative result may occur with  improper specimen collection/handling, submission of specimen other than nasopharyngeal swab, presence of viral mutation(s) within the areas targeted by this assay, and inadequate number of viral copies(<138 copies/mL). A negative result must be combined with clinical observations, patient history, and epidemiological information. The expected result is Negative.  Fact Sheet for Patients:  bloggercourse.com  Fact Sheet for Healthcare Providers:   seriousbroker.it  This test is no t yet approved or cleared by the [place]  FDA and  has been authorized for detection and/or diagnosis of SARS-CoV-2 by FDA under an Emergency Use Authorization (EUA). This EUA will remain  in effect (meaning this test can be used) for the duration of the COVID-19 declaration under Section 564(b)(1) of the Act, 21 U.S.C.section 360bbb-3(b)(1), unless the authorization is terminated  or revoked sooner.       Influenza A by PCR NEGATIVE NEGATIVE Final   Influenza B by PCR NEGATIVE NEGATIVE Final    Comment: (NOTE) The Xpert Xpress SARS-CoV-2/FLU/RSV plus assay is intended as an aid in the diagnosis of influenza from Nasopharyngeal swab specimens and should not be used as a sole basis for treatment. Nasal washings and aspirates are unacceptable for Xpert Xpress  SARS-CoV-2/FLU/RSV testing.  Fact Sheet for Patients: bloggercourse.com  Fact Sheet for Healthcare Providers: seriousbroker.it  This test is not yet approved or cleared by the [place]  FDA and has been authorized for detection and/or diagnosis of SARS-CoV-2 by FDA under an Emergency Use Authorization (EUA). This EUA will remain in effect (meaning this test can be used) for the duration of the COVID-19 declaration under Section 564(b)(1) of the Act, 21 U.S.C. section 360bbb-3(b)(1), unless the authorization is terminated or revoked.     Resp Syncytial Virus by PCR NEGATIVE NEGATIVE Final    Comment: (NOTE) Fact Sheet for Patients: bloggercourse.com  Fact Sheet for Healthcare Providers: seriousbroker.it  This test is not yet approved or cleared by the [place]  FDA and has been authorized for detection and/or diagnosis of SARS-CoV-2 by FDA under an Emergency Use Authorization (EUA). This EUA will remain in effect (meaning this test can be used) for  the duration of the COVID-19 declaration under Section 564(b)(1) of the Act, 21 U.S.C. section 360bbb-3(b)(1), unless the authorization is terminated or revoked.  Performed at [facility], [address], [place], [place] [address]      Scheduled Meds:  atorvastatin   40 mg Oral Daily   benztropine   1 mg Oral BID   Chlorhexidine  Gluconate Cloth  6 each Topical Daily   divalproex   500-1,000 mg Oral BID   finasteride   5 mg Oral Daily   gabapentin   100 mg Oral QHS   heparin   5,000 Units Subcutaneous Q8H   insulin  aspart  0-5 Units Subcutaneous QHS   insulin  aspart  0-9 Units Subcutaneous TID WC   loratadine   10 mg Oral Daily   prednisoLONE acetate  1 drop Right Eye Daily   risperiDONE   2 mg Oral Daily   tamsulosin   0.4 mg Oral QPC supper   traZODone   50 mg Oral QHS   umeclidinium bromide   1 puff Inhalation Daily   zinc  sulfate (50mg  elemental zinc )  220 mg Oral Daily   Continuous Infusions:  lactated ringers  75 mL/hr at 02/24/24 1311    Procedures/Studies: CT CHEST WO CONTRAST Result Date: 02/23/2024 EXAM: CT CHEST WITHOUT CONTRAST 02/23/2024 08:03:14 PM TECHNIQUE: CT of the chest was performed without the administration of intravenous contrast. Multiplanar reformatted images are provided for review. Automated exposure control, iterative reconstruction, and/or weight based adjustment of the mA/kV was utilized to reduce the radiation dose to as low as reasonably achievable. COMPARISON: 09/11/2023. CLINICAL HISTORY: Respiratory illness, nondiagnostic xray. FINDINGS: MEDIASTINUM: Heart demonstrates coronary artery calcifications. Pericardium is unremarkable. The central airways are clear. LYMPH NODES: No mediastinal, hilar or axillary lymphadenopathy. LUNGS AND PLEURA: Linear dependent bibasilar subsegmental atelectasis or scarring. Emphysematous scarring right apex. No focal consolidation or pulmonary edema. No pleural effusion or pneumothorax. SOFT TISSUES/BONES: No acute abnormality of  the bones or soft tissues. UPPER ABDOMEN: Limited images of the upper abdomen demonstrates no acute abnormality. IMPRESSION: 1. No acute findings 2. Emphysema present; for patients 62 years old, consider evaluation for low-dose CT lung cancer screening program given emphysema as an independent risk factor Electronically signed by: Fonda Field MD 02/23/2024 08:18 PM EST RP Workstation: [id number]   NM Pulmonary Perfusion Result Date: 02/23/2024 CLINICAL DATA:  Possible pulmonary embolism. Recent fall with right-sided weakness. EXAM: NUCLEAR MEDICINE PERFUSION LUNG SCAN TECHNIQUE: Perfusion images were obtained in multiple projections after intravenous injection of radiopharmaceutical. Ventilation scans intentionally deferred if perfusion scan and chest x-ray adequate for interpretation during COVID 19 epidemic. RADIOPHARMACEUTICALS:  4.0 mCi Tc-65m MAA IV  COMPARISON:  Chest x-ray earlier today. FINDINGS: Examination demonstrates no focal peripheral wedge-shaped perfusion defects within either lung. Corresponding chest radiograph demonstrates minimal linear density right base likely atelectasis. Lungs otherwise clear. IMPRESSION: Normal perfusion lung scan without evidence of pulmonary emboli. Electronically Signed   By: Toribio Agreste M.D.   On: 02/23/2024 17:12   MR BRAIN WO CONTRAST Result Date: 02/23/2024 EXAM: MRI BRAIN WITHOUT CONTRAST 02/23/2024 03:54:55 PM TECHNIQUE: Multiplanar multisequence MRI of the head/brain was performed without the administration of intravenous contrast. COMPARISON: CT head from the same day. CLINICAL HISTORY: R sided weakness FINDINGS: BRAIN AND VENTRICLES: No acute infarct. No intracranial hemorrhage. No mass. No midline shift. No hydrocephalus. The sella is unremarkable. Normal flow voids. ORBITS: No acute abnormality. SINUSES AND MASTOIDS: No acute abnormality. BONES AND SOFT TISSUES: Normal marrow signal. No acute soft tissue abnormality. IMPRESSION: 1. No acute  intracranial abnormality. Electronically signed by: Gilmore Molt MD 02/23/2024 05:00 PM EST RP Workstation: [id number]   MR FOOT LEFT WO CONTRAST Result Date: 02/23/2024 CLINICAL DATA:  Recent amputation for osteomyelitis. EXAM: MRI OF THE LEFT FOOT WITHOUT CONTRAST TECHNIQUE: Multiplanar, multisequence MR imaging of the left forefoot was performed. No intravenous contrast was administered. COMPARISON:  Radiographs 02/23/2024 and 02/04/2024. MRI 02/04/2024 and 03/25/2023. FINDINGS: Bones/Joint/Cartilage Since the most recent prior MRI, the patient has undergone amputation through the distal 3rd metatarsal shaft. The phalanges of the 3rd toe remain. The amputation margin is sharp with mild nonspecific marrow edema in the distal metatarsal shaft. No progressive bone destruction identified. There is new mild T2 hyperintensity within the 4th metatarsal head without cortical destruction or T1 signal abnormality. Unchanged posterior dislocation at the 2nd metatarsophalangeal joint compared with the recent prior MRI. Unchanged postsurgical changes from previous amputation at the 1st tarsometatarsal joint. Stable mild chronic midfoot degenerative changes. No significant joint effusions. Ligaments Intact Lisfranc ligament. The collateral ligaments of the 4th and 5th metatarsophalangeal joints appear intact. Muscles and Tendons Generalized forefoot muscular atrophy. The forefoot tendons appear intact, without significant tenosynovitis. Soft tissues Residual small focus of skin ulceration along the plantar aspect of the resected 3rd metatarsal head. Underlying subcutaneous inflammation without residual focal fluid collection. No apparent skin ulceration over the 4th metatarsal head. Areas of low signal dorsal to the resected 3rd metatarsal head are likely postsurgical. No organized fluid collections are identified. IMPRESSION: 1. Interval amputation through the distal 3rd metatarsal shaft with mild nonspecific marrow  edema in the distal metatarsal shaft. No progressive bone destruction identified. 2. New mild T2 hyperintensity within the 4th metatarsal head without cortical destruction or T1 signal abnormality. This is nonspecific and could be reactive or secondary to early osteomyelitis. 3. Residual small focus of skin ulceration along the plantar aspect of the resected 3rd metatarsal head with underlying subcutaneous inflammation. No residual focal fluid collection. 4. Unchanged posterior dislocation at the 2nd metatarsophalangeal joint. Electronically Signed   By: Elsie Perone M.D.   On: 02/23/2024 16:49   CT Head Wo Contrast Result Date: 02/23/2024 EXAM: CT HEAD WITHOUT CONTRAST 02/23/2024 12:34:06 PM TECHNIQUE: CT of the head was performed without the administration of intravenous contrast. Automated exposure control, iterative reconstruction, and/or weight based adjustment of the mA/kV was utilized to reduce the radiation dose to as low as reasonably achievable. COMPARISON: CT head without contrast 11/22/2019. CLINICAL HISTORY: fall, R sided weakness FINDINGS: BRAIN AND VENTRICLES: No acute hemorrhage. No evidence of acute infarct. No hydrocephalus. No extra-axial collection. No mass effect or midline shift. ORBITS: No acute abnormality.  SINUSES: No acute abnormality. SOFT TISSUES AND SKULL: No acute soft tissue abnormality. No skull fracture. IMPRESSION: 1. No acute intracranial abnormality. Electronically signed by: Lonni Necessary MD 02/23/2024 12:44 PM EST RP Workstation: [id number]   CT Cervical Spine Wo Contrast Result Date: 02/23/2024 CLINICAL DATA:  Status post fall.  Right-sided weakness. EXAM: CT CERVICAL SPINE WITHOUT CONTRAST TECHNIQUE: Multidetector CT imaging of the cervical spine was performed without intravenous contrast. Multiplanar CT image reconstructions were also generated. RADIATION DOSE REDUCTION: This exam was performed according to the departmental dose-optimization program which  includes automated exposure control, adjustment of the mA and/or kV according to patient size and/or use of iterative reconstruction technique. COMPARISON:  CT cervical spine 06/11/2019. FINDINGS: Technical note: Despite efforts by the technologist and patient, mild motion artifact is present on today's exam and could not be eliminated. This reduces exam sensitivity and specificity. Alignment: Stable straightening and mild convex right scoliosis. No focal angulation or listhesis. Skull base and vertebrae: No evidence of acute cervical spine fracture or traumatic subluxation. Soft tissues and spinal canal: No prevertebral fluid or swelling. No visible canal hematoma. Disc levels: Similar multilevel spondylosis with disc space narrowing and uncinate spurring greatest at C5-6, C6-7 and C7-T1. No large disc herniation or high-grade spinal stenosis identified. There is up to mild spinal stenosis at multiple levels. There is moderate right-sided foraminal narrowing at C7-T1 which appears chronic. Upper chest: Grossly stable scarring at the lung apices. Other: Bilateral carotid atherosclerosis. Electronically Signed   By: Elsie Perone M.D.   On: 02/23/2024 12:43   US  Venous Img Lower Bilateral Result Date: 02/23/2024 CLINICAL DATA:  Bilateral lower extremity edema. Recent left toe amputation 02/05/2024. EXAM: BILATERAL LOWER EXTREMITY VENOUS DOPPLER ULTRASOUND TECHNIQUE: Gray-scale sonography with graded compression, as well as color Doppler and duplex ultrasound were performed to evaluate the lower extremity deep venous systems from the level of the common femoral vein and including the common femoral, femoral, profunda femoral, popliteal and calf veins including the posterior tibial, peroneal and gastrocnemius veins when visible. The superficial great saphenous vein was also interrogated. Spectral Doppler was utilized to evaluate flow at rest and with distal augmentation maneuvers in the common femoral, femoral  and popliteal veins. COMPARISON:  None Available. FINDINGS: RIGHT LOWER EXTREMITY Common Femoral Vein: No evidence of thrombus. Normal compressibility, respiratory phasicity and response to augmentation. Saphenofemoral Junction: No evidence of thrombus. Normal compressibility and flow on color Doppler imaging. Profunda Femoral Vein: No evidence of thrombus. Normal compressibility and flow on color Doppler imaging. Femoral Vein: No evidence of thrombus. Normal compressibility, respiratory phasicity and response to augmentation. Popliteal Vein: No evidence of thrombus. Normal compressibility, respiratory phasicity and response to augmentation. Calf Veins: No evidence of thrombus. Normal compressibility and flow on color Doppler imaging. Superficial Great Saphenous Vein: No evidence of thrombus. Normal compressibility. Venous Reflux:  None. Other Findings:  None. LEFT LOWER EXTREMITY Common Femoral Vein: No evidence of thrombus. Normal compressibility, respiratory phasicity and response to augmentation. Saphenofemoral Junction: No evidence of thrombus. Normal compressibility and flow on color Doppler imaging. Profunda Femoral Vein: No evidence of thrombus. Normal compressibility and flow on color Doppler imaging. Femoral Vein: No evidence of thrombus. Normal compressibility, respiratory phasicity and response to augmentation. Popliteal Vein: No evidence of thrombus. Normal compressibility, respiratory phasicity and response to augmentation. Calf Veins: No evidence of thrombus. Normal compressibility and flow on color Doppler imaging. Superficial Great Saphenous Vein: No evidence of thrombus. Normal compressibility. Venous Reflux:  None. Other Findings:  None. IMPRESSION:  No evidence of deep venous thrombosis in either lower extremity. Electronically Signed   By: Toribio Agreste M.D.   On: 02/23/2024 11:58   DG Pelvis Portable Result Date: 02/23/2024 CLINICAL DATA:  Fall from wheelchair. EXAM: DG PORTABLE PELVIS  COMPARISON:  10/07/2022 FINDINGS: No evidence of acute fracture or dislocation. Remainder of the exam is unchanged. IMPRESSION: No acute findings. Electronically Signed   By: Toribio Agreste M.D.   On: 02/23/2024 11:27   DG Chest Portable 1 View Result Date: 02/23/2024 CLINICAL DATA:  Fall from wheelchair.  Proxy. EXAM: PORTABLE CHEST 1 VIEW COMPARISON:  06/15/2023 FINDINGS: Patient rotated to the right. Lungs are adequately inflated with minimal linear atelectasis right base. No lobar consolidation or effusion. Cardiomediastinal silhouette and remainder of the exam is unchanged. IMPRESSION: Minimal linear atelectasis right base. Electronically Signed   By: Toribio Agreste M.D.   On: 02/23/2024 11:25   DG Foot Complete Left Result Date: 02/23/2024 CLINICAL DATA:  Possible foot infection. Fall from wheelchair. Recent left toe amputation. EXAM: LEFT FOOT - COMPLETE 3+ VIEW COMPARISON:  01/27/2024 FINDINGS: Evidence of previous first toe amputation distal to the medial cuneiform bone. Interval transmetatarsal amputation of the third metatarsal as the third phalanges remain in place. Tiny focal linear lucency along the medial distal cortex of the fourth metatarsal at the level of the third metatarsal amputation as this may be postsurgical. Mild degenerate changes over the midfoot and hindfoot. No bone destruction to suggest osteomyelitis. No air in the soft tissues. IMPRESSION: 1. No acute findings. 2. Interval transmetatarsal amputation of the third metatarsal as the third phalanges remain in place. 3. Previous first toe amputation distal to the medial cuneiform bone. Electronically Signed   By: Toribio Agreste M.D.   On: 02/23/2024 11:24   DG HIP UNILAT WITH PELVIS 2-3 VIEWS LEFT Result Date: 02/21/2024 EXAM: 2 OR MORE VIEW(S) XRAY OF THE UNILATERAL HIP 02/19/2024 01:05:00 PM COMPARISON: None available. CLINICAL HISTORY: pain FINDINGS: BONES AND JOINTS: No acute fracture or focal osseous lesion. The hip joint is  maintained. No significant degenerative changes. SOFT TISSUES: The soft tissues are unremarkable. IMPRESSION: 1. No acute osseous abnormality of the hip. Electronically signed by: Oneil Devonshire MD 02/21/2024 03:00 AM EST RP Workstation: [id number]   US  ARTERIAL ABI (SCREENING LOWER EXTREMITY) Result Date: 02/04/2024 CLINICAL DATA:  Nonhealing wound left foot and history of diabetes. EXAM: NONINVASIVE PHYSIOLOGIC VASCULAR STUDY OF BILATERAL LOWER EXTREMITIES TECHNIQUE: Evaluation of both lower extremities were performed at rest, including calculation of ankle-brachial indices with single level Doppler, pressure and pulse volume recording. COMPARISON:  Prior study on 03/26/2023 FINDINGS: Right ABI:  0.94.  Previously 1.09. Left ABI:  0.88.  Previously 1.03. Right Lower Extremity: Biphasic posterior tibial and monophasic dorsalis pedis waveforms. Left Lower Extremity: Biphasic posterior tibial and monophasic dorsalis pedis waveforms. 0.8-0.89 Mild PAD IMPRESSION: Slightly depressed bilateral resting ankle-brachial indices with some reduction compared to a prior study and further distal waveform abnormalities suggestive of some progression of peripheral vascular disease in both lower extremities. Electronically Signed   By: Marcey Moan M.D.   On: 02/04/2024 16:07   MR FOOT LEFT W WO CONTRAST Result Date: 02/04/2024 CLINICAL DATA:  Foot swelling, diabetic, osteomyelitis suspected, xray done EXAM: MRI OF THE LEFT FOREFOOT WITHOUT AND WITH CONTRAST TECHNIQUE: Multiplanar, multisequence MR imaging of the left forefoot was performed both before and after administration of intravenous contrast. CONTRAST:  10mL GADAVIST  GADOBUTROL  1 MMOL/ML IV SOLN COMPARISON:  Radiographs 02/04/2024 and 06/15/2023. Left foot  MRI 03/25/2023. FINDINGS: Bones/Joint/Cartilage Since the previous MRI, the patient has undergone amputation through the 1st tarsometatarsal joint. There is new posterior dislocation at the 2nd  metatarsophalangeal joint associated with chronic appearing erosions of the 2nd metatarsal head. No associated marrow edema to suggest active osteomyelitis. No significant joint effusions. The additional rays appear intact. Chronic mild midfoot degenerative changes with subchondral cyst formation laterally in the navicular and at the 3rd tarsometatarsal joint. No significant joint effusions. Ligaments Intact Lisfranc ligament. The collateral ligaments of the 3rd through 5th metatarsophalangeal joints appear intact. Muscles and Tendons Diffuse fatty atrophy throughout the forefoot musculature. No tendon tear or significant tenosynovitis identified. Soft tissues Expected postsurgical changes from previous 1st ray amputation. There is a questionable plantar forefoot wound extending from the head of the 3rd metatarsal into the 3rd web space, best seen on image 17/6. No focal fluid collection is seen in this [name]. The postcontrast images demonstrate decreased enhancement within the plantar forefoot soft tissues, a finding which suggests possible devitalized soft tissue. Nonspecific dorsal forefoot skin thickening and subcutaneous edema. IMPRESSION: 1. Interval amputation through the 1st tarsometatarsal joint. 2. New posterior dislocation at the 2nd metatarsophalangeal joint with chronic appearing erosions of the 2nd metatarsal head. No associated marrow edema to suggest active osteomyelitis. 3. Possible plantar forefoot wound extending from the head of the 3rd metatarsal into the 3rd web space. No focal fluid collection identified. 4. Decreased enhancement within the plantar forefoot soft tissues, suggesting possible devitalized soft tissue. 5. Nonspecific dorsal forefoot skin thickening and subcutaneous edema. Electronically Signed   By: Elsie Perone M.D.   On: 02/04/2024 15:26   DG Foot 2 Views Left Result Date: 02/04/2024 EXAM: 2 VIEW(S) XRAY OF THE LEFT FOOT 02/04/2024 01:09:00 PM COMPARISON: 06/15/2023  CLINICAL HISTORY: Left foot wound. Pt arrived via POV from the foot doctor for further evaluation of left diabetic foot ulcer between his 3rd and 4th toes. Pt reports he is a resident at [facility], and they do have a wound care nurse but his wound is not healing. Hx of diabetes. FINDINGS: BONES AND JOINTS: Status post amputation of the first ray as noted on prior study. Probable fusion of the talus and calcaneus. Probable lytic destruction of the distal portion of the second metatarsal of indeterminate age. No acute fracture. No joint dislocation. SOFT TISSUES: The soft tissues are unremarkable. IMPRESSION: 1. Probable lytic destruction of the distal portion of the second metatarsal, indeterminate age, concerning for osteomyelitis . 2. Status post first ray amputation. Electronically signed by: Lynwood Seip MD 02/04/2024 01:37 PM EDT RP Workstation: [id number]    Alm Schneider, DO  [facility]  If 7PM-7AM, please contact night-coverage [email or web address] Password [email or web address] 02/24/2024, 1:20 PM   LOS: 1 day

## 2024-02-25 DIAGNOSIS — R0902 Hypoxemia: Secondary | ICD-10-CM

## 2024-02-25 LAB — BASIC METABOLIC PANEL WITH GFR
Anion gap: 7 (ref 5–15)
BUN: 35 mg/dL — ABNORMAL HIGH (ref 8–23)
CO2: 24 mmol/L (ref 22–32)
Calcium: 8.4 mg/dL — ABNORMAL LOW (ref 8.9–10.3)
Chloride: 110 mmol/L (ref 98–111)
Creatinine, Ser: 2.02 mg/dL — ABNORMAL HIGH (ref 0.61–1.24)
GFR, Estimated: 37 mL/min — ABNORMAL LOW (ref >60)
Glucose, Bld: 130 mg/dL — ABNORMAL HIGH (ref 70–99)
Potassium: 4.6 mmol/L (ref 3.5–5.1)
Sodium: 141 mmol/L (ref 135–145)

## 2024-02-25 LAB — MAGNESIUM: Magnesium: 2.4 mg/dL (ref 1.7–2.4)

## 2024-02-25 LAB — GLUCOSE, CAPILLARY
Glucose-Capillary: 127 mg/dL — ABNORMAL HIGH (ref 70–99)
Glucose-Capillary: 135 mg/dL — ABNORMAL HIGH (ref 70–99)

## 2024-02-25 MED ORDER — BASAGLAR KWIKPEN 100 UNIT/ML ~~LOC~~ SOPN: 30.0000 [IU] | PEN_INJECTOR | Freq: Every day | SUBCUTANEOUS | 3 refills | Status: DC

## 2024-02-25 MED ORDER — BASAGLAR KWIKPEN 100 UNIT/ML ~~LOC~~ SOPN: 20.0000 [IU] | PEN_INJECTOR | Freq: Every day | SUBCUTANEOUS | Status: DC

## 2024-02-25 NOTE — Inpatient Diabetes Management (Signed)
 Inpatient Diabetes Program Recommendations  AACE/ADA: New Consensus Statement on Inpatient Glycemic Control   Target Ranges:  Prepandial:   less than 140 mg/dL      Peak postprandial:   less than 180 mg/dL (1-2 hours)      Critically ill patients:  140 - 180 mg/dL    Latest Reference Range & Units 02/23/24 11:14 02/23/24 13:36 02/23/24 13:54 02/23/24 17:02 02/23/24 19:27 02/24/24 07:40 02/24/24 11:58 02/24/24 16:29 02/24/24 19:51 02/25/24 07:38  Glucose-Capillary 70 - 99 mg/dL 74 48 (L) 878 (H) 854 (H) 198 (H) 71 189 (H) 221 (H) 289 (H) 127 (H)    Review of Glycemic Control  Diabetes history: DM2 Outpatient Diabetes medications: Novolog  5-11 units TID with meals, Basaglar  60 units QHS Current orders for Inpatient glycemic control: Novolog  0-9 units TID with meals, Novolog  0-5 units QHS  Inpatient Diabetes Program Recommendations:    Insulin : Patient initially hypoglycemic. CBG 127 mg/dl this morning; no basal insulin  given since arrival to hospital. If post prandial glucose is consistently over 180 mg/dl, consider ordering Novolog  3 units TID with meals for meal coverage if patient eats at least 50% of meals.  Outpatient DM: May need to consider decreasing Basaglar  insulin  dose at discharge given patient presented with hypoglycemia and not currently requiring any basal insulin .   Thanks, Earnie Gainer, RN, MSN, CDCES Diabetes Coordinator Inpatient Diabetes Program [phone] (Team Pager from 8am to 5pm)
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
## 2024-02-25 NOTE — TOC Transition Note (Signed)
 Transition of Care [facility]) - Discharge Note   Patient Details  Name: Scott [name] MRN: [id number] Date of Birth: 06-12-1960  [facility]) CM/SW Contact:  Lucie Lunger, LCSWA Phone Number: 02/25/2024, 1:46 PM   Clinical Narrative:    CSW updated that pt is medically stable for D/C back to [facility]F today. CSW sent D/C clinicals and Fl2 to facility for review. CSW spoke to [place] with facility who states they will provide transport for pt. CSW left secure VM for pts legal guardian providing update. CSW updated Sarah with [facility] that pt will D/C, MD placed [facility] PT/RN orders. RN updated on plan for D/C to ALF and facility will provide transport. TOC signing off.   Final next level of care: Assisted Living Barriers to Discharge: Barriers Resolved   Patient Goals and CMS Choice Patient states their goals for this hospitalization and ongoing recovery are:: return to ALF CMS Medicare.gov Compare Post Acute Care list provided to:: Legal Guardian Choice offered to / list presented to : [facility] POA / Guardian      Discharge Placement                Patient to be transferred to facility by: ALF staff Name of family member notified: LG Patient and family notified of of transfer: 02/25/24  Discharge Plan and Services Additional resources added to the After Visit Summary for   In-house Referral: Clinical Social Work Discharge Planning Services: CM Consult Post Acute Care Choice: Resumption of Svcs/PTA Provider                    HH Arranged: RN, PT HH Agency: [facility] Date [facility] Agency Contacted: 02/25/24   Representative spoke with at [facility] Agency: Lauraine  Social Drivers of Health (SDOH) Interventions SDOH Screenings   Food Insecurity: No Food Insecurity (02/23/2024)  Housing: Low Risk  (02/23/2024)  Transportation Needs: No Transportation Needs (02/23/2024)  Utilities: Not At Risk (02/23/2024)  Social Connections: Socially Isolated (02/23/2024)   Tobacco Use: Low Risk  (02/23/2024)     Readmission Risk Interventions    02/24/2024    1:01 PM 02/06/2024   11:12 AM 02/05/2024    1:23 PM  Readmission Risk Prevention Plan  Transportation Screening Complete Complete Complete  PCP or Specialist Appt within 3-5 Days  Complete   HRI or Home Care Consult  Complete Complete  Social Work Consult for Recovery Care Planning/Counseling  Complete Complete  Palliative Care Screening  Not Applicable Not Applicable  Medication Review Oceanographer) Complete Complete Complete  HRI or Home Care Consult Complete    SW Recovery Care/Counseling Consult Complete    Palliative Care Screening Not Applicable    Skilled Nursing Facility Not Applicable

## 2024-02-25 NOTE — Plan of Care (Signed)
  Problem: Education: Goal: Knowledge of General Education information will improve Description: Including pain rating scale, medication(s)/side effects and non-pharmacologic comfort measures Outcome: Progressing   Problem: Clinical Measurements: Goal: Ability to maintain clinical measurements within normal limits will improve Outcome: Progressing   Problem: Activity: Goal: Risk for activity intolerance will decrease Outcome: Progressing   Problem: Pain Managment: Goal: General experience of comfort will improve and/or be controlled Outcome: Progressing   Problem: Safety: Goal: Ability to remain free from injury will improve Outcome: Progressing   Problem: Metabolic: Goal: Ability to maintain appropriate glucose levels will improve Outcome: Progressing   Problem: Skin Integrity: Goal: Risk for impaired skin integrity will decrease Outcome: Progressing   Problem: Tissue Perfusion: Goal: Adequacy of tissue perfusion will improve Outcome: Progressing

## 2024-02-25 NOTE — Evaluation (Signed)
 Physical Therapy Evaluation Patient Details Name: Scott [name] MRN: [id number] DOB: 07-14-1960 Today's Date: 02/25/2024  History of Present Illness  Scott [name] is a 63 year old male with history hypertension, diabetes mellitus type 2, hyperlipidemia, bipolar disorder, tobacco abuse (quit 1 yr ago), COPD, and TBI presenting from [facility] with mechanical fall.  Apparently the patient was standing using the bathroom when he sustained a fall.    He is somewhat of a difficult historian, but he is able to give basic information.  History is supplemented from review of the medical record and speaking with the EDP.  The patient denies syncope.  He denies hitting his head.  He feels that hisleft foot has been improving without any worsening erythema or edema.  He denies any worsening pain or drainage from his left foot.  Apparently, it was reported that the patient has been having more falls.  It was reported that the patient has been having more gait instability leading to the right.  The patient states that this has been a chronic issue which she relates to some pain.  However, his facility feels that he has been leaning more than usual in the past 24 hours.  He has had more falls.  Notably, the patient himself denies any headache, chest pain, shortness breath, coughing, hemoptysis, pain.  Specifically, the patient does not feel that his left lower he has been any worse in terms of swelling or edema or erythema.  Notably, the patient had a recent hospital admission from 01/27/2024 to 02/06/2024 for diabetic foot infection.  During that hospitalization, the patient underwent excision of the third metatarsal head on the left by Dr. MYRTIS Blanch.  He was discharged home with 10 additional days of doxycycline .   Clinical Impression  Patient functioning near baseline for functional mobility and gait other than requiring use of RW due to very unsteady on feet when taking steps without AD with near loss of balance.  Patient demonstrates good return for using RW during ambulation in room and hallway without loss of balance. PLAN:  Patient to be discharged home today and discharged from acute physical therapy to care of nursing for ambulation as tolerated for length of stay with recommendations stated below      .    If plan is discharge home, recommend the following: A little help with walking and/or transfers;A little help with bathing/dressing/bathroom;Assist for transportation;Assistance with cooking/housework;Help with stairs or ramp for entrance   Can travel by private vehicle        Equipment Recommendations Rolling walker (2 wheels)  Recommendations for Other Services       Functional Status Assessment Patient has had a recent decline in their functional status and demonstrates the ability to make significant improvements in function in a reasonable and predictable amount of time.     Precautions / Restrictions Precautions Precautions: Fall Recall of Precautions/Restrictions: Intact Restrictions Weight Bearing Restrictions Per Provider Order: No      Mobility  Bed Mobility Overal bed mobility: Modified Independent                  Transfers Overall transfer level: Needs assistance Equipment used: None, Rolling walker (2 wheels) Transfers: Sit to/from Stand, Bed to chair/wheelchair/BSC Sit to Stand: Contact guard assist   Step pivot transfers: Contact guard assist, Min assist       General transfer comment: very unsteady with shaky movement, buckling of knees during transfers without AD, safer using RW    Ambulation/Gait Ambulation/Gait  assistance: Contact guard assist Gait Distance (Feet): 80 Feet Assistive device: Rolling walker (2 wheels) Gait Pattern/deviations: Decreased step length - left, Decreased stance time - right, Decreased stride length, Trunk flexed Gait velocity: decreased     General Gait Details: slightly labored movement with good return for  ambulating in room, hallway using RW without loss of balance  Stairs            Wheelchair Mobility     Tilt Bed    Modified Rankin (Stroke Patients Only)       Balance Overall balance assessment: Needs assistance Sitting-balance support: Feet supported, No upper extremity supported Sitting balance-Leahy Scale: Good Sitting balance - Comments: seated at EOB   Standing balance support: During functional activity, No upper extremity supported Standing balance-Leahy Scale: Poor Standing balance comment: fair/good using RW                             Pertinent Vitals/Pain Pain Assessment Pain Assessment: No/denies pain    Home Living Family/patient expects to be discharged to:: Assisted living                 Home Equipment: [place] - single point      Prior Function Prior Level of Function : Needs assist       Physical Assist : Mobility (physical) Mobility (physical): Transfers;Bed mobility;Gait;Stairs   Mobility Comments: Household ambulation with out AD ADLs Comments: Assisted by ALF staff     Extremity/Trunk Assessment   Upper Extremity Assessment Upper Extremity Assessment: Overall WFL for tasks assessed    Lower Extremity Assessment Lower Extremity Assessment: Generalized weakness    Cervical / Trunk Assessment Cervical / Trunk Assessment: Normal  Communication   Communication Communication: No apparent difficulties    Cognition Arousal: Alert Behavior During Therapy: WFL for tasks assessed/performed                             Following commands: Intact       Cueing Cueing Techniques: Verbal cues, Tactile cues     General Comments      Exercises     Assessment/Plan    PT Assessment All further PT needs can be met in the next venue of care  PT Problem List Decreased strength;Decreased activity tolerance;Decreased balance;Decreased mobility       PT Treatment Interventions      PT Goals (Current  goals can be found in the Care Plan section)  Acute Rehab PT Goals Patient Stated Goal: return home with ALF staff to assist PT Goal Formulation: With patient Time For Goal Achievement: 02/25/24 Potential to Achieve Goals: Good    Frequency       Co-evaluation               AM-PAC PT 6 Clicks Mobility  Outcome Measure Help needed turning from your back to your side while in a flat bed without using bedrails?: None Help needed moving from lying on your back to sitting on the side of a flat bed without using bedrails?: None Help needed moving to and from a bed to a chair (including a wheelchair)?: A Little Help needed standing up from a chair using your arms (e.g., wheelchair or bedside chair)?: A Little Help needed to walk in hospital room?: A Little Help needed climbing 3-5 steps with a railing? : A Lot 6 Click Score: 19    End  of Session   Activity Tolerance: Patient tolerated treatment well;Patient limited by fatigue Patient left: in chair;with call bell/phone within reach Nurse Communication: Mobility status PT Visit Diagnosis: Unsteadiness on feet (R26.81);Other abnormalities of gait and mobility (R26.89);Muscle weakness (generalized) (M62.81)    Time: 8891-8867 PT Time Calculation (min) (ACUTE ONLY): 24 min   Charges:   PT Evaluation $PT Eval Moderate Complexity: 1 Mod PT Treatments $Therapeutic Activity: 23-37 mins PT General Charges $$ ACUTE PT VISIT: 1 Visit         12:34 PM, 02/25/24 Lynwood Music, MPT Physical Therapist with [facility] [phone] office [phone] mobile phone

## 2024-02-26 ENCOUNTER — Telehealth: Payer: Self-pay | Admitting: Nurse Practitioner

## 2024-02-26 NOTE — Telephone Encounter (Signed)
 [facility] called and said that the hospital lowered insulin  but his sugar was 183 this morning. What do you want to do? He got 60 units last night.

## 2024-02-26 NOTE — Telephone Encounter (Signed)
 Follow what the hospital said for now.  If sugar is elevated in the next week, reach back out with readings and then I can better make adjustments.

## 2024-02-26 NOTE — Telephone Encounter (Signed)
 Spoke with Scott [name] and made aware

## 2024-02-27 DIAGNOSIS — Z87820 Personal history of traumatic brain injury: Secondary | ICD-10-CM | POA: Diagnosis not present

## 2024-02-27 DIAGNOSIS — F064 Anxiety disorder due to known physiological condition: Secondary | ICD-10-CM | POA: Diagnosis not present

## 2024-02-27 DIAGNOSIS — F5101 Primary insomnia: Secondary | ICD-10-CM | POA: Diagnosis not present

## 2024-02-27 DIAGNOSIS — F319 Bipolar disorder, unspecified: Secondary | ICD-10-CM | POA: Diagnosis not present

## 2024-02-27 LAB — MISC LABCORP TEST (SEND OUT): Labcorp test code: 83539

## 2024-02-28 LAB — CULTURE, BLOOD (ROUTINE X 2)
Culture: NO GROWTH
Culture: NO GROWTH
Special Requests: ADEQUATE

## 2024-03-02 DIAGNOSIS — W19XXXD Unspecified fall, subsequent encounter: Secondary | ICD-10-CM | POA: Diagnosis not present

## 2024-03-02 DIAGNOSIS — L97522 Non-pressure chronic ulcer of other part of left foot with fat layer exposed: Secondary | ICD-10-CM | POA: Diagnosis not present

## 2024-03-02 DIAGNOSIS — L03032 Cellulitis of left toe: Secondary | ICD-10-CM | POA: Diagnosis not present

## 2024-03-02 DIAGNOSIS — Z4889 Encounter for other specified surgical aftercare: Secondary | ICD-10-CM | POA: Diagnosis not present

## 2024-03-02 DIAGNOSIS — I10 Essential (primary) hypertension: Secondary | ICD-10-CM | POA: Diagnosis not present

## 2024-03-02 DIAGNOSIS — E11621 Type 2 diabetes mellitus with foot ulcer: Secondary | ICD-10-CM | POA: Diagnosis not present

## 2024-03-03 DIAGNOSIS — X32XXXD Exposure to sunlight, subsequent encounter: Secondary | ICD-10-CM | POA: Diagnosis not present

## 2024-03-03 DIAGNOSIS — Z1283 Encounter for screening for malignant neoplasm of skin: Secondary | ICD-10-CM | POA: Diagnosis not present

## 2024-03-03 DIAGNOSIS — Z08 Encounter for follow-up examination after completed treatment for malignant neoplasm: Secondary | ICD-10-CM | POA: Diagnosis not present

## 2024-03-03 DIAGNOSIS — Z85820 Personal history of malignant melanoma of skin: Secondary | ICD-10-CM | POA: Diagnosis not present

## 2024-03-03 DIAGNOSIS — L570 Actinic keratosis: Secondary | ICD-10-CM | POA: Diagnosis not present

## 2024-03-03 DIAGNOSIS — D225 Melanocytic nevi of trunk: Secondary | ICD-10-CM | POA: Diagnosis not present

## 2024-03-16 DIAGNOSIS — Z79899 Other long term (current) drug therapy: Secondary | ICD-10-CM | POA: Diagnosis not present

## 2024-03-16 DIAGNOSIS — Z4889 Encounter for other specified surgical aftercare: Secondary | ICD-10-CM | POA: Diagnosis not present

## 2024-04-27 ENCOUNTER — Ambulatory Visit (INDEPENDENT_AMBULATORY_CARE_PROVIDER_SITE_OTHER): Admitting: Nurse Practitioner

## 2024-04-27 ENCOUNTER — Encounter: Payer: Self-pay | Admitting: Nurse Practitioner

## 2024-04-27 VITALS — BP 110/74 | HR 64 | Ht 69.0 in

## 2024-04-27 DIAGNOSIS — Z794 Long term (current) use of insulin: Secondary | ICD-10-CM | POA: Diagnosis not present

## 2024-04-27 DIAGNOSIS — E782 Mixed hyperlipidemia: Secondary | ICD-10-CM | POA: Diagnosis not present

## 2024-04-27 DIAGNOSIS — Z7984 Long term (current) use of oral hypoglycemic drugs: Secondary | ICD-10-CM

## 2024-04-27 DIAGNOSIS — E1165 Type 2 diabetes mellitus with hyperglycemia: Secondary | ICD-10-CM | POA: Diagnosis not present

## 2024-04-27 DIAGNOSIS — I10 Essential (primary) hypertension: Secondary | ICD-10-CM

## 2024-04-27 DIAGNOSIS — E559 Vitamin D deficiency, unspecified: Secondary | ICD-10-CM

## 2024-04-27 MED ORDER — NOVOLOG FLEXPEN 100 UNIT/ML ~~LOC~~ SOPN: PEN_INJECTOR | SUBCUTANEOUS | 0 refills | Status: AC

## 2024-04-27 MED ORDER — BASAGLAR KWIKPEN 100 UNIT/ML ~~LOC~~ SOPN: 30.0000 [IU] | PEN_INJECTOR | Freq: Every day | SUBCUTANEOUS | 1 refills | Status: AC
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
## 2024-05-12 ENCOUNTER — Telehealth: Payer: Self-pay | Admitting: *Deleted

## 2024-05-14 ENCOUNTER — Telehealth: Payer: Self-pay

## 2024-05-14 NOTE — Progress Notes (Unsigned)
 Complex Care Management Note Care Guide Note  05/14/2024 Name: Scott [name] MRN: [id number] DOB: [birth year]   Complex Care Management Outreach Attempts: An unsuccessful telephone outreach was attempted today to offer the patient information about available complex care management services.  Follow Up Plan:  Additional outreach attempts will be made to offer the patient complex care management information and services.   Encounter Outcome:  No Answer-Call picked up and then hung up  Leotis Rase [facility], [facility] Guide  Direct Dial: [phone]  Fax [phone]

## 2024-06-15 ENCOUNTER — Ambulatory Visit: Admitting: Urology

## 2024-08-27 ENCOUNTER — Ambulatory Visit: Admitting: Nurse Practitioner
